# Patient Record
Sex: Female | Born: 1990 | Race: Black or African American | Hispanic: No | Marital: Single | Smoking: Former smoker
Health system: Southern US, Community
[De-identification: ages and names within clinical notes are randomized; demographics above are authoritative.]

## PROBLEM LIST (undated history)

## (undated) ENCOUNTER — Inpatient Hospital Stay: Payer: Self-pay

## (undated) DIAGNOSIS — Q2112 Patent foramen ovale: Secondary | ICD-10-CM

## (undated) DIAGNOSIS — G971 Other reaction to spinal and lumbar puncture: Secondary | ICD-10-CM

## (undated) DIAGNOSIS — Q211 Atrial septal defect, unspecified: Secondary | ICD-10-CM

## (undated) DIAGNOSIS — Q249 Congenital malformation of heart, unspecified: Secondary | ICD-10-CM

## (undated) DIAGNOSIS — K219 Gastro-esophageal reflux disease without esophagitis: Secondary | ICD-10-CM

## (undated) DIAGNOSIS — Z8679 Personal history of other diseases of the circulatory system: Secondary | ICD-10-CM

## (undated) DIAGNOSIS — F419 Anxiety disorder, unspecified: Secondary | ICD-10-CM

## (undated) DIAGNOSIS — I10 Essential (primary) hypertension: Secondary | ICD-10-CM

## (undated) DIAGNOSIS — IMO0001 Reserved for inherently not codable concepts without codable children: Secondary | ICD-10-CM

## (undated) HISTORY — DX: Patent foramen ovale: Q21.12

## (undated) HISTORY — PX: CHOLECYSTECTOMY: SHX55

## (undated) HISTORY — DX: Atrial septal defect: Q21.1

## (undated) HISTORY — PX: TUBAL LIGATION: SHX77

---

## 2013-04-30 ENCOUNTER — Emergency Department: Payer: Self-pay | Admitting: Emergency Medicine

## 2013-04-30 LAB — DRUG SCREEN, URINE
Amphetamines, Ur Screen: NEGATIVE (ref ?–1000)
Barbiturates, Ur Screen: NEGATIVE (ref ?–200)
Benzodiazepine, Ur Scrn: NEGATIVE (ref ?–200)
COCAINE METABOLITE, UR ~~LOC~~: NEGATIVE (ref ?–300)
Cannabinoid 50 Ng, Ur ~~LOC~~: POSITIVE (ref ?–50)
MDMA (Ecstasy)Ur Screen: NEGATIVE (ref ?–500)
Methadone, Ur Screen: NEGATIVE (ref ?–300)
OPIATE, UR SCREEN: NEGATIVE (ref ?–300)
Phencyclidine (PCP) Ur S: NEGATIVE (ref ?–25)
Tricyclic, Ur Screen: NEGATIVE (ref ?–1000)

## 2013-04-30 LAB — BASIC METABOLIC PANEL
ANION GAP: 7 (ref 7–16)
BUN: 13 mg/dL (ref 7–18)
CHLORIDE: 106 mmol/L (ref 98–107)
CREATININE: 1.16 mg/dL (ref 0.60–1.30)
Calcium, Total: 8.9 mg/dL (ref 8.5–10.1)
Co2: 25 mmol/L (ref 21–32)
EGFR (Non-African Amer.): >60
Glucose: 90 mg/dL (ref 65–99)
Osmolality: 275 (ref 275–301)
Potassium: 3.3 mmol/L — ABNORMAL LOW (ref 3.5–5.1)
Sodium: 138 mmol/L (ref 136–145)

## 2013-04-30 LAB — CBC
HCT: 36.4 % (ref 35.0–47.0)
HGB: 11.6 g/dL — ABNORMAL LOW (ref 12.0–16.0)
MCH: 25 pg — ABNORMAL LOW (ref 26.0–34.0)
MCHC: 31.9 g/dL — ABNORMAL LOW (ref 32.0–36.0)
MCV: 79 fL — ABNORMAL LOW (ref 80–100)
PLATELETS: 298 10*3/uL (ref 150–440)
RBC: 4.64 10*6/uL (ref 3.80–5.20)
RDW: 15.7 % — ABNORMAL HIGH (ref 11.5–14.5)
WBC: 9.3 10*3/uL (ref 3.6–11.0)

## 2013-04-30 LAB — ETHANOL: Ethanol: <3 mg/dL

## 2013-04-30 LAB — TROPONIN I

## 2013-06-13 ENCOUNTER — Emergency Department: Payer: Self-pay | Admitting: Emergency Medicine

## 2013-06-13 LAB — DRUG SCREEN, URINE

## 2013-06-23 ENCOUNTER — Emergency Department: Payer: Self-pay | Admitting: Emergency Medicine

## 2013-08-23 ENCOUNTER — Emergency Department: Payer: Self-pay | Admitting: Emergency Medicine

## 2013-08-23 LAB — WET PREP, GENITAL

## 2013-08-23 LAB — COMPREHENSIVE METABOLIC PANEL
ANION GAP: 7 (ref 7–16)
Albumin: 3.4 g/dL (ref 3.4–5.0)
Alkaline Phosphatase: 71 U/L
BUN: 13 mg/dL (ref 7–18)
Bilirubin,Total: 0.5 mg/dL (ref 0.2–1.0)
CALCIUM: 8.6 mg/dL (ref 8.5–10.1)
CREATININE: 0.67 mg/dL (ref 0.60–1.30)
Chloride: 106 mmol/L (ref 98–107)
Co2: 25 mmol/L (ref 21–32)
EGFR (Non-African Amer.): >60
Glucose: 114 mg/dL — ABNORMAL HIGH (ref 65–99)
OSMOLALITY: 277 (ref 275–301)
Potassium: 3.3 mmol/L — ABNORMAL LOW (ref 3.5–5.1)
SGOT(AST): 16 U/L (ref 15–37)
SGPT (ALT): 23 U/L (ref 12–78)
Sodium: 138 mmol/L (ref 136–145)
Total Protein: 7.5 g/dL (ref 6.4–8.2)

## 2013-08-23 LAB — URINALYSIS, COMPLETE
Bacteria: NONE SEEN
Bilirubin,UR: NEGATIVE
Blood: NEGATIVE
Glucose,UR: NEGATIVE mg/dL (ref 0–75)
Ketone: NEGATIVE
Nitrite: NEGATIVE
Ph: 5 (ref 4.5–8.0)
Protein: NEGATIVE
RBC, UR: NONE SEEN /HPF (ref 0–5)
SPECIFIC GRAVITY: 1.032 (ref 1.003–1.030)
Squamous Epithelial: 3
WBC UR: 1 /HPF (ref 0–5)

## 2013-08-23 LAB — CBC
HCT: 35.8 % (ref 35.0–47.0)
HGB: 11.7 g/dL — AB (ref 12.0–16.0)
MCH: 25.3 pg — ABNORMAL LOW (ref 26.0–34.0)
MCHC: 32.5 g/dL (ref 32.0–36.0)
MCV: 78 fL — ABNORMAL LOW (ref 80–100)
PLATELETS: 289 10*3/uL (ref 150–440)
RBC: 4.6 10*6/uL (ref 3.80–5.20)
RDW: 15.2 % — ABNORMAL HIGH (ref 11.5–14.5)
WBC: 9.4 10*3/uL (ref 3.6–11.0)

## 2013-08-23 LAB — GC/CHLAMYDIA PROBE AMP

## 2014-02-13 ENCOUNTER — Emergency Department: Payer: Self-pay | Admitting: Emergency Medicine

## 2014-04-07 ENCOUNTER — Emergency Department: Payer: Self-pay | Admitting: Emergency Medicine

## 2014-07-18 ENCOUNTER — Emergency Department
Admit: 2014-07-18 | Disposition: A | Discharge status: Home / Self Care | Payer: Self-pay | Admitting: Emergency Medicine

## 2014-07-18 LAB — URINALYSIS, COMPLETE
BILIRUBIN, UR: NEGATIVE
Blood: NEGATIVE
Glucose,UR: NEGATIVE mg/dL (ref 0–75)
Ketone: NEGATIVE
Leukocyte Esterase: NEGATIVE
NITRITE: NEGATIVE
Ph: 5 (ref 4.5–8.0)
Protein: NEGATIVE
RBC, UR: NONE SEEN /HPF (ref 0–5)
Specific Gravity: 1.017 (ref 1.003–1.030)

## 2014-07-18 LAB — CBC WITH DIFFERENTIAL/PLATELET
BASOS PCT: 0.3 %
Basophil #: 0 10*3/uL (ref 0.0–0.1)
EOS ABS: 0 10*3/uL (ref 0.0–0.7)
Eosinophil %: 0.4 %
HCT: 38.4 % (ref 35.0–47.0)
HGB: 12.5 g/dL (ref 12.0–16.0)
LYMPHS ABS: 2.4 10*3/uL (ref 1.0–3.6)
LYMPHS PCT: 25.9 %
MCH: 25.9 pg — ABNORMAL LOW (ref 26.0–34.0)
MCHC: 32.7 g/dL (ref 32.0–36.0)
MCV: 79 fL — AB (ref 80–100)
Monocyte #: 0.9 x10 3/mm (ref 0.2–0.9)
Monocyte %: 9.7 %
Neutrophil #: 5.9 10*3/uL (ref 1.4–6.5)
Neutrophil %: 63.7 %
Platelet: 275 10*3/uL (ref 150–440)
RBC: 4.84 10*6/uL (ref 3.80–5.20)
RDW: 15.6 % — ABNORMAL HIGH (ref 11.5–14.5)
WBC: 9.3 10*3/uL (ref 3.6–11.0)

## 2014-07-18 LAB — HCG, QUANTITATIVE, PREGNANCY: Beta Hcg, Quant.: 30052 m[IU]/mL — ABNORMAL HIGH

## 2014-07-21 LAB — URINE CULTURE

## 2014-07-23 ENCOUNTER — Other Ambulatory Visit: Payer: Self-pay | Admitting: Family Medicine

## 2014-07-23 DIAGNOSIS — Z369 Encounter for antenatal screening, unspecified: Secondary | ICD-10-CM

## 2014-07-23 DIAGNOSIS — I517 Cardiomegaly: Secondary | ICD-10-CM

## 2014-07-23 LAB — OB RESULTS CONSOLE VARICELLA ZOSTER ANTIBODY, IGG: VARICELLA IGG: NON-IMMUNE/NOT IMMUNE

## 2014-07-23 LAB — OB RESULTS CONSOLE HEPATITIS B SURFACE ANTIGEN: Hepatitis B Surface Ag: NEGATIVE

## 2014-07-23 LAB — OB RESULTS CONSOLE RPR: RPR: NONREACTIVE

## 2014-07-23 LAB — OB RESULTS CONSOLE RUBELLA ANTIBODY, IGM: RUBELLA: IMMUNE

## 2014-07-29 DIAGNOSIS — Z91410 Personal history of adult physical and sexual abuse: Secondary | ICD-10-CM | POA: Insufficient documentation

## 2014-08-12 ENCOUNTER — Emergency Department: Payer: Medicaid Other

## 2014-08-12 ENCOUNTER — Emergency Department
Admission: EM | Admit: 2014-08-12 | Discharge: 2014-08-12 | Disposition: A | Discharge status: Home / Self Care | Payer: Medicaid Other | Attending: Emergency Medicine | Admitting: Emergency Medicine

## 2014-08-12 DIAGNOSIS — Z3A10 10 weeks gestation of pregnancy: Secondary | ICD-10-CM | POA: Diagnosis not present

## 2014-08-12 DIAGNOSIS — O98511 Other viral diseases complicating pregnancy, first trimester: Secondary | ICD-10-CM | POA: Insufficient documentation

## 2014-08-12 DIAGNOSIS — O9989 Other specified diseases and conditions complicating pregnancy, childbirth and the puerperium: Secondary | ICD-10-CM | POA: Insufficient documentation

## 2014-08-12 DIAGNOSIS — B349 Viral infection, unspecified: Secondary | ICD-10-CM | POA: Insufficient documentation

## 2014-08-12 DIAGNOSIS — O26899 Other specified pregnancy related conditions, unspecified trimester: Secondary | ICD-10-CM

## 2014-08-12 DIAGNOSIS — R109 Unspecified abdominal pain: Secondary | ICD-10-CM

## 2014-08-12 LAB — CBC WITH DIFFERENTIAL/PLATELET
BASOS PCT: 0 %
Basophils Absolute: 0 10*3/uL (ref 0–0.1)
EOS ABS: 0 10*3/uL (ref 0–0.7)
EOS PCT: 0 %
HCT: 40.3 % (ref 35.0–47.0)
Hemoglobin: 13 g/dL (ref 12.0–16.0)
Lymphocytes Relative: 19 %
Lymphs Abs: 1.9 10*3/uL (ref 1.0–3.6)
MCH: 26 pg (ref 26.0–34.0)
MCHC: 32.3 g/dL (ref 32.0–36.0)
MCV: 80.4 fL (ref 80.0–100.0)
Monocytes Absolute: 0.8 10*3/uL (ref 0.2–0.9)
Monocytes Relative: 9 %
Neutro Abs: 7.1 10*3/uL — ABNORMAL HIGH (ref 1.4–6.5)
Neutrophils Relative %: 72 %
Platelets: 290 10*3/uL (ref 150–440)
RBC: 5.01 MIL/uL (ref 3.80–5.20)
RDW: 15.3 % — AB (ref 11.5–14.5)
WBC: 9.9 10*3/uL (ref 3.6–11.0)

## 2014-08-12 LAB — URINALYSIS COMPLETE WITH MICROSCOPIC (ARMC ONLY)
Bacteria, UA: NONE SEEN
Bilirubin Urine: NEGATIVE
GLUCOSE, UA: NEGATIVE mg/dL
Hgb urine dipstick: NEGATIVE
Ketones, ur: NEGATIVE mg/dL
Leukocytes, UA: NEGATIVE
Nitrite: NEGATIVE
PH: 5 (ref 5.0–8.0)
Protein, ur: NEGATIVE mg/dL
Specific Gravity, Urine: 1.018 (ref 1.005–1.030)

## 2014-08-12 LAB — BASIC METABOLIC PANEL
Anion gap: 8 (ref 5–15)
BUN: 6 mg/dL (ref 6–20)
CALCIUM: 9.1 mg/dL (ref 8.9–10.3)
CO2: 24 mmol/L (ref 22–32)
CREATININE: 0.56 mg/dL (ref 0.44–1.00)
Chloride: 101 mmol/L (ref 101–111)
GFR calc Af Amer: >60 mL/min (ref >60)
GLUCOSE: 99 mg/dL (ref 65–99)
POTASSIUM: 3.8 mmol/L (ref 3.5–5.1)
Sodium: 133 mmol/L — ABNORMAL LOW (ref 135–145)

## 2014-08-12 LAB — CHLAMYDIA/NGC RT PCR (ARMC ONLY)
Chlamydia Tr: NOT DETECTED
N GONORRHOEAE: NOT DETECTED

## 2014-08-12 LAB — WET PREP, GENITAL
Clue Cells Wet Prep HPF POC: NONE SEEN
Trich, Wet Prep: NONE SEEN
Yeast Wet Prep HPF POC: NONE SEEN

## 2014-08-12 LAB — HCG, QUANTITATIVE, PREGNANCY: hCG, Beta Chain, Quant, S: 121381 m[IU]/mL — ABNORMAL HIGH (ref <5)

## 2014-08-12 MED ORDER — VITAMIN B-6 50 MG PO TABS: 75.0000 mg | ORAL_TABLET | Freq: Every day | ORAL | Status: DC

## 2014-08-12 MED ORDER — ACETAMINOPHEN 325 MG PO TABS
650.0000 mg | ORAL_TABLET | Freq: Once | ORAL | Status: AC
  Administered 2014-08-12: 650 mg via ORAL

## 2014-08-12 MED ORDER — METOCLOPRAMIDE HCL 10 MG PO TABS
ORAL_TABLET | ORAL | Status: AC
  Administered 2014-08-12: 10 mg via ORAL
  Filled 2014-08-12: qty 1

## 2014-08-12 MED ORDER — METOCLOPRAMIDE HCL 10 MG PO TABS
10.0000 mg | ORAL_TABLET | Freq: Once | ORAL | Status: AC
  Administered 2014-08-12: 10 mg via ORAL
  Filled 2014-08-12: qty 1

## 2014-08-12 MED ORDER — ACETAMINOPHEN 325 MG PO TABS
ORAL_TABLET | ORAL | Status: AC
  Administered 2014-08-12: 650 mg via ORAL
  Filled 2014-08-12: qty 2

## 2014-08-12 NOTE — ED Notes (Signed)
Pt ambulatory to lobby. NAD. 

## 2014-08-12 NOTE — ED Notes (Signed)
C/o 3 days of diarrhea, is 10 weeks preg, nausea and vomiting daily with pregnancy.

## 2014-08-12 NOTE — ED Provider Notes (Signed)
[facility] Emergency Department Provider Note    ____________________________________________  Time seen: 2:14 PM.   I have reviewed the triage vital signs and the nursing notes.   HISTORY  Chief Complaint Headache and Abdominal Pain       HPI Ashley Howell is a 24 y.o. female at 7010 weeks' gestation emergency with lower abdominal pain for 3 days. She said that she has had associated nausea and vomiting. There is no blood in the vomitus. She's also had several episodes of diarrhea. She says that she has had about 3 episodes of diarrhea per day there is no recent travel history or antibiotic use. There is also no blood in the diarrhea. She describes it as watery. She describes her abdominal pain as lower, cramping and sharp and a 7 out of 10. She denies any vaginal bleeding or discharge. She denies any pain with urination. She denies any sick contacts. 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 She is also describing rhinorrhea as well as a left-sided headache which she describes as throbbing and is located frontal and left. There is associated photophobia. She denies any history of migraine. Denies ear pressure, fever or throat pain. There is nothing that makes the pain better or worse but the pain in both the head and the belly come in waves. Patient is a G3 P2.       No past medical history on file.  There are no active problems to display for this patient.   No past surgical history on file.  No current outpatient prescriptions on file.  Allergies Review of patient's allergies indicates no known allergies.  No family history on file.  Social History History  Substance Use Topics  . Smoking status: Not on file  . Smokeless tobacco: Not on file  . Alcohol Use: Not on file    Review of Systems  Constitutional: Negative for fever. Eyes: Negative for visual changes. ENT: Negative for sore throat. Positive  for rhinorrhea and frontal left-sided headache. Cardiovascular: Negative for chest pain. Respiratory: Negative for shortness of breath. Gastrointestinal: Lower bilateral abdominal pain with nausea vomiting and diarrhea.  Genitourinary: Negative for dysuria. Musculoskeletal: Low back pain bilaterally Skin: Negative for rash. Neurological: Negative for headaches, focal weakness or numbness.  10-point ROS otherwise negative.  ____________________________________________   PHYSICAL EXAM:  VITAL SIGNS: ED Triage Vitals  Enc Vitals Group     BP 08/12/14 1343 127/72 mmHg     Pulse Rate 08/12/14 1343 86     Resp 08/12/14 1343 18     Temp 08/12/14 1343 98.2 F (36.8 C)     Temp Source 08/12/14 1343 Oral     SpO2 08/12/14 1343 99 %     Weight 08/12/14 1343 230 lb (104.327 kg)     Height 08/12/14 1343 5\' 7"  (1.702 m)     Head Cir --      Peak Flow --      Pain Score 08/12/14 1343 7     Pain Loc --      Pain Edu? --      Excl. in GC? --      Constitutional: Alert and oriented. Well appearing and in no distress. Eyes: Conjunctivae are normal. PERRL. Normal extraocular movements. ENT   Head: Normocephalic and atraumatic. No tenderness to palpation over the sinuses.   Nose: With bilateral erythema to the nasal mucosa with a mild amount of rhinorrhea.   Mouth/Throat: Mucous  membranes are moist.   Neck: No stridor. Hematological/Lymphatic/Immunilogical: No cervical lymphadenopathy. Cardiovascular: Normal rate, regular rhythm. Normal and symmetric distal pulses are present in all extremities. No murmurs, rubs, or gallops. Respiratory: Normal respiratory effort without tachypnea nor retractions. Breath sounds are clear and equal bilaterally. No wheezes/rales/rhonchi. Gastrointestinal: No distention. No abdominal bruits. There is no CVA tenderness. Mild tenderness to the lower abdomen suprapubic and bilaterally. There is no rebound or guarding. The belly is soft. Genitourinary:  There is a normal external exam. Very scant cervical mucus on the speculum exam. Bimanual exam with a closed cervix, no CMT, no uterine or adnexal tenderness bilaterally or masses bilaterally. Musculoskeletal: Nontender with normal range of motion in all extremities. No joint effusions.  No lower extremity tenderness nor edema. Mild tenderness to the lower lumbar area diffusely. No step-off or deformity to the spine. Neurologic:  Normal speech and language. No gross focal neurologic deficits are appreciated. Speech is normal. No gait instability. Skin:  Skin is warm, dry and intact. No rash noted. Psychiatric: Mood and affect are normal. Speech and behavior are normal. Patient exhibits appropriate insight and judgment.  ____________________________________________    LABS (pertinent positives/negatives)  The patient has a normal white count. This is reassuring for appendicitis. No sign of UTI on UA.  ____________________________________________   EKG    ____________________________________________    RADIOLOGY  ED ultrasound with IUP single and live.    ____________________________________________   PROCEDURES    ____________________________________________   INITIAL IMPRESSION / ASSESSMENT AND PLAN / ED COURSE  Pertinent labs & imaging results that were available during my care of the patient were reviewed by me and considered in my medical decision making (see chart for details).  We'll review patient's ultrasound and vaginal swab. Impression is that the patient has a viral syndrome with sinus pressure and congestion causing her headache. Patient also says that her vomiting has been consistent since the beginning of her pregnancy. It is likely there is some component of the viral syndrome concerning to her vomiting. However, I definitely feel of the viral syndrome is concerning to her diarrhea. Although there is no obvious sick contact, the patient has multiple risk  factors including small children at home as well as work in healthcare setting. Doubt that the patient has flu given no fever or diffuse body aches. Patient is also outside the window for Tamiflu. Doubt appendicitis given the patient has bilateral lower abdominal pain with a normal white count.  ----------------------------------------- 4:23 PM on 08/12/2014 -----------------------------------------  Patient's symptoms are improved with medication. Patient has an appointment to follow up with [facility]. Will DC home with vitamin B6 for nausea and vomiting. The patient was counseled to continue her prenatal vitamins. She was also counseled to use Tylenol\acetaminophen and only Tylenol/acetaminophen for pain.  I believe that this constellation of symptoms were present a viral syndrome. The patient will be discharged to home.  ____________________________________________   FINAL CLINICAL IMPRESSION(S) / ED DIAGNOSES  Viral syndrome. Abdominal pain and pregnancy. Acute common initial visit.   Arelia Longestavid M Schaevitz, MD 08/12/14 [phone]

## 2014-08-12 NOTE — Discharge Instructions (Signed)

## 2014-08-26 ENCOUNTER — Ambulatory Visit (HOSPITAL_BASED_OUTPATIENT_CLINIC_OR_DEPARTMENT_OTHER)
Admission: RE | Admit: 2014-08-26 | Discharge: 2014-08-26 | Disposition: A | Discharge status: Home / Self Care | Payer: Medicaid Other | Source: Ambulatory Visit | Attending: Obstetrics and Gynecology | Admitting: Obstetrics and Gynecology

## 2014-08-26 ENCOUNTER — Other Ambulatory Visit: Payer: Self-pay | Admitting: Obstetrics and Gynecology

## 2014-08-26 ENCOUNTER — Ambulatory Visit
Admission: RE | Admit: 2014-08-26 | Discharge: 2014-08-26 | Disposition: A | Discharge status: Home / Self Care | Payer: Medicaid Other | Source: Ambulatory Visit | Attending: Family Medicine | Admitting: Family Medicine

## 2014-08-26 VITALS — BP 127/67 | HR 84 | Temp 98.6°F | Ht 67.0 in | Wt 253.0 lb

## 2014-08-26 DIAGNOSIS — O99211 Obesity complicating pregnancy, first trimester: Secondary | ICD-10-CM

## 2014-08-26 DIAGNOSIS — Z36 Encounter for antenatal screening of mother: Secondary | ICD-10-CM | POA: Diagnosis present

## 2014-08-26 DIAGNOSIS — Z3A12 12 weeks gestation of pregnancy: Secondary | ICD-10-CM | POA: Diagnosis not present

## 2014-08-26 DIAGNOSIS — Z369 Encounter for antenatal screening, unspecified: Secondary | ICD-10-CM

## 2014-08-26 NOTE — Progress Notes (Signed)
Clinic, General Medical Length of Consultation: 40 minutes   [name]  was referred to [facility] for genetic counseling to review prenatal screening and testing options.  This note summarizes the information we discussed.    First trimester screening, which can include nuchal translucency ultrasound screen and/or first trimester maternal serum marker screening.  The nuchal translucency has approximately an 80% detection rate for Down syndrome and can be positive for other chromosome abnormalities as well as congenital heart defects.  When combined with a maternal serum marker screening, the detection rate is up to 90% for Down syndrome and up to 97% for trisomy 18.     Maternal serum marker screening, a blood test that measures pregnancy proteins, can provide risk assessments for Down syndrome, trisomy 18, and open neural tube defects (spina bifida, anencephaly). Because it does not directly examine the fetus, it cannot positively diagnose or rule out these problems.  Targeted ultrasound uses high frequency sound waves to create an image of the developing fetus.  An ultrasound is often recommended as a routine means of evaluating the pregnancy.  It is also used to screen for fetal anatomy problems (for example, a heart defect) that might be suggestive of a chromosomal or other abnormality.   Should these screening tests indicate an increased concern, then the following diagnostic options would be offered:  The chorionic villus sampling procedure is available for first trimester chromosome analysis.  This involves the withdrawal of a small amount of chorionic villi (tissue from the developing placenta).  Risk of pregnancy loss is estimated to be approximately 1 in 200 to 1 in 100 (0.5 to 1%).  There is approximately a 1% (1 in 100) chance that the CVS chromosome results will be unclear.  Chorionic villi cannot be tested for neural tube defects.     Amniocentesis involves the  removal of a small amount of amniotic fluid from the sac surrounding the fetus with the use of a thin needle inserted through the maternal abdomen and uterus.  Ultrasound guidance is used throughout the procedure.  Fetal cells from amniotic fluid are directly evaluated and > 99.5% of chromosome problems and > 98% of open neural tube defects can be detected. This procedure is generally performed after the 15th week of pregnancy.  The main risks to this procedure include complications leading to miscarriage in less than 1 in 200 cases (0.5%).  As another option for information if the pregnancy is suspected to be an an increased chance for certain chromosome conditions, we also reviewed the availability of cell free fetal DNA testing from maternal blood to determine whether or not the baby may have either Down syndrome, trisomy 5413, or trisomy 6518.  This test utilizes a maternal blood sample and DNA sequencing technology to isolate circulating cell free fetal DNA from maternal plasma.  The fetal DNA can then be analyzed for DNA sequences that are derived from the three most common chromosomes involved in aneuploidy, chromosomes 13, 18, and 21.  If the overall amount of DNA is greater than the expected level for any of these chromosomes, aneuploidy is suspected.  While we do not consider it a replacement for invasive testing and karyotype analysis, a negative result from this testing would be reassuring, though not a guarantee of a normal chromosome complement for the baby.  An abnormal result is certainly suggestive of an abnormal chromosome complement, though we would still recommend CVS or amniocentesis to confirm any findings from this testing.  We obtained a detailed family history and pregnancy history.  [name] has paternal first cousins who has trouble with recurrent pregnancy losses.  We discussed that there may be many reasons for pregnany loss, including some that may be inherited.  However, we would  need more information about the cause for their miscarriages to assess the concern for other family members.  It is encouraging that [name] has not had any losses.  The remainder of the family history was reported to be unremarkable for birth defects, mental retardation, recurrent pregnancy loss or known chromosome abnormalities.  The patient was also referred for an MFM consultation due to a history of enlarged heart in her prior pregnancies, however, no records were yet available at the time of her visit. She stated that her heart was said to be enlarged during pregnancy but returned to normal after delivery.  She also recalled being told at the time of her heart evaluation in her last pregnancy that her condition was not life threatening and that she was never told she should avoid pregnancy in the future.   So, this consultation was deferred until she returns for her anatomy ultrasound in 6 weeks at which time we expect records will be available from the [facility].    [name] stated that this is her fourth pregnancy, the first with this partner.  She has two healthy daughters, ages 514 and 765.  In the current pregnancy, she reported no complications or exposures that would be expected to increase the risk for birth defects.    After consideration of the options, [name] elected to proceed with first trimester screening.  An ultrasound was performed at the time of the visit.  The gestational age was consistent with 12 weeks.  Fetal anatomy could not be assessed due to early gestational age.  Please refer to the ultrasound report for details of that study.  [name] was encouraged to call with questions or concerns.  We can be contacted at [phone].

## 2014-08-27 DIAGNOSIS — Z36 Encounter for antenatal screening of mother: Secondary | ICD-10-CM | POA: Diagnosis not present

## 2014-08-27 DIAGNOSIS — Z369 Encounter for antenatal screening, unspecified: Secondary | ICD-10-CM | POA: Insufficient documentation

## 2014-08-27 NOTE — Progress Notes (Signed)
Ashley Wells, MS, CGC performed an integral service incident to the physician's initial service.  I was physically present in the clinical area and was immediately available to render assistance.   Lititia Sen C Miasha Emmons  

## 2014-08-30 ENCOUNTER — Telehealth: Payer: Self-pay | Admitting: Obstetrics and Gynecology

## 2014-08-30 NOTE — Telephone Encounter (Signed)
  [name] elected to undergo First Trimester screening on Aug 26, 2014.  To review, first trimester screening, includes nuchal translucency ultrasound screen and/or first trimester maternal serum marker screening.  The nuchal translucency has approximately an 80% detection rate for Down syndrome and can be positive for other chromosome abnormalities as well as heart defects.  When combined with a maternal serum marker screening, the detection rate is up to 90% for Down syndrome and up to 97% for trisomy 13 and 18.     The results of the First Trimester Nuchal Translucency and Biochemical Screening were within normal range.  The risk for Down syndrome is now estimated to be less than 1 in 10,000.  The risk for Trisomy 13/18 is also estimated to be less than 1 in 10,000.  Should more definitive information be desired, we would offer amniocentesis.  Because we do not yet know the effectiveness of combined first and second trimester screening, we do not recommend a maternal serum screen to assess the chance for chromosome conditions.  However, if screening for neural tube defects is desired, maternal serum screening for AFP only can be performed between 15 and [redacted] weeks gestation.

## 2014-09-21 ENCOUNTER — Emergency Department: Payer: Medicaid Other

## 2014-09-21 ENCOUNTER — Emergency Department
Admission: EM | Admit: 2014-09-21 | Discharge: 2014-09-21 | Disposition: A | Discharge status: Home / Self Care | Payer: Medicaid Other | Attending: Emergency Medicine | Admitting: Emergency Medicine

## 2014-09-21 ENCOUNTER — Encounter: Payer: Self-pay | Admitting: Emergency Medicine

## 2014-09-21 DIAGNOSIS — O26899 Other specified pregnancy related conditions, unspecified trimester: Secondary | ICD-10-CM

## 2014-09-21 DIAGNOSIS — Z79899 Other long term (current) drug therapy: Secondary | ICD-10-CM | POA: Diagnosis not present

## 2014-09-21 DIAGNOSIS — O9A212 Injury, poisoning and certain other consequences of external causes complicating pregnancy, second trimester: Secondary | ICD-10-CM | POA: Diagnosis present

## 2014-09-21 DIAGNOSIS — S3991XA Unspecified injury of abdomen, initial encounter: Secondary | ICD-10-CM | POA: Diagnosis not present

## 2014-09-21 DIAGNOSIS — Y9289 Other specified places as the place of occurrence of the external cause: Secondary | ICD-10-CM | POA: Insufficient documentation

## 2014-09-21 DIAGNOSIS — Y998 Other external cause status: Secondary | ICD-10-CM | POA: Diagnosis not present

## 2014-09-21 DIAGNOSIS — Y9389 Activity, other specified: Secondary | ICD-10-CM | POA: Diagnosis not present

## 2014-09-21 DIAGNOSIS — Z3A16 16 weeks gestation of pregnancy: Secondary | ICD-10-CM | POA: Diagnosis not present

## 2014-09-21 DIAGNOSIS — R109 Unspecified abdominal pain: Secondary | ICD-10-CM

## 2014-09-21 HISTORY — DX: Anxiety disorder, unspecified: F41.9
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
## 2014-09-21 NOTE — ED Notes (Signed)
Patient transported to Ultrasound 

## 2014-09-21 NOTE — Discharge Instructions (Signed)
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
## 2014-10-07 ENCOUNTER — Ambulatory Visit (HOSPITAL_BASED_OUTPATIENT_CLINIC_OR_DEPARTMENT_OTHER)
Admission: RE | Admit: 2014-10-07 | Discharge: 2014-10-07 | Disposition: A | Discharge status: Home / Self Care | Payer: Medicaid Other | Source: Ambulatory Visit | Attending: Maternal & Fetal Medicine | Admitting: Maternal & Fetal Medicine

## 2014-10-07 ENCOUNTER — Ambulatory Visit
Admission: RE | Admit: 2014-10-07 | Discharge: 2014-10-07 | Disposition: A | Discharge status: Home / Self Care | Payer: Medicaid Other | Source: Ambulatory Visit | Attending: Maternal & Fetal Medicine | Admitting: Maternal & Fetal Medicine

## 2014-10-07 DIAGNOSIS — IMO0002 Reserved for concepts with insufficient information to code with codable children: Secondary | ICD-10-CM

## 2014-10-07 DIAGNOSIS — Z3A18 18 weeks gestation of pregnancy: Secondary | ICD-10-CM | POA: Diagnosis not present

## 2014-10-07 DIAGNOSIS — Z36 Encounter for antenatal screening of mother: Secondary | ICD-10-CM

## 2014-10-07 DIAGNOSIS — O99211 Obesity complicating pregnancy, first trimester: Secondary | ICD-10-CM

## 2014-10-07 DIAGNOSIS — Z0489 Encounter for examination and observation for other specified reasons: Secondary | ICD-10-CM

## 2014-10-07 NOTE — Progress Notes (Signed)
MFM Consultation  24 yo [id number] at 18/[redacted] weeks gestation by ultrasound performed at [facility] on 08/26/14; measurements were consistent with 12 weeks 3 days.  She had first trimester screening that demonstrated a Trisomy 21 risk of 1 in 9110, 000 and a Trisomy 13/18 risk of 1 in 4810, 000.  She is here for consultation due to her report of 'enlarged heart'. She reports being told she had an 'enlarged heart' during her first pregnancy. When questioned further, she reports the finding may actually have been a 'murmur'.  She had an evaluation by a cardiologist and underwent ECG and Echocardiogram.  She reports normal postpartum recovery for her first and second pregnancies. She underwent cesarean delivery with her first child (nonreassuring fetal heart rate) and repeat cesarean section with her second. She reports no history of exercise intolerance or difficulty with activities like stair climbing.  Her records from both hospitalizations were reviewed and are unremarkable.  The outpatient Obgyn records were not available (but were requested today).    Past Medical History  Diagnosis Date  . Anxiety    Past Surgical History  Procedure Laterality Date  . Cesarean section     Obstetric history C/s 2010, female 6lb 3 oz, [place], fetal nonreassuring heart rate C/s (repeat) 2012, female, 7lb 3 oz  Social History she is a former smoker and denies alcohol or ilicit drug use. She works as a Water engineerhome health aide.  Family History No family history of cardiac disease  Filed Vitals:   10/07/14 1012  BP: 131/72  Pulse: 93  Temp: 98.2 F (36.8 C)    Exam  Cor: RRR no mgr, extrem no edema  1. Patient reports history of 'enlarged heart'.  On further questioning, she reports she may have been told she had a murmur.  We have requested her outpatient records [place] Dr. [place]ley [place].  She reports having both ECG and Echocardiograms performed. If results are not available, I would  recommend repeating the maternal echocardiogram.  Given her history of normal postpartum recovery and no recommended cardiology or internal medicine follow up, it is unlikely that she had cardiomyopathy --we have scheduled a follow up consultation to review records in ~3 weeks (she is also scheduled to complete the detailed anatomic survey at the same visit)  2. BMI >40--we addressed weight gain recommendations of 10-15lbs and risks for conditions such as gestational diabetes and preeclampsia.   I recommended she begin a daily baby aspirin for preeclampsia risk reduction I also recommend she have a follow up growth ultrasound at ~28 weeks and 32-[redacted] weeks gestation  (please schedule where convenient) Our general practice is to offer weekly antenatal testing at 36 weeks for patients with bmi >40. Please obtain baseline cmp, p:c ratio and TSH if not already performed.   3. Previous c/s x 2--she desires repeat cesarean delivery.   4. Gen: the detailed anatomic survey was limited today and she will return in 3 weeks to complete the exam. The fetal spine views were unremarkable today.  Please offer MsAFP only if further screening for neural tube defects is desired.    Time spent in consultation: 30min

## 2014-10-07 NOTE — Progress Notes (Signed)
Ultrasound report (printer not functioning) Indication: Anatomy.  ____________________________________________________________________________ History: Age: 24 years. ____________________________________________________________________________ Dating: LMP: 05/31/2014 EDC: 03/07/2015 GA by LMP: 554w3d Earlier Assessment on: 08/26/2014 EDC: 03/07/2015 GA by earlier assessment: 684w3d Best Overall Assessment: 10/07/2014 EDC: 03/07/2015 Assessed GA: 494w3d The Best Overall Assessment is based on an earlier assessment on 08/26/2014. ____________________________________________________________________________ Anatomy Scan: Singleton gestation. Biometry: BPD 38.7 mm  - 218w5d (4184w3d to 884w1d) HC 147.9 mm  - 8594w6d (2949w5d to 8551w1d) AC 127.0 mm  - 6131w2d (7345w4d to 5251w0d) FL 28.7 mm  - 6424w6d (7358w0d to 3476w4d) HUM 27.9 mm  - 7551w0d   Fetal heart activity: present. Fetal heart rate: 141 bpm.  Fetal presentation: cephalic.  Amniotic fluid: normal.  Cord: normal.  Placenta: anterior.   Fetal Anatomy: Visualized with normal appearance: gastrointestinal tract, kidneys, bladder.  Head: Appears normal.  Brain: Appears normal.  Face: Suboptimally visualized.  Spine: Appears normal Neck / Skin: Appears normal.  Thorax: Appears normal.  Heart: Suboptimally visualized.  Abdominal Wall: Appears normal.  Extremities: Appears normal.  Skeleton: Appears normal.  ____________________________________________________________________________ Maternal Structures: Uterus and adnexa normal. Cervical length 40 mm. ____________________________________________________________________________ Report Summary: Impression: Single intrauterine pregnancy with an estimated gestational age of [redacted] weeks 3 day(s).  Dating assigned by LMP concordant with [facility]; measurements were consistent with 12 weeks 3 days.  Detailed anatomic survey performed due to elevated BMI.  She  also had an MFM consultation due to maternal report of an enlarged heart in her first pregnancy (she has no clinical symptoms consistent with cardiac disase and had two, term uncomplicated cesarean delivery.  Records from her hospitalization were reviewed, however, the outpatient clinic notes with ECG and Echo reports were not available.  These records have been requested from her [place] ob [place] in [place].    Fetal biometry is consistent with the gestational age. Due fetal position and habitus,  images of the fetal heart, corpus callosum, abdominal cord insertion, and genitalia (she does not want to know the fetal gender but knows we will image gender as part of the detailed anatomic survey)   Recommend follow up ultrasound in 3 weeks and follow up consultation to review outpatient records [place]. These appointment have been scheduled. CODING DESCRIPTION: elevated bmi.  Recommendations:   Thank you for allowing us to participate in her care.

## 2014-10-28 ENCOUNTER — Ambulatory Visit
Admission: RE | Admit: 2014-10-28 | Discharge: 2014-10-28 | Disposition: A | Discharge status: Home / Self Care | Payer: Medicaid Other | Source: Ambulatory Visit | Attending: Maternal & Fetal Medicine | Admitting: Maternal & Fetal Medicine

## 2014-10-28 ENCOUNTER — Other Ambulatory Visit: Payer: Self-pay | Admitting: Maternal & Fetal Medicine

## 2014-10-28 ENCOUNTER — Telehealth: Payer: Self-pay

## 2014-10-28 VITALS — BP 137/77 | HR 95 | Temp 98.4°F | Ht 68.4 in | Wt 267.2 lb

## 2014-10-28 DIAGNOSIS — Z8679 Personal history of other diseases of the circulatory system: Secondary | ICD-10-CM

## 2014-10-28 DIAGNOSIS — O99212 Obesity complicating pregnancy, second trimester: Secondary | ICD-10-CM | POA: Insufficient documentation

## 2014-10-28 DIAGNOSIS — Z3A21 21 weeks gestation of pregnancy: Secondary | ICD-10-CM | POA: Diagnosis not present

## 2014-10-28 DIAGNOSIS — O99210 Obesity complicating pregnancy, unspecified trimester: Secondary | ICD-10-CM

## 2014-10-28 DIAGNOSIS — E669 Obesity, unspecified: Secondary | ICD-10-CM | POA: Diagnosis not present

## 2014-10-28 LAB — US OB FOLLOW UP

## 2014-10-28 NOTE — Telephone Encounter (Signed)
Notified patient of her Echo and EKG scheduled for 11-02-14 @ 1000 and 1100.  Patient instructed to arrive at 0945.  Patient also informed of scheduled appointment for Follow up US on 12-16-14 @ 0900.

## 2014-11-02 ENCOUNTER — Other Ambulatory Visit: Payer: Medicaid Other

## 2014-11-02 ENCOUNTER — Ambulatory Visit: Payer: Medicaid Other | Attending: Maternal & Fetal Medicine

## 2014-11-12 ENCOUNTER — Observation Stay
Admission: EM | Admit: 2014-11-12 | Discharge: 2014-11-12 | Disposition: A | Discharge status: Home / Self Care | Payer: Medicaid Other | Attending: Obstetrics & Gynecology | Admitting: Obstetrics & Gynecology

## 2014-11-12 DIAGNOSIS — R0602 Shortness of breath: Secondary | ICD-10-CM | POA: Diagnosis present

## 2014-11-12 DIAGNOSIS — R42 Dizziness and giddiness: Secondary | ICD-10-CM | POA: Diagnosis present

## 2014-11-12 DIAGNOSIS — O368190 Decreased fetal movements, unspecified trimester, not applicable or unspecified: Secondary | ICD-10-CM | POA: Diagnosis present

## 2014-11-12 DIAGNOSIS — Z3A23 23 weeks gestation of pregnancy: Secondary | ICD-10-CM | POA: Insufficient documentation

## 2014-11-12 DIAGNOSIS — O26892 Other specified pregnancy related conditions, second trimester: Secondary | ICD-10-CM | POA: Diagnosis not present

## 2014-11-12 LAB — URINALYSIS COMPLETE WITH MICROSCOPIC (ARMC ONLY)
Bacteria, UA: NONE SEEN
Bilirubin Urine: NEGATIVE
GLUCOSE, UA: NEGATIVE mg/dL
Hgb urine dipstick: NEGATIVE
NITRITE: NEGATIVE
PROTEIN: NEGATIVE mg/dL
SPECIFIC GRAVITY, URINE: 1.023 (ref 1.005–1.030)
pH: 5 (ref 5.0–8.0)

## 2014-11-12 LAB — URINE DRUG SCREEN, QUALITATIVE (ARMC ONLY)
AMPHETAMINES, UR SCREEN: NOT DETECTED
BARBITURATES, UR SCREEN: NOT DETECTED
BENZODIAZEPINE, UR SCRN: NOT DETECTED
Cannabinoid 50 Ng, Ur ~~LOC~~: NOT DETECTED
Cocaine Metabolite,Ur ~~LOC~~: NOT DETECTED
MDMA (ECSTASY) UR SCREEN: NOT DETECTED
Methadone Scn, Ur: NOT DETECTED
Opiate, Ur Screen: NOT DETECTED
Phencyclidine (PCP) Ur S: NOT DETECTED
Tricyclic, Ur Screen: NOT DETECTED

## 2014-11-12 NOTE — OB Triage Note (Signed)
prt arrives to [facility] with c/o cramping, SOB, dizziness, generally "feeling uncomfortable", states this started 2 days ago, has not seen [facility] about this complaint, does not remember when her last visit to [facility] occurred. Asked pt how often cramping occurs, she stated "about every week, as I get bigger". Reports +FM.

## 2014-11-12 NOTE — Progress Notes (Signed)
L&D triage note  24 year old G4 P2012 with EDC=03/07/2015 presents at 19 wk 4day gestation with c/o lightheadedness, stuffy nose, and SOB x 2 days. Also c/o intermittent cramping in lower abdomen, itchy ears, swell in the maxillary area, sore throat, sneezing, and rhinorrhea. Denies dysuria, vaginal bleeding. No hx of asthma. Former smoker. Prenatal care at [facility] in consultation with [facility]. EDC by LMP and confirmed with a 12 week ultrasound. Has a history of prior Cesarean sections x 2, BMI=40, and concerns for a possible cardiomyopathy with prior pregnancies. Records from [place] were requested by [facility]. A cardiology consult was scheduled, but she was not able to go to that appt and needs it rescheduled.  Exam: BP 132/79 mmHg  Pulse 99  SpO2 99%  LMP 05/31/2014 (Exact Date) Temp 98.2 General: sounds like she has nasal congestion when she speaks, right maxillary area appears swollen Nose: nasal mucosa swollen and inflammed Ears: difficulty seeing right TM, left TM with cone reflex, no erythema Sinus tenderness on right frontal and right maxillary sinuses. Eyes: No inflammation of conjunctiva Throat: inflammed, enlarged tonsils Lungs CTA all fields FHR: 130s No ctxs seen with toco UA: 1.023 sp gravity, trace leuks but 0-5 WBC, +1 ketones, neg nitrites, no bacteria  A: IUP at 23 4/7 weeks with possible strep, possible sinusitis.  P: Throat culture pending Start Augmentin 500 mgm BID x 10 days Zyrtec 10 mgm daily prn NS nasal spray prn FU with [facility] next week as scheduled and reschedule cardiology consult.  Farrel Conners, CNM

## 2014-11-15 LAB — CULTURE, GROUP A STREP (THRC)

## 2014-11-22 ENCOUNTER — Ambulatory Visit: Payer: Medicaid Other

## 2014-11-22 ENCOUNTER — Ambulatory Visit: Payer: Medicaid Other | Attending: Family Medicine

## 2014-12-08 ENCOUNTER — Observation Stay
Admission: EM | Admit: 2014-12-08 | Discharge: 2014-12-08 | Disposition: A | Discharge status: Home / Self Care | Payer: Medicaid Other | Attending: Obstetrics and Gynecology | Admitting: Obstetrics and Gynecology

## 2014-12-08 ENCOUNTER — Encounter: Payer: Self-pay | Admitting: *Deleted

## 2014-12-08 DIAGNOSIS — N9489 Other specified conditions associated with female genital organs and menstrual cycle: Secondary | ICD-10-CM | POA: Diagnosis not present

## 2014-12-08 DIAGNOSIS — Z87891 Personal history of nicotine dependence: Secondary | ICD-10-CM | POA: Insufficient documentation

## 2014-12-08 DIAGNOSIS — R109 Unspecified abdominal pain: Secondary | ICD-10-CM | POA: Diagnosis present

## 2014-12-08 DIAGNOSIS — O26892 Other specified pregnancy related conditions, second trimester: Principal | ICD-10-CM | POA: Insufficient documentation

## 2014-12-08 DIAGNOSIS — O26899 Other specified pregnancy related conditions, unspecified trimester: Secondary | ICD-10-CM

## 2014-12-08 DIAGNOSIS — Z3A27 27 weeks gestation of pregnancy: Secondary | ICD-10-CM | POA: Insufficient documentation

## 2014-12-08 LAB — URINALYSIS COMPLETE WITH MICROSCOPIC (ARMC ONLY)
BACTERIA UA: NONE SEEN
BILIRUBIN URINE: NEGATIVE
GLUCOSE, UA: NEGATIVE mg/dL
Hgb urine dipstick: NEGATIVE
Ketones, ur: NEGATIVE mg/dL
Leukocytes, UA: NEGATIVE
Nitrite: NEGATIVE
PH: 6 (ref 5.0–8.0)
Protein, ur: NEGATIVE mg/dL
SPECIFIC GRAVITY, URINE: 1.019 (ref 1.005–1.030)

## 2014-12-08 MED ORDER — ACETAMINOPHEN 325 MG PO TABS: 650.0000 mg | ORAL_TABLET | ORAL | Status: DC | PRN

## 2014-12-08 NOTE — Discharge Instructions (Signed)
Drink plenty of fluid and get plenty of rest, call your provider for any other concerns °

## 2014-12-08 NOTE — H&P (Signed)
Obstetric H&P   Chief Complaint: Cramping  Prenatal Care Provider: [facility]  History of Present Illness: 24 y.o. [id number] at [redacted]w[redacted]d by 03/07/2015, by Last Menstrual Period presenting to L&D after having intercourse this afternoon at 12:00, falling asleep, then waking up around 14:00 with cramping.  No LOF, no VB,  +FM.   Her past pregnancy history is noteable for two C-section  Review of Systems: 10 point review of systems negative unless otherwise noted in HPI  Past Medical History: Past Medical History  Diagnosis Date  . Anxiety     Past Surgical History: Past Surgical History  Procedure Laterality Date  . Cesarean section     Family History: No family history on file.  Social History: Social History   Social History  . Marital Status: Single    Spouse Name: N/A  . Number of Children: N/A  . Years of Education: N/A   Occupational History  . Not on file.   Social History Main Topics  . Smoking status: Former Games developer  . Smokeless tobacco: Never Used  . Alcohol Use: No  . Drug Use: No  . Sexual Activity: Yes   Other Topics Concern  . Not on file   Social History Narrative    Medications: Prior to Admission medications   Medication Sig Start Date End Date Taking? Authorizing Provider  Prenatal Vit-Fe Fumarate-FA (MULTIVITAMIN-PRENATAL) 27-0.8 MG TABS tablet Take 1 tablet by mouth daily at 12 noon.    Historical Provider, MD  pyridOXINE (VITAMIN B-6) 50 MG tablet Take 1.5 tablets (75 mg total) by mouth daily. Patient not taking: Reported on 08/26/2014 08/12/14   Myrna Blazer, MD    Allergies: No Known Allergies  Physical Exam: Vitals: Blood pressure 135/63, pulse 92, last menstrual period 05/31/2014, SpO2 100 %.    FHT: 130, moderate, no accels, no decels Toco: none  General: NAD HEENT: normocephalic, anicteric Pulmonary: no increased work of breathing Abdomen: Gravid, soft, non-tender Extremities: no edema  Labs: Results for orders placed  or performed during the hospital encounter of 12/08/14 (from the past 24 hour(s))  Urinalysis complete, with microscopic ([facility] only)     Status: Abnormal   Collection Time: 12/08/14 10:11 AM  Result Value Ref Range   Color, Urine YELLOW (A) YELLOW   APPearance CLEAR (A) CLEAR   Glucose, UA NEGATIVE NEGATIVE mg/dL   Bilirubin Urine NEGATIVE NEGATIVE   Ketones, ur NEGATIVE NEGATIVE mg/dL   Specific Gravity, Urine 1.019 1.005 - 1.030   Hgb urine dipstick NEGATIVE NEGATIVE   pH 6.0 5.0 - 8.0   Protein, ur NEGATIVE NEGATIVE mg/dL   Nitrite NEGATIVE NEGATIVE   Leukocytes, UA NEGATIVE NEGATIVE   RBC / HPF 0-5 0 - 5 RBC/hpf   WBC, UA 0-5 0 - 5 WBC/hpf   Bacteria, UA NONE SEEN NONE SEEN   Squamous Epithelial / LPF 0-5 (A) NONE SEEN   Mucous PRESENT     Assessment: 24 y.o. [id number] [redacted]w[redacted]d by 03/07/2015, with postcoital cramping  Plan: 1) Cramping - no evidence of contractions, discussed postcoital cramping and given routine labor precautions  2) Fetus - reassuring monitoring for 27 weeks  3) Disposition - discharge home has follow up [facility] on 9/8

## 2014-12-08 NOTE — OB Triage Note (Signed)
Patient states she had sexual intercourse around 1200 and then took a nap and work up around 1500 cramping. Denies any bleeding or LOF

## 2014-12-08 NOTE — Discharge Summary (Signed)
Patient discharged home, discharge instructions given, patient states understanding. Patient left floor in stable condition, denies any other needs at this time. Patient to keep next scheduled OB appointment 

## 2014-12-16 ENCOUNTER — Ambulatory Visit
Admission: RE | Admit: 2014-12-16 | Discharge: 2014-12-16 | Disposition: A | Discharge status: Home / Self Care | Payer: Medicaid Other | Source: Ambulatory Visit | Attending: Obstetrics and Gynecology | Admitting: Obstetrics and Gynecology

## 2014-12-16 DIAGNOSIS — O99213 Obesity complicating pregnancy, third trimester: Secondary | ICD-10-CM | POA: Diagnosis not present

## 2014-12-16 DIAGNOSIS — Z3A28 28 weeks gestation of pregnancy: Secondary | ICD-10-CM | POA: Diagnosis not present

## 2014-12-16 DIAGNOSIS — O99210 Obesity complicating pregnancy, unspecified trimester: Secondary | ICD-10-CM | POA: Diagnosis present

## 2014-12-23 ENCOUNTER — Ambulatory Visit
Admission: RE | Admit: 2014-12-23 | Discharge: 2014-12-23 | Disposition: A | Discharge status: Home / Self Care | Payer: Medicaid Other | Source: Ambulatory Visit | Attending: Maternal & Fetal Medicine | Admitting: Maternal & Fetal Medicine

## 2014-12-23 DIAGNOSIS — Z6841 Body Mass Index (BMI) 40.0 and over, adult: Secondary | ICD-10-CM | POA: Insufficient documentation

## 2014-12-23 DIAGNOSIS — O99210 Obesity complicating pregnancy, unspecified trimester: Secondary | ICD-10-CM | POA: Insufficient documentation

## 2014-12-23 NOTE — Progress Notes (Signed)
*  PRELIMINARY RESULTS* Echocardiogram 2D Echocardiogram has been performed.  Georgann Housekeeper Hege 12/23/2014, 11:51 AM

## 2015-01-03 ENCOUNTER — Ambulatory Visit
Admission: RE | Admit: 2015-01-03 | Discharge: 2015-01-03 | Disposition: A | Discharge status: Home / Self Care | Payer: Medicaid Other | Source: Ambulatory Visit | Attending: Obstetrics and Gynecology | Admitting: Obstetrics and Gynecology

## 2015-01-03 VITALS — BP 122/72 | HR 104 | Temp 98.6°F | Wt 277.0 lb

## 2015-01-03 DIAGNOSIS — E669 Obesity, unspecified: Secondary | ICD-10-CM

## 2015-01-03 DIAGNOSIS — Z3A31 31 weeks gestation of pregnancy: Secondary | ICD-10-CM | POA: Diagnosis not present

## 2015-01-03 DIAGNOSIS — O99213 Obesity complicating pregnancy, third trimester: Secondary | ICD-10-CM

## 2015-01-03 DIAGNOSIS — Q249 Congenital malformation of heart, unspecified: Secondary | ICD-10-CM

## 2015-01-03 DIAGNOSIS — O99413 Diseases of the circulatory system complicating pregnancy, third trimester: Secondary | ICD-10-CM | POA: Diagnosis not present

## 2015-01-03 NOTE — Progress Notes (Signed)
[facility] Consultation   Chief Complaint: follow up MFM consult (October 07, 2014)  HPI: [name] is a 24 y.o. G3P2 at [redacted]w[redacted]d by L=12wk [place] who presents in consultation from the HD for history of obesity and concern for abnormal heart (versus murmur) noted during her prior pregnancy in [place].  Records were received and reviewed, however no information on a cardiac evaluation was discovered in the records.  She did have an EKG and echocardiogram performed locally and results were reviewed with her.  Past Medical History: Patient  has a past medical history of Anxiety.  Past Surgical History: She  has past surgical history that includes Cesarean section.  Obstetric History:  OB History    Gravida Para Term Preterm AB TAB SAB Ectopic Multiple Living   3 2             Gynecologic History:  Patient's last menstrual period was 05/31/2014 (exact date).    Medications: PNVs, baby ASA, iron Allergies: Patient has No Known Allergies.  Social History: Patient  reports that she has quit smoking. She has never used smokeless tobacco. She reports that she does not drink alcohol or use illicit drugs.  Family History: denies hx of MI or congenital heart disease  Review of Systems A full 12 point review of systems was negative or as noted in the History of Present Illness.  Specifically, no SOB or CP and Ms. Mudgett is able to walk for exercise without difficulty.  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  Consider f/up echo.   Asessement:  24 y.o. G3P2 at [redacted]w[redacted]d by L=12wk [place] with a history of obesity, 2 prior c/s, and echocardiogram findings of a PFO  with possible ASD.    Plan: 1.  Obesity.  Continue baby ASA.  Has f/up US for growth scheduled October 6.  Follow up ultrasound for fetal growth has already been scheduled.  Consider weekly NST/AFI after 36 weeks given elevated BMI. 2.  ? PFO and possible ASD.  Continue baby ASA.  Will repeat echocardiogram and schedule cardiology appointment with Dr. Elesa Massed at [facility] for further recommendations.  If PFO confirmed, will need filtered needles.  Consider anesthesia consult.  If ASD is confirmed, consider screening fetal echocardiogram and baseline echocardiograms on her other children (or informing their pediatricians of her diagnosis). 3.  Planning repeat c/s with [facility]. 4.  Desires Nexplanon for birth control.  Planning to breastfeed.  Expecting a third girl!  Total time spent with the patient was 30 minutes with greater than 50% spent in counseling and coordination of care. We appreciate this interesting consult and will be happy to be involved in the ongoing care of [name] in anyway her obstetricians desire.  Kirby Funk, MD Maternal-Fetal Medicine [facility]

## 2015-01-13 ENCOUNTER — Ambulatory Visit
Admission: RE | Admit: 2015-01-13 | Discharge: 2015-01-13 | Disposition: A | Discharge status: Home / Self Care | Payer: Medicaid Other | Source: Ambulatory Visit | Attending: Maternal & Fetal Medicine | Admitting: Maternal & Fetal Medicine

## 2015-01-13 VITALS — BP 136/81 | HR 110 | Temp 98.3°F | Resp 18 | Ht 68.4 in | Wt 282.4 lb

## 2015-01-13 DIAGNOSIS — Q211 Atrial septal defect, unspecified: Secondary | ICD-10-CM

## 2015-01-13 DIAGNOSIS — O99213 Obesity complicating pregnancy, third trimester: Secondary | ICD-10-CM | POA: Insufficient documentation

## 2015-01-13 DIAGNOSIS — Z3A32 32 weeks gestation of pregnancy: Secondary | ICD-10-CM | POA: Insufficient documentation

## 2015-01-13 DIAGNOSIS — Z8679 Personal history of other diseases of the circulatory system: Secondary | ICD-10-CM

## 2015-01-13 DIAGNOSIS — O99210 Obesity complicating pregnancy, unspecified trimester: Secondary | ICD-10-CM

## 2015-02-07 ENCOUNTER — Inpatient Hospital Stay
Admission: EM | Admit: 2015-02-07 | Discharge: 2015-02-07 | Disposition: A | Discharge status: Home / Self Care | Payer: Medicaid Other | Attending: Certified Nurse Midwife | Admitting: Certified Nurse Midwife

## 2015-02-07 DIAGNOSIS — Z3A36 36 weeks gestation of pregnancy: Secondary | ICD-10-CM | POA: Diagnosis not present

## 2015-02-07 DIAGNOSIS — O34219 Maternal care for unspecified type scar from previous cesarean delivery: Secondary | ICD-10-CM | POA: Diagnosis present

## 2015-02-07 DIAGNOSIS — Z36 Encounter for antenatal screening of mother: Secondary | ICD-10-CM | POA: Diagnosis present

## 2015-02-08 DIAGNOSIS — O34219 Maternal care for unspecified type scar from previous cesarean delivery: Secondary | ICD-10-CM | POA: Diagnosis present
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
## 2015-02-10 ENCOUNTER — Ambulatory Visit: Payer: Medicaid Other

## 2015-02-10 ENCOUNTER — Telehealth: Payer: Self-pay

## 2015-02-10 ENCOUNTER — Ambulatory Visit
Admission: RE | Admit: 2015-02-10 | Discharge: 2015-02-10 | Disposition: A | Discharge status: Home / Self Care | Payer: Medicaid Other | Source: Ambulatory Visit | Attending: Obstetrics and Gynecology | Admitting: Obstetrics and Gynecology

## 2015-02-10 DIAGNOSIS — O99210 Obesity complicating pregnancy, unspecified trimester: Secondary | ICD-10-CM

## 2015-02-10 DIAGNOSIS — Q211 Atrial septal defect, unspecified: Secondary | ICD-10-CM

## 2015-02-10 DIAGNOSIS — Z8679 Personal history of other diseases of the circulatory system: Secondary | ICD-10-CM

## 2015-02-10 LAB — OB RESULTS CONSOLE GC/CHLAMYDIA
Chlamydia: NEGATIVE
GC PROBE AMP, GENITAL: NEGATIVE

## 2015-02-10 LAB — OB RESULTS CONSOLE GBS: GBS: NEGATIVE

## 2015-02-15 ENCOUNTER — Encounter: Payer: Self-pay | Admitting: *Deleted

## 2015-02-15 ENCOUNTER — Observation Stay
Admission: EM | Admit: 2015-02-15 | Discharge: 2015-02-15 | Disposition: A | Discharge status: Home / Self Care | Payer: Medicaid Other | Attending: Obstetrics and Gynecology | Admitting: Obstetrics and Gynecology

## 2015-02-15 DIAGNOSIS — O99213 Obesity complicating pregnancy, third trimester: Secondary | ICD-10-CM | POA: Diagnosis not present

## 2015-02-15 DIAGNOSIS — O99210 Obesity complicating pregnancy, unspecified trimester: Secondary | ICD-10-CM | POA: Diagnosis present

## 2015-02-15 DIAGNOSIS — Z3A37 37 weeks gestation of pregnancy: Secondary | ICD-10-CM | POA: Insufficient documentation

## 2015-02-15 DIAGNOSIS — Q211 Atrial septal defect, unspecified: Secondary | ICD-10-CM

## 2015-02-15 DIAGNOSIS — O133 Gestational [pregnancy-induced] hypertension without significant proteinuria, third trimester: Secondary | ICD-10-CM | POA: Diagnosis not present

## 2015-02-15 DIAGNOSIS — Z6841 Body Mass Index (BMI) 40.0 and over, adult: Secondary | ICD-10-CM | POA: Insufficient documentation

## 2015-02-15 DIAGNOSIS — R030 Elevated blood-pressure reading, without diagnosis of hypertension: Secondary | ICD-10-CM | POA: Diagnosis present

## 2015-02-15 LAB — COMPREHENSIVE METABOLIC PANEL
ALBUMIN: 2.8 g/dL — AB (ref 3.5–5.0)
ALK PHOS: 115 U/L (ref 38–126)
ALT: 13 U/L — AB (ref 14–54)
AST: 17 U/L (ref 15–41)
Anion gap: 6 (ref 5–15)
BILIRUBIN TOTAL: 0.4 mg/dL (ref 0.3–1.2)
BUN: 8 mg/dL (ref 6–20)
CALCIUM: 9.3 mg/dL (ref 8.9–10.3)
CO2: 21 mmol/L — AB (ref 22–32)
CREATININE: 0.4 mg/dL — AB (ref 0.44–1.00)
Chloride: 109 mmol/L (ref 101–111)
GFR calc Af Amer: >60 mL/min (ref >60)
GFR calc non Af Amer: >60 mL/min (ref >60)
GLUCOSE: 99 mg/dL (ref 65–99)
Potassium: 3.5 mmol/L (ref 3.5–5.1)
SODIUM: 136 mmol/L (ref 135–145)
TOTAL PROTEIN: 6.6 g/dL (ref 6.5–8.1)

## 2015-02-15 LAB — CBC
HEMATOCRIT: 35.8 % (ref 35.0–47.0)
HEMOGLOBIN: 11.6 g/dL — AB (ref 12.0–16.0)
MCH: 25.9 pg — AB (ref 26.0–34.0)
MCHC: 32.4 g/dL (ref 32.0–36.0)
MCV: 79.9 fL — ABNORMAL LOW (ref 80.0–100.0)
Platelets: 277 10*3/uL (ref 150–440)
RBC: 4.48 MIL/uL (ref 3.80–5.20)
RDW: 17.3 % — ABNORMAL HIGH (ref 11.5–14.5)
WBC: 10.3 10*3/uL (ref 3.6–11.0)

## 2015-02-15 LAB — PROTEIN / CREATININE RATIO, URINE
Creatinine, Urine: 234 mg/dL
Protein Creatinine Ratio: 0.1 mg/mg{Cre} (ref 0.00–0.15)
Total Protein, Urine: 23 mg/dL

## 2015-02-15 MED ORDER — LACTATED RINGERS IV SOLN: 500.0000 mL | INTRAVENOUS | Status: DC | PRN

## 2015-02-15 NOTE — Discharge Instructions (Signed)
We will discuss your surgery once again in detail at your post-op visit in two to four weeks. If you havent already done so, please call to make your appointment as soon as possible.  [facility]  [address]  [place], [place] [address] [place], [place] [address]  Phone: [phone] Phone: [phone]  Fax: [phone] Fax: [phone]       Preeclampsia and Eclampsia Preeclampsia is a serious condition that develops only during pregnancy. It is also called toxemia of pregnancy. This condition causes high blood pressure along with other symptoms, such as swelling and headaches. These may develop as the condition gets worse. Preeclampsia may occur 20 weeks or later into your pregnancy.  Diagnosing and treating preeclampsia early is very important. If not treated early, it can cause serious problems for you and your baby. One problem it can lead to is eclampsia, which is a condition that causes muscle jerking or shaking (convulsions) in the mother. Delivering your baby is the best treatment for preeclampsia or eclampsia.  RISK FACTORS The cause of preeclampsia is not known. You may be more likely to develop preeclampsia if you have certain risk factors. These include:   Being pregnant for the first time.  Having preeclampsia in a past pregnancy.  Having a family history of preeclampsia.  Having high blood pressure.  Being pregnant with twins or triplets.  Being 56 or older.  Being African American.  Having kidney disease or diabetes.  Having medical conditions such as lupus or blood diseases.  Being very overweight (obese). SIGNS AND SYMPTOMS  The earliest signs of preeclampsia are:  High blood pressure.  Increased protein in your urine. Your health care provider will check for this at every prenatal visit. Other symptoms that can develop include:   Severe headaches.  Sudden weight gain.  Swelling of your hands, face, legs, and  feet.  Feeling sick to your stomach (nauseous) and throwing up (vomiting).  Vision problems (blurred or double vision).  Numbness in your face, arms, legs, and feet.  Dizziness.  Slurred speech.  Sensitivity to bright lights.  Abdominal pain. DIAGNOSIS  There are no screening tests for preeclampsia. Your health care provider will ask you about symptoms and check for signs of preeclampsia during your prenatal visits. You may also have tests, including:  Urine testing.  Blood testing.  Checking your baby's heart rate.  Checking the health of your baby and your placenta using images created with sound waves (ultrasound). TREATMENT  You can work out the best treatment approach together with your health care provider. It is very important to keep all prenatal appointments. If you have an increased risk of preeclampsia, you may need more frequent prenatal exams.  Your health care provider may prescribe bed rest.  You may have to eat as little salt as possible.  You may need to take medicine to lower your blood pressure if the condition does not respond to more conservative measures.  You may need to stay in the hospital if your condition is severe. There, treatment will focus on controlling your blood pressure and fluid retention. You may also need to take medicine to prevent seizures.  If the condition gets worse, your baby may need to be delivered early to protect you and the baby. You may have your labor started with medicine (be induced), or you may have a cesarean delivery.  Preeclampsia usually goes away after the baby is born. HOME CARE INSTRUCTIONS   Only  take over-the-counter or prescription medicines as directed by your health care provider.  Lie on your left side while resting. This keeps pressure off your baby.  Elevate your feet while resting.  Get regular exercise. Ask your health care provider what type of exercise is safe for you.  Avoid caffeine and  alcohol.  Do not smoke.  Drink 6-8 glasses of water every day.  Eat a balanced diet that is low in salt. Do not add salt to your food.  Avoid stressful situations as much as possible.  Get plenty of rest and sleep.  Keep all prenatal appointments and tests as scheduled. SEEK MEDICAL CARE IF:  You are gaining more weight than expected.  You have any headaches, abdominal pain, or nausea.  You are bruising more than usual.  You feel dizzy or light-headed. SEEK IMMEDIATE MEDICAL CARE IF:   You develop sudden or severe swelling anywhere in your body. This usually happens in the legs.  You gain 5 lb (2.3 kg) or more in a week.  You have a severe headache, dizziness, problems with your vision, or confusion.  You have severe abdominal pain.  You have lasting nausea or vomiting.  You have a seizure.  You have trouble moving any part of your body.  You develop numbness in your body.  You have trouble speaking.  You have any abnormal bleeding.  You develop a stiff neck.  You pass out. MAKE SURE YOU:   Understand these instructions.  Will watch your condition.  Will get help right away if you are not doing well or get worse.   This information is not intended to replace advice given to you by your health care provider. Make sure you discuss any questions you have with your health care provider.   Document Released: 03/23/2000 Document Revised: 03/31/2013 Document Reviewed: 01/16/2013 Elsevier Interactive Patient Education [employer] Elsevier Inc.

## 2015-02-15 NOTE — Progress Notes (Signed)
OB Triage Note  Patient back from peds cards and normal fetal echo, although limited views due to gestational age I d/w her regarding her new diagnosis of gHTN (she denies any s/s of pre-eclampsia currently). I told her that the recommendation is delivery at 37-38wks. Given her history, she has an appointment with [facility] on Friday, and I told her that I want to wait for delivery, since she just has gHTN, until that consult is done before proceeding with surgery; she's had normal echo, except for PFO and ?ASD on her, [facility]  Strong precautions regarding DFM and pre-eclampsia given to patient and pt told to present to L&D with them. Growth scan last week normal and will set up patient for NST in our clinic for Thursday and plan for repeat c-section for 38/1 on 11/8. Patient amenable to plan   Recent Labs Lab 02/15/15 1225  WBC 10.3  HGB 11.6*  HCT 35.8  PLT 277    Recent Labs Lab 02/15/15 1225  NA 136  K 3.5  CL 109  CO2 21*  BUN 8  CREATININE 0.40*  CALCIUM 9.3  PROT 6.6  BILITOT 0.4  ALKPHOS 115  ALT 13*  AST 17  GLUCOSE 99   PC ratio: negative  Patient Vitals for the past 12 hrs:  BP Pulse  02/15/15 1454 129/79 mmHg 92  02/15/15 1220 134/69 mmHg 82  02/15/15 1205 (!) 142/83 mmHg 96  02/15/15 1153 (!) 150/83 mmHg 98  02/15/15 1149 134/84 mmHg 94  02/15/15 1134 131/73 mmHg (!) 106  02/15/15 1128 (!) 149/81 mmHg 100   Cornelia Copaharlie Antavia Tandy, Jr MD [facility]  Pager: [phone]

## 2015-02-15 NOTE — OB Triage Note (Signed)
Patient here for scheduled NST for obesity.

## 2015-02-15 NOTE — Progress Notes (Signed)
[facility] for scheduled fetal [facility] per [facility],CNM [facility].
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
## 2015-02-15 NOTE — Procedures (Signed)
Fetal Non Stress Test  Current Date/Time: 02/15/2015/4:29 PM Gestational Age: 24/1 weeks FHT: 125 baseline, +accels, no decels, moderate variability Toco: quiet  A/P: rNST Continue with current plan of care Patient scheduled for 38/1, November 8th repeat c-section for gHTN at term  Ashley Howell, Jr. MD [facility] Pager: [phone]

## 2015-02-16 NOTE — Discharge Summary (Signed)
OB Triage Discharge Summary   24 y.o. G3P2 @ 2265w1d presenting for antepartum testing for BMI >40.  Fetal status reassuring. EFM showed reactive NST with negative tocometry. Triage course: patient had mild range BPs but negative s/s and lab work up for pre-eclampsia. She was set up for 38/1 repeat c-section. She went downstairs and had a technically limited but negative fetal echo.   Labs pending: none RTC [facility] Disposition: Home   [place] [name], Montez HagemanJr MD [facility]  Pager: [phone]

## 2015-02-18 DIAGNOSIS — Q2112 Patent foramen ovale: Secondary | ICD-10-CM | POA: Insufficient documentation

## 2015-02-18 DIAGNOSIS — Q211 Atrial septal defect: Secondary | ICD-10-CM | POA: Insufficient documentation

## 2015-02-21 ENCOUNTER — Encounter
Admission: RE | Admit: 2015-02-21 | Discharge: 2015-02-21 | Disposition: A | Discharge status: Home / Self Care | Payer: Medicaid Other | Source: Ambulatory Visit | Attending: Obstetrics & Gynecology | Admitting: Obstetrics & Gynecology

## 2015-02-21 HISTORY — DX: Reserved for inherently not codable concepts without codable children: IMO0001

## 2015-02-21 HISTORY — DX: Gastro-esophageal reflux disease without esophagitis: K21.9

## 2015-02-21 HISTORY — DX: Congenital malformation of heart, unspecified: Q24.9

## 2015-02-21 LAB — CBC
HEMATOCRIT: 37.4 % (ref 35.0–47.0)
Hemoglobin: 12 g/dL (ref 12.0–16.0)
MCH: 25.5 pg — AB (ref 26.0–34.0)
MCHC: 32.1 g/dL (ref 32.0–36.0)
MCV: 79.6 fL — ABNORMAL LOW (ref 80.0–100.0)
Platelets: 301 10*3/uL (ref 150–440)
RBC: 4.69 MIL/uL (ref 3.80–5.20)
RDW: 17 % — AB (ref 11.5–14.5)
WBC: 10.4 10*3/uL (ref 3.6–11.0)

## 2015-02-21 LAB — DIFFERENTIAL
BASOS ABS: 0 10*3/uL (ref 0–0.1)
Basophils Relative: 0 %
EOS ABS: 0 10*3/uL (ref 0–0.7)
EOS PCT: 0 %
LYMPHS ABS: 1.7 10*3/uL (ref 1.0–3.6)
Lymphocytes Relative: 16 %
MONOS PCT: 8 %
Monocytes Absolute: 0.8 10*3/uL (ref 0.2–0.9)
Neutro Abs: 7.8 10*3/uL — ABNORMAL HIGH (ref 1.4–6.5)
Neutrophils Relative %: 76 %

## 2015-02-21 LAB — TYPE AND SCREEN
ABO/RH(D): O POS
Antibody Screen: NEGATIVE
Extend sample reason: UNDETERMINED

## 2015-02-21 LAB — ABO/RH: ABO/RH(D): O POS

## 2015-02-22 ENCOUNTER — Inpatient Hospital Stay
Admission: RE | Admit: 2015-02-22 | Discharge: 2015-02-25 | DRG: 765 | Disposition: A | Discharge status: Home / Self Care | Payer: Medicaid Other | Source: Ambulatory Visit | Attending: Obstetrics & Gynecology | Admitting: Obstetrics & Gynecology

## 2015-02-22 ENCOUNTER — Inpatient Hospital Stay: Payer: Medicaid Other | Admitting: Anesthesiology

## 2015-02-22 ENCOUNTER — Encounter
Admission: RE | Disposition: A | Discharge status: Home / Self Care | Payer: Self-pay | Source: Ambulatory Visit | Attending: Obstetrics & Gynecology

## 2015-02-22 DIAGNOSIS — R51 Headache: Secondary | ICD-10-CM | POA: Diagnosis not present

## 2015-02-22 DIAGNOSIS — D62 Acute posthemorrhagic anemia: Secondary | ICD-10-CM | POA: Diagnosis not present

## 2015-02-22 DIAGNOSIS — O34211 Maternal care for low transverse scar from previous cesarean delivery: Secondary | ICD-10-CM | POA: Diagnosis present

## 2015-02-22 DIAGNOSIS — O34219 Maternal care for unspecified type scar from previous cesarean delivery: Secondary | ICD-10-CM | POA: Diagnosis present

## 2015-02-22 DIAGNOSIS — Z3A38 38 weeks gestation of pregnancy: Secondary | ICD-10-CM

## 2015-02-22 DIAGNOSIS — O99214 Obesity complicating childbirth: Secondary | ICD-10-CM | POA: Diagnosis present

## 2015-02-22 DIAGNOSIS — O9081 Anemia of the puerperium: Secondary | ICD-10-CM | POA: Diagnosis not present

## 2015-02-22 DIAGNOSIS — Z98891 History of uterine scar from previous surgery: Secondary | ICD-10-CM

## 2015-02-22 DIAGNOSIS — Z6841 Body Mass Index (BMI) 40.0 and over, adult: Secondary | ICD-10-CM

## 2015-02-22 DIAGNOSIS — O134 Gestational [pregnancy-induced] hypertension without significant proteinuria, complicating childbirth: Secondary | ICD-10-CM | POA: Diagnosis present

## 2015-02-22 LAB — RPR: RPR Ser Ql: NONREACTIVE

## 2015-02-22 LAB — HIV ANTIBODY (ROUTINE TESTING W REFLEX): HIV Screen 4th Generation wRfx: NONREACTIVE

## 2015-02-22 SURGERY — Surgical Case
Anesthesia: Spinal

## 2015-02-22 MED ORDER — LACTATED RINGERS IV SOLN
INTRAVENOUS | Status: DC
  Administered 2015-02-22: 07:00:00 via INTRAVENOUS

## 2015-02-22 MED ORDER — BUPIVACAINE 0.25 % ON-Q PUMP DUAL CATH 400 ML
400.0000 mL | INJECTION | Status: DC
  Filled 2015-02-22: qty 400

## 2015-02-22 MED ORDER — NALBUPHINE HCL 10 MG/ML IJ SOLN: 5.0000 mg | Freq: Once | INTRAMUSCULAR | Status: DC | PRN

## 2015-02-22 MED ORDER — SODIUM CHLORIDE 0.9 % IJ SOLN: 3.0000 mL | INTRAMUSCULAR | Status: DC | PRN

## 2015-02-22 MED ORDER — NALBUPHINE HCL 10 MG/ML IJ SOLN: 5.0000 mg | INTRAMUSCULAR | Status: DC | PRN

## 2015-02-22 MED ORDER — ZOLPIDEM TARTRATE 5 MG PO TABS: 5.0000 mg | ORAL_TABLET | Freq: Every evening | ORAL | Status: DC | PRN

## 2015-02-22 MED ORDER — DIBUCAINE 1 % RE OINT: 1.0000 "application " | TOPICAL_OINTMENT | RECTAL | Status: DC | PRN

## 2015-02-22 MED ORDER — SIMETHICONE 80 MG PO CHEW: 80.0000 mg | CHEWABLE_TABLET | ORAL | Status: DC | PRN

## 2015-02-22 MED ORDER — LACTATED RINGERS IV SOLN: INTRAVENOUS | Status: DC

## 2015-02-22 MED ORDER — OXYTOCIN 40 UNITS IN LACTATED RINGERS INFUSION - SIMPLE MED: 62.5000 mL/h | INTRAVENOUS | Status: DC

## 2015-02-22 MED ORDER — DIPHENHYDRAMINE HCL 25 MG PO CAPS
25.0000 mg | ORAL_CAPSULE | Freq: Four times a day (QID) | ORAL | Status: DC | PRN
  Administered 2015-02-22: 25 mg via ORAL

## 2015-02-22 MED ORDER — ACETAMINOPHEN 325 MG PO TABS
650.0000 mg | ORAL_TABLET | ORAL | Status: DC | PRN
Start: 2015-02-22 — End: 2015-02-25
  Administered 2015-02-23: 650 mg via ORAL
  Filled 2015-02-22: qty 2

## 2015-02-22 MED ORDER — OXYTOCIN 40 UNITS IN LACTATED RINGERS INFUSION - SIMPLE MED
INTRAVENOUS | Status: AC
  Administered 2015-02-22: 1000 mL via INTRAVENOUS
  Filled 2015-02-22: qty 1000

## 2015-02-22 MED ORDER — BUPIVACAINE HCL (PF) 0.75 % IJ SOLN
INTRAMUSCULAR | Status: DC | PRN
  Administered 2015-02-22: 2 mL

## 2015-02-22 MED ORDER — CEFOXITIN SODIUM-DEXTROSE 2-2.2 GM-% IV SOLR (PREMIX)
2.0000 g | INTRAVENOUS | Status: AC
Start: 2015-02-22 — End: 2015-02-22
  Administered 2015-02-22: 2 g via INTRAVENOUS

## 2015-02-22 MED ORDER — CITRIC ACID-SODIUM CITRATE 334-500 MG/5ML PO SOLN
ORAL | Status: AC
  Administered 2015-02-22: 30 mL
  Filled 2015-02-22: qty 15

## 2015-02-22 MED ORDER — ONDANSETRON HCL 4 MG/2ML IJ SOLN
INTRAMUSCULAR | Status: DC | PRN
  Administered 2015-02-22: 4 mg via INTRAVENOUS

## 2015-02-22 MED ORDER — SCOPOLAMINE 1 MG/3DAYS TD PT72: 1.0000 | MEDICATED_PATCH | Freq: Once | TRANSDERMAL | Status: DC

## 2015-02-22 MED ORDER — SIMETHICONE 80 MG PO CHEW
80.0000 mg | CHEWABLE_TABLET | ORAL | Status: DC
  Administered 2015-02-23: 80 mg via ORAL
  Filled 2015-02-22 (×2): qty 1

## 2015-02-22 MED ORDER — BUPIVACAINE HCL (PF) 0.5 % IJ SOLN
INTRAMUSCULAR | Status: AC
  Administered 2015-02-22: 10 mL
  Filled 2015-02-22: qty 30

## 2015-02-22 MED ORDER — FENTANYL CITRATE (PF) 100 MCG/2ML IJ SOLN
INTRAMUSCULAR | Status: DC | PRN
  Administered 2015-02-22: 50 ug via INTRAVENOUS

## 2015-02-22 MED ORDER — BUPIVACAINE HCL 0.5 % IJ SOLN
10.0000 mL | Freq: Once | INTRAMUSCULAR | Status: AC
  Administered 2015-02-22: 10 mL

## 2015-02-22 MED ORDER — WITCH HAZEL-GLYCERIN EX PADS: 1.0000 "application " | MEDICATED_PAD | CUTANEOUS | Status: DC | PRN

## 2015-02-22 MED ORDER — ESMOLOL HCL 100 MG/10ML IV SOLN
INTRAVENOUS | Status: DC | PRN
  Administered 2015-02-22: 40 mg via INTRAVENOUS

## 2015-02-22 MED ORDER — SIMETHICONE 80 MG PO CHEW
80.0000 mg | CHEWABLE_TABLET | Freq: Three times a day (TID) | ORAL | Status: DC
  Administered 2015-02-23 – 2015-02-25 (×3): 80 mg via ORAL
  Filled 2015-02-22 (×7): qty 1

## 2015-02-22 MED ORDER — DIPHENHYDRAMINE HCL 25 MG PO CAPS
25.0000 mg | ORAL_CAPSULE | ORAL | Status: DC | PRN
  Filled 2015-02-22: qty 1

## 2015-02-22 MED ORDER — CEFOXITIN SODIUM-DEXTROSE 2-2.2 GM-% IV SOLR (PREMIX)
INTRAVENOUS | Status: AC
  Filled 2015-02-22: qty 50

## 2015-02-22 MED ORDER — POTASSIUM CITRATE-CITRIC ACID 1100-334 MG/5ML PO SOLN: 10.0000 meq | Freq: Three times a day (TID) | ORAL | Status: DC

## 2015-02-22 MED ORDER — MIDAZOLAM HCL 2 MG/2ML IJ SOLN
INTRAMUSCULAR | Status: DC | PRN
  Administered 2015-02-22: 2 mg via INTRAVENOUS

## 2015-02-22 MED ORDER — ONDANSETRON HCL 4 MG/2ML IJ SOLN: 4.0000 mg | Freq: Once | INTRAMUSCULAR | Status: DC | PRN

## 2015-02-22 MED ORDER — MORPHINE SULFATE (PF) 2 MG/ML IV SOLN: 1.0000 mg | INTRAVENOUS | Status: DC | PRN

## 2015-02-22 MED ORDER — NALOXONE HCL 0.4 MG/ML IJ SOLN: 0.4000 mg | INTRAMUSCULAR | Status: DC | PRN

## 2015-02-22 MED ORDER — LACTATED RINGERS IV SOLN
INTRAVENOUS | Status: DC
  Administered 2015-02-22: 18:00:00 via INTRAVENOUS

## 2015-02-22 MED ORDER — MEPERIDINE HCL 25 MG/ML IJ SOLN: 6.2500 mg | INTRAMUSCULAR | Status: DC | PRN

## 2015-02-22 MED ORDER — MENTHOL 3 MG MT LOZG: 1.0000 | LOZENGE | OROMUCOSAL | Status: DC | PRN

## 2015-02-22 MED ORDER — BUPIVACAINE HCL 0.25 % IJ SOLN
INTRAMUSCULAR | Status: DC | PRN
  Administered 2015-02-22: 10 mL

## 2015-02-22 MED ORDER — NALOXONE HCL 2 MG/2ML IJ SOSY: 1.0000 ug/kg/h | PREFILLED_SYRINGE | INTRAVENOUS | Status: DC | PRN

## 2015-02-22 MED ORDER — KETOROLAC TROMETHAMINE 30 MG/ML IJ SOLN
30.0000 mg | Freq: Four times a day (QID) | INTRAMUSCULAR | Status: DC
Start: 2015-02-22 — End: 2015-02-23
  Administered 2015-02-22 – 2015-02-23 (×3): 30 mg via INTRAVENOUS
  Filled 2015-02-22 (×3): qty 1

## 2015-02-22 MED ORDER — OXYCODONE-ACETAMINOPHEN 5-325 MG PO TABS
1.0000 | ORAL_TABLET | ORAL | Status: DC | PRN
  Administered 2015-02-23: 1 via ORAL
  Filled 2015-02-22: qty 1

## 2015-02-22 MED ORDER — ONDANSETRON HCL 4 MG/2ML IJ SOLN: 4.0000 mg | Freq: Three times a day (TID) | INTRAMUSCULAR | Status: DC | PRN

## 2015-02-22 MED ORDER — FENTANYL CITRATE (PF) 100 MCG/2ML IJ SOLN: 25.0000 ug | INTRAMUSCULAR | Status: DC | PRN

## 2015-02-22 MED ORDER — PRENATAL MULTIVITAMIN CH
1.0000 | ORAL_TABLET | Freq: Every day | ORAL | Status: DC
  Administered 2015-02-23 – 2015-02-25 (×3): 1 via ORAL
  Filled 2015-02-22 (×3): qty 1

## 2015-02-22 MED ORDER — DIPHENHYDRAMINE HCL 50 MG/ML IJ SOLN
12.5000 mg | INTRAMUSCULAR | Status: DC | PRN
  Administered 2015-02-22: 12.5 mg via INTRAVENOUS
  Filled 2015-02-22: qty 1

## 2015-02-22 MED ORDER — LACTATED RINGERS IV SOLN: 500.0000 mL | INTRAVENOUS | Status: DC | PRN

## 2015-02-22 MED ORDER — LANOLIN HYDROUS EX OINT: 1.0000 "application " | TOPICAL_OINTMENT | CUTANEOUS | Status: DC | PRN

## 2015-02-22 MED ORDER — SENNOSIDES-DOCUSATE SODIUM 8.6-50 MG PO TABS
2.0000 | ORAL_TABLET | ORAL | Status: DC
  Administered 2015-02-22: 2 via ORAL
  Filled 2015-02-22 (×2): qty 2

## 2015-02-22 MED ORDER — IBUPROFEN 600 MG PO TABS
600.0000 mg | ORAL_TABLET | Freq: Four times a day (QID) | ORAL | Status: DC | PRN
  Administered 2015-02-23 – 2015-02-25 (×7): 600 mg via ORAL
  Filled 2015-02-22 (×7): qty 1

## 2015-02-22 SURGICAL SUPPLY — 20 items
CANISTER SUCT 3000ML (MISCELLANEOUS) ×3 IMPLANT
CATH KIT ON-Q SILVERSOAK 5IN (CATHETERS) ×6 IMPLANT
CHLORAPREP W/TINT 26ML (MISCELLANEOUS) ×6 IMPLANT
ELECT CAUTERY BLADE 6.4 (BLADE) IMPLANT
GLOVE SKINSENSE NS SZ8.0 LF (GLOVE) ×16
GLOVE SKINSENSE STRL SZ8.0 LF (GLOVE) ×8 IMPLANT
GOWN STRL REUS W/ TWL LRG LVL3 (GOWN DISPOSABLE) ×1 IMPLANT
GOWN STRL REUS W/ TWL XL LVL3 (GOWN DISPOSABLE) ×2 IMPLANT
GOWN STRL REUS W/TWL LRG LVL3 (GOWN DISPOSABLE) ×2
GOWN STRL REUS W/TWL XL LVL3 (GOWN DISPOSABLE) ×4
LIQUID BAND (GAUZE/BANDAGES/DRESSINGS) ×3 IMPLANT
NS IRRIG 1000ML POUR BTL (IV SOLUTION) ×3 IMPLANT
PACK C SECTION AR (MISCELLANEOUS) ×3 IMPLANT
PAD GROUND ADULT SPLIT (MISCELLANEOUS) ×3 IMPLANT
PAD OB MATERNITY 4.3X12.25 (PERSONAL CARE ITEMS) ×3 IMPLANT
PAD PREP 24X41 OB/GYN DISP (PERSONAL CARE ITEMS) ×3 IMPLANT
SUT MAXON ABS #0 GS21 30IN (SUTURE) ×6 IMPLANT
SUT VIC AB 1 CT1 36 (SUTURE) ×9 IMPLANT
SUT VIC AB 2-0 CT1 36 (SUTURE) IMPLANT
SUT VIC AB 4-0 FS2 27 (SUTURE) ×3 IMPLANT

## 2015-02-22 NOTE — Anesthesia Preprocedure Evaluation (Signed)
Anesthesia Evaluation  Patient identified by MRN, date of birth, ID band Patient awake    Reviewed: Allergy & Precautions, NPO status , Patient's Chart, lab work & pertinent test results  Airway Mallampati: II       Dental no notable dental hx.    Pulmonary former smoker,    Pulmonary exam normal        Cardiovascular Exercise Tolerance: Good  Rhythm:Regular Rate:Normal     Neuro/Psych    GI/Hepatic Neg liver ROS, GERD  ,  Endo/Other  negative endocrine ROS  Renal/GU negative Renal ROS     Musculoskeletal negative musculoskeletal ROS (+)   Abdominal Normal abdominal exam  (+)   Peds  Hematology negative hematology ROS (+)   Anesthesia Other Findings   Reproductive/Obstetrics negative OB ROS                             Anesthesia Physical Anesthesia Plan  ASA: II  Anesthesia Plan: Spinal   Post-op Pain Management:    Induction:   Airway Management Planned: Nasal Cannula  Additional Equipment:   Intra-op Plan:   Post-operative Plan:   Informed Consent: I have reviewed the patients History and Physical, chart, labs and discussed the procedure including the risks, benefits and alternatives for the proposed anesthesia with the patient or authorized representative who has indicated his/her understanding and acceptance.     Plan Discussed with: CRNA  Anesthesia Plan Comments:         Anesthesia Quick Evaluation

## 2015-02-22 NOTE — H&P (Signed)
History and Physical Interval Note:  02/22/2015 6:45 AM  Ashley Howell  has presented today for surgery, with the diagnosis of prior csection  The various methods of treatment have been discussed with the patient and family. After consideration of risks, benefits and other options for treatment, the patient has consented to  Procedure(s): CESAREAN SECTION (N/A) as a surgical intervention .  The patient's history has been reviewed, patient examined, no change in status, stable for surgery.  Pt has the following beta blocker history-  Not taking Beta Blocker.  I have reviewed the patient's chart and labs.  Questions were answered to the patient's satisfaction.    Rahmah Mccamy Renae FicklePAUL

## 2015-02-22 NOTE — Discharge Summary (Signed)
Obstetrical Discharge Summary  Date of Admission: 02/22/2015 Date of Discharge: @dischargedt @  Discharge Diagnosis: Term Pregnancy-delivered, s/p cesarean Patient Active Problem List   Diagnosis Date Noted  . S/P cesarean section 02/22/2015  . Obesity in pregnancy, antepartum 02/15/2015  . Previous cesarean delivery, antepartum condition or complication 02/08/2015  . Atrial septal defect 01/13/2015  . Labor and delivery, indication for care 11/12/2014  . Obesity affecting pregnancy 10/28/2014  . History of cardiomegaly 10/28/2014        Primary OB:  [facility]   Gestational Age at Delivery: 6760w1d  Antepartum complications: none Date of Delivery: 02/22/15  Delivered By: Tiburcio PeaHarris Delivery Type: repeat cesarean section, low transverse incision Intrapartum complications/course: None Anesthesia: spinal Placenta: manual removal Laceration: n/a Episiotomy: none Live born female  Birth Weight: 6 lb 8.1 oz (2950 g) APGAR: 8, 9   Post partum course: Since the delivery, patient has tolerate activity, diet, and daily functions without difficulty or complication.  Min lochia.  No breast concerns at this time.  No signs of depression currently. Mild range pressures, but no s/sx preeclampsia.  Postpartum Exam:  BP 140/89 mmHg  Pulse 94  Temp(Src) 98.7 F (37.1 C) (Oral)  Resp 20  SpO2 100%   GEN: NAD CV: RRR PULM: CTABL. Nl effort ABD: s/nd/nt, fundus firm and below umbilicus Incision: c/d/i, on-q pump in place but leaking.  To be removed before discharge. EXT: no ttp, LEs.  Disposition: home with infant Rh Immune globulin given: not applicable Rubella vaccine given: yes Varicella vaccine given: no Tdap vaccine given in AP or PP setting: given during prenatal care Flu vaccine given in AP or PP setting: given during prenatal care Contraception: oral progesterone-only contraceptive, Nexplanon, to be determined at post partum visit  Prenatal Labs: O POS//Rubella Immune//RPR  negative//HIV negative/HepB Surface Ag negative//plans to breastfeed  Plan:  Ashley Howell was discharged to home in good condition. [facility]  Discharge Medications:   Medication List    TAKE these medications        acetaminophen 325 MG tablet  Commonly known as:  TYLENOL  Take 2 tablets (650 mg total) by mouth every 4 (four) hours as needed (for pain scale < 4).     ferrous sulfate 325 (65 FE) MG tablet  Take 325 mg by mouth daily with breakfast.     HYDROcodone-acetaminophen 5-325 MG tablet  Commonly known as:  NORCO/VICODIN  Take 1 tablet by mouth every 4 (four) hours as needed for moderate pain or severe pain.     ibuprofen 600 MG tablet  Commonly known as:  ADVIL,MOTRIN  Take 1 tablet (600 mg total) by mouth every 6 (six) hours as needed for mild pain.        Follow-up arrangements:  Follow-up Information    Follow up with Letitia LibraHARRIS,ROBERT PAUL, MD In 2 weeks.   Specialty:  Obstetrics and Gynecology   Why:  For Post Op, As Scheduled   Contact information:   [address] [place] [address] [phone]         ----- Ranae Plumberhelsea Ward, MD Attending Obstetrician and Gynecologist [facility]

## 2015-02-22 NOTE — Op Note (Signed)
Cesarean Section Procedure Note Indications: prior cesarean section and term intrauterine pregnancy  Pre-operative Diagnosis: Intrauterine pregnancy 6686w1d ;  prior cesarean section and term intrauterine pregnancy Post-operative Diagnosis: same, delivered. Procedure: Low Transverse Cesarean Section Surgeon: Annamarie MajorPaul Harris, MD, FACOG Assistant(s): Dr Elesa MassedWard Anesthesia: Spinal anesthesia Estimated Blood Loss:300  Complications: None; patient tolerated the procedure well. Disposition: PACU - hemodynamically stable. Condition: stable  Findings: A female infant in the cephalic presentation. Amniotic fluid - Clear  Birth weight 6-8lbs.  Apgars of 8 and 9.  Intact placenta with a three-vessel cord. Grossly normal uterus, tubes and ovaries bilaterally. Min intraabdominal adhesions were noted.  Procedure Details   The patient was taken to Operating Room, identified as the correct patient and the procedure verified as C-Section Delivery. A Time Out was held and the above information confirmed. After induction of anesthesia, the patient was draped and prepped in the usual sterile manner. A Pfannenstiel incision was made and carried down through the subcutaneous tissue to the fascia. Fascial incision was made and extended transversely with the Mayo scissors. The fascia was separated from the underlying rectus tissue superiorly and inferiorly. The peritoneum was identified and entered bluntly. Peritoneal incision was extended longitudinally. The utero-vesical peritoneal reflection was incised transversely and a bladder flap was created digitally.  A low transverse hysterotomy was made. The fetus was delivered atraumatically. The umbilical cord was clamped x2 and cut and the infant was handed to the awaiting pediatricians. The placenta was removed intact and appeared normal with a 3-vessel cord.  The uterus was exteriorized and cleared of all clot and debris. The hysterotomy was closed with running sutures of  0 Vicryl suture. A second imbricating layer was placed with the same suture. Excellent hemostasis was observed. The uterus was returned to the abdomen. The pelvis was irrigated and again, excellent hemostasis was noted.  The On Q Pain pump System was then placed.  Trocars were placed through the abdominal wall into the subfascial space and these were used to thread the silver soaker cathaters into place.The rectus fascia was then reapproximated with running sutures of Maxon, with careful placement not to incorporate the cathaters. Subcutaneous tissues are then irrigated with saline and hemostasis assured.  Skin is then closed with 4-0 vicryl suture in a subcuticular fashion followed by skin adhesive. The cathaters are flushed each with 5 mL of Bupivicaine and stabilized into place with dressing. Instrument, sponge, and needle counts were correct prior to the abdominal closure and at the conclusion of the case.  The patient tolerated the procedure well and was transferred to the recovery room in stable condition.

## 2015-02-22 NOTE — Transfer of Care (Signed)
Immediate Anesthesia Transfer of Care Note  Patient: Ashley Howell  Procedure(s) Performed: Procedure(s): CESAREAN SECTION (N/A)  Patient Location: PACU  Anesthesia Type:Spinal  Level of Consciousness: awake, alert  and oriented  Airway & Oxygen Therapy: Patient Spontanous Breathing and Patient connected to face mask oxygen  Post-op Assessment: Report given to RN and Post -op Vital signs reviewed and stable  Post vital signs: Reviewed and stable  Last Vitals: 10:27 100% SAT 98 HR 138/79 BP 97.5 TEMP 20 RESP   Filed Vitals:   02/22/15 0723  BP: 146/86  Pulse: 83  Temp: 37 C  Resp: 18    Complications: No apparent anesthesia complications

## 2015-02-22 NOTE — Anesthesia Procedure Notes (Signed)
Spinal Patient location during procedure: OR Start time: 02/22/2015 9:10 AM End time: 02/22/2015 9:15 AM Reason for block: at surgeon's request Staffing Anesthesiologist: Elijio MilesVAN STAVEREN, GIJSBERTUS F Performed by: anesthesiologist  Preanesthetic Checklist Completed: patient identified, site marked, surgical consent, pre-op evaluation, timeout performed, IV checked, risks and benefits discussed, monitors and equipment checked and at surgeon's request Spinal Block Patient position: sitting Prep: Betadine Patient monitoring: heart rate and blood pressure Approach: midline Location: L3-4 Injection technique: single-shot Needle Needle type: Quincke  Needle gauge: 25 G Needle length: 9 cm Needle insertion depth: 8 cm Assessment Sensory level: T8

## 2015-02-23 LAB — CBC
HEMATOCRIT: 33 % — AB (ref 35.0–47.0)
HEMOGLOBIN: 10.9 g/dL — AB (ref 12.0–16.0)
MCH: 26.4 pg (ref 26.0–34.0)
MCHC: 33.1 g/dL (ref 32.0–36.0)
MCV: 79.8 fL — AB (ref 80.0–100.0)
Platelets: 258 10*3/uL (ref 150–440)
RBC: 4.14 MIL/uL (ref 3.80–5.20)
RDW: 17.4 % — AB (ref 11.5–14.5)
WBC: 11.7 10*3/uL — ABNORMAL HIGH (ref 3.6–11.0)

## 2015-02-23 MED ORDER — BENZOCAINE-MENTHOL 20-0.5 % EX AERO
INHALATION_SPRAY | CUTANEOUS | Status: AC
  Filled 2015-02-23: qty 56

## 2015-02-23 MED ORDER — BISACODYL 10 MG RE SUPP: 10.0000 mg | Freq: Every day | RECTAL | Status: DC | PRN

## 2015-02-23 MED ORDER — DOCUSATE SODIUM 100 MG PO CAPS
100.0000 mg | ORAL_CAPSULE | Freq: Every day | ORAL | Status: DC
  Administered 2015-02-23 – 2015-02-25 (×3): 100 mg via ORAL
  Filled 2015-02-23 (×3): qty 1

## 2015-02-23 MED ORDER — OXYCODONE-ACETAMINOPHEN 5-325 MG PO TABS
1.0000 | ORAL_TABLET | ORAL | Status: DC | PRN
  Administered 2015-02-24 (×2): 1 via ORAL
  Filled 2015-02-23 (×2): qty 1

## 2015-02-23 NOTE — Progress Notes (Addendum)
POD #1 repeat Cesarean section Subjective:  Feels well. Tolerating regular diet. Foley removed this AM   Objective:  Blood pressure 121/70, pulse 78, temperature 98.4 F (36.9 C), temperature source Oral, resp. rate 20, height 5\' 7"  (1.702 m), weight 284 lb (128.822 kg), last menstrual period 05/31/2014, SpO2 98 %, unknown if currently breastfeeding.  General: NAD Pulmonary: no increased work of breathing, CTA Abdomen: non-distended, non-tender, BS active x4 Heart: RRR with grade II/VI systolic murmur Incision: honey comb dressing intact, with the left 1/3 of dressing with bloody drainage. ON Q intact Extremities: no edema, no erythema, no tenderness  Results for orders placed or performed during the hospital encounter of 02/22/15 (from the past 72 hour(s))  CBC     Status: Abnormal   Collection Time: 02/23/15  6:36 AM  Result Value Ref Range   WBC 11.7 (H) 3.6 - 11.0 K/uL   RBC 4.14 3.80 - 5.20 MIL/uL   Hemoglobin 10.9 (L) 12.0 - 16.0 g/dL   HCT 23.533.0 (L) 57.335.0 - 22.047.0 %   MCV 79.8 (L) 80.0 - 100.0 fL   MCH 26.4 26.0 - 34.0 pg   MCHC 33.1 32.0 - 36.0 g/dL   RDW 25.417.4 (H) 27.011.5 - 62.314.5 %   Platelets 258 150 - 440 K/uL     Assessment:   24 y.o. [id number] postoperativeday # 1 s/p repeat Cesarean section-stable   Plan:  1)Trial of voiding, ambulate -  2) --/--/O POS (11/14 [id number]) / Rubella Immune (04/15 0000) / Varicella NI  3) TDAP status -UTD  4) Breast  5)HAs a possible PFO or ASD. Needs to resume baby ASA daily when not on NSAIDS. Has appt to follow up with [facility] Elesa MassedWard[facility].  Farrel ConnersGUTIERREZ, Anish Vana, CNM

## 2015-02-23 NOTE — Anesthesia Post-op Follow-up Note (Signed)
  Anesthesia Pain Follow-up Note  Patient: Ashley Howell  Day #: 1  Date of Follow-up: 02/23/2015 Time: 7:17 AM  Last Vitals:  Filed Vitals:   02/22/15 2331  BP: 121/73  Pulse: 99  Temp: 36.5 C  Resp: 18    Level of Consciousness: alert  Pain: none   Side Effects:None  Catheter Site Exam:clean, dry  Plan: Continue current therapy  Masco CorporationStephanie Tennie Grussing

## 2015-02-23 NOTE — Anesthesia Postprocedure Evaluation (Signed)
  Anesthesia Post-op Note  Patient: Ashley JunesSamantha S Franssen  Procedure(s) Performed: Procedure(s): CESAREAN SECTION (N/A)  Anesthesia type:Spinal  Patient location: [address] A  Post pain: Pain level controlled  Post assessment: Post-op Vital signs reviewed, Patient's Cardiovascular Status Stable, Respiratory Function Stable, Patent Airway and No signs of Nausea or vomiting  Post vital signs: Reviewed and stable  Last Vitals:  Filed Vitals:   02/22/15 2331  BP: 121/73  Pulse: 99  Temp: 36.5 C  Resp: 18    Level of consciousness: awake, alert  and patient cooperative  Complications: No apparent anesthesia complications

## 2015-02-24 MED ORDER — HYDROCODONE-ACETAMINOPHEN 5-325 MG PO TABS
1.0000 | ORAL_TABLET | ORAL | Status: DC | PRN
  Administered 2015-02-24: 2 via ORAL
  Administered 2015-02-24 – 2015-02-25 (×3): 1 via ORAL
  Administered 2015-02-25: 2 via ORAL
  Filled 2015-02-24 (×2): qty 1
  Filled 2015-02-24: qty 2
  Filled 2015-02-24 (×3): qty 1

## 2015-02-24 NOTE — Progress Notes (Addendum)
  Subjective:  Doing well, does report mild headache, she associated this with her percocet administration.  Lochia appropriate equal to menses  Objective:   Blood pressure 124/85, pulse 100, temperature 98.7 F (37.1 C), temperature source Oral, resp. rate 20, height 5\' 7"  (1.702 m), weight 128.822 kg (284 lb), last menstrual period 05/31/2014, SpO2 100 %, unknown if currently breastfeeding.  General: NAD Pulmonary: no increased work of breathing Abdomen: non-distended, non-tender, fundus firm at level of umbilicus, incision D/C/I ON-Q in place Extremities: no edema, no erythema, no tenderness  Results for orders placed or performed during the hospital encounter of 02/22/15 (from the past 72 hour(s))  CBC     Status: Abnormal   Collection Time: 02/23/15  6:36 AM  Result Value Ref Range   WBC 11.7 (H) 3.6 - 11.0 K/uL   RBC 4.14 3.80 - 5.20 MIL/uL   Hemoglobin 10.9 (L) 12.0 - 16.0 g/dL   HCT 16.133.0 (L) 09.635.0 - 04.547.0 %   MCV 79.8 (L) 80.0 - 100.0 fL   MCH 26.4 26.0 - 34.0 pg   MCHC 33.1 32.0 - 36.0 g/dL   RDW 40.917.4 (H) 81.111.5 - 91.414.5 %   Platelets 258 150 - 440 K/uL    Assessment:   24 y.o. G4P1011 postpartum day # 2 RLTCS  Plan:   1) Acute blood loss anemia - hemodynamically stable and asymptomatic - po ferrous sulfate  2) --/--/O POS (11/14 [id number]) Ishmael Holter/Rubella Immune (04/15 0000) / Varicella Non-Immune - needs varivax at discharge  3) TDAP status up to date  4) Breast/Nexplanon  5) Headache - switch to vicodin and monitor, no positional so not classic for spinal headache  6) Disposition anticipate discharge PPD#2

## 2015-02-25 MED ORDER — HYDROCODONE-ACETAMINOPHEN 5-325 MG PO TABS: 1.0000 | ORAL_TABLET | ORAL | Status: DC | PRN

## 2015-02-25 MED ORDER — ACETAMINOPHEN 325 MG PO TABS: 650.0000 mg | ORAL_TABLET | ORAL | Status: DC | PRN

## 2015-02-25 MED ORDER — IBUPROFEN 600 MG PO TABS: 600.0000 mg | ORAL_TABLET | Freq: Four times a day (QID) | ORAL | Status: DC | PRN

## 2015-02-25 NOTE — Discharge Instructions (Signed)
Cesarean Delivery, Care After °Refer to this sheet in the next few weeks. These instructions provide you with information on caring for yourself after your procedure. Your health care provider may also give you specific instructions. Your treatment has been planned according to current medical practices, but problems sometimes occur. Call your health care provider if you have any problems or questions after you go home. °HOME CARE INSTRUCTIONS  °· Only take over-the-counter or prescription medications as directed by your health care provider. °· Do not drink alcohol, especially if you are breastfeeding or taking medication to relieve pain. °· Do not chew or smoke tobacco. °· Continue to use good perineal care. Good perineal care includes: °¨ Wiping your perineum from front to back. °¨ Keeping your perineum clean. °· Check your surgical cut (incision) daily for increased redness, drainage, swelling, or separation of skin. °· Clean your incision gently with soap and water every day, and then pat it dry. If your health care provider says it is okay, leave the incision uncovered. Use a bandage (dressing) if the incision is draining fluid or appears irritated. If the adhesive strips across the incision do not fall off within 7 days, carefully peel them off. °· Hug a pillow when coughing or sneezing until your incision is healed. This helps to relieve pain. °· Do not use tampons or douche until your health care provider says it is okay. °· Shower, wash your hair, and take tub baths as directed by your health care provider. °· Wear a well-fitting bra that provides breast support. °· Limit wearing support panties or control-top hose. °· Drink enough fluids to keep your urine clear or pale yellow. °· Eat high-fiber foods such as whole grain cereals and breads, brown rice, beans, and fresh fruits and vegetables every day. These foods may help prevent or relieve constipation. °· Resume activities such as climbing stairs,  driving, lifting, exercising, or traveling as directed by your health care provider. °· Talk to your health care provider about resuming sexual activities. This is dependent upon your risk of infection, your rate of healing, and your comfort and desire to resume sexual activity. °· Try to have someone help you with your household activities and your newborn for at least a few days after you leave the hospital. °· Rest as much as possible. Try to rest or take a nap when your newborn is sleeping. °· Increase your activities gradually. °· Keep all of your scheduled postpartum appointments. It is very important to keep your scheduled follow-up appointments. At these appointments, your health care provider will be checking to make sure that you are healing physically and emotionally. °SEEK MEDICAL CARE IF:  °· You are passing large clots from your vagina. Save any clots to show your health care provider. °· You have a foul smelling discharge from your vagina. °· You have trouble urinating. °· You are urinating frequently. °· You have pain when you urinate. °· You have a change in your bowel movements. °· You have increasing redness, pain, or swelling near your incision. °· You have pus draining from your incision. °· Your incision is separating. °· You have painful, hard, or reddened breasts. °· You have a severe headache. °· You have blurred vision or see spots. °· You feel sad or depressed. °· You have thoughts of hurting yourself or your newborn. °· You have questions about your care, the care of your newborn, or medications. °· You are dizzy or light-headed. °· You have a rash. °· You   have pain, redness, or swelling at the site of the removed intravenous access (IV) tube. °· You have nausea or vomiting. °· You stopped breastfeeding and have not had a menstrual period within 12 weeks of stopping. °· You are not breastfeeding and have not had a menstrual period within 12 weeks of delivery. °· You have a fever. °SEEK  IMMEDIATE MEDICAL CARE IF: °· You have persistent pain. °· You have chest pain. °· You have shortness of breath. °· You faint. °· You have leg pain. °· You have stomach pain. °· Your vaginal bleeding saturates 2 or more sanitary pads in 1 hour. °MAKE SURE YOU:  °· Understand these instructions. °· Will watch your condition. °· Will get help right away if you are not doing well or get worse. °  °This information is not intended to replace advice given to you by your health care provider. Make sure you discuss any questions you have with your health care provider. °  °Document Released: 12/16/2001 Document Revised: 04/16/2014 Document Reviewed: 11/21/2011 °Elsevier Interactive Patient Education ©2016 [employer] °Follow up sooner with fever greater than 100.5, problems breathing, pain not helped by medications, severe depression ( more than just baby blues, wanting to hurt yourself or the baby), severe bleeding( saturating more than one pad an hour or large palm sized clots),or any incisional concerns such as increased redness, swelling, discharge or increased pain in incision.  No heavy lifting for 6 weeks.  No driving while taking narcotics. No douches, intercourse, tampons, or enemas for 6 weeks.  °

## 2015-02-25 NOTE — Progress Notes (Signed)
All discharge instructions given to patient  and she voices understanding of all instructions given. Prescriptions given. She is aware of f/u appt.  Pt discharged home escorted out by auxiilary in wheelchair holding infant.

## 2015-02-28 ENCOUNTER — Other Ambulatory Visit: Payer: Medicaid Other

## 2015-05-02 ENCOUNTER — Emergency Department
Admission: EM | Admit: 2015-05-02 | Discharge: 2015-05-02 | Disposition: A | Discharge status: Home / Self Care | Payer: Medicaid Other | Attending: Emergency Medicine | Admitting: Emergency Medicine

## 2015-05-02 ENCOUNTER — Encounter: Payer: Self-pay | Admitting: Emergency Medicine

## 2015-05-02 DIAGNOSIS — R5383 Other fatigue: Secondary | ICD-10-CM

## 2015-05-02 DIAGNOSIS — Z3202 Encounter for pregnancy test, result negative: Secondary | ICD-10-CM | POA: Diagnosis not present

## 2015-05-02 DIAGNOSIS — R079 Chest pain, unspecified: Secondary | ICD-10-CM | POA: Diagnosis not present

## 2015-05-02 DIAGNOSIS — Z87891 Personal history of nicotine dependence: Secondary | ICD-10-CM | POA: Diagnosis not present

## 2015-05-02 DIAGNOSIS — F419 Anxiety disorder, unspecified: Secondary | ICD-10-CM | POA: Insufficient documentation

## 2015-05-02 DIAGNOSIS — Z79899 Other long term (current) drug therapy: Secondary | ICD-10-CM | POA: Diagnosis not present

## 2015-05-02 LAB — URINALYSIS COMPLETE WITH MICROSCOPIC (ARMC ONLY)
BILIRUBIN URINE: NEGATIVE
Bacteria, UA: NONE SEEN
GLUCOSE, UA: NEGATIVE mg/dL
KETONES UR: NEGATIVE mg/dL
LEUKOCYTES UA: NEGATIVE
Nitrite: NEGATIVE
PH: 5 (ref 5.0–8.0)
Protein, ur: NEGATIVE mg/dL
Specific Gravity, Urine: 1.028 (ref 1.005–1.030)

## 2015-05-02 LAB — CBC
HCT: 37.3 % (ref 35.0–47.0)
Hemoglobin: 12.5 g/dL (ref 12.0–16.0)
MCH: 26.3 pg (ref 26.0–34.0)
MCHC: 33.5 g/dL (ref 32.0–36.0)
MCV: 78.7 fL — ABNORMAL LOW (ref 80.0–100.0)
PLATELETS: 347 10*3/uL (ref 150–440)
RBC: 4.75 MIL/uL (ref 3.80–5.20)
RDW: 15.4 % — ABNORMAL HIGH (ref 11.5–14.5)
WBC: 10.5 10*3/uL (ref 3.6–11.0)

## 2015-05-02 LAB — BASIC METABOLIC PANEL
Anion gap: 7 (ref 5–15)
BUN: 13 mg/dL (ref 6–20)
CALCIUM: 9.2 mg/dL (ref 8.9–10.3)
CO2: 24 mmol/L (ref 22–32)
CREATININE: 0.73 mg/dL (ref 0.44–1.00)
Chloride: 107 mmol/L (ref 101–111)
GFR calc non Af Amer: >60 mL/min (ref >60)
Glucose, Bld: 90 mg/dL (ref 65–99)
Potassium: 3.6 mmol/L (ref 3.5–5.1)
SODIUM: 138 mmol/L (ref 135–145)

## 2015-05-02 LAB — POCT PREGNANCY, URINE: Preg Test, Ur: NEGATIVE

## 2015-05-02 NOTE — ED Notes (Signed)
C/O general weakness for the past few days.  Also c/o feeling lightheaded.   Patient had birthcontrol implant placed on December 29th, and patient states since that time she has been feeling general weakness etc.  Patient had c section February 22, 2015

## 2015-05-02 NOTE — ED Provider Notes (Signed)
[facility] Emergency Department Provider Note  ____________________________________________    I have reviewed the triage vital signs and the nursing notes.   HISTORY  Chief Complaint Fatigue   HPI Ashley Howell is a 25 y.o. female who presents with complaints of fatigue and a sense of low energy for the last month. She reports this started after having a birth control implant placed on 04/07/2015. She denies fevers chills. No abdominal pain. No shortness of breath or cough. No dysuria. She is not seen her physician about this. She denies dizziness to me although reports that she did feel dizzy last week. She reports that she had chest pain 3 days ago as well which has since resolved. At the time it was aching in nature and mild.     Past Medical History  Diagnosis Date  . Anxiety   . Shortness of breath dyspnea   . Heart defect   . GERD (gastroesophageal reflux disease)     Patient Active Problem List   Diagnosis Date Noted  . S/P cesarean section 02/22/2015  . Obesity in pregnancy, antepartum 02/15/2015  . Previous cesarean delivery, antepartum condition or complication 02/08/2015  . Atrial septal defect 01/13/2015  . Cramping affecting pregnancy, antepartum 12/08/2014  . Labor and delivery, indication for care 11/12/2014  . Obesity affecting pregnancy 10/28/2014  . History of cardiomegaly 10/28/2014  . First trimester screening     Past Surgical History  Procedure Laterality Date  . Cesarean section    . Cesarean section N/A 02/22/2015    Procedure: CESAREAN SECTION;  Surgeon: Nadara Mustard, MD;  Location: [facility] ORS;  Service: Obstetrics;  Laterality: N/A;    Current Outpatient Rx  Name  Route  Sig  Dispense  Refill  . acetaminophen (TYLENOL) 325 MG tablet   Oral   Take 2 tablets (650 mg total) by mouth every 4 (four) hours as needed (for pain scale < 4).         . ferrous sulfate 325 (65 FE) MG tablet   Oral   Take 325 mg by  mouth daily with breakfast.         . HYDROcodone-acetaminophen (NORCO/VICODIN) 5-325 MG tablet   Oral   Take 1 tablet by mouth every 4 (four) hours as needed for moderate pain or severe pain.   31 tablet   0   . ibuprofen (ADVIL,MOTRIN) 600 MG tablet   Oral   Take 1 tablet (600 mg total) by mouth every 6 (six) hours as needed for mild pain.   45 tablet   0     Allergies Review of patient's allergies indicates no known allergies.  No family history on file.  Social History Social History  Substance Use Topics  . Smoking status: Former Smoker    Quit date: 05/23/2014  . Smokeless tobacco: Never Used  . Alcohol Use: No    Review of Systems  Constitutional: Negative for fever. Eyes: Negative for visual changes. ENT: Negative for sore throat Cardiovascular: As above Respiratory: Negative for shortness of breath. Gastrointestinal: Negative for abdominal pain, vomiting and diarrhea. Genitourinary: Negative for dysuria. Musculoskeletal: Negative for back pain. Skin: Negative for rash. Neurological: Negative for headaches, no focal weakness Psychiatric: Mild anxiety    ____________________________________________   PHYSICAL EXAM:  VITAL SIGNS: ED Triage Vitals  Enc Vitals Group     BP 05/02/15 1548 130/77 mmHg     Pulse Rate 05/02/15 1548 89     Resp 05/02/15 1548 18  Temp 05/02/15 1548 98.1 F (36.7 C)     Temp Source 05/02/15 1548 Oral     SpO2 05/02/15 1548 98 %     Weight 05/02/15 1548 262 lb (118.842 kg)     Height 05/02/15 1548  (1.676 m)     Head Cir --      Peak Flow --      Pain Score 05/02/15 1558 0     Pain Loc --      Pain Edu? --      Excl. in GC? --      Constitutional: Alert and oriented. Well appearing and in no distress. Eyes: Conjunctivae are normal.  ENT   Head: Normocephalic and atraumatic.   Mouth/Throat: Mucous membranes are moist. Cardiovascular: Normal rate, regular rhythm. Normal and symmetric distal pulses  are present in all extremities. No murmurs, rubs, or gallops. Respiratory: Normal respiratory effort without tachypnea nor retractions. Breath sounds are clear and equal bilaterally.  Gastrointestinal: Soft and non-tender in all quadrants. No distention. There is no CVA tenderness. Genitourinary: deferred Musculoskeletal: Nontender with normal range of motion in all extremities. No lower extremity tenderness nor edema. Neurologic:  Normal speech and language. No gross focal neurologic deficits are appreciated. Skin:  Skin is warm, dry and intact. No rash noted. Psychiatric: Mood and affect are normal. Patient exhibits appropriate insight and judgment.  ____________________________________________    LABS (pertinent positives/negatives)  Labs Reviewed  CBC - Abnormal; Notable for the following:    MCV 78.7 (*)    RDW 15.4 (*)    All other components within normal limits  URINALYSIS COMPLETEWITH MICROSCOPIC ([facility] ONLY) - Abnormal; Notable for the following:    Color, Urine YELLOW (*)    APPearance CLEAR (*)    Hgb urine dipstick 1+ (*)    Squamous Epithelial / LPF 0-5 (*)    All other components within normal limits  BASIC METABOLIC PANEL  POC URINE PREG, ED  POCT PREGNANCY, URINE    ____________________________________________   EKG  ED ECG REPORT I, Jene Every, the attending physician, personally viewed and interpreted this ECG.  Date: 05/02/2015 EKG Time: 3:50 PM Rate: 82 Rhythm: normal sinus rhythm QRS Axis: normal Intervals: normal ST/T Wave abnormalities: normal Conduction Disutrbances: none Narrative Interpretation: unremarkable   ____________________________________________    RADIOLOGY I have personally reviewed any xrays that were ordered on this patient: None  ____________________________________________   PROCEDURES  Procedure(s) performed: none  Critical Care performed: none  ____________________________________________   INITIAL  IMPRESSION / ASSESSMENT AND PLAN / ED COURSE  Pertinent labs & imaging results that were available during my care of the patient were reviewed by me and considered in my medical decision making (see chart for details).  Patient with benign exam and unremarkable labs. Her EKG is unremarkable. Certainly her history is not consistent with ACS she is a very very low risk. She is perc negative. I suspect her generalized fatigue is related to hormonal birth control. I recommended follow-up with her PCP  ____________________________________________   FINAL CLINICAL IMPRESSION(S) / ED DIAGNOSES  Final diagnoses:  Other fatigue  Chest pain, unspecified chest pain type     Jene Every, MD 05/02/15 2113

## 2015-05-02 NOTE — Discharge Instructions (Signed)
Fatigue  Fatigue is feeling tired all of the time, a lack of energy, or a lack of motivation. Occasional or mild fatigue is often a normal response to activity or life in general. However, long-lasting (chronic) or extreme fatigue may indicate an underlying medical condition.  HOME CARE INSTRUCTIONS   Watch your fatigue for any changes. The following actions may help to lessen any discomfort you are feeling:  · Talk to your health care provider about how much sleep you need each night. Try to get the required amount every night.  · Take medicines only as directed by your health care provider.  · Eat a healthy and nutritious diet. Ask your health care provider if you need help changing your diet.  · Drink enough fluid to keep your urine clear or pale yellow.  · Practice ways of relaxing, such as yoga, meditation, massage therapy, or acupuncture.  · Exercise regularly.    · Change situations that cause you stress. Try to keep your work and personal routine reasonable.  · Do not abuse illegal drugs.  · Limit alcohol intake to no more than 1 drink per day for nonpregnant women and 2 drinks per day for men. One drink equals 12 ounces of beer, 5 ounces of wine, or 1½ ounces of hard liquor.  · Take a multivitamin, if directed by your health care provider.  SEEK MEDICAL CARE IF:   · Your fatigue does not get better.  · You have a fever.    · You have unintentional weight loss or gain.  · You have headaches.    · You have difficulty:      Falling asleep.    Sleeping throughout the night.  · You feel angry, guilty, anxious, or sad.     · You are unable to have a bowel movement (constipation).    · You skin is dry.     · Your legs or another part of your body is swollen.    SEEK IMMEDIATE MEDICAL CARE IF:   · You feel confused.    · Your vision is blurry.  · You feel faint or pass out.    · You have a severe headache.    · You have severe abdominal, pelvic, or back pain.    · You have chest pain, shortness of breath, or an  irregular or fast heartbeat.    · You are unable to urinate or you urinate less than normal.    · You develop abnormal bleeding, such as bleeding from the rectum, vagina, nose, lungs, or nipples.  · You vomit blood.     · You have thoughts about harming yourself or committing suicide.    · You are worried that you might harm someone else.       This information is not intended to replace advice given to you by your health care provider. Make sure you discuss any questions you have with your health care provider.     Document Released: 01/21/2007 Document Revised: 04/16/2014 Document Reviewed: 07/28/2013  Elsevier Interactive Patient Education ©2016 [employer]

## 2015-05-06 MED ORDER — CEPHALEXIN 500 MG PO CAPS
ORAL_CAPSULE | ORAL | Status: AC
  Filled 2015-05-06: qty 1

## 2015-06-05 ENCOUNTER — Encounter: Payer: Self-pay | Admitting: Emergency Medicine

## 2015-06-05 ENCOUNTER — Emergency Department
Admission: EM | Admit: 2015-06-05 | Discharge: 2015-06-05 | Disposition: A | Discharge status: Home / Self Care | Payer: Medicaid Other | Attending: Emergency Medicine | Admitting: Emergency Medicine

## 2015-06-05 DIAGNOSIS — F17210 Nicotine dependence, cigarettes, uncomplicated: Secondary | ICD-10-CM | POA: Diagnosis not present

## 2015-06-05 DIAGNOSIS — J069 Acute upper respiratory infection, unspecified: Secondary | ICD-10-CM | POA: Diagnosis not present

## 2015-06-05 DIAGNOSIS — Z79899 Other long term (current) drug therapy: Secondary | ICD-10-CM | POA: Diagnosis not present

## 2015-06-05 DIAGNOSIS — J029 Acute pharyngitis, unspecified: Secondary | ICD-10-CM | POA: Diagnosis present

## 2015-06-05 LAB — RAPID INFLUENZA A&B ANTIGENS (ARMC ONLY)
INFLUENZA A (ARMC): NOT DETECTED
INFLUENZA B (ARMC): NOT DETECTED

## 2015-06-05 LAB — POCT RAPID STREP A: STREPTOCOCCUS, GROUP A SCREEN (DIRECT): NEGATIVE

## 2015-06-05 MED ORDER — AZITHROMYCIN 250 MG PO TABS: ORAL_TABLET | ORAL | Status: DC

## 2015-06-05 MED ORDER — ACETAMINOPHEN 500 MG PO TABS
1000.0000 mg | ORAL_TABLET | Freq: Once | ORAL | Status: AC
  Administered 2015-06-05: 1000 mg via ORAL
  Filled 2015-06-05: qty 2

## 2015-06-05 MED ORDER — BENZONATATE 100 MG PO CAPS: 100.0000 mg | ORAL_CAPSULE | Freq: Three times a day (TID) | ORAL | Status: DC | PRN

## 2015-06-05 NOTE — Discharge Instructions (Signed)
Upper Respiratory Infection, Adult Most upper respiratory infections (URIs) are a viral infection of the air passages leading to the lungs. A URI affects the nose, throat, and upper air passages. The most common type of URI is nasopharyngitis and is typically referred to as "the common cold." URIs run their course and usually go away on their own. Most of the time, a URI does not require medical attention, but sometimes a bacterial infection in the upper airways can follow a viral infection. This is called a secondary infection. Sinus and middle ear infections are common types of secondary upper respiratory infections. Bacterial pneumonia can also complicate a URI. A URI can worsen asthma and chronic obstructive pulmonary disease (COPD). Sometimes, these complications can require emergency medical care and may be life threatening.  CAUSES Almost all URIs are caused by viruses. A virus is a type of germ and can spread from one person to another.  RISKS FACTORS You may be at risk for a URI if:   You smoke.   You have chronic heart or lung disease.  You have a weakened defense (immune) system.   You are very young or very old.   You have nasal allergies or asthma.  You work in crowded or poorly ventilated areas.  You work in health care facilities or schools. SIGNS AND SYMPTOMS  Symptoms typically develop 2-3 days after you come in contact with a cold virus. Most viral URIs last 7-10 days. However, viral URIs from the influenza virus (flu virus) can last 14-18 days and are typically more severe. Symptoms may include:   Runny or stuffy (congested) nose.   Sneezing.   Cough.   Sore throat.   Headache.   Fatigue.   Fever.   Loss of appetite.   Pain in your forehead, behind your eyes, and over your cheekbones (sinus pain).  Muscle aches.  DIAGNOSIS  Your health care provider may diagnose a URI by:  Physical exam.  Tests to check that your symptoms are not due to  another condition such as:  Strep throat.  Sinusitis.  Pneumonia.  Asthma. TREATMENT  A URI goes away on its own with time. It cannot be cured with medicines, but medicines may be prescribed or recommended to relieve symptoms. Medicines may help:  Reduce your fever.  Reduce your cough.  Relieve nasal congestion. HOME CARE INSTRUCTIONS   Take medicines only as directed by your health care provider.   Gargle warm saltwater or take cough drops to comfort your throat as directed by your health care provider.  Use a warm mist humidifier or inhale steam from a shower to increase air moisture. This may make it easier to breathe.  Drink enough fluid to keep your urine clear or pale yellow.   Eat soups and other clear broths and maintain good nutrition.   Rest as needed.   Return to work when your temperature has returned to normal or as your health care provider advises. You may need to stay home longer to avoid infecting others. You can also use a face mask and careful hand washing to prevent spread of the virus.  Increase the usage of your inhaler if you have asthma.   Do not use any tobacco products, including cigarettes, chewing tobacco, or electronic cigarettes. If you need help quitting, ask your health care provider. PREVENTION  The best way to protect yourself from getting a cold is to practice good hygiene.   Avoid oral or hand contact with people with cold  symptoms.   Wash your hands often if contact occurs.  There is no clear evidence that vitamin C, vitamin E, echinacea, or exercise reduces the chance of developing a cold. However, it is always recommended to get plenty of rest, exercise, and practice good nutrition.  SEEK MEDICAL CARE IF:   You are getting worse rather than better.   Your symptoms are not controlled by medicine.   You have chills.  You have worsening shortness of breath.  You have brown or red mucus.  You have yellow or brown nasal  discharge.  You have pain in your face, especially when you bend forward.  You have a fever.  You have swollen neck glands.  You have pain while swallowing.  You have white areas in the back of your throat. SEEK IMMEDIATE MEDICAL CARE IF:   You have severe or persistent:  Headache.  Ear pain.  Sinus pain.  Chest pain.  You have chronic lung disease and any of the following:  Wheezing.  Prolonged cough.  Coughing up blood.  A change in your usual mucus.  You have a stiff neck.  You have changes in your:  Vision.  Hearing.  Thinking.  Mood. MAKE SURE YOU:   Understand these instructions.  Will watch your condition.  Will get help right away if you are not doing well or get worse.   This information is not intended to replace advice given to you by your health care provider. Make sure you discuss any questions you have with your health care provider.   Document Released: 09/19/2000 Document Revised: 08/10/2014 Document Reviewed: 07/01/2013 Elsevier Interactive Patient Education [employer]  Your influenza test was negative today. Continue to monitor and treat fevers. You are being treated for a possible infectious bronchitis. Take the Z-pack as directed. Start an OTC allergy medicine and cough medicine. Follow-up with your provider at [facility] as needed.

## 2015-06-05 NOTE — ED Notes (Signed)
Patient with no complaints at this time. Respirations even and unlabored. Skin warm/dry. Discharge instructions reviewed with patient at this time. Patient given opportunity to voice concerns/ask questions. Patient discharged at this time and left Emergency Department with steady gait.   

## 2015-06-05 NOTE — ED Notes (Signed)
Cough, fever, sore throat, achy - has not taken tylenol or motrin

## 2015-06-05 NOTE — ED Provider Notes (Signed)
[facility] Emergency Department Provider Note ____________________________________________  Time seen: 46  I have reviewed the triage vital signs and the nursing notes.  HISTORY  Chief Complaint  Sore Throat; Cough; and Facial Pain  HPI Ashley Howell is a 25 y.o. female presents to the ED for evaluation of 3 days onset of cough, fevers, sore throat. She also describes some generalized achiness associated with her symptoms. She describes she is taking over-the-counter cough medicine without significant benefit. She has not taken any Tylenol or Motrin since the onset of her symptoms. She denies any sick contacts, recent travel, or other exposures. She does endorse receiving the seasonal flu vaccine.She rates her overall generalized pain at a 10/10 in triage.  Past Medical History  Diagnosis Date  . Anxiety   . Shortness of breath dyspnea   . Heart defect   . GERD (gastroesophageal reflux disease)     Patient Active Problem List   Diagnosis Date Noted  . S/P cesarean section 02/22/2015  . Obesity in pregnancy, antepartum 02/15/2015  . Previous cesarean delivery, antepartum condition or complication 02/08/2015  . Atrial septal defect 01/13/2015  . Cramping affecting pregnancy, antepartum 12/08/2014  . Labor and delivery, indication for care 11/12/2014  . Obesity affecting pregnancy 10/28/2014  . History of cardiomegaly 10/28/2014  . First trimester screening     Past Surgical History  Procedure Laterality Date  . Cesarean section    . Cesarean section N/A 02/22/2015    Procedure: CESAREAN SECTION;  Surgeon: Nadara Mustard, MD;  Location: [facility] ORS;  Service: Obstetrics;  Laterality: N/A;    Current Outpatient Rx  Name  Route  Sig  Dispense  Refill  . acetaminophen (TYLENOL) 325 MG tablet   Oral   Take 2 tablets (650 mg total) by mouth every 4 (four) hours as needed (for pain scale < 4).         Marland Kitchen azithromycin (ZITHROMAX Z-PAK) 250 MG  tablet      Take 2 tablets (500 mg) on  Day 1,  followed by 1 tablet (250 mg) once daily on Days 2 through 5.   6 each   0   . benzonatate (TESSALON PERLES) 100 MG capsule   Oral   Take 1 capsule (100 mg total) by mouth 3 (three) times daily as needed for cough (Take 1-2 per dose).   30 capsule   0   . ferrous sulfate 325 (65 FE) MG tablet   Oral   Take 325 mg by mouth daily with breakfast.         . HYDROcodone-acetaminophen (NORCO/VICODIN) 5-325 MG tablet   Oral   Take 1 tablet by mouth every 4 (four) hours as needed for moderate pain or severe pain.   31 tablet   0   . ibuprofen (ADVIL,MOTRIN) 600 MG tablet   Oral   Take 1 tablet (600 mg total) by mouth every 6 (six) hours as needed for mild pain.   45 tablet   0    Allergies Review of patient's allergies indicates no known allergies.  No family history on file.  Social History Social History  Substance Use Topics  . Smoking status: Current Every Day Smoker -- 0.50 packs/day    Types: Cigarettes    Last Attempt to Quit: 05/23/2014  . Smokeless tobacco: Never Used  . Alcohol Use: No   Review of Systems  Constitutional: Positive for fever. Eyes: Negative for visual changes. ENT: Positive for sore throat. Cardiovascular: Negative  for chest pain. Respiratory: Negative for shortness of breath. Gastrointestinal: Negative for abdominal pain, vomiting and diarrhea. Genitourinary: Negative for dysuria. Musculoskeletal: Negative for back pain. Skin: Negative for rash. Neurological: Negative for headaches, focal weakness or numbness. ____________________________________________  PHYSICAL EXAM:  VITAL SIGNS: ED Triage Vitals  Enc Vitals Group     BP 06/05/15 1731 131/79 mmHg     Pulse Rate 06/05/15 1731 101     Resp 06/05/15 1731 18     Temp 06/05/15 1731 101 F (38.3 C)     Temp Source 06/05/15 1955 Oral     SpO2 06/05/15 1731 98 %     Weight 06/05/15 1731 260 lb (117.935 kg)     Height 06/05/15 1731 5'  6" (1.676 m)     Head Cir --      Peak Flow --      Pain Score 06/05/15 1732 10     Pain Loc --      Pain Edu? --      Excl. in GC? --    Constitutional: Alert and oriented. Well appearing and in no distress. Head: Normocephalic and atraumatic.      Eyes: Conjunctivae are normal. PERRL. Normal extraocular movements      Ears: Canals clear. TMs intact bilaterally.   Nose: No congestion/rhinorrhea.   Mouth/Throat: Mucous membranes are moist. Uvula is midline and tonsils are without erythema, edema, or exudate.   Neck: Supple. No thyromegaly. Hematological/Lymphatic/Immunological: No cervical lymphadenopathy. Cardiovascular: Normal rate, regular rhythm.  Respiratory: Normal respiratory effort. No wheezes/rales/rhonchi. Intermittent harsh cough during exam. Musculoskeletal: Nontender with normal range of motion in all extremities.  Neurologic:  Normal gait without ataxia. Normal speech and language. No gross focal neurologic deficits are appreciated. Skin:  Skin is warm, dry and intact. No rash noted. Psychiatric: Mood and affect are normal. Patient exhibits appropriate insight and judgment. ____________________________________________   LABS (pertinent positives/negatives) Labs Reviewed  RAPID INFLUENZA A&B ANTIGENS ([facility] ONLY)  POCT RAPID STREP A  ____________________________________________  PROCEDURES  Tylenol 1000 mg PO ____________________________________________  INITIAL IMPRESSION / ASSESSMENT AND PLAN / ED COURSE  Patient with acute respiratory infection which is benign at this point confirmed to be influenza. She will be discharged with a prescription for azithromycin to dose as directed. She is encouraged to continue with over-the-counter cough medicine as well as Jerilynn Som which are provided today for cough relief. She will continue to monitor and treat fevers as appropriate and push fluids to prevent dehydration. She will follow-up with primary care  provider or return to the ED for acutely worsening symptoms. ____________________________________________  FINAL CLINICAL IMPRESSION(S) / ED DIAGNOSES  Final diagnoses:  URI (upper respiratory infection)      Lissa Hoard, PA-C 06/05/15 2208  Sharyn Creamer, MD 06/05/15 2359

## 2015-06-08 LAB — CULTURE, GROUP A STREP (THRC)

## 2015-07-26 ENCOUNTER — Other Ambulatory Visit: Payer: Self-pay

## 2015-07-26 ENCOUNTER — Emergency Department
Admission: EM | Admit: 2015-07-26 | Discharge: 2015-07-26 | Disposition: A | Discharge status: Home / Self Care | Payer: Medicaid Other | Attending: Student | Admitting: Student

## 2015-07-26 ENCOUNTER — Encounter: Payer: Self-pay | Admitting: Emergency Medicine

## 2015-07-26 DIAGNOSIS — R079 Chest pain, unspecified: Secondary | ICD-10-CM | POA: Diagnosis present

## 2015-07-26 DIAGNOSIS — F17210 Nicotine dependence, cigarettes, uncomplicated: Secondary | ICD-10-CM | POA: Diagnosis not present

## 2015-07-26 DIAGNOSIS — Z5321 Procedure and treatment not carried out due to patient leaving prior to being seen by health care provider: Secondary | ICD-10-CM | POA: Insufficient documentation

## 2015-07-26 NOTE — ED Notes (Signed)
C/o cp rad to left arm and lower back pain.  Denies other sx.  NAD at this time.

## 2015-07-27 ENCOUNTER — Emergency Department: Payer: Medicaid Other

## 2015-07-27 ENCOUNTER — Emergency Department
Admission: EM | Admit: 2015-07-27 | Discharge: 2015-07-27 | Disposition: A | Discharge status: Home / Self Care | Payer: Medicaid Other | Attending: Student | Admitting: Student

## 2015-07-27 ENCOUNTER — Encounter: Payer: Self-pay | Admitting: Medical Oncology

## 2015-07-27 DIAGNOSIS — R079 Chest pain, unspecified: Secondary | ICD-10-CM | POA: Diagnosis not present

## 2015-07-27 DIAGNOSIS — F419 Anxiety disorder, unspecified: Secondary | ICD-10-CM | POA: Diagnosis not present

## 2015-07-27 DIAGNOSIS — R0600 Dyspnea, unspecified: Secondary | ICD-10-CM | POA: Insufficient documentation

## 2015-07-27 DIAGNOSIS — F17210 Nicotine dependence, cigarettes, uncomplicated: Secondary | ICD-10-CM | POA: Insufficient documentation

## 2015-07-27 DIAGNOSIS — R0602 Shortness of breath: Secondary | ICD-10-CM | POA: Diagnosis not present

## 2015-07-27 DIAGNOSIS — E669 Obesity, unspecified: Secondary | ICD-10-CM | POA: Diagnosis not present

## 2015-07-27 DIAGNOSIS — Z791 Long term (current) use of non-steroidal anti-inflammatories (NSAID): Secondary | ICD-10-CM | POA: Diagnosis not present

## 2015-07-27 DIAGNOSIS — K219 Gastro-esophageal reflux disease without esophagitis: Secondary | ICD-10-CM | POA: Insufficient documentation

## 2015-07-27 DIAGNOSIS — I517 Cardiomegaly: Secondary | ICD-10-CM | POA: Insufficient documentation

## 2015-07-27 LAB — CBC WITH DIFFERENTIAL/PLATELET
BASOS ABS: 0 10*3/uL (ref 0–0.1)
BASOS PCT: 0 %
Eosinophils Absolute: 0 10*3/uL (ref 0–0.7)
Eosinophils Relative: 1 %
HEMATOCRIT: 38.3 % (ref 35.0–47.0)
Hemoglobin: 12.5 g/dL (ref 12.0–16.0)
LYMPHS PCT: 26 %
Lymphs Abs: 2.3 10*3/uL (ref 1.0–3.6)
MCH: 24.2 pg — ABNORMAL LOW (ref 26.0–34.0)
MCHC: 32.7 g/dL (ref 32.0–36.0)
MCV: 73.9 fL — AB (ref 80.0–100.0)
Monocytes Absolute: 0.6 10*3/uL (ref 0.2–0.9)
Monocytes Relative: 7 %
NEUTROS ABS: 5.7 10*3/uL (ref 1.4–6.5)
Neutrophils Relative %: 66 %
PLATELETS: 305 10*3/uL (ref 150–440)
RBC: 5.18 MIL/uL (ref 3.80–5.20)
RDW: 15.4 % — ABNORMAL HIGH (ref 11.5–14.5)
WBC: 8.8 10*3/uL (ref 3.6–11.0)

## 2015-07-27 LAB — TROPONIN I: Troponin I: <0.03 ng/mL (ref <0.031)

## 2015-07-27 LAB — BASIC METABOLIC PANEL
ANION GAP: 7 (ref 5–15)
BUN: 13 mg/dL (ref 6–20)
CO2: 23 mmol/L (ref 22–32)
Calcium: 9.2 mg/dL (ref 8.9–10.3)
Chloride: 108 mmol/L (ref 101–111)
Creatinine, Ser: 0.72 mg/dL (ref 0.44–1.00)
GLUCOSE: 96 mg/dL (ref 65–99)
POTASSIUM: 3.8 mmol/L (ref 3.5–5.1)
Sodium: 138 mmol/L (ref 135–145)

## 2015-07-27 LAB — POCT PREGNANCY, URINE: PREG TEST UR: NEGATIVE

## 2015-07-27 MED ORDER — IBUPROFEN 600 MG PO TABS
600.0000 mg | ORAL_TABLET | Freq: Once | ORAL | Status: AC
  Administered 2015-07-27: 600 mg via ORAL
  Filled 2015-07-27: qty 1

## 2015-07-27 MED ORDER — IBUPROFEN 600 MG PO TABS: 600.0000 mg | ORAL_TABLET | Freq: Three times a day (TID) | ORAL | Status: DC | PRN

## 2015-07-27 NOTE — ED Notes (Signed)
Pt reports pressure pain to center of chest x 4 days. Pt was seen here yesterday with labs and ekg done.

## 2015-07-27 NOTE — ED Provider Notes (Signed)
[facility] Emergency Department Provider Note  ____________________________________________  Time seen: Approximately 9:27 AM  I have reviewed the triage vital signs and the nursing notes.   HISTORY  Chief Complaint Chest Pain    HPI Ashley Howell is a 25 y.o. female with history of anxiety, GERD, possible small ASD noted on echocardiogram in 2016 who presents for evaluation of 3-4 days constant sternal chest pain radiating under the left and right breasts, gradual onset, constant since onset, moderate, sometimes worse with movements. She denies any shortness of breath, lightheadedness, sudden sweating, nausea, vomiting, diarrhea, fevers or chills. No cough, sneezing, runny nose, sore throat. No family history of early coronary artery disease, no personal history of early coronary artery disease. No personal or family history of PE or DVT. No exogenous estrogen use, hemoptysis, leg swelling, recent surgeries or recent prolonged period of immobilization. No family history of sudden cardiac death.    Past Medical History  Diagnosis Date  . Anxiety   . Shortness of breath dyspnea   . Heart defect   . GERD (gastroesophageal reflux disease)     Patient Active Problem List   Diagnosis Date Noted  . S/P cesarean section 02/22/2015  . Obesity in pregnancy, antepartum 02/15/2015  . Previous cesarean delivery, antepartum condition or complication 02/08/2015  . Atrial septal defect 01/13/2015  . Cramping affecting pregnancy, antepartum 12/08/2014  . Labor and delivery, indication for care 11/12/2014  . Obesity affecting pregnancy 10/28/2014  . History of cardiomegaly 10/28/2014  . First trimester screening     Past Surgical History  Procedure Laterality Date  . Cesarean section    . Cesarean section N/A 02/22/2015    Procedure: CESAREAN SECTION;  Surgeon: Nadara Mustard, MD;  Location: [facility] ORS;  Service: Obstetrics;  Laterality: N/A;    Current  Outpatient Rx  Name  Route  Sig  Dispense  Refill  . acetaminophen (TYLENOL) 325 MG tablet   Oral   Take 2 tablets (650 mg total) by mouth every 4 (four) hours as needed (for pain scale < 4).         Marland Kitchen azithromycin (ZITHROMAX Z-PAK) 250 MG tablet      Take 2 tablets (500 mg) on  Day 1,  followed by 1 tablet (250 mg) once daily on Days 2 through 5.   6 each   0   . benzonatate (TESSALON PERLES) 100 MG capsule   Oral   Take 1 capsule (100 mg total) by mouth 3 (three) times daily as needed for cough (Take 1-2 per dose).   30 capsule   0   . ferrous sulfate 325 (65 FE) MG tablet   Oral   Take 325 mg by mouth daily with breakfast.         . HYDROcodone-acetaminophen (NORCO/VICODIN) 5-325 MG tablet   Oral   Take 1 tablet by mouth every 4 (four) hours as needed for moderate pain or severe pain.   31 tablet   0   . ibuprofen (ADVIL,MOTRIN) 600 MG tablet   Oral   Take 1 tablet (600 mg total) by mouth every 6 (six) hours as needed for mild pain.   45 tablet   0     Allergies Review of patient's allergies indicates no known allergies.  No family history on file.  Social History Social History  Substance Use Topics  . Smoking status: Current Every Day Smoker -- 0.50 packs/day    Types: Cigarettes    Last Attempt  to Quit: 05/23/2014  . Smokeless tobacco: Never Used  . Alcohol Use: No    Review of Systems Constitutional: No fever/chills Eyes: No visual changes. ENT: No sore throat. Cardiovascular: + chest pain. Respiratory: Denies shortness of breath. Gastrointestinal: No abdominal pain.  No nausea, no vomiting.  No diarrhea.  No constipation. Genitourinary: Negative for dysuria. Musculoskeletal: Negative for back pain. Skin: Negative for rash. Neurological: Negative for headaches, focal weakness or numbness.  10-point ROS otherwise negative.  ____________________________________________   PHYSICAL EXAM:  VITAL SIGNS: ED Triage Vitals  Enc Vitals Group      BP 07/27/15 0903 136/74 mmHg     Pulse Rate 07/27/15 0903 78     Resp 07/27/15 0903 18     Temp 07/27/15 0903 98.3 F (36.8 C)     Temp Source 07/27/15 0903 Oral     SpO2 07/27/15 0903 98 %     Weight 07/27/15 0903 250 lb (113.399 kg)     Height 07/27/15 0903 5\' 7"  (1.702 m)     Head Cir --      Peak Flow --      Pain Score 07/27/15 0903 7     Pain Loc --      Pain Edu? --      Excl. in GC? --     Constitutional: Alert and oriented. Well appearing and in no acute distress. Eyes: Conjunctivae are normal. PERRL. EOMI. Head: Atraumatic. Nose: No congestion/rhinnorhea. Mouth/Throat: Mucous membranes are moist.  Oropharynx non-erythematous. Neck: No stridor.  Supple without meningismus. Cardiovascular: Normal rate, regular rhythm. Grossly normal heart sounds.  Good peripheral circulation. Respiratory: Normal respiratory effort.  No retractions. Lungs CTAB. Gastrointestinal: Soft and nontender. No distention.  No CVA tenderness. Genitourinary: deferred Musculoskeletal: No lower extremity tenderness nor edema.  No joint effusions. No leg Swelling/asymmetry/edema. There is a palpation in the sternum and under the left breast/in the left inferior chest wall, palpation reproduces pain. Neurologic:  Normal speech and language. No gross focal neurologic deficits are appreciated. No gait instability. Skin:  Skin is warm, dry and intact. No rash noted. Psychiatric: Mood and affect are normal. Speech and behavior are normal.  ____________________________________________   LABS (all labs ordered are listed, but only abnormal results are displayed)  Labs Reviewed  CBC WITH DIFFERENTIAL/PLATELET - Abnormal; Notable for the following:    MCV 73.9 (*)    MCH 24.2 (*)    RDW 15.4 (*)    All other components within normal limits  BASIC METABOLIC PANEL  TROPONIN I  POC URINE PREG, ED  POCT PREGNANCY, URINE   ____________________________________________  EKG  ED ECG REPORT I, Gayla DossGayle,  Alinda Egolf A, the attending physician, personally viewed and interpreted this ECG.   Date: 07/27/2015  EKG Time: 09:06  Rate: 69  Rhythm: normal sinus rhythm  Axis: normal  Intervals:none  ST&T Change: Nonspecific  T-wave abnormality. EKG unchanged from 05/02/2015.  ____________________________________________  RADIOLOGY  CXR IMPRESSION: Negative, no acute cardiopulmonary abnormality.  ____________________________________________   PROCEDURES  Procedure(s) performed: None  Critical Care performed: No  ____________________________________________   INITIAL IMPRESSION / ASSESSMENT AND PLAN / ED COURSE  Pertinent labs & imaging results that were available during my care of the patient were reviewed by me and considered in my medical decision making (see chart for details).  Apolinar JunesSamantha S Tullos is a 25 y.o. female with history of anxiety, GERD, possible small ASD noted on echocardiogram in 2016 who presents for evaluation of 3-4 days constant sternal chest pain  radiating under the left and right breasts. On exam, she is well-appearing and in no acute distress. Sitting up in bed, smiling, talkative, pleasant. Vital signs are stable, she is afebrile. EKG is not consistent with acute ischemia and is unchanged from prior. Suspect that her pain may be muscle skeletal in nature as it is reproducible on my exam with palpation. She is PERC negative and I doubt PE. Clinical picture not consistent with acute aortic dissection. We'll obtain screening labs, treat her pain and anticipate discharge with anti-inflammatories and close follow-up.  ----------------------------------------- 10:56 AM on 07/27/2015 ----------------------------------------- Patient reports improvement of her pain at this time. Negative pregnancy test. Unremarkable CBC and BMP. Negative troponin. Discussed return precautions, need for close PCP follow-up and she is, comfortable with the discharge plan. DC  home.  ____________________________________________   FINAL CLINICAL IMPRESSION(S) / ED DIAGNOSES  Final diagnoses:  Chest pain, unspecified chest pain type      Gayla Doss, MD 07/27/15 1057

## 2015-09-28 ENCOUNTER — Other Ambulatory Visit: Payer: Self-pay

## 2015-09-28 ENCOUNTER — Emergency Department
Admission: EM | Admit: 2015-09-28 | Discharge: 2015-09-28 | Disposition: A | Discharge status: Home / Self Care | Payer: Medicaid Other | Attending: Emergency Medicine | Admitting: Emergency Medicine

## 2015-09-28 ENCOUNTER — Encounter: Payer: Self-pay | Admitting: Emergency Medicine

## 2015-09-28 ENCOUNTER — Emergency Department: Payer: Medicaid Other

## 2015-09-28 DIAGNOSIS — X500XXA Overexertion from strenuous movement or load, initial encounter: Secondary | ICD-10-CM | POA: Insufficient documentation

## 2015-09-28 DIAGNOSIS — Y9389 Activity, other specified: Secondary | ICD-10-CM | POA: Insufficient documentation

## 2015-09-28 DIAGNOSIS — F17210 Nicotine dependence, cigarettes, uncomplicated: Secondary | ICD-10-CM | POA: Insufficient documentation

## 2015-09-28 DIAGNOSIS — R0789 Other chest pain: Secondary | ICD-10-CM

## 2015-09-28 DIAGNOSIS — Z79899 Other long term (current) drug therapy: Secondary | ICD-10-CM | POA: Insufficient documentation

## 2015-09-28 DIAGNOSIS — Y929 Unspecified place or not applicable: Secondary | ICD-10-CM | POA: Insufficient documentation

## 2015-09-28 DIAGNOSIS — S29011A Strain of muscle and tendon of front wall of thorax, initial encounter: Secondary | ICD-10-CM

## 2015-09-28 DIAGNOSIS — Q211 Atrial septal defect: Secondary | ICD-10-CM | POA: Insufficient documentation

## 2015-09-28 DIAGNOSIS — Z792 Long term (current) use of antibiotics: Secondary | ICD-10-CM | POA: Insufficient documentation

## 2015-09-28 DIAGNOSIS — Y990 Civilian activity done for income or pay: Secondary | ICD-10-CM | POA: Insufficient documentation

## 2015-09-28 LAB — BASIC METABOLIC PANEL
Anion gap: 6 (ref 5–15)
BUN: 10 mg/dL (ref 6–20)
CHLORIDE: 109 mmol/L (ref 101–111)
CO2: 24 mmol/L (ref 22–32)
CREATININE: 0.75 mg/dL (ref 0.44–1.00)
Calcium: 9 mg/dL (ref 8.9–10.3)
GFR calc non Af Amer: >60 mL/min (ref >60)
Glucose, Bld: 89 mg/dL (ref 65–99)
POTASSIUM: 3.6 mmol/L (ref 3.5–5.1)
Sodium: 139 mmol/L (ref 135–145)

## 2015-09-28 LAB — CBC
HEMATOCRIT: 38.8 % (ref 35.0–47.0)
Hemoglobin: 13.1 g/dL (ref 12.0–16.0)
MCH: 25.8 pg — AB (ref 26.0–34.0)
MCHC: 33.7 g/dL (ref 32.0–36.0)
MCV: 76.5 fL — AB (ref 80.0–100.0)
PLATELETS: 250 10*3/uL (ref 150–440)
RBC: 5.08 MIL/uL (ref 3.80–5.20)
RDW: 17.7 % — ABNORMAL HIGH (ref 11.5–14.5)
WBC: 9.5 10*3/uL (ref 3.6–11.0)

## 2015-09-28 LAB — TROPONIN I: Troponin I: <0.03 ng/mL (ref <0.031)

## 2015-09-28 NOTE — ED Notes (Signed)
Patient presents to the ED with chest pain under her right breast that is worse with movement, and is tender on palpation.  Patient reports shortness of breath as well.  Patient has an atrial septal defect since birth but has appointment with cardiology on Friday because patient states, "I think it's giving me problems."  Patient is in no obvious distress at this time.

## 2015-09-28 NOTE — ED Provider Notes (Signed)
[facility] Emergency Department Provider Note  ____________________________________________  Time seen: 4:40 PM  I have reviewed the triage vital signs and the nursing notes.   HISTORY  Chief Complaint Chest Pain    HPI Ashley Howell is a 25 y.o. female who complains of pain in the lower anterior chest just right of the sternum is been going on for 2 weeks.. Worse with movement, worse with deep breath. It makes her feel like she can't breathe enough because of the pain, but does not actually feel like she is short of breath with air hunger. She works as a Dealer, frequently helping move and reposition patients. Lifting with her right arm aggravates the pain currently. Pain is not exertional, no diaphoresis or vomiting. No dizziness or syncope. No recent travel trauma hospitalization surgery. No history of DVT or PE.     Past Medical History  Diagnosis Date  . Anxiety   . Shortness of breath dyspnea   . Heart defect   . GERD (gastroesophageal reflux disease)   ASD   Patient Active Problem List   Diagnosis Date Noted  . S/P cesarean section 02/22/2015  . Obesity in pregnancy, antepartum 02/15/2015  . Previous cesarean delivery, antepartum condition or complication 02/08/2015  . Atrial septal defect 01/13/2015  . Cramping affecting pregnancy, antepartum 12/08/2014  . Labor and delivery, indication for care 11/12/2014  . Obesity affecting pregnancy 10/28/2014  . History of cardiomegaly 10/28/2014  . First trimester screening      Past Surgical History  Procedure Laterality Date  . Cesarean section    . Cesarean section N/A 02/22/2015    Procedure: CESAREAN SECTION;  Surgeon: Nadara Mustard, MD;  Location: [facility] ORS;  Service: Obstetrics;  Laterality: N/A;     Current Outpatient Rx  Name  Route  Sig  Dispense  Refill  . acetaminophen (TYLENOL) 325 MG tablet   Oral   Take 2 tablets (650 mg total) by mouth every 4 (four) hours  as needed (for pain scale < 4).         Marland Kitchen azithromycin (ZITHROMAX Z-PAK) 250 MG tablet      Take 2 tablets (500 mg) on  Day 1,  followed by 1 tablet (250 mg) once daily on Days 2 through 5.   6 each   0   . benzonatate (TESSALON PERLES) 100 MG capsule   Oral   Take 1 capsule (100 mg total) by mouth 3 (three) times daily as needed for cough (Take 1-2 per dose).   30 capsule   0   . ferrous sulfate 325 (65 FE) MG tablet   Oral   Take 325 mg by mouth daily with breakfast.         . HYDROcodone-acetaminophen (NORCO/VICODIN) 5-325 MG tablet   Oral   Take 1 tablet by mouth every 4 (four) hours as needed for moderate pain or severe pain.   31 tablet   0   . ibuprofen (ADVIL,MOTRIN) 600 MG tablet   Oral   Take 1 tablet (600 mg total) by mouth every 6 (six) hours as needed for mild pain.   45 tablet   0   . ibuprofen (ADVIL,MOTRIN) 600 MG tablet   Oral   Take 1 tablet (600 mg total) by mouth every 8 (eight) hours as needed for moderate pain.   15 tablet   0      Allergies Review of patient's allergies indicates no known allergies.   No family  history on file.  Social History Social History  Substance Use Topics  . Smoking status: Current Every Day Smoker -- 0.50 packs/day    Types: Cigarettes    Last Attempt to Quit: 05/23/2014  . Smokeless tobacco: Never Used  . Alcohol Use: No    Review of Systems  Constitutional:   No fever or chills. No night sweats Eyes:   No vision changes.  ENT:   No sore throat. No rhinorrhea. Cardiovascular:   Positive as above chest pain. Respiratory:   No dyspnea or cough. Gastrointestinal:   Negative for abdominal pain, vomiting and diarrhea.  Genitourinary:   Negative for dysuria or difficulty urinating. Musculoskeletal:   Negative for focal pain or swelling Neurological:   Negative for headaches 10-point ROS otherwise negative.  ____________________________________________   PHYSICAL EXAM:  VITAL SIGNS: ED Triage  Vitals  Enc Vitals Group     BP 09/28/15 1457 137/75 mmHg     Pulse Rate 09/28/15 1457 89     Resp 09/28/15 1457 16     Temp 09/28/15 1457 98.8 F (37.1 C)     Temp Source 09/28/15 1457 Oral     SpO2 09/28/15 1457 100 %     Weight 09/28/15 1457 250 lb (113.399 kg)     Height 09/28/15 1457 5\' 6"  (1.676 m)     Head Cir --      Peak Flow --      Pain Score 09/28/15 1458 9     Pain Loc --      Pain Edu? --      Excl. in GC? --     Vital signs reviewed, nursing assessments reviewed.   Constitutional:   Alert and oriented. Well appearing and in no distress. Eyes:   No scleral icterus. No conjunctival pallor. PERRL. EOMI.  No nystagmus. ENT   Head:   Normocephalic and atraumatic.   Nose:   No congestion/rhinnorhea. No septal hematoma   Mouth/Throat:   MMM, no pharyngeal erythema. No peritonsillar mass.    Neck:   No stridor. No SubQ emphysema. No meningismus. Hematological/Lymphatic/Immunilogical:   No cervical lymphadenopathy. Cardiovascular:   RRR. Symmetric bilateral radial and DP pulses.  No murmurs.  Respiratory:   Normal respiratory effort without tachypnea nor retractions. Breath sounds are clear and equal bilaterally. No wheezes/rales/rhonchi. Gastrointestinal:   Soft and nontender. Non distended. There is no CVA tenderness.  No rebound, rigidity, or guarding. Genitourinary:   deferred Musculoskeletal:   Nontender with normal range of motion in all extremities. No joint effusions.  No lower extremity tenderness.  No edema. Chest wall is exquisitely tender over to intercostal spaces just right of the sternum inferiorly. This exactly reproduces her symptoms. No evidence of soft tissue infection in this area, no wounds. Neurologic:   Normal speech and language.  CN 2-10 normal. Motor grossly intact. No gross focal neurologic deficits are appreciated.  Skin:    Skin is warm, dry and intact. No rash noted.  No petechiae, purpura, or  bullae.  ____________________________________________    LABS (pertinent positives/negatives) (all labs ordered are listed, but only abnormal results are displayed) Labs Reviewed  CBC - Abnormal; Notable for the following:    MCV 76.5 (*)    MCH 25.8 (*)    RDW 17.7 (*)    All other components within normal limits  BASIC METABOLIC PANEL  TROPONIN I   ____________________________________________   EKG  Interpreted by me Normal sinus rhythm rate of 89, normal axis and intervals. Normal  QRS. Normal ST segments. T wave inversion in lead 3, unchanged from 07/28/2015 ____________________________________________    RADIOLOGY  Chest x-ray unremarkable  ____________________________________________   PROCEDURES   ____________________________________________   INITIAL IMPRESSION / ASSESSMENT AND PLAN / ED COURSE  Pertinent labs & imaging results that were available during my care of the patient were reviewed by me and considered in my medical decision making (see chart for details).  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 Recommend NSAIDs, rest. Follow-up with primary care.       ____________________________________________   FINAL CLINICAL IMPRESSION(S) / ED DIAGNOSES  Final diagnoses:  Chest wall pain  Intercostal muscle strain, initial encounter       Portions of this note were generated with dragon dictation software. Dictation errors may occur despite best attempts at proofreading.   Sharman CheekPhillip Delight Bickle, MD 09/28/15 [phone]

## 2015-09-28 NOTE — Discharge Instructions (Signed)

## 2015-10-11 ENCOUNTER — Emergency Department
Admission: EM | Admit: 2015-10-11 | Discharge: 2015-10-11 | Disposition: A | Discharge status: Home / Self Care | Payer: Medicaid Other | Attending: Student | Admitting: Student

## 2015-10-11 DIAGNOSIS — Z792 Long term (current) use of antibiotics: Secondary | ICD-10-CM | POA: Insufficient documentation

## 2015-10-11 DIAGNOSIS — J039 Acute tonsillitis, unspecified: Secondary | ICD-10-CM

## 2015-10-11 DIAGNOSIS — F17210 Nicotine dependence, cigarettes, uncomplicated: Secondary | ICD-10-CM | POA: Insufficient documentation

## 2015-10-11 DIAGNOSIS — Q211 Atrial septal defect: Secondary | ICD-10-CM | POA: Insufficient documentation

## 2015-10-11 MED ORDER — AMOXICILLIN 500 MG PO CAPS: 500.0000 mg | ORAL_CAPSULE | Freq: Three times a day (TID) | ORAL | Status: DC

## 2015-10-11 NOTE — ED Provider Notes (Signed)
[facility] Emergency Department Provider Note   ____________________________________________  Time seen: Approximately 1:37 PM  I have reviewed the triage vital signs and the nursing notes.   HISTORY  Chief Complaint Sore Throat; Otalgia; Ear Drainage; Cough; and Fatigue    HPI Ashley Howell is a 25 y.o. female is here with complaint of sore throat, productive cough and left ear pain for approximately one week. Patient states that it started 1 week ago got better for one day and then returned. Patient unaware of any fever. She denies any exposure to strep throat. Patient denies any nausea, vomiting or abdominal pain. Patient does smoke approximately one half pack cigarettes per day. Patient rates her pain as 7 out of 10.   Past Medical History  Diagnosis Date  . Anxiety   . Shortness of breath dyspnea   . Heart defect   . GERD (gastroesophageal reflux disease)     Patient Active Problem List   Diagnosis Date Noted  . S/P cesarean section 02/22/2015  . Obesity in pregnancy, antepartum 02/15/2015  . Previous cesarean delivery, antepartum condition or complication 02/08/2015  . Atrial septal defect 01/13/2015  . Cramping affecting pregnancy, antepartum 12/08/2014  . Labor and delivery, indication for care 11/12/2014  . Obesity affecting pregnancy 10/28/2014  . History of cardiomegaly 10/28/2014  . First trimester screening     Past Surgical History  Procedure Laterality Date  . Cesarean section    . Cesarean section N/A 02/22/2015    Procedure: CESAREAN SECTION;  Surgeon: Nadara Mustardobert P Harris, MD;  Location: [facility] ORS;  Service: Obstetrics;  Laterality: N/A;    Current Outpatient Rx  Name  Route  Sig  Dispense  Refill  . acetaminophen (TYLENOL) 325 MG tablet   Oral   Take 2 tablets (650 mg total) by mouth every 4 (four) hours as needed (for pain scale < 4).         . amoxicillin (AMOXIL) 500 MG capsule   Oral   Take 1 capsule (500 mg  total) by mouth 3 (three) times daily.   30 capsule   0   . azithromycin (ZITHROMAX Z-PAK) 250 MG tablet      Take 2 tablets (500 mg) on  Day 1,  followed by 1 tablet (250 mg) once daily on Days 2 through 5.   6 each   0   . benzonatate (TESSALON PERLES) 100 MG capsule   Oral   Take 1 capsule (100 mg total) by mouth 3 (three) times daily as needed for cough (Take 1-2 per dose).   30 capsule   0   . ferrous sulfate 325 (65 FE) MG tablet   Oral   Take 325 mg by mouth daily with breakfast.           Allergies Review of patient's allergies indicates no known allergies.  No family history on file.  Social History Social History  Substance Use Topics  . Smoking status: Current Every Day Smoker -- 0.50 packs/day    Types: Cigarettes    Last Attempt to Quit: 05/23/2014  . Smokeless tobacco: Never Used  . Alcohol Use: No    Review of Systems Constitutional: No fever/chills Eyes: No visual changes. ENT: Positive sore throat. Positive left ear pain. Cardiovascular: Denies chest pain. Respiratory: Denies shortness of breath. Positive productive cough. Gastrointestinal:  No nausea, no vomiting.   Musculoskeletal: Negative for back pain. Skin: Negative for rash. Neurological: Negative for headaches, focal weakness or numbness.  10-point ROS otherwise negative.  ____________________________________________   PHYSICAL EXAM:  VITAL SIGNS: ED Triage Vitals  Enc Vitals Group     BP 10/11/15 1317 139/90 mmHg     Pulse Rate 10/11/15 1317 87     Resp 10/11/15 1317 16     Temp 10/11/15 1317 98.4 F (36.9 C)     Temp Source 10/11/15 1317 Oral     SpO2 10/11/15 1317 98 %     Weight 10/11/15 1317 250 lb (113.399 kg)     Height 10/11/15 1317 5\' 7"  (1.702 m)     Head Cir --      Peak Flow --      Pain Score 10/11/15 1318 7     Pain Loc --      Pain Edu? --      Excl. in GC? --     Constitutional: Alert and oriented. Well appearing and in no acute distress. Eyes:  Conjunctivae are normal. PERRL. EOMI. Head: Atraumatic. Nose: No congestion/rhinnorhea.EACs and TMs are clear bilaterally.There is no tenderness  of the sinus areas on percussion. Mouth/Throat: Mucous membranes are moist.  Oropharynx non-erythematous. Bilateral tonsils are cryptic with white material without erythema. Neck: No stridor.   Hematological/Lymphatic/Immunilogical: Minimal bilateral cervical lymphadenopathy. Cardiovascular: Normal rate, regular rhythm. Grossly normal heart sounds.  Good peripheral circulation. Respiratory: Normal respiratory effort.  No retractions. Lungs CTAB. Musculoskeletal: No lower extremity tenderness nor edema.  No joint effusions. Neurologic:  Normal speech and language. No gross focal neurologic deficits are appreciated.  Skin:  Skin is warm, dry and intact. No rash noted. Psychiatric: Mood and affect are normal. Speech and behavior are normal.  ____________________________________________   LABS (all labs ordered are listed, but only abnormal results are displayed)  Labs Reviewed  CULTURE, GROUP A STREP [facility]     PROCEDURES  Procedure(s) performed: None  Procedures  Critical Care performed: No  ____________________________________________   INITIAL IMPRESSION / ASSESSMENT AND PLAN / ED COURSE  Pertinent labs & imaging results that were available during my care of the patient were reviewed by me and considered in my medical decision making (see chart for details).  Patient is aware that she has cryptic tonsils and there is possibility that this is food that she is seeing as white places on her tonsils. Patient strep test was negative however she was placed on amoxicillin 500 mg 3 times a day for 10 days. She is encouraged to get an appointment with [facility] for evaluation of her acute and chronic tonsillitis. ____________________________________________   FINAL CLINICAL IMPRESSION(S) / ED DIAGNOSES  Final diagnoses:  Acute  tonsillitis, unspecified etiology      NEW MEDICATIONS STARTED DURING THIS VISIT:  Discharge Medication List as of 10/11/2015  2:25 PM    START taking these medications   Details  amoxicillin (AMOXIL) 500 MG capsule Take 1 capsule (500 mg total) by mouth 3 (three) times daily., Starting 10/11/2015, Until Discontinued, Print         Note:  This document was prepared using Dragon voice recognition software and may include unintentional dictation errors.    Tommi Rumpshonda L Summers, PA-C 10/11/15 1431  Gayla DossEryka A Gayle, MD 10/11/15 1539

## 2015-10-11 NOTE — ED Notes (Signed)
POCT Strep NEGATIVE 

## 2015-10-11 NOTE — ED Notes (Signed)
Pt presents ambulatory to triage with reports of sore throat pain, cough and ear drainage for about one week

## 2015-10-11 NOTE — Discharge Instructions (Signed)
Tonsillitis Tonsillitis is an infection of the throat. This infection causes the tonsils to become red, tender, and puffy (swollen). Tonsils are groups of tissue at the back of your throat. If bacteria caused your infection, antibiotic medicine will be given to you. Sometimes symptoms of tonsillitis can be relieved with the use of steroid medicine. If your tonsillitis is severe and happens often, you may need to get your tonsils removed (tonsillectomy). HOME CARE   Rest and sleep often.  Drink enough fluids to keep your pee (urine) clear or pale yellow.  While your throat is sore, eat soft or liquid foods like:  Soup.  Ice cream.  Instant breakfast drinks.  Eat frozen ice pops.  Gargle with a warm or cold liquid to help soothe the throat. Gargle with a water and salt mix. Mix 1/4 teaspoon of salt and 1/4 teaspoon of baking soda in 1 cup of water.  Only take medicines as told by your doctor.  If you are given medicines (antibiotics), take them as told. Finish them even if you start to feel better. GET HELP IF:  You have large, tender lumps in your neck.  You have a rash.  You cough up green, yellow-brown, or bloody fluid.  You cannot swallow liquids or food for 24 hours.  You notice that only one of your tonsils is swollen. GET HELP RIGHT AWAY IF:   You throw up (vomit).  You have a very bad headache.  You have a stiff neck.  You have chest pain.  You have trouble breathing or swallowing.  You have bad throat pain, drooling, or your voice changes.  You have bad pain not helped by medicine.  You cannot fully open your mouth.  You have redness, puffiness, or bad pain in the neck.  You have a fever. MAKE SURE YOU:   Understand these instructions.  Will watch your condition.  Will get help right away if you are not doing well or get worse.   This information is not intended to replace advice given to you by your health care provider. Make sure you discuss any  questions you have with your health care provider.   Document Released: 09/12/2007 Document Revised: 03/31/2013 Document Reviewed: 09/12/2012 Elsevier Interactive Patient Education [employer]2016 [employer]vier Inc.    Follow-up with your doctor or [facility] for further evaluation of your tonsils. Begin taking amoxicillin 500 mg 3 times a day for 10 days. He may also take Tylenol or ibuprofen as needed for throat pain as needed. Continue drinking fluids frequently.

## 2015-10-14 LAB — CULTURE, GROUP A STREP (THRC)

## 2015-10-15 ENCOUNTER — Telehealth (HOSPITAL_BASED_OUTPATIENT_CLINIC_OR_DEPARTMENT_OTHER): Payer: Self-pay

## 2015-10-15 NOTE — Telephone Encounter (Signed)
Post ED Visit - Positive Culture Follow-up  Culture report reviewed by antimicrobial stewardship pharmacist:  []  Enzo BiNathan Batchelder, Pharm.D. []  Celedonio MiyamotoJeremy Frens, Pharm.D., BCPS []  Garvin FilaMike Maccia, Pharm.D. []  Georgina PillionElizabeth Martin, Pharm.D., BCPS []  ReginaMinh Pham, [address]Ph[address]D., BCPS, AAHIVP []  Estella HuskMichelle Turner, Pharm.D., BCPS, AAHIVP []  Cassie Stewart, Pharm.D. []  Rob Oswaldo DoneVincent, [address]Sydnee Cabal. Jennifer Marple Pharm D Positive throat culture Treated with amoxicillin, organism sensitive to the same and no further patient follow-up is required at this time.  Jerry CarasCullom, Mayan Kloepfer Burnett 10/15/2015, 10:37 AM

## 2015-12-18 ENCOUNTER — Encounter: Payer: Self-pay | Admitting: *Deleted

## 2015-12-18 ENCOUNTER — Emergency Department: Payer: Medicaid Other

## 2015-12-18 ENCOUNTER — Emergency Department
Admission: EM | Admit: 2015-12-18 | Discharge: 2015-12-18 | Disposition: A | Discharge status: Home / Self Care | Payer: Medicaid Other | Attending: Emergency Medicine | Admitting: Emergency Medicine

## 2015-12-18 DIAGNOSIS — R0789 Other chest pain: Secondary | ICD-10-CM

## 2015-12-18 DIAGNOSIS — Z792 Long term (current) use of antibiotics: Secondary | ICD-10-CM | POA: Insufficient documentation

## 2015-12-18 DIAGNOSIS — Z791 Long term (current) use of non-steroidal anti-inflammatories (NSAID): Secondary | ICD-10-CM | POA: Insufficient documentation

## 2015-12-18 DIAGNOSIS — F17210 Nicotine dependence, cigarettes, uncomplicated: Secondary | ICD-10-CM | POA: Insufficient documentation

## 2015-12-18 LAB — CBC
HEMATOCRIT: 40.1 % (ref 35.0–47.0)
Hemoglobin: 13.5 g/dL (ref 12.0–16.0)
MCH: 26.5 pg (ref 26.0–34.0)
MCHC: 33.7 g/dL (ref 32.0–36.0)
MCV: 78.8 fL — AB (ref 80.0–100.0)
Platelets: 263 10*3/uL (ref 150–440)
RBC: 5.09 MIL/uL (ref 3.80–5.20)
RDW: 14.9 % — AB (ref 11.5–14.5)
WBC: 8.2 10*3/uL (ref 3.6–11.0)

## 2015-12-18 LAB — BASIC METABOLIC PANEL
Anion gap: 7 (ref 5–15)
BUN: 12 mg/dL (ref 6–20)
CHLORIDE: 106 mmol/L (ref 101–111)
CO2: 24 mmol/L (ref 22–32)
Calcium: 9.1 mg/dL (ref 8.9–10.3)
Creatinine, Ser: 0.71 mg/dL (ref 0.44–1.00)
GFR calc Af Amer: >60 mL/min (ref >60)
GFR calc non Af Amer: >60 mL/min (ref >60)
GLUCOSE: 87 mg/dL (ref 65–99)
POTASSIUM: 3.6 mmol/L (ref 3.5–5.1)
Sodium: 137 mmol/L (ref 135–145)

## 2015-12-18 LAB — TROPONIN I: Troponin I: <0.03 ng/mL (ref <0.03)

## 2015-12-18 LAB — POCT PREGNANCY, URINE: Preg Test, Ur: NEGATIVE

## 2015-12-18 MED ORDER — KETOROLAC TROMETHAMINE 30 MG/ML IJ SOLN
30.0000 mg | Freq: Once | INTRAMUSCULAR | Status: AC
Start: 2015-12-18 — End: 2015-12-18
  Administered 2015-12-18: 30 mg via INTRAVENOUS
  Filled 2015-12-18: qty 1

## 2015-12-18 MED ORDER — IBUPROFEN 600 MG PO TABS: 600.0000 mg | ORAL_TABLET | Freq: Three times a day (TID) | ORAL | 0 refills | Status: DC | PRN

## 2015-12-18 NOTE — ED Triage Notes (Signed)
States left chest pain and breast pain, states hx of ASD, denies any SOB or n/v, pt awake and alert in no acute distress

## 2015-12-18 NOTE — ED Provider Notes (Signed)
[facility] Emergency Department Provider Note   ____________________________________________   I have reviewed the triage vital signs and the nursing notes.   HISTORY  Chief Complaint Chest Pain   History limited by: Not Limited   HPI Ashley JunesSamantha S Howell is a 25 y.o. female who presents to the emergency department today because of concern for chest pain. It started a few days ago. Located in the central and left upper chest pain. It is intermittent. It is worse with certain positions and movements of the arm. The patient has had chest pain in the past. Denies any fevers.   Past Medical History:  Diagnosis Date  . Anxiety   . GERD (gastroesophageal reflux disease)   . Heart defect   . Shortness of breath dyspnea     Patient Active Problem List   Diagnosis Date Noted  . S/P cesarean section 02/22/2015  . Obesity in pregnancy, antepartum 02/15/2015  . Previous cesarean delivery, antepartum condition or complication 02/08/2015  . Atrial septal defect 01/13/2015  . Cramping affecting pregnancy, antepartum 12/08/2014  . Labor and delivery, indication for care 11/12/2014  . Obesity affecting pregnancy 10/28/2014  . History of cardiomegaly 10/28/2014  . First trimester screening     Past Surgical History:  Procedure Laterality Date  . CESAREAN SECTION    . CESAREAN SECTION N/A 02/22/2015   Procedure: CESAREAN SECTION;  Surgeon: Nadara Mustardobert P Harris, MD;  Location: [facility] ORS;  Service: Obstetrics;  Laterality: N/A;    Prior to Admission medications   Medication Sig Start Date End Date Taking? Authorizing Provider  acetaminophen (TYLENOL) 325 MG tablet Take 2 tablets (650 mg total) by mouth every 4 (four) hours as needed (for pain scale < 4). 02/25/15   Chelsea C Ward, MD  amoxicillin (AMOXIL) 500 MG capsule Take 1 capsule (500 mg total) by mouth 3 (three) times daily. 10/11/15   Tommi Rumpshonda L Summers, PA-C  azithromycin (ZITHROMAX Z-PAK) 250 MG tablet Take 2  tablets (500 mg) on  Day 1,  followed by 1 tablet (250 mg) once daily on Days 2 through 5. 06/05/15   Jenise V Bacon Menshew, PA-C  benzonatate (TESSALON PERLES) 100 MG capsule Take 1 capsule (100 mg total) by mouth 3 (three) times daily as needed for cough (Take 1-2 per dose). 06/05/15   Jenise V Bacon Menshew, PA-C  ferrous sulfate 325 (65 FE) MG tablet Take 325 mg by mouth daily with breakfast.    Historical Provider, MD    Allergies Review of patient's allergies indicates no known allergies.  History reviewed. No pertinent family history.  Social History Social History  Substance Use Topics  . Smoking status: Current Every Day Smoker    Packs/day: 0.50    Types: Cigarettes    Last attempt to quit: 05/23/2014  . Smokeless tobacco: Never Used  . Alcohol use No    Review of Systems  Constitutional: Negative for fever. Cardiovascular: Positive for chest pain. Respiratory: Negative for shortness of breath. Gastrointestinal: Negative for abdominal pain, vomiting and diarrhea. Neurological: Negative for headaches, focal weakness or numbness.  10-point ROS otherwise negative.  ____________________________________________   PHYSICAL EXAM:  VITAL SIGNS: ED Triage Vitals [12/18/15 0717]  Enc Vitals Group     BP 135/71     Pulse Rate 71     Resp 18     Temp 98.1 F (36.7 C)     Temp Source Oral     SpO2 100 %     Weight 250 lb (113.4  kg)     Height 5\' 7"  (1.702 m)     Head Circumference      Peak Flow      Pain Score 8   Constitutional: Alert and oriented. Well appearing and in no distress. Eyes: Conjunctivae are normal. Normal extraocular movements. ENT   Head: Normocephalic and atraumatic.   Nose: No congestion/rhinnorhea.   Mouth/Throat: Mucous membranes are moist.   Neck: No stridor. Hematological/Lymphatic/Immunilogical: No cervical lymphadenopathy. Cardiovascular: Normal rate, regular rhythm.  No murmurs, rubs, or gallops. Respiratory: Normal  respiratory effort without tachypnea nor retractions. Breath sounds are clear and equal bilaterally. No wheezes/rales/rhonchi. Gastrointestinal: Soft and nontender. No distention.  Genitourinary: Deferred Musculoskeletal: Normal range of motion in all extremities. No lower extremity edema. Tender to palpation over the left sternal order. Neurologic:  Normal speech and language. No gross focal neurologic deficits are appreciated.  Skin:  Skin is warm, dry and intact. No rash noted. Psychiatric: Mood and affect are normal. Speech and behavior are normal. Patient exhibits appropriate insight and judgment.  ____________________________________________    LABS (pertinent positives/negatives)  Labs Reviewed  CBC - Abnormal; Notable for the following:       Result Value   MCV 78.8 (*)    RDW 14.9 (*)    All other components within normal limits  BASIC METABOLIC PANEL  TROPONIN I  POCT PREGNANCY, URINE     ____________________________________________   EKG  I, Phineas Semen, attending physician, personally viewed and interpreted this EKG  EKG Time: 0713 Rate: 70 Rhythm: normal sinus rhythm Axis: normal Intervals: qtc 425 QRS: narrow ST changes: no st elevation Impression: normal ekg   ____________________________________________    RADIOLOGY  CXR IMPRESSION:  No active cardiopulmonary disease.     ____________________________________________   PROCEDURES  Procedures  ____________________________________________   INITIAL IMPRESSION / ASSESSMENT AND PLAN / ED COURSE  Pertinent labs & imaging results that were available during my care of the patient were reviewed by me and considered in my medical decision making (see chart for details).  Patient presented to the emergency department today because of concerns for chest pain. On exam patient appears distressed. She was tender to palpation of the left sternal border and this did reproduce the pain the patient  was experiencing. This point I think likely pain costochondritis. Patient's blood work EKG and chest x-ray without concerning findings. Will discharge home with high-dose ibuprofen. ____________________________________________   FINAL CLINICAL IMPRESSION(S) / ED DIAGNOSES  Final diagnoses:  Chest wall pain     Note: This dictation was prepared with Dragon dictation. Any transcriptional errors that result from this process are unintentional    Phineas Semen, MD 12/18/15 1320

## 2015-12-18 NOTE — Discharge Instructions (Signed)
Please seek medical attention for any high fevers, chest pain, shortness of breath, change in behavior, persistent vomiting, bloody stool or any other new or concerning symptoms.  

## 2015-12-18 NOTE — ED Notes (Signed)
Patient states that she has been having left sided chest pain for the past few weeks, patient is also having pain in her left breast. Chest pain became worse last night at work. Patient states that the pain is constant. Patient denies any associated symptoms with the chest pain. Patient does not appear to be in any distress at this time.

## 2016-02-13 ENCOUNTER — Encounter: Payer: Self-pay | Admitting: Medical Oncology

## 2016-02-13 ENCOUNTER — Emergency Department: Payer: Self-pay

## 2016-02-13 ENCOUNTER — Emergency Department
Admission: EM | Admit: 2016-02-13 | Discharge: 2016-02-13 | Disposition: A | Discharge status: Home / Self Care | Payer: Self-pay | Attending: Emergency Medicine | Admitting: Emergency Medicine

## 2016-02-13 DIAGNOSIS — F17210 Nicotine dependence, cigarettes, uncomplicated: Secondary | ICD-10-CM | POA: Insufficient documentation

## 2016-02-13 DIAGNOSIS — Z7982 Long term (current) use of aspirin: Secondary | ICD-10-CM | POA: Insufficient documentation

## 2016-02-13 DIAGNOSIS — R102 Pelvic and perineal pain unspecified side: Secondary | ICD-10-CM

## 2016-02-13 DIAGNOSIS — N83201 Unspecified ovarian cyst, right side: Secondary | ICD-10-CM

## 2016-02-13 LAB — COMPREHENSIVE METABOLIC PANEL
ALT: 26 U/L (ref 14–54)
ANION GAP: 6 (ref 5–15)
AST: 30 U/L (ref 15–41)
Albumin: 3.4 g/dL — ABNORMAL LOW (ref 3.5–5.0)
Alkaline Phosphatase: 59 U/L (ref 38–126)
BUN: 10 mg/dL (ref 6–20)
CHLORIDE: 106 mmol/L (ref 101–111)
CO2: 23 mmol/L (ref 22–32)
Calcium: 8.7 mg/dL — ABNORMAL LOW (ref 8.9–10.3)
Creatinine, Ser: 0.75 mg/dL (ref 0.44–1.00)
GFR calc non Af Amer: >60 mL/min (ref >60)
Glucose, Bld: 74 mg/dL (ref 65–99)
POTASSIUM: 3.3 mmol/L — AB (ref 3.5–5.1)
SODIUM: 135 mmol/L (ref 135–145)
Total Bilirubin: 0.6 mg/dL (ref 0.3–1.2)
Total Protein: 6.7 g/dL (ref 6.5–8.1)

## 2016-02-13 LAB — CBC WITH DIFFERENTIAL/PLATELET
Basophils Absolute: 0 10*3/uL (ref 0–0.1)
Basophils Relative: 0 %
EOS ABS: 0.1 10*3/uL (ref 0–0.7)
EOS PCT: 1 %
HCT: 38.6 % (ref 35.0–47.0)
Hemoglobin: 13.3 g/dL (ref 12.0–16.0)
LYMPHS ABS: 2 10*3/uL (ref 1.0–3.6)
Lymphocytes Relative: 26 %
MCH: 27.1 pg (ref 26.0–34.0)
MCHC: 34.4 g/dL (ref 32.0–36.0)
MCV: 78.8 fL — ABNORMAL LOW (ref 80.0–100.0)
MONO ABS: 0.6 10*3/uL (ref 0.2–0.9)
Monocytes Relative: 9 %
Neutro Abs: 4.8 10*3/uL (ref 1.4–6.5)
Neutrophils Relative %: 64 %
PLATELETS: 259 10*3/uL (ref 150–440)
RBC: 4.89 MIL/uL (ref 3.80–5.20)
RDW: 15.1 % — AB (ref 11.5–14.5)
WBC: 7.5 10*3/uL (ref 3.6–11.0)

## 2016-02-13 LAB — URINALYSIS COMPLETE WITH MICROSCOPIC (ARMC ONLY)
BACTERIA UA: NONE SEEN
Bilirubin Urine: NEGATIVE
Glucose, UA: NEGATIVE mg/dL
HGB URINE DIPSTICK: NEGATIVE
KETONES UR: NEGATIVE mg/dL
LEUKOCYTES UA: NEGATIVE
NITRITE: NEGATIVE
PROTEIN: NEGATIVE mg/dL
RBC / HPF: NONE SEEN RBC/hpf (ref 0–5)
SPECIFIC GRAVITY, URINE: 1.026 (ref 1.005–1.030)
pH: 5 (ref 5.0–8.0)

## 2016-02-13 LAB — WET PREP, GENITAL
Clue Cells Wet Prep HPF POC: NONE SEEN
SPERM: NONE SEEN
Trich, Wet Prep: NONE SEEN
Yeast Wet Prep HPF POC: NONE SEEN

## 2016-02-13 LAB — POCT PREGNANCY, URINE: PREG TEST UR: NEGATIVE

## 2016-02-13 LAB — CHLAMYDIA/NGC RT PCR (ARMC ONLY)
Chlamydia Tr: NOT DETECTED
N gonorrhoeae: NOT DETECTED

## 2016-02-13 MED ORDER — KETOROLAC TROMETHAMINE 30 MG/ML IJ SOLN
INTRAMUSCULAR | Status: AC
  Administered 2016-02-13: 30 mg via INTRAVENOUS
  Filled 2016-02-13: qty 1

## 2016-02-13 MED ORDER — KETOROLAC TROMETHAMINE 30 MG/ML IJ SOLN
30.0000 mg | Freq: Once | INTRAMUSCULAR | Status: AC
  Administered 2016-02-13: 30 mg via INTRAVENOUS

## 2016-02-13 MED ORDER — IBUPROFEN 600 MG PO TABS: 600.0000 mg | ORAL_TABLET | Freq: Three times a day (TID) | ORAL | 0 refills | Status: DC | PRN

## 2016-02-13 MED ORDER — OXYCODONE-ACETAMINOPHEN 5-325 MG PO TABS: 2.0000 | ORAL_TABLET | Freq: Four times a day (QID) | ORAL | 0 refills | Status: DC | PRN

## 2016-02-13 MED ORDER — OXYCODONE-ACETAMINOPHEN 5-325 MG PO TABS
2.0000 | ORAL_TABLET | Freq: Once | ORAL | Status: DC
  Filled 2016-02-13: qty 2

## 2016-02-13 NOTE — ED Notes (Signed)
Patient returned from US.

## 2016-02-13 NOTE — ED Triage Notes (Signed)
Pt reports lower abd pain that began Saturday, no other sx's. Also reports back pain.

## 2016-02-13 NOTE — ED Notes (Signed)
Patient transported to US 

## 2016-02-13 NOTE — ED Provider Notes (Signed)
[facility] Emergency Department Provider Note        Time seen: ----------------------------------------- 7:54 AM on 02/13/2016 -----------------------------------------    I have reviewed the triage vital signs and the nursing notes.   HISTORY  Chief Complaint Abdominal Pain    HPI Ashley JunesSamantha S Howell is a 25 y.o. female who presents to the ER for lower abdominal pain since Saturday. She has never had pain like this before, recently had sexual activity and this may have affected the pain. She denies vaginal bleeding or discharge, is not sure if she is at risk for STD. She denies fevers, chills, vomiting or diarrhea. Movement seems to make the pain worse.   Past Medical History:  Diagnosis Date  . Anxiety   . GERD (gastroesophageal reflux disease)   . Heart defect   . Shortness of breath dyspnea     Patient Active Problem List   Diagnosis Date Noted  . S/P cesarean section 02/22/2015  . Obesity in pregnancy, antepartum 02/15/2015  . Previous cesarean delivery, antepartum condition or complication 02/08/2015  . Atrial septal defect 01/13/2015  . Cramping affecting pregnancy, antepartum 12/08/2014  . Labor and delivery, indication for care 11/12/2014  . Obesity affecting pregnancy 10/28/2014  . History of cardiomegaly 10/28/2014  . First trimester screening     Past Surgical History:  Procedure Laterality Date  . CESAREAN SECTION    . CESAREAN SECTION N/A 02/22/2015   Procedure: CESAREAN SECTION;  Surgeon: Nadara Mustardobert P Harris, MD;  Location: [facility] ORS;  Service: Obstetrics;  Laterality: N/A;    Allergies Patient has no known allergies.  Social History Social History  Substance Use Topics  . Smoking status: Current Every Day Smoker    Packs/day: 0.50    Types: Cigarettes    Last attempt to quit: 05/23/2014  . Smokeless tobacco: Never Used  . Alcohol use No    Review of Systems Constitutional: Negative for fever. Cardiovascular:  Negative for chest pain. Respiratory: Negative for shortness of breath. Gastrointestinal: Positive for abdominal pain Genitourinary: Negative for dysuria. Musculoskeletal: Negative for back pain. Skin: Negative for rash. Neurological: Negative for headaches, focal weakness or numbness.  10-point ROS otherwise negative.  ____________________________________________   PHYSICAL EXAM:  VITAL SIGNS: ED Triage Vitals  Enc Vitals Group     BP 02/13/16 0751 (!) 147/81     Pulse Rate 02/13/16 0751 97     Resp 02/13/16 0751 18     Temp 02/13/16 0751 97.7 F (36.5 C)     Temp Source 02/13/16 0751 Oral     SpO2 02/13/16 0751 100 %     Weight 02/13/16 0752 251 lb (113.9 kg)     Height 02/13/16 0752 5\' 7"  (1.702 m)     Head Circumference --      Peak Flow --      Pain Score 02/13/16 0752 9     Pain Loc --      Pain Edu? --      Excl. in GC? --     Constitutional: Alert and oriented. Well appearing and in no distress. Eyes: Conjunctivae are normal. PERRL. Normal extraocular movements. ENT   Head: Normocephalic and atraumatic.   Nose: No congestion/rhinnorhea.   Mouth/Throat: Mucous membranes are moist.   Neck: No stridor. Cardiovascular: Normal rate, regular rhythm. No murmurs, rubs, or gallops. Respiratory: Normal respiratory effort without tachypnea nor retractions. Breath sounds are clear and equal bilaterally. No wheezes/rales/rhonchi. Gastrointestinal: Tenderness along the lateral aspect of the right low transverse  C-section incision. Overlying skin is normal Genitourinary: Cervical motion tenderness, right adnexal tenderness, mild tan discharge Musculoskeletal: Nontender with normal range of motion in all extremities. No lower extremity tenderness nor edema. Neurologic:  Normal speech and language. No gross focal neurologic deficits are appreciated.  Skin:  Skin is warm, dry and intact. No rash noted. Psychiatric: Mood and affect are normal. Speech and behavior are  normal.  ____________________________________________  ED COURSE:  Pertinent labs & imaging results that were available during my care of the patient were reviewed by me and considered in my medical decision making (see chart for details). Clinical Course   Patients in no distress, we will assess with labs and imaging if needed.  Procedures ____________________________________________   LABS (pertinent positives/negatives)  Labs Reviewed  WET PREP, GENITAL - Abnormal; Notable for the following:       Result Value   WBC, Wet Prep HPF POC MODERATE (*)    All other components within normal limits  CBC WITH DIFFERENTIAL/PLATELET - Abnormal; Notable for the following:    MCV 78.8 (*)    RDW 15.1 (*)    All other components within normal limits  COMPREHENSIVE METABOLIC PANEL - Abnormal; Notable for the following:    Potassium 3.3 (*)    Calcium 8.7 (*)    Albumin 3.4 (*)    All other components within normal limits  URINALYSIS COMPLETEWITH MICROSCOPIC ([facility] ONLY) - Abnormal; Notable for the following:    Color, Urine YELLOW (*)    APPearance CLEAR (*)    Squamous Epithelial / LPF 0-5 (*)    All other components within normal limits  CHLAMYDIA/NGC RT PCR ([facility] ONLY)  POC URINE PREG, ED  POCT PREGNANCY, URINE    RADIOLOGY Images were viewed by me  Pelvic ultrasound IMPRESSION: 4.7 cm complex right ovarian cyst with a single thin septation, likely benign. Follow-up pelvic ultrasound is suggested in 6-12 weeks.  Otherwise negative pelvic ultrasound.  No evidence of ovarian torsion. ____________________________________________  FINAL ASSESSMENT AND PLAN  Pelvic pain, Ovarian cyst  Plan: Patient with labs and imaging as dictated above. Patient is in no distress, pain is likely from 4.7 cm complex right ovarian cyst. She'll be discharged with pain medicine and be referred to OB/GYN for close outpatient follow-up.   Emily FilbertWilliams, Jonathan E, MD   Note: This  dictation was prepared with Dragon dictation. Any transcriptional errors that result from this process are unintentional    Emily FilbertJonathan E Williams, MD 02/13/16 1041

## 2016-02-23 ENCOUNTER — Emergency Department
Admission: EM | Admit: 2016-02-23 | Discharge: 2016-02-23 | Disposition: A | Discharge status: Home / Self Care | Payer: Medicaid Other | Attending: Emergency Medicine | Admitting: Emergency Medicine

## 2016-02-23 ENCOUNTER — Encounter: Payer: Self-pay | Admitting: Emergency Medicine

## 2016-02-23 DIAGNOSIS — J039 Acute tonsillitis, unspecified: Secondary | ICD-10-CM | POA: Insufficient documentation

## 2016-02-23 DIAGNOSIS — Z791 Long term (current) use of non-steroidal anti-inflammatories (NSAID): Secondary | ICD-10-CM | POA: Insufficient documentation

## 2016-02-23 DIAGNOSIS — F17210 Nicotine dependence, cigarettes, uncomplicated: Secondary | ICD-10-CM | POA: Insufficient documentation

## 2016-02-23 DIAGNOSIS — Z7982 Long term (current) use of aspirin: Secondary | ICD-10-CM | POA: Insufficient documentation

## 2016-02-23 LAB — POCT RAPID STREP A: STREPTOCOCCUS, GROUP A SCREEN (DIRECT): POSITIVE — AB

## 2016-02-23 MED ORDER — IBUPROFEN 800 MG PO TABS
800.0000 mg | ORAL_TABLET | Freq: Once | ORAL | Status: AC
  Administered 2016-02-23: 800 mg via ORAL
  Filled 2016-02-23: qty 1

## 2016-02-23 MED ORDER — IBUPROFEN 800 MG PO TABS: 800.0000 mg | ORAL_TABLET | Freq: Three times a day (TID) | ORAL | 0 refills | Status: DC | PRN

## 2016-02-23 MED ORDER — DIPHENHYDRAMINE HCL 12.5 MG/5ML PO ELIX
25.0000 mg | ORAL_SOLUTION | Freq: Once | ORAL | Status: AC
  Administered 2016-02-23: 25 mg via ORAL
  Filled 2016-02-23: qty 10

## 2016-02-23 MED ORDER — MAGIC MOUTHWASH W/LIDOCAINE: 5.0000 mL | Freq: Four times a day (QID) | ORAL | 0 refills | Status: DC

## 2016-02-23 MED ORDER — LIDOCAINE VISCOUS 2 % MT SOLN
15.0000 mL | Freq: Once | OROMUCOSAL | Status: AC
  Administered 2016-02-23: 15 mL via OROMUCOSAL
  Filled 2016-02-23: qty 15

## 2016-02-23 MED ORDER — AMOXICILLIN 500 MG PO CAPS: 500.0000 mg | ORAL_CAPSULE | Freq: Three times a day (TID) | ORAL | 0 refills | Status: DC

## 2016-02-23 NOTE — ED Triage Notes (Addendum)
Patient presents to the ED with sore throat, body aches, vomiting x 1 that occurred after coughing and report of intermittent fever.  Patient states they took her temp at work yesterday and it was 101.3.  Patient denies diarrhea.  Patient ambulatory to triage without difficulty.  No obvious distress at this time.

## 2016-02-23 NOTE — ED Provider Notes (Signed)
[facility] Emergency Department Provider Note   ____________________________________________   None    (approximate)  I have reviewed the triage vital signs and the nursing notes.   HISTORY  Chief Complaint Cough and Sore Throat    HPI Ashley Howell is a 25 y.o. female patient complaining of sore throat, body aches, and coughing. Patient state coughing and one episode of vomiting. Patient denies nausea or diarrhea. Patient state he can tolerate foods and fluids without discomfort. Patient states she had a fever today of 101.3 yesterday at work. Patient is afebrile upon arrival to emergency room. No palliative measures taken for this complaint. Patient rates the pain as a 7/10. Patient describes pain as "sore".  Past Medical History:  Diagnosis Date  . Anxiety   . GERD (gastroesophageal reflux disease)   . Heart defect   . Shortness of breath dyspnea     Patient Active Problem List   Diagnosis Date Noted  . S/P cesarean section 02/22/2015  . Obesity in pregnancy, antepartum 02/15/2015  . Previous cesarean delivery, antepartum condition or complication 02/08/2015  . Atrial septal defect 01/13/2015  . Cramping affecting pregnancy, antepartum 12/08/2014  . Labor and delivery, indication for care 11/12/2014  . Obesity affecting pregnancy 10/28/2014  . History of cardiomegaly 10/28/2014  . First trimester screening     Past Surgical History:  Procedure Laterality Date  . CESAREAN SECTION    . CESAREAN SECTION N/A 02/22/2015   Procedure: CESAREAN SECTION;  Surgeon: Nadara Mustardobert P Harris, MD;  Location: [facility] ORS;  Service: Obstetrics;  Laterality: N/A;    Prior to Admission medications   Medication Sig Start Date End Date Taking? Authorizing Provider  amoxicillin (AMOXIL) 500 MG capsule Take 1 capsule (500 mg total) by mouth 3 (three) times daily. 02/23/16   Joni Reiningonald K Smith, PA-C  aspirin 81 MG chewable tablet Chew 81 mg by mouth daily.    Historical  Provider, MD  ibuprofen (ADVIL,MOTRIN) 600 MG tablet Take 1 tablet (600 mg total) by mouth every 8 (eight) hours as needed. 02/13/16   Emily FilbertJonathan E Williams, MD  ibuprofen (ADVIL,MOTRIN) 800 MG tablet Take 1 tablet (800 mg total) by mouth every 8 (eight) hours as needed for moderate pain. 02/23/16   Joni Reiningonald K Smith, PA-C  magic mouthwash w/lidocaine SOLN Take 5 mLs by mouth 4 (four) times daily. 02/23/16   Joni Reiningonald K Smith, PA-C  oxyCODONE-acetaminophen (PERCOCET) 5-325 MG tablet Take 2 tablets by mouth every 6 (six) hours as needed for moderate pain or severe pain. 02/13/16   Emily FilbertJonathan E Williams, MD    Allergies Patient has no known allergies.  No family history on file.  Social History Social History  Substance Use Topics  . Smoking status: Current Every Day Smoker    Packs/day: 0.50    Types: Cigarettes    Last attempt to quit: 05/23/2014  . Smokeless tobacco: Never Used  . Alcohol use No    Review of Systems Constitutional: Fever and chills Eyes: No visual changes. ENT: Sore throat. Cardiovascular: Denies chest pain. Respiratory: Denies shortness of breath. Gastrointestinal: No abdominal pain.  No nausea, no vomiting.  No diarrhea.  No constipation. Genitourinary: Negative for dysuria. Musculoskeletal: Negative for back pain. Skin: Negative for rash. Neurological: Negative for headaches, focal weakness or numbness.    ____________________________________________   PHYSICAL EXAM:  VITAL SIGNS: ED Triage Vitals  Enc Vitals Group     BP 02/23/16 1221 136/74     Pulse Rate 02/23/16 1221 (!) 103  Resp 02/23/16 1221 16     Temp 02/23/16 1221 97.9 F (36.6 C)     Temp Source 02/23/16 1221 Oral     SpO2 02/23/16 1221 98 %     Weight 02/23/16 1222 250 lb (113.4 kg)     Height 02/23/16 1222 5\' 6"  (1.676 m)     Head Circumference --      Peak Flow --      Pain Score 02/23/16 1221 7     Pain Loc --      Pain Edu? --      Excl. in GC? --     Constitutional: Alert and  oriented. Well appearing and in no acute distress. Eyes: Conjunctivae are normal. PERRL. EOMI. Head: Atraumatic. Nose: No congestion/rhinnorhea. Mouth/Throat: Mucous membranes are moist.  Oropharynx non-erythematous. Edematous bilateral tonsillar exudates right tonsil. Neck: No stridor.  No cervical spine tenderness to palpation. Hematological/Lymphatic/Immunilogical: Bilateral cervical lymphadenopathy. Cardiovascular: Normal rate, regular rhythm. Grossly normal heart sounds.  Good peripheral circulation. Respiratory: Normal respiratory effort.  No retractions. Lungs CTAB. Gastrointestinal: Soft and nontender. No distention. No abdominal bruits. No CVA tenderness. Musculoskeletal: No lower extremity tenderness nor edema.  No joint effusions. Neurologic:  Normal speech and language. No gross focal neurologic deficits are appreciated. No gait instability. Skin:  Skin is warm, dry and intact. No rash noted. Psychiatric: Mood and affect are normal. Speech and behavior are normal.  ____________________________________________   LABS (all labs ordered are listed, but only abnormal results are displayed)  Labs Reviewed - No data to display ____________________________________________  EKG   ____________________________________________  RADIOLOGY   ____________________________________________   PROCEDURES  Procedure(s) performed: None  Procedures  Critical Care performed: No  ____________________________________________   INITIAL IMPRESSION / ASSESSMENT AND PLAN / ED COURSE  Pertinent labs & imaging results that were available during my care of the patient were reviewed by me and considered in my medical decision making (see chart for details).  Exudative tonsils. Patient given discharge care instructions. Patient given a prescription for amoxicillin, Magic mouthwash, and ibuprofen. Patient advised to follow-up with family doctor this condition persists.  Clinical Course        ____________________________________________   FINAL CLINICAL IMPRESSION(S) / ED DIAGNOSES  Final diagnoses:  Tonsillitis with exudate      NEW MEDICATIONS STARTED DURING THIS VISIT:  New Prescriptions   AMOXICILLIN (AMOXIL) 500 MG CAPSULE    Take 1 capsule (500 mg total) by mouth 3 (three) times daily.   IBUPROFEN (ADVIL,MOTRIN) 800 MG TABLET    Take 1 tablet (800 mg total) by mouth every 8 (eight) hours as needed for moderate pain.   MAGIC MOUTHWASH W/LIDOCAINE SOLN    Take 5 mLs by mouth 4 (four) times daily.     Note:  This document was prepared using Dragon voice recognition software and may include unintentional dictation errors.    Joni Reiningonald K Smith, PA-C 02/23/16 1254    Emily FilbertJonathan E Williams, MD 02/23/16 [phone]

## 2016-05-15 ENCOUNTER — Emergency Department: Payer: Medicaid Other

## 2016-05-15 ENCOUNTER — Emergency Department
Admission: EM | Admit: 2016-05-15 | Discharge: 2016-05-15 | Disposition: A | Discharge status: Home / Self Care | Payer: Medicaid Other | Attending: Student in an Organized Health Care Education/Training Program | Admitting: Student in an Organized Health Care Education/Training Program

## 2016-05-15 ENCOUNTER — Encounter: Payer: Self-pay | Admitting: Emergency Medicine

## 2016-05-15 DIAGNOSIS — Z7982 Long term (current) use of aspirin: Secondary | ICD-10-CM | POA: Insufficient documentation

## 2016-05-15 DIAGNOSIS — J40 Bronchitis, not specified as acute or chronic: Secondary | ICD-10-CM

## 2016-05-15 DIAGNOSIS — F17210 Nicotine dependence, cigarettes, uncomplicated: Secondary | ICD-10-CM | POA: Insufficient documentation

## 2016-05-15 DIAGNOSIS — R079 Chest pain, unspecified: Secondary | ICD-10-CM

## 2016-05-15 LAB — CBC
HEMATOCRIT: 40.3 % (ref 35.0–47.0)
HEMOGLOBIN: 13.8 g/dL (ref 12.0–16.0)
MCH: 27.3 pg (ref 26.0–34.0)
MCHC: 34.3 g/dL (ref 32.0–36.0)
MCV: 79.7 fL — ABNORMAL LOW (ref 80.0–100.0)
Platelets: 280 10*3/uL (ref 150–440)
RBC: 5.06 MIL/uL (ref 3.80–5.20)
RDW: 14.7 % — ABNORMAL HIGH (ref 11.5–14.5)
WBC: 7 10*3/uL (ref 3.6–11.0)

## 2016-05-15 LAB — BASIC METABOLIC PANEL
ANION GAP: 7 (ref 5–15)
BUN: 15 mg/dL (ref 6–20)
CALCIUM: 9 mg/dL (ref 8.9–10.3)
CO2: 24 mmol/L (ref 22–32)
Chloride: 106 mmol/L (ref 101–111)
Creatinine, Ser: 0.85 mg/dL (ref 0.44–1.00)
GLUCOSE: 83 mg/dL (ref 65–99)
Potassium: 3.8 mmol/L (ref 3.5–5.1)
Sodium: 137 mmol/L (ref 135–145)

## 2016-05-15 LAB — TROPONIN I

## 2016-05-15 MED ORDER — ALBUTEROL SULFATE HFA 108 (90 BASE) MCG/ACT IN AERS: 2.0000 | INHALATION_SPRAY | Freq: Four times a day (QID) | RESPIRATORY_TRACT | 2 refills | Status: DC | PRN

## 2016-05-15 MED ORDER — HYDROCOD POLST-CPM POLST ER 10-8 MG/5ML PO SUER: 5.0000 mL | Freq: Every evening | ORAL | 0 refills | Status: DC | PRN

## 2016-05-15 MED ORDER — DEXAMETHASONE 4 MG PO TABS
10.0000 mg | ORAL_TABLET | Freq: Once | ORAL | Status: AC
  Administered 2016-05-15: 10 mg via ORAL
  Filled 2016-05-15: qty 2.5

## 2016-05-15 NOTE — ED Notes (Signed)
Family brought to bedside.  Dr. Roxan Hockeyobinson in with patient.

## 2016-05-15 NOTE — ED Provider Notes (Signed)
[facility] Emergency Department Provider Note    First MD Initiated Contact with Patient 05/15/16 1005     (approximate)  I have reviewed the triage vital signs and the nursing notes.   HISTORY  Chief Complaint Chest Pain    HPI Ashley Howell is a 26 y.o. female this with several days of runny nose sore throat as well as central chest pain associated with coughing. States she has been having chills but no fevers. States that her daughter had similar upper respiratory infection symptoms starting last week. Denies any nausea or vomiting. It is a nonproductive cough. Patient smokes several cigarettes a day. No history of asthma. Denies any palpitations. No pain radiating to her back. No unilateral leg swelling.   Past Medical History:  Diagnosis Date  . Anxiety   . GERD (gastroesophageal reflux disease)   . Heart defect   . Shortness of breath dyspnea    History reviewed. No pertinent family history. Past Surgical History:  Procedure Laterality Date  . CESAREAN SECTION    . CESAREAN SECTION N/A 02/22/2015   Procedure: CESAREAN SECTION;  Surgeon: Nadara Mustardobert P Harris, MD;  Location: [facility] ORS;  Service: Obstetrics;  Laterality: N/A;   Patient Active Problem List   Diagnosis Date Noted  . S/P cesarean section 02/22/2015  . Obesity in pregnancy, antepartum 02/15/2015  . Previous cesarean delivery, antepartum condition or complication 02/08/2015  . Atrial septal defect 01/13/2015  . Cramping affecting pregnancy, antepartum 12/08/2014  . Labor and delivery, indication for care 11/12/2014  . Obesity affecting pregnancy 10/28/2014  . History of cardiomegaly 10/28/2014  . First trimester screening       Prior to Admission medications   Medication Sig Start Date End Date Taking? Authorizing Provider  amoxicillin (AMOXIL) 500 MG capsule Take 1 capsule (500 mg total) by mouth 3 (three) times daily. 02/23/16   Joni Reiningonald K Smith, PA-C  aspirin 81 MG  chewable tablet Chew 81 mg by mouth daily.    Historical Provider, MD  ibuprofen (ADVIL,MOTRIN) 600 MG tablet Take 1 tablet (600 mg total) by mouth every 8 (eight) hours as needed. 02/13/16   Emily FilbertJonathan E Williams, MD  ibuprofen (ADVIL,MOTRIN) 800 MG tablet Take 1 tablet (800 mg total) by mouth every 8 (eight) hours as needed for moderate pain. 02/23/16   Joni Reiningonald K Smith, PA-C  magic mouthwash w/lidocaine SOLN Take 5 mLs by mouth 4 (four) times daily. 02/23/16   Joni Reiningonald K Smith, PA-C  oxyCODONE-acetaminophen (PERCOCET) 5-325 MG tablet Take 2 tablets by mouth every 6 (six) hours as needed for moderate pain or severe pain. 02/13/16   Emily FilbertJonathan E Williams, MD    Allergies Patient has no known allergies.    Social History Social History  Substance Use Topics  . Smoking status: Current Every Day Smoker    Packs/day: 0.50    Types: Cigarettes    Last attempt to quit: 05/23/2014  . Smokeless tobacco: Never Used  . Alcohol use No    Review of Systems Patient denies headaches, rhinorrhea, blurry vision, numbness, shortness of breath, chest pain, edema, cough, abdominal pain, nausea, vomiting, diarrhea, dysuria, fevers, rashes or hallucinations unless otherwise stated above in HPI. ____________________________________________   PHYSICAL EXAM:  VITAL SIGNS: Vitals:   05/15/16 0903  BP: (!) 143/87  Pulse: 92  Resp: 18  Temp: 98.2 F (36.8 C)    Constitutional: Alert and oriented. Well appearing and in no acute distress. Eyes: Conjunctivae are normal. PERRL. EOMI. Head: Atraumatic. Nose:  No congestion/rhinnorhea. Mouth/Throat: Mucous membranes are moist.  Oropharynx non-erythematous. Neck: No stridor. Painless ROM. No cervical spine tenderness to palpation Hematological/Lymphatic/Immunilogical: No cervical lymphadenopathy. Cardiovascular: Normal rate, regular rhythm. Grossly normal heart sounds.  Good peripheral circulation. Respiratory: Normal respiratory effort.  No retractions. Lungs  with coarse breathsounds throughout Gastrointestinal: Soft and nontender. No distention. No abdominal bruits. No CVA tenderness. Musculoskeletal: No lower extremity tenderness nor edema.  No joint effusions. Neurologic:  Normal speech and language. No gross focal neurologic deficits are appreciated. No gait instability. Skin:  Skin is warm, dry and intact. No rash noted. Psychiatric: Mood and affect are normal. Speech and behavior are normal.  ____________________________________________   LABS (all labs ordered are listed, but only abnormal results are displayed)  Results for orders placed or performed during the hospital encounter of 05/15/16 (from the past 24 hour(s))  Basic metabolic panel     Status: None   Collection Time: 05/15/16  9:02 AM  Result Value Ref Range   Sodium 137 135 - 145 mmol/L   Potassium 3.8 3.5 - 5.1 mmol/L   Chloride 106 101 - 111 mmol/L   CO2 24 22 - 32 mmol/L   Glucose, Bld 83 65 - 99 mg/dL   BUN 15 6 - 20 mg/dL   Creatinine, Ser 4.09 0.44 - 1.00 mg/dL   Calcium 9.0 8.9 - 81.1 mg/dL   GFR calc non Af Amer >60 >60 mL/min   GFR calc Af Amer >60 >60 mL/min   Anion gap 7 5 - 15  CBC     Status: Abnormal   Collection Time: 05/15/16  9:02 AM  Result Value Ref Range   WBC 7.0 3.6 - 11.0 K/uL   RBC 5.06 3.80 - 5.20 MIL/uL   Hemoglobin 13.8 12.0 - 16.0 g/dL   HCT 91.4 78.2 - 95.6 %   MCV 79.7 (L) 80.0 - 100.0 fL   MCH 27.3 26.0 - 34.0 pg   MCHC 34.3 32.0 - 36.0 g/dL   RDW 21.3 (H) 08.6 - 57.8 %   Platelets 280 150 - 440 K/uL  Troponin I     Status: None   Collection Time: 05/15/16  9:02 AM  Result Value Ref Range   Troponin I <0.03 <0.03 ng/mL   ____________________________________________  EKG My review and personal interpretation at Time: 9:04   Indication: chest pain  Rate: 80  Rhythm: sinus Axis: normal Other: non specific t wave changes, no STEMI ____________________________________________  RADIOLOGY  I personally reviewed all  radiographic images ordered to evaluate for the above acute complaints and reviewed radiology reports and findings.  These findings were personally discussed with the patient.  Please see medical record for radiology report. ____________________________________________   PROCEDURES  Procedure(s) performed:  Procedures    Critical Care performed: no ____________________________________________   INITIAL IMPRESSION / ASSESSMENT AND PLAN / ED COURSE  Pertinent labs & imaging results that were available during my care of the patient were reviewed by me and considered in my medical decision making (see chart for details).  DDX: ACS, pericarditis, esophagitis, boerhaaves, pe, dissection, pna, bronchitis, costochondritis   KHAILA VELARDE is a 26 y.o. who presents to the ED with chest pain and cough and URI symptoms as described above. Patient hemodynamic stable and in no acute distress. EKG without evidence of acute ischemia or preexcitation syndrome. Troponin is negative. Do not feel is clinically consistent with ACS. Patient is low risk Wells and PERC negative. No evidence of pneumonia. Blood work is reassuring.  Presentation most consistent with bronchitis. We'll provide symptomatically treatment.  Have discussed with the patient and available family all diagnostics and treatments performed thus far and all questions were answered to the best of my ability. The patient demonstrates understanding and agreement with plan.       ____________________________________________   FINAL CLINICAL IMPRESSION(S) / ED DIAGNOSES  Final diagnoses:  Bronchitis  Chest pain, unspecified type      NEW MEDICATIONS STARTED DURING THIS VISIT:  New Prescriptions   No medications on file     Note:  This document was prepared using Dragon voice recognition software and may include unintentional dictation errors.    Willy Eddy, MD 05/15/16 1020

## 2016-05-15 NOTE — ED Triage Notes (Signed)
Pt c/o central chest pain that has been intermittent since Friday. Reports she put it off thinking it was because she had had a cough but pain getting worse and hurting when not coughing. Has had SHOB. No distress noted.

## 2016-06-06 IMAGING — US US OB COMP LESS 14 WK
1 series · 14 of 28 positions shown · non-contrast
Comparison: None.

CLINICAL DATA: Lower abdominal pain 2-3 days

EXAM:
OBSTETRIC <14 WK ULTRASOUND
TECHNIQUE: Transabdominal ultrasound was performed for evaluation of the
gestation as well as the maternal uterus and adnexal regions.

[Series 1: us ob comp less 14 wk · 0.22mm/px · 14 of 71 slices shown]
[im 3/71]
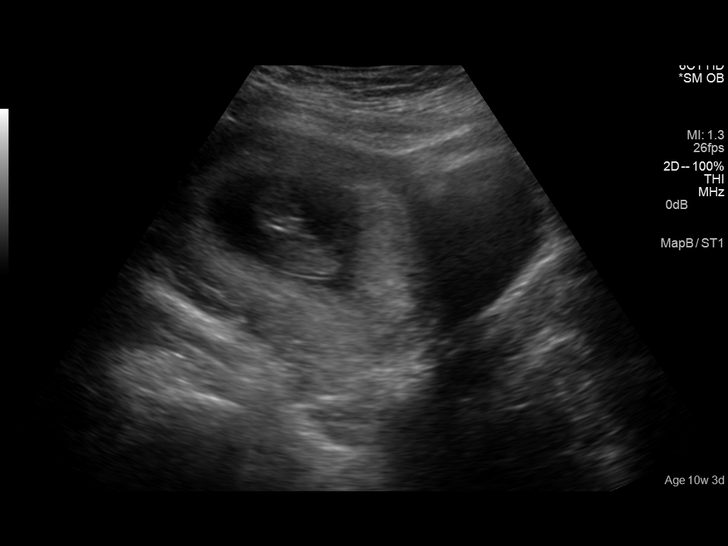
[im 8/71]
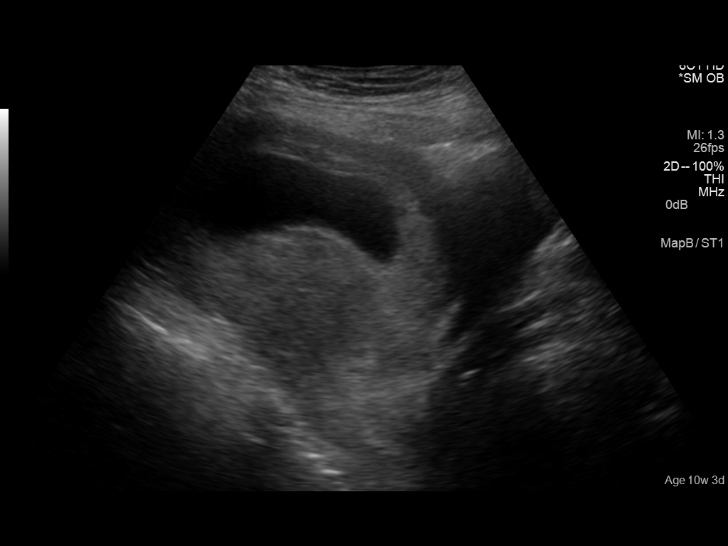
[im 13/71]
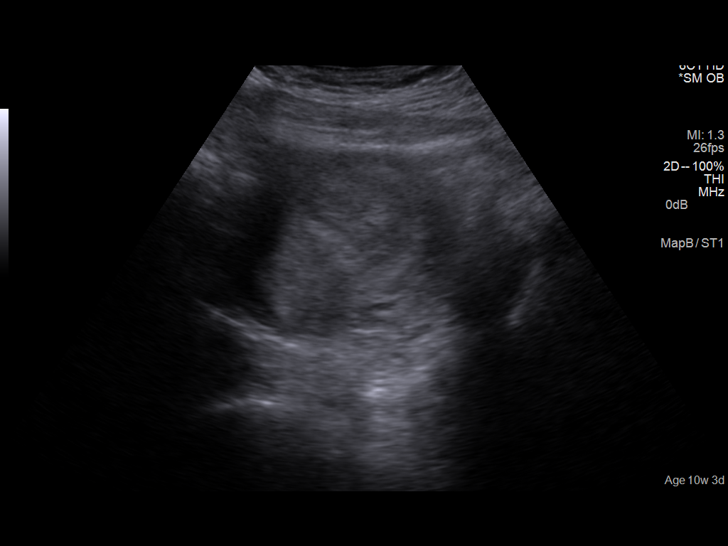
[im 19/71]
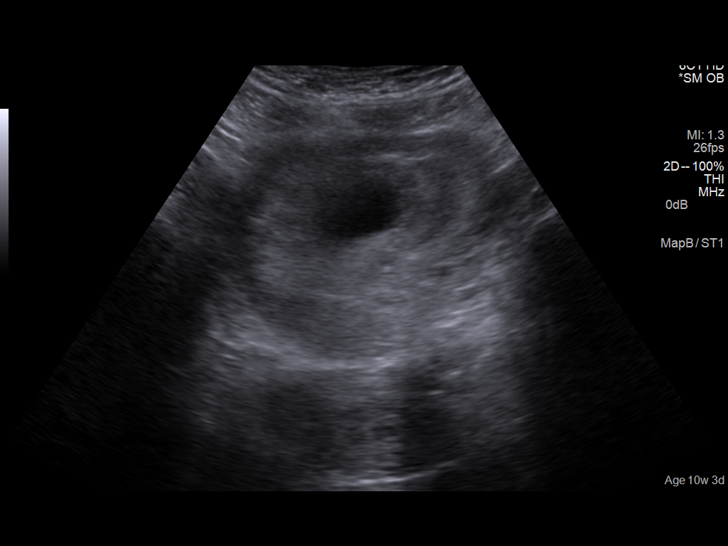
[im 24/71]
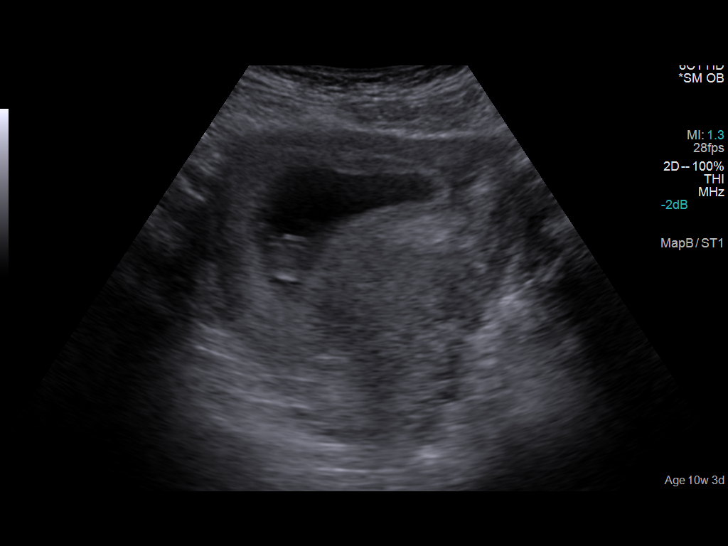
[im 29/71]
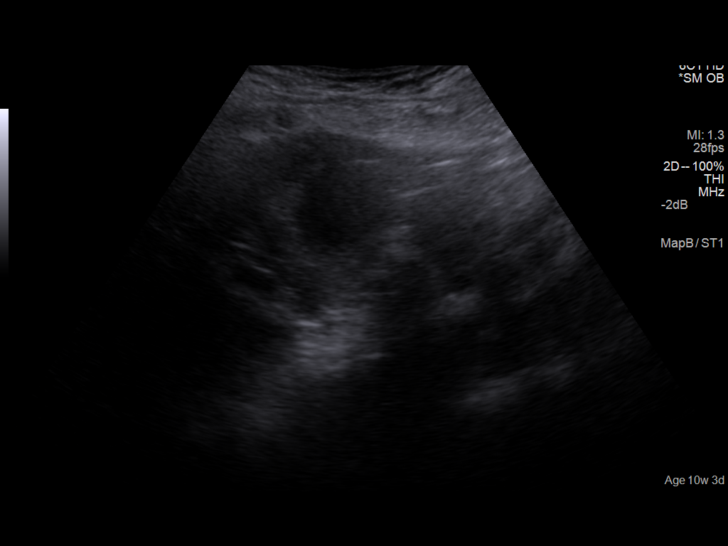
[im 34/71]
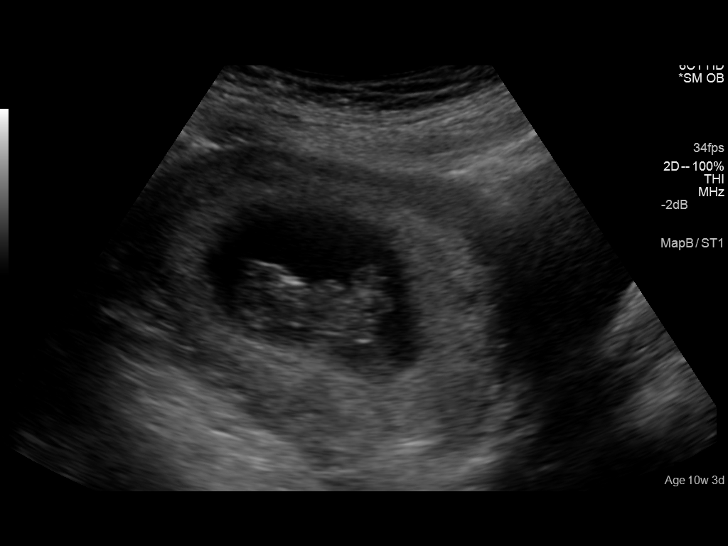
[im 39/71]
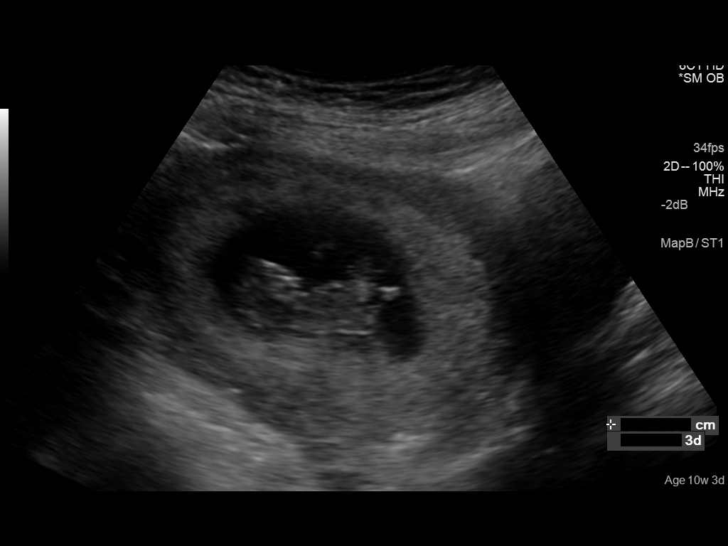
[im 45/71]
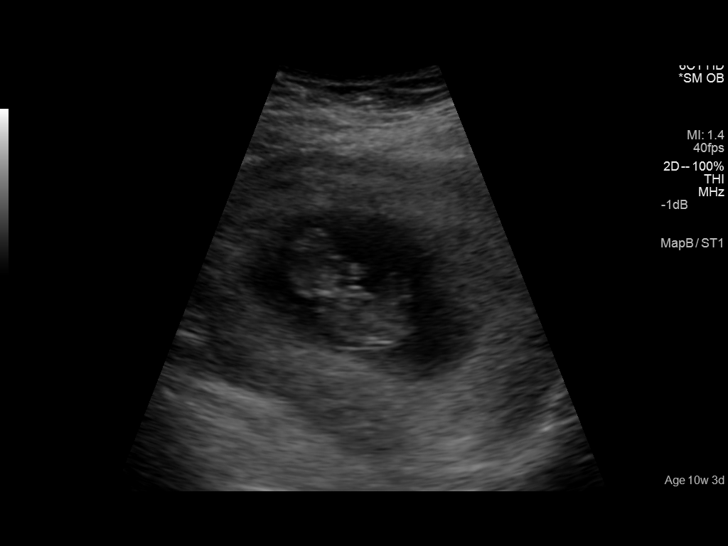
[im 50/71]
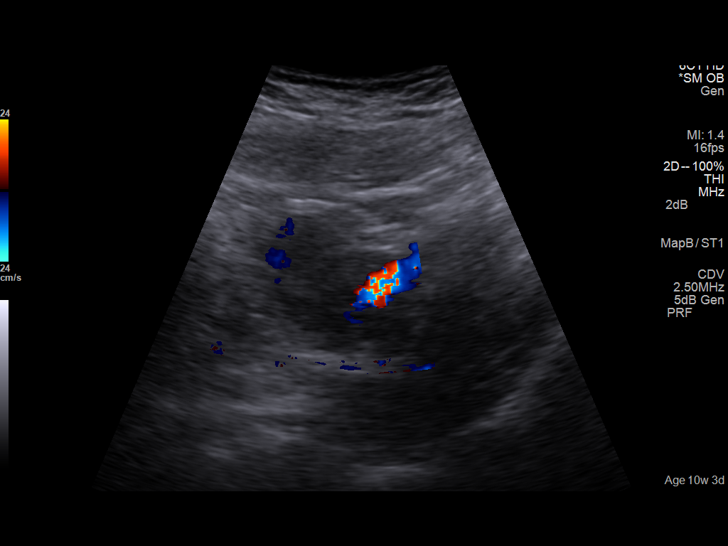
[im 55/71]
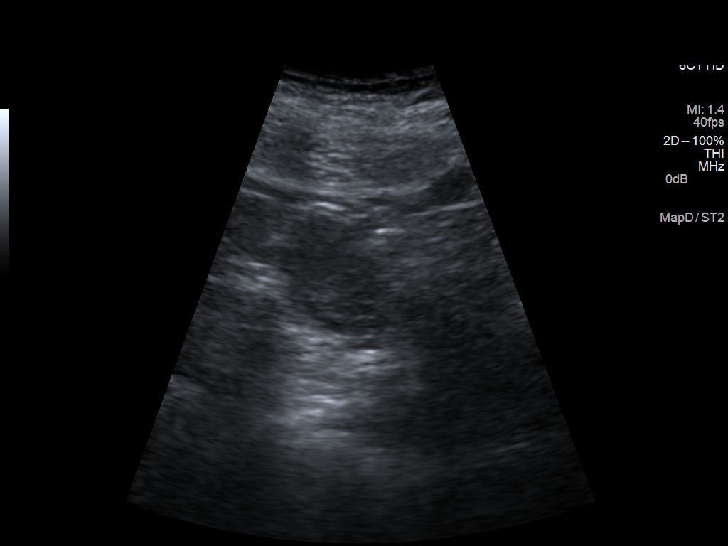
[im 60/71]
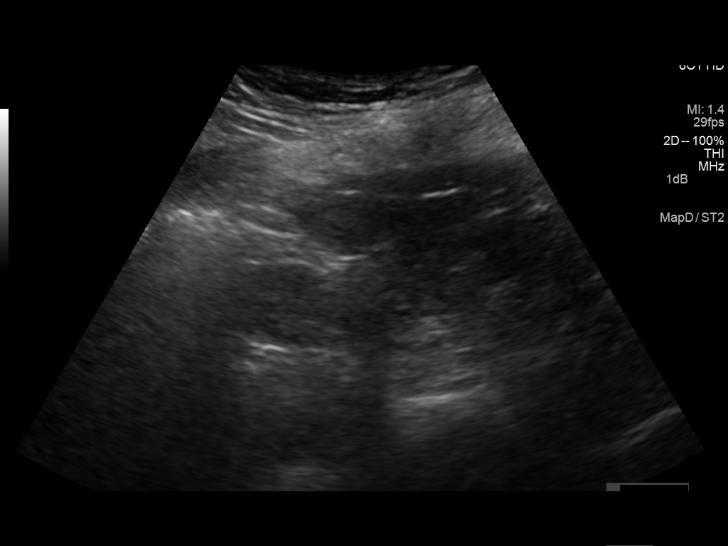
[im 65/71]
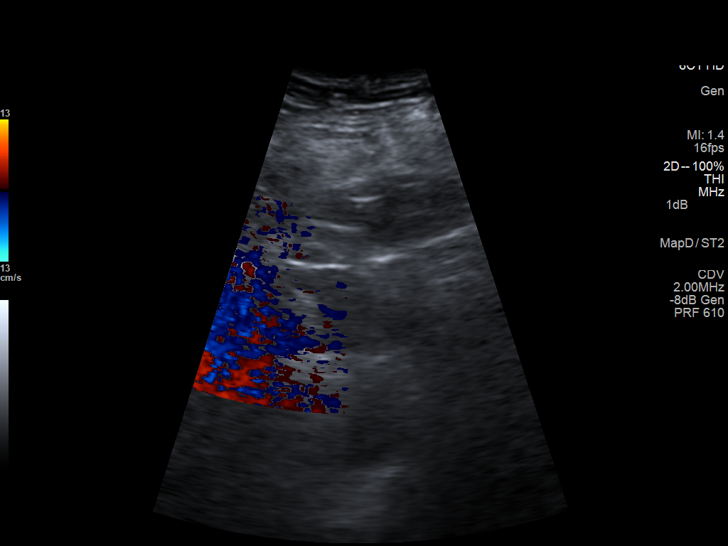
[im 71/71]
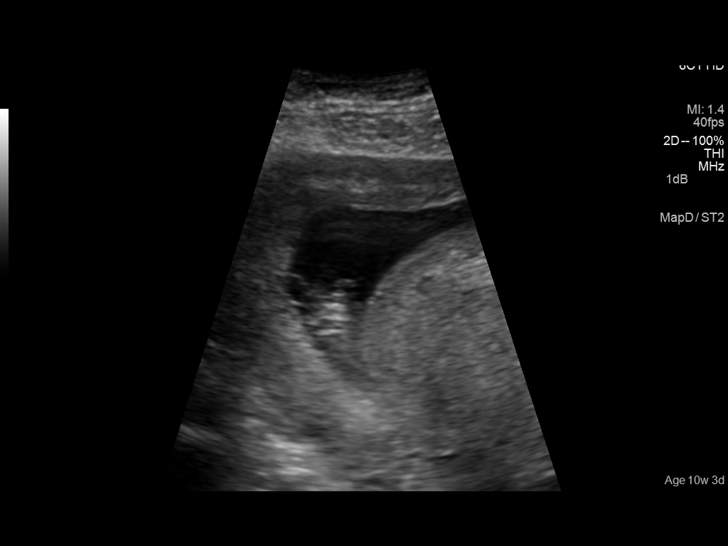

[14 of 28 positions shown; findings below may reference images not displayed]

FINDINGS: Intrauterine gestational sac: Visualized/normal in shape.

Yolk sac:  Present

Embryo:  Present

Cardiac Activity: Present

Heart Rate: 152 bpm

CRL:   35  mm   10 w 3 d                  US EDC: 03/07/2015

Maternal uterus/adnexae: No subchorionic hemorrhage. No adnexal
mass. No pelvic free fluid.
IMPRESSION: Single live intrauterine pregnancy dating 10 weeks 3 days.

## 2016-06-18 ENCOUNTER — Emergency Department
Admission: EM | Admit: 2016-06-18 | Discharge: 2016-06-18 | Disposition: A | Discharge status: Home / Self Care | Payer: Medicaid Other | Attending: Emergency Medicine | Admitting: Emergency Medicine

## 2016-06-18 ENCOUNTER — Encounter: Payer: Self-pay | Admitting: Emergency Medicine

## 2016-06-18 DIAGNOSIS — F17210 Nicotine dependence, cigarettes, uncomplicated: Secondary | ICD-10-CM | POA: Insufficient documentation

## 2016-06-18 DIAGNOSIS — Z7982 Long term (current) use of aspirin: Secondary | ICD-10-CM | POA: Insufficient documentation

## 2016-06-18 DIAGNOSIS — J020 Streptococcal pharyngitis: Secondary | ICD-10-CM

## 2016-06-18 LAB — POCT RAPID STREP A: STREPTOCOCCUS, GROUP A SCREEN (DIRECT): NEGATIVE

## 2016-06-18 MED ORDER — AMOXICILLIN 875 MG PO TABS: 875.0000 mg | ORAL_TABLET | Freq: Two times a day (BID) | ORAL | 0 refills | Status: DC

## 2016-06-18 MED ORDER — NYSTATIN 100000 UNIT/GM EX CREA: 1.0000 "application " | TOPICAL_CREAM | Freq: Two times a day (BID) | CUTANEOUS | 0 refills | Status: DC

## 2016-06-18 MED ORDER — AMOXICILLIN 500 MG PO CAPS
1000.0000 mg | ORAL_CAPSULE | Freq: Once | ORAL | Status: AC
  Administered 2016-06-18: 1000 mg via ORAL
  Filled 2016-06-18: qty 2

## 2016-06-18 MED ORDER — MAGIC MOUTHWASH W/LIDOCAINE: 5.0000 mL | Freq: Four times a day (QID) | ORAL | 0 refills | Status: DC

## 2016-06-18 NOTE — ED Provider Notes (Signed)
[facility] Emergency Department Provider Note  ____________________________________________  Time seen: Approximately 8:26 PM  I have reviewed the triage vital signs and the nursing notes.   HISTORY  Chief Complaint Sore Throat    HPI WEDNESDAY Ashley Howell is a 26 y.o. female who presents emergency department complaining of sore throat, fevers, swollen lymph nodes. She reports that she has had 4 other strep throat infections in the past 12 months. Patient reports that symptoms are consistent with previous infection. Patient reports that often her strep infections are negative with a strep swab. He denies any headache, visual changes, neck stiffness, chest pain, shortness of breath, coughing, abdominal pain, nausea vomiting, diarrhea or constipation. Patient meets 4 out of 5 Centor criteria with only exception being age under 21.No medications prior to arrival.   Past Medical History:  Diagnosis Date  . Anxiety   . GERD (gastroesophageal reflux disease)   . Heart defect   . Shortness of breath dyspnea     Patient Active Problem List   Diagnosis Date Noted  . S/P cesarean section 02/22/2015  . Obesity in pregnancy, antepartum 02/15/2015  . Previous cesarean delivery, antepartum condition or complication 02/08/2015  . Atrial septal defect 01/13/2015  . Cramping affecting pregnancy, antepartum 12/08/2014  . Labor and delivery, indication for care 11/12/2014  . Obesity affecting pregnancy 10/28/2014  . History of cardiomegaly 10/28/2014  . First trimester screening     Past Surgical History:  Procedure Laterality Date  . CESAREAN SECTION    . CESAREAN SECTION N/A 02/22/2015   Procedure: CESAREAN SECTION;  Surgeon: Nadara Mustard, MD;  Location: [facility] ORS;  Service: Obstetrics;  Laterality: N/A;    Prior to Admission medications   Medication Sig Start Date End Date Taking? Authorizing Provider  albuterol (PROVENTIL HFA;VENTOLIN HFA) 108 (90 Base)  MCG/ACT inhaler Inhale 2 puffs into the lungs every 6 (six) hours as needed for wheezing or shortness of breath. 05/15/16   Willy Eddy, MD  amoxicillin (AMOXIL) 875 MG tablet Take 1 tablet (875 mg total) by mouth 2 (two) times daily. 06/18/16   Delorise Royals Deshaun Weisinger, PA-C  aspirin 81 MG chewable tablet Chew 81 mg by mouth daily.    Historical Provider, MD  chlorpheniramine-HYDROcodone (TUSSIONEX PENNKINETIC ER) 10-8 MG/5ML SUER Take 5 mLs by mouth at bedtime as needed for cough. 05/15/16   Willy Eddy, MD  ibuprofen (ADVIL,MOTRIN) 600 MG tablet Take 1 tablet (600 mg total) by mouth every 8 (eight) hours as needed. 02/13/16   Emily Filbert, MD  ibuprofen (ADVIL,MOTRIN) 800 MG tablet Take 1 tablet (800 mg total) by mouth every 8 (eight) hours as needed for moderate pain. 02/23/16   Joni Reining, PA-C  magic mouthwash w/lidocaine SOLN Take 5 mLs by mouth 4 (four) times daily. 06/18/16   Delorise Royals Onyinyechi Huante, PA-C  oxyCODONE-acetaminophen (PERCOCET) 5-325 MG tablet Take 2 tablets by mouth every 6 (six) hours as needed for moderate pain or severe pain. 02/13/16   Emily Filbert, MD    Allergies Patient has no known allergies.  No family history on file.  Social History Social History  Substance Use Topics  . Smoking status: Current Every Day Smoker    Packs/day: 0.50    Types: Cigarettes    Last attempt to quit: 05/23/2014  . Smokeless tobacco: Never Used  . Alcohol use No     Review of Systems  Constitutional: Positive fever/chills Eyes: No visual changes. No discharge ENT: Positive sore throat Cardiovascular: no  chest pain. Respiratory: no cough. No SOB. Gastrointestinal: No abdominal pain.  No nausea, no vomiting.  No diarrhea.  No constipation. Musculoskeletal: Negative for musculoskeletal pain. Skin: Negative for rash, abrasions, lacerations, ecchymosis. Neurological: Negative for headaches, focal weakness or numbness. 10-point ROS otherwise  negative.  ____________________________________________   PHYSICAL EXAM:  VITAL SIGNS: ED Triage Vitals [06/18/16 2017]  Enc Vitals Group     BP (!) 142/84     Pulse Rate 85     Resp 18     Temp 99.2 F (37.3 C)     Temp Source Oral     SpO2 98 %     Weight 250 lb (113.4 kg)     Height 5\' 6"  (1.676 m)     Head Circumference      Peak Flow      Pain Score 9     Pain Loc      Pain Edu?      Excl. in GC?      Constitutional: Alert and oriented. Well appearing and in no acute distress. Eyes: Conjunctivae are normal. PERRL. EOMI. Head: Atraumatic. ENT:      Ears:       Nose: No congestion/rhinnorhea.      Mouth/Throat: Mucous membranes are moist. Tonsils are erythematous bilaterally. Edematous bilaterally. White exudates noted bilaterally. Uvula is midline. Neck: No stridor. Neck is supple full range of motion Hematological/Lymphatic/Immunilogical: Diffuse, mobile, tender anterior cervical lymphadenopathy. Cardiovascular: Normal rate, regular rhythm. Normal S1 and S2.  Good peripheral circulation. Respiratory: Normal respiratory effort without tachypnea or retractions. Lungs CTAB. Good air entry to the bases with no decreased or absent breath sounds. Gastrointestinal: Bowel sounds 4 quadrants. Soft and nontender to palpation. No guarding or rigidity. No palpable masses. No distention.  Musculoskeletal: Full range of motion to all extremities. No gross deformities appreciated. Neurologic:  Normal speech and language. No gross focal neurologic deficits are appreciated.  Skin:  Skin is warm, dry and intact. No rash noted. Psychiatric: Mood and affect are normal. Speech and behavior are normal. Patient exhibits appropriate insight and judgement.   ____________________________________________   LABS (all labs ordered are listed, but only abnormal results are displayed)  Labs Reviewed  POCT RAPID STREP A    ____________________________________________  EKG   ____________________________________________  RADIOLOGY   No results found.  ____________________________________________    PROCEDURES  Procedure(s) performed:    Procedures    Medications  amoxicillin (AMOXIL) capsule 1,000 mg (not administered)     ____________________________________________   INITIAL IMPRESSION / ASSESSMENT AND PLAN / ED COURSE  Pertinent labs & imaging results that were available during my care of the patient were reviewed by me and considered in my medical decision making (see chart for details).  Review of the [place] CSRS was performed in accordance of the [facility] prior to dispensing any controlled drugs.     Patient's diagnosis is consistent with strep throat. Negative strep test in the emergency department. Patient is diagnosed based off of centor criteria. She has had 4 other strep infections in the last 12 months. Patient's symptoms are consistent with strep and she will be treated as such with antibiotics and symptom control medications. Patient will follow-up with ENT surgery to discuss tonsillectomy from repeated strep infections. Patient is given ED precautions to return to the ED for any worsening or new symptoms.     ____________________________________________  FINAL CLINICAL IMPRESSION(S) / ED DIAGNOSES  Final diagnoses:  Strep throat      NEW MEDICATIONS  STARTED DURING THIS VISIT:  New Prescriptions   AMOXICILLIN (AMOXIL) 875 MG TABLET    Take 1 tablet (875 mg total) by mouth 2 (two) times daily.   MAGIC MOUTHWASH W/LIDOCAINE SOLN    Take 5 mLs by mouth 4 (four) times daily.        This chart was dictated using voice recognition software/Dragon. Despite best efforts to proofread, errors can occur which can change the meaning. Any change was purely unintentional.    Racheal PatchesJonathan D Kerina Simoneau, PA-C 06/18/16 2045    Myrna Blazeravid Matthew Schaevitz, MD 06/18/16 [id number]

## 2016-06-18 NOTE — ED Notes (Signed)
(  POCT RAPID STREP A) results were **NEGATIVE**; Culture has been sent to the lab for further analysis

## 2016-06-18 NOTE — ED Triage Notes (Signed)
Patient states as of yesterday she has had sore throat with swollen glands. Reports fever last night, none today. Patient in no acute distress.

## 2016-08-24 ENCOUNTER — Emergency Department
Admission: EM | Admit: 2016-08-24 | Discharge: 2016-08-24 | Disposition: A | Discharge status: Home / Self Care | Payer: Medicaid Other | Attending: Emergency Medicine | Admitting: Emergency Medicine

## 2016-08-24 ENCOUNTER — Emergency Department
Admission: EM | Admit: 2016-08-24 | Discharge: 2016-08-24 | Disposition: A | Discharge status: Home / Self Care | Payer: Self-pay | Attending: Emergency Medicine | Admitting: Emergency Medicine

## 2016-08-24 ENCOUNTER — Encounter: Payer: Self-pay | Admitting: Emergency Medicine

## 2016-08-24 ENCOUNTER — Emergency Department: Payer: Self-pay

## 2016-08-24 ENCOUNTER — Telehealth: Payer: Self-pay | Admitting: Emergency Medicine

## 2016-08-24 DIAGNOSIS — Z5321 Procedure and treatment not carried out due to patient leaving prior to being seen by health care provider: Secondary | ICD-10-CM | POA: Insufficient documentation

## 2016-08-24 DIAGNOSIS — R079 Chest pain, unspecified: Secondary | ICD-10-CM

## 2016-08-24 DIAGNOSIS — Z7982 Long term (current) use of aspirin: Secondary | ICD-10-CM | POA: Insufficient documentation

## 2016-08-24 DIAGNOSIS — Q219 Congenital malformation of cardiac septum, unspecified: Secondary | ICD-10-CM | POA: Insufficient documentation

## 2016-08-24 DIAGNOSIS — Z791 Long term (current) use of non-steroidal anti-inflammatories (NSAID): Secondary | ICD-10-CM | POA: Insufficient documentation

## 2016-08-24 DIAGNOSIS — F17200 Nicotine dependence, unspecified, uncomplicated: Secondary | ICD-10-CM | POA: Insufficient documentation

## 2016-08-24 DIAGNOSIS — Z79899 Other long term (current) drug therapy: Secondary | ICD-10-CM | POA: Insufficient documentation

## 2016-08-24 LAB — CBC
HEMATOCRIT: 42.2 % (ref 35.0–47.0)
Hemoglobin: 14.2 g/dL (ref 12.0–16.0)
MCH: 27.5 pg (ref 26.0–34.0)
MCHC: 33.7 g/dL (ref 32.0–36.0)
MCV: 81.4 fL (ref 80.0–100.0)
Platelets: 280 10*3/uL (ref 150–440)
RBC: 5.18 MIL/uL (ref 3.80–5.20)
RDW: 14.2 % (ref 11.5–14.5)
WBC: 10.8 10*3/uL (ref 3.6–11.0)

## 2016-08-24 LAB — BASIC METABOLIC PANEL
Anion gap: 6 (ref 5–15)
BUN: 15 mg/dL (ref 6–20)
CHLORIDE: 108 mmol/L (ref 101–111)
CO2: 24 mmol/L (ref 22–32)
Calcium: 9.2 mg/dL (ref 8.9–10.3)
Creatinine, Ser: 0.7 mg/dL (ref 0.44–1.00)
GFR calc Af Amer: >60 mL/min (ref >60)
GFR calc non Af Amer: >60 mL/min (ref >60)
GLUCOSE: 96 mg/dL (ref 65–99)
POTASSIUM: 3.6 mmol/L (ref 3.5–5.1)
Sodium: 138 mmol/L (ref 135–145)

## 2016-08-24 LAB — TROPONIN I: Troponin I: <0.03 ng/mL (ref <0.03)

## 2016-08-24 MED ORDER — FAMOTIDINE 40 MG PO TABS: 40.0000 mg | ORAL_TABLET | Freq: Every evening | ORAL | 0 refills | Status: DC

## 2016-08-24 NOTE — ED Notes (Signed)
Pt to desk to inquire about wait, pt told longest wait at this time over 4 hours but impossible to predict how long it will take to get a room.  Pt walked out of ED in NAD.

## 2016-08-24 NOTE — ED Triage Notes (Signed)
Pt ambulatory to triage in NAD, report CP w/ sob, report hx of anxiety and atrial septal defect.

## 2016-08-24 NOTE — Telephone Encounter (Signed)
Called patient due to lwot to inquire about condition and follow up plans. Left message.   

## 2016-08-24 NOTE — ED Provider Notes (Signed)
[facility] Emergency Department Provider Note  ____________________________________________   First MD Initiated Contact with Patient 08/24/16 [phone]     (approximate)  I have reviewed the triage vital signs and the nursing notes.   HISTORY  Chief Complaint Chest Pain   HPI Ashley Howell is a 26 y.o. female with a history of an atrial septal defect who is presenting with central chest pain lasting 2 days. She says the pain has been constant and an 8 out of 10. She says it does not radiate to the back, arms of the neck. Says that it worsens with coughing and is a squeezing type pain. She has had similar pain in the past with coughing and upper respiratory illnesses. She says that it feels like bronchitis that she has had in the past. She says that she does smoke cigarettes. Has an Implanon but does not take any estrogen supplements. Does not report any worsening with activity. Denies any history of heart attack in young people in her family. Was concerned this could be something more serious and so presented to the emergency department for further evaluation. Says that she also sometimes has heartburn and indigestion. Says that she does report burning and dryness in her throat.   Past Medical History:  Diagnosis Date  . Anxiety   . GERD (gastroesophageal reflux disease)   . Heart defect   . Shortness of breath dyspnea     Patient Active Problem List   Diagnosis Date Noted  . S/P cesarean section 02/22/2015  . Obesity in pregnancy, antepartum 02/15/2015  . Previous cesarean delivery, antepartum condition or complication 02/08/2015  . Atrial septal defect 01/13/2015  . Cramping affecting pregnancy, antepartum 12/08/2014  . Labor and delivery, indication for care 11/12/2014  . Obesity affecting pregnancy 10/28/2014  . History of cardiomegaly 10/28/2014  . First trimester screening     Past Surgical History:  Procedure Laterality Date  . CESAREAN  SECTION    . CESAREAN SECTION N/A 02/22/2015   Procedure: CESAREAN SECTION;  Surgeon: Nadara Mustardobert P Harris, MD;  Location: [facility] ORS;  Service: Obstetrics;  Laterality: N/A;    Prior to Admission medications   Medication Sig Start Date End Date Taking? Authorizing Provider  albuterol (PROVENTIL HFA;VENTOLIN HFA) 108 (90 Base) MCG/ACT inhaler Inhale 2 puffs into the lungs every 6 (six) hours as needed for wheezing or shortness of breath. 05/15/16   Willy Eddyobinson, Patrick, MD  amoxicillin (AMOXIL) 875 MG tablet Take 1 tablet (875 mg total) by mouth 2 (two) times daily. 06/18/16   Cuthriell, Delorise RoyalsJonathan D, PA-C  aspirin 81 MG chewable tablet Chew 81 mg by mouth daily.    [provider]  chlorpheniramine-HYDROcodone (TUSSIONEX PENNKINETIC ER) 10-8 MG/5ML SUER Take 5 mLs by mouth at bedtime as needed for cough. 05/15/16   Willy Eddyobinson, Patrick, MD  ibuprofen (ADVIL,MOTRIN) 600 MG tablet Take 1 tablet (600 mg total) by mouth every 8 (eight) hours as needed. 02/13/16   Emily FilbertWilliams, Jonathan E, MD  ibuprofen (ADVIL,MOTRIN) 800 MG tablet Take 1 tablet (800 mg total) by mouth every 8 (eight) hours as needed for moderate pain. 02/23/16   Joni ReiningSmith, Ronald K, PA-C  magic mouthwash w/lidocaine SOLN Take 5 mLs by mouth 4 (four) times daily. 06/18/16   Cuthriell, Delorise RoyalsJonathan D, PA-C  oxyCODONE-acetaminophen (PERCOCET) 5-325 MG tablet Take 2 tablets by mouth every 6 (six) hours as needed for moderate pain or severe pain. 02/13/16   Emily FilbertWilliams, Jonathan E, MD    Allergies Patient has no  known allergies.  History reviewed. No pertinent family history.  Social History Social History  Substance Use Topics  . Smoking status: Current Every Day Smoker    Packs/day: 0.50    Types: Cigarettes    Last attempt to quit: 05/23/2014  . Smokeless tobacco: Never Used  . Alcohol use No    Review of Systems  Constitutional: No fever/chills Eyes: No visual changes. ENT: No sore throat. Cardiovascular: as above Respiratory: as  above Gastrointestinal: No abdominal pain.  No nausea, no vomiting.  No diarrhea.  No constipation. Genitourinary: Negative for dysuria. Musculoskeletal: Negative for back pain. Skin: Negative for rash. Neurological: Negative for headaches, focal weakness or numbness.   ____________________________________________   PHYSICAL EXAM:  VITAL SIGNS: ED Triage Vitals  Enc Vitals Group     BP 08/24/16 0425 (!) 147/89     Pulse Rate 08/24/16 0425 88     Resp 08/24/16 0425 18     Temp 08/24/16 0425 98.4 F (36.9 C)     Temp Source 08/24/16 0425 Oral     SpO2 08/24/16 0425 100 %     Weight 08/24/16 0426 260 lb (117.9 kg)     Height 08/24/16 0426 5\' 7"  (1.702 m)     Head Circumference --      Peak Flow --      Pain Score 08/24/16 0425 9     Pain Loc --      Pain Edu? --      Excl. in GC? --     Constitutional: Alert and oriented. Well appearing and in no acute distress. Eyes: Conjunctivae are normal.  Head: Atraumatic. Nose: No congestion/rhinnorhea. Mouth/Throat: Mucous membranes are moist.  Neck: No stridor.   Cardiovascular: Normal rate, regular rhythm. Grossly normal heart sounds.  Chest pain not reproduceable to palpation.   Respiratory: Normal respiratory effort.  No retractions. Lungs CTAB. Gastrointestinal: Soft and nontender. No distention. No CVA tenderness. Musculoskeletal: No lower extremity tenderness nor edema.  No joint effusions. Neurologic:  Normal speech and language. No gross focal neurologic deficits are appreciated. Skin:  Skin is warm, dry and intact. No rash noted. Psychiatric: Mood and affect are normal. Speech and behavior are normal.  ____________________________________________   LABS (all labs ordered are listed, but only abnormal results are displayed)  Labs Reviewed - No data to display ____________________________________________  EKG  ED ECG REPORT I, [place],  Teena Irani, the attending physician, personally viewed and interpreted this  ECG.   Date: 08/24/2016  EKG Time: 0026  Rate: 86  Rhythm: normal sinus rhythm  Axis: Normal  Intervals:none  ST&T Change: No ST segment elevation or depression. No abnormal T-wave inversion.  ____________________________________________  RADIOLOGY  No acute abnormality on the chest x-ray from last night. ____________________________________________   PROCEDURES  Procedure(s) performed:   Procedures  Critical Care performed:   ____________________________________________   INITIAL IMPRESSION / ASSESSMENT AND PLAN / ED COURSE  Pertinent labs & imaging results that were available during my care of the patient were reviewed by me and considered in my medical decision making (see chart for details).  PERC negative.    Patient well appearing. Likely bronchitis causing the cough and chest pain versus URI or allergies. I do not see any indication that this is a serious cause of chest pain that would warrant further admission such as a PE, ACS or aortic catastrophe. Patient will be discharged home. I also urged her to follow up with her cardiologist at [facility] which she is seen  in the past but has not seen recently because of difficulty getting appointment because she says it takes up to a month before that they can schedule an appointment in the office. I urged her to get somebody on the books shortly she has appointment to go to. She is understanding of this plan and willing to comply.      ____________________________________________   FINAL CLINICAL IMPRESSION(S) / ED DIAGNOSES  Chest pain.    NEW MEDICATIONS STARTED DURING THIS VISIT:  New Prescriptions   No medications on file     Note:  This document was prepared using Dragon voice recognition software and may include unintentional dictation errors.     Myrna Blazer, MD 08/24/16 [phone]

## 2016-08-24 NOTE — ED Triage Notes (Signed)
Pt ambulatory to triage in NAD, brought to ED by EMS from work, was seen here earlier but LWAT for chest pain and sob.  Denies any changes since she was triaged earlier.  Pt reports hx of atrial septal defect.

## 2017-04-11 LAB — HM PAP SMEAR: HM Pap smear: NEGATIVE

## 2017-05-21 IMAGING — CR DG CHEST 2V
2 series · 2 of 2 positions shown · non-contrast
Comparison: 02/23/2014, 04/30/2013.

CLINICAL DATA: 24-year-old female with left side chest pain for 4
days radiating to the shoulder. Initial encounter.

EXAM:
CHEST  2 VIEW

[chest pa]
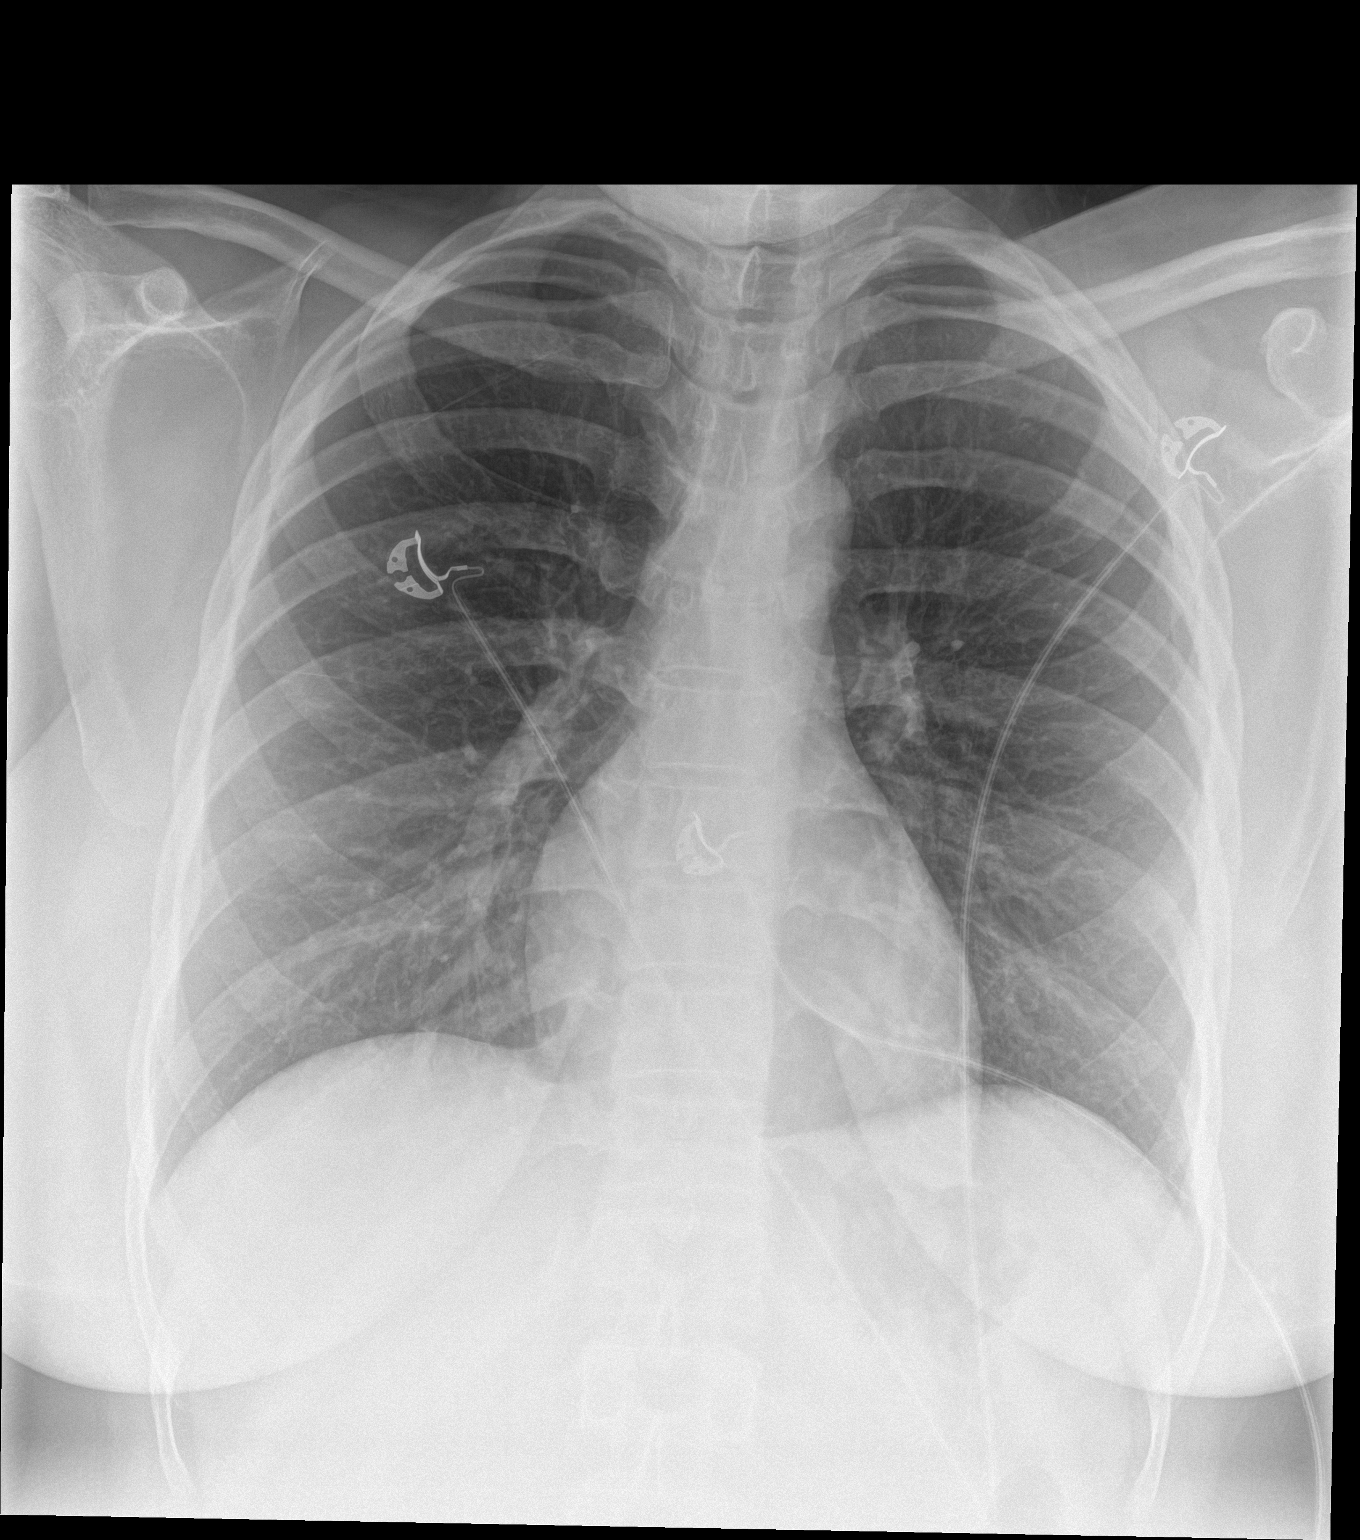

[chest lat]
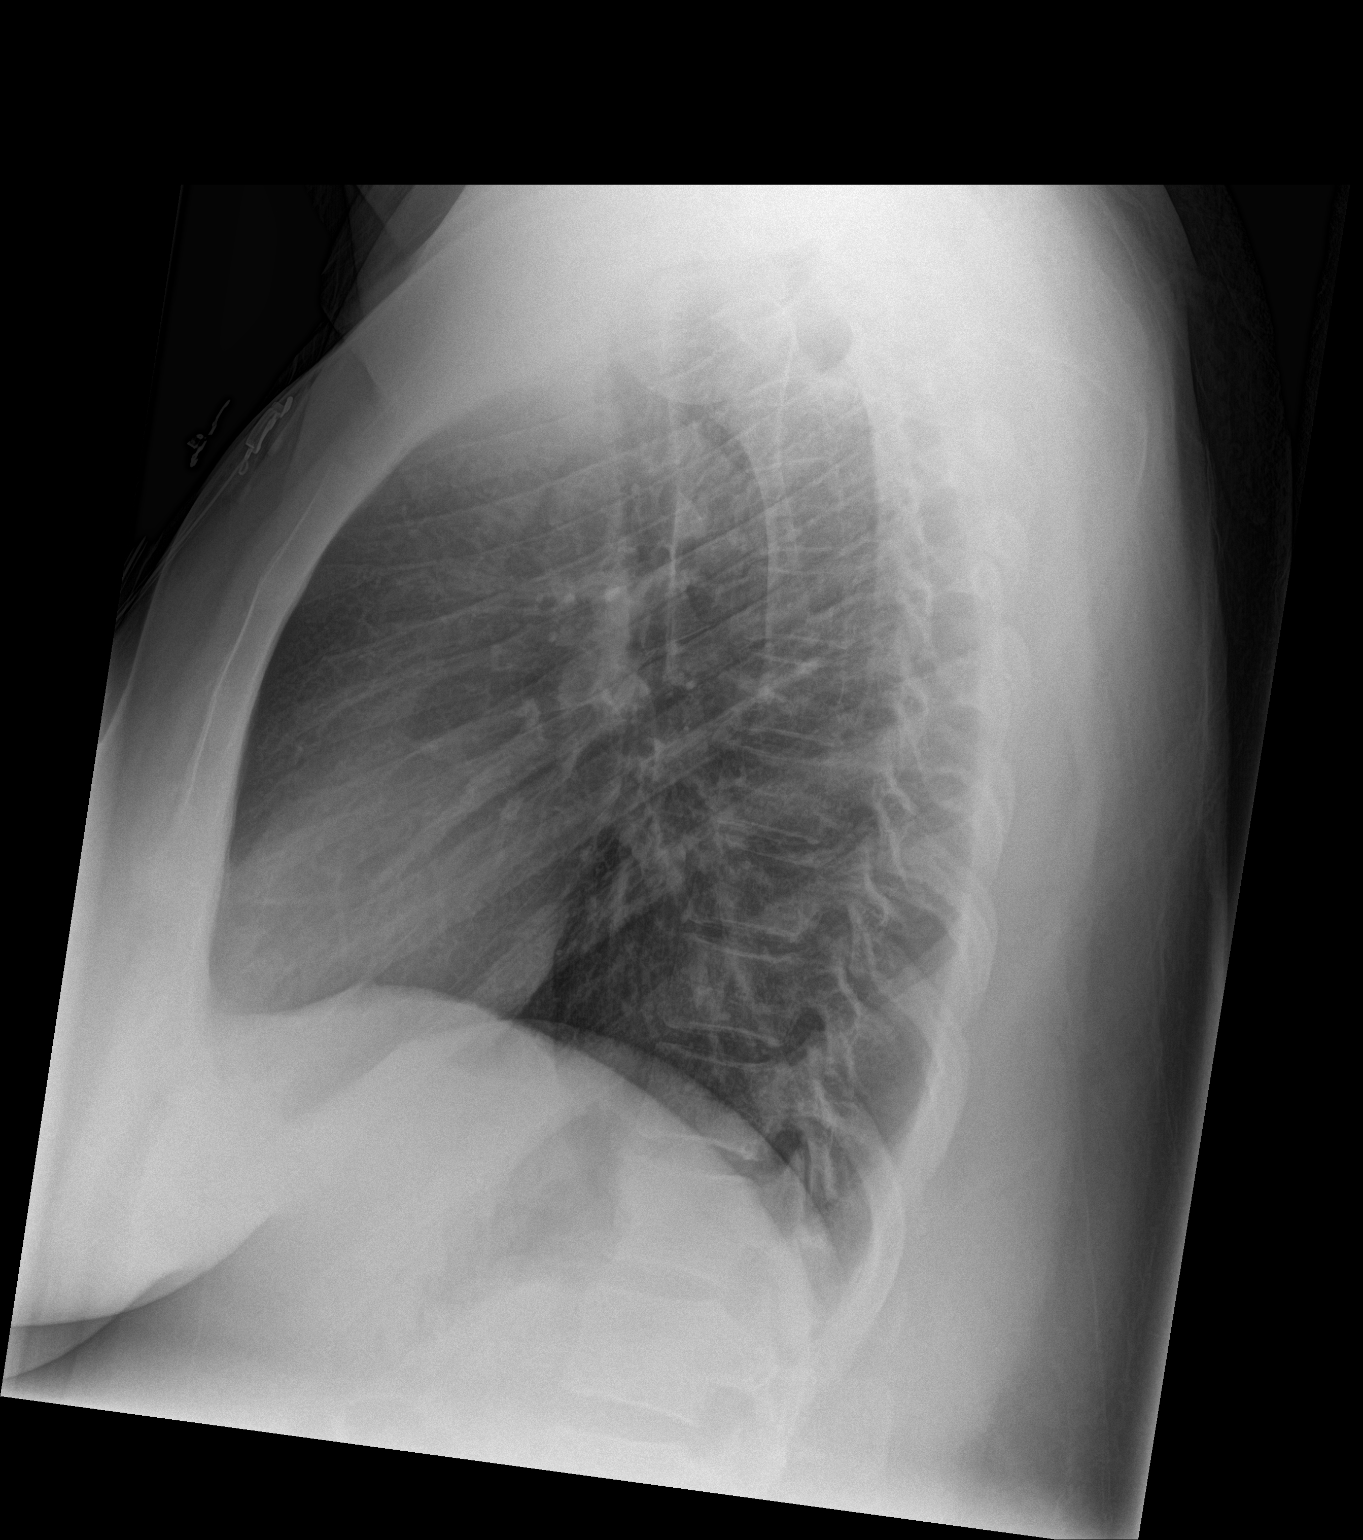

[2 of 2 positions shown; findings below may reference images not displayed]

FINDINGS: Normal lung volumes. Normal cardiac size and mediastinal contours.
Visualized tracheal air column is within normal limits. The lungs
are clear. No pneumothorax or pleural effusion. No osseous
abnormality identified.
IMPRESSION: Negative, no acute cardiopulmonary abnormality.

## 2017-08-31 ENCOUNTER — Other Ambulatory Visit: Payer: Self-pay

## 2017-08-31 ENCOUNTER — Encounter: Payer: Self-pay | Admitting: Emergency Medicine

## 2017-08-31 ENCOUNTER — Emergency Department
Admission: EM | Admit: 2017-08-31 | Discharge: 2017-08-31 | Disposition: A | Discharge status: Home / Self Care | Payer: Self-pay | Attending: Emergency Medicine | Admitting: Emergency Medicine

## 2017-08-31 DIAGNOSIS — Z7982 Long term (current) use of aspirin: Secondary | ICD-10-CM | POA: Insufficient documentation

## 2017-08-31 DIAGNOSIS — Z79899 Other long term (current) drug therapy: Secondary | ICD-10-CM | POA: Insufficient documentation

## 2017-08-31 DIAGNOSIS — F17210 Nicotine dependence, cigarettes, uncomplicated: Secondary | ICD-10-CM | POA: Insufficient documentation

## 2017-08-31 DIAGNOSIS — K529 Noninfective gastroenteritis and colitis, unspecified: Secondary | ICD-10-CM | POA: Insufficient documentation

## 2017-08-31 LAB — COMPREHENSIVE METABOLIC PANEL WITH GFR
ALT: 29 U/L (ref 14–54)
AST: 35 U/L (ref 15–41)
Albumin: 3.5 g/dL (ref 3.5–5.0)
Alkaline Phosphatase: 59 U/L (ref 38–126)
Anion gap: 5 (ref 5–15)
BUN: 10 mg/dL (ref 6–20)
CO2: 24 mmol/L (ref 22–32)
Calcium: 8.5 mg/dL — ABNORMAL LOW (ref 8.9–10.3)
Chloride: 109 mmol/L (ref 101–111)
Creatinine, Ser: 0.82 mg/dL (ref 0.44–1.00)
GFR calc Af Amer: >60 mL/min
GFR calc non Af Amer: >60 mL/min
Glucose, Bld: 101 mg/dL — ABNORMAL HIGH (ref 65–99)
Potassium: 3.4 mmol/L — ABNORMAL LOW (ref 3.5–5.1)
Sodium: 138 mmol/L (ref 135–145)
Total Bilirubin: 0.5 mg/dL (ref 0.3–1.2)
Total Protein: 7.1 g/dL (ref 6.5–8.1)

## 2017-08-31 LAB — CBC
HCT: 40.8 % (ref 35.0–47.0)
Hemoglobin: 14.4 g/dL (ref 12.0–16.0)
MCH: 29.5 pg (ref 26.0–34.0)
MCHC: 35.2 g/dL (ref 32.0–36.0)
MCV: 84 fL (ref 80.0–100.0)
Platelets: 277 10*3/uL (ref 150–440)
RBC: 4.86 MIL/uL (ref 3.80–5.20)
RDW: 13.1 % (ref 11.5–14.5)
WBC: 7.8 10*3/uL (ref 3.6–11.0)

## 2017-08-31 LAB — URINALYSIS, COMPLETE (UACMP) WITH MICROSCOPIC
Bacteria, UA: NONE SEEN
Bilirubin Urine: NEGATIVE
Glucose, UA: NEGATIVE mg/dL
Ketones, ur: NEGATIVE mg/dL
Leukocytes, UA: NEGATIVE
Nitrite: NEGATIVE
Protein, ur: NEGATIVE mg/dL
Specific Gravity, Urine: 1.029 (ref 1.005–1.030)
pH: 5 (ref 5.0–8.0)

## 2017-08-31 LAB — LIPASE, BLOOD: Lipase: 21 U/L (ref 11–51)

## 2017-08-31 LAB — POCT PREGNANCY, URINE: Preg Test, Ur: NEGATIVE

## 2017-08-31 MED ORDER — SODIUM CHLORIDE 0.9 % IV BOLUS
1000.0000 mL | Freq: Once | INTRAVENOUS | Status: AC
Start: 2017-08-31 — End: 2017-08-31
  Administered 2017-08-31: 1000 mL via INTRAVENOUS

## 2017-08-31 MED ORDER — ONDANSETRON HCL 4 MG/2ML IJ SOLN
4.0000 mg | Freq: Once | INTRAMUSCULAR | Status: AC
  Administered 2017-08-31: 4 mg via INTRAVENOUS
  Filled 2017-08-31: qty 2

## 2017-08-31 MED ORDER — LOPERAMIDE HCL 2 MG PO TABS: 2.0000 mg | ORAL_TABLET | Freq: Four times a day (QID) | ORAL | 0 refills | Status: DC | PRN

## 2017-08-31 MED ORDER — ONDANSETRON 4 MG PO TBDP: 4.0000 mg | ORAL_TABLET | Freq: Three times a day (TID) | ORAL | 0 refills | Status: DC | PRN

## 2017-08-31 NOTE — ED Notes (Signed)
Pt states N&V&D x 2 days. Denies fevers. No pain. 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 Denies seeing blood in vomit or stool. Alert, oriented, no distress noted at this time.

## 2017-08-31 NOTE — ED Provider Notes (Signed)
[facility] Emergency Department Provider Note  Time seen: 2:17 PM  I have reviewed the triage vital signs and the nursing notes.   HISTORY  Chief Complaint Emesis and Diarrhea    HPI Ashley BOHLIN is a 27 y.o. female with a past medical history of anxiety gastric reflux, presents to the emergency department for nausea vomiting diarrhea.  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  Patient states over the past 2 days she has been very nauseated with several episodes of vomiting last vomited this morning, has had diarrhea today 3 episodes.  Denies any pain in her abdomen besides abdominal cramping.  Denies any fever.  Largely negative review of systems otherwise.   Past Medical History:  Diagnosis Date  . Anxiety   . GERD (gastroesophageal reflux disease)   . Heart defect   . Shortness of breath dyspnea     Patient Active Problem List   Diagnosis Date Noted  . S/P cesarean section 02/22/2015  . Obesity in pregnancy, antepartum 02/15/2015  . Previous cesarean delivery, antepartum condition or complication 02/08/2015  . Atrial septal defect 01/13/2015  . Cramping affecting pregnancy, antepartum 12/08/2014  . Labor and delivery, indication for care 11/12/2014  . Obesity affecting pregnancy 10/28/2014  . History of cardiomegaly 10/28/2014  . First trimester screening     Past Surgical History:  Procedure Laterality Date  . CESAREAN SECTION    . CESAREAN SECTION N/A 02/22/2015   Procedure: CESAREAN SECTION;  Surgeon: Nadara Mustard, MD;  Location: [facility] ORS;  Service: Obstetrics;  Laterality: N/A;    Prior to Admission medications   Medication Sig Start Date End Date Taking? Authorizing Provider  aspirin 81 MG chewable tablet Chew 81 mg by mouth daily.   Yes [provider]  Multiple Vitamin (MULTIVITAMIN WITH MINERALS) TABS tablet Take 1 tablet by  mouth daily.   Yes [provider]    No Known Allergies  History reviewed. No pertinent family history.  Social History Social History   Tobacco Use  . Smoking status: Current Every Day Smoker    Packs/day: 0.50    Types: Cigarettes    Last attempt to quit: 05/23/2014    Years since quitting: 3.2  . Smokeless tobacco: Never Used  Substance Use Topics  . Alcohol use: No  . Drug use: No    Review of Systems Constitutional: Negative for fever. Eyes: Negative for visual complaints ENT: Negative for recent illness/congestion Cardiovascular: Negative for chest pain. Respiratory: Negative for shortness of breath. Gastrointestinal: No cramping.  Positive for nausea vomiting and diarrhea. Genitourinary: Negative for urinary compaints Musculoskeletal: Negative for musculoskeletal complaints Skin: Negative for skin complaints  Neurological: Negative for headache All other ROS negative  ____________________________________________   PHYSICAL EXAM:  VITAL SIGNS: ED Triage Vitals  Enc Vitals Group     BP 08/31/17 1251 (!) 139/91     Pulse Rate 08/31/17 1251 92     Resp 08/31/17 1251 18     Temp 08/31/17 1251 98.4 F (36.9 C)     Temp Source 08/31/17 1251 Oral     SpO2 08/31/17 1251 98 %     Weight 08/31/17 1254 260 lb (117.9 kg)     Height 08/31/17 1254  (1.702 m)     Head Circumference --      Peak Flow --      Pain Score 08/31/17 1254  0     Pain Loc --      Pain Edu? --      Excl. in GC? --     Constitutional: Alert and oriented. Well appearing and in no distress. Eyes: Normal exam ENT   Head: Normocephalic and atraumatic.   Mouth/Throat: Mucous membranes are moist. Cardiovascular: Normal rate, regular rhythm. No murmur Respiratory: Normal respiratory effort without tachypnea nor retractions. Breath sounds are clear Gastrointestinal: Soft and nontender. No distention.  Musculoskeletal: Nontender with normal range of motion in all  extremities.  Neurologic:  Normal speech and language. No gross focal neurologic deficits  Skin:  Skin is warm, dry and intact.  Psychiatric: Mood and affect are normal.   ____________________________________________  INITIAL IMPRESSION / ASSESSMENT AND PLAN / ED COURSE  Pertinent labs & imaging results that were available during my care of the patient were reviewed by me and considered in my medical decision making (see chart for details).  Patient presents to the emergency department for 2 days of nausea vomiting and diarrhea.  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  No abdominal tenderness on my examination.  No fever.  Differential would include gastroenteritis, enteritis, gastritis, intra-abdominal infection.  Overall the patient appears well, has a normal physical exam with no abdominal tenderness.  Patient's labs are largely within normal limits.  We will IV hydrate, treat with Zofran and closely monitor in the emergency department.  Patient has finished her fluids.  She is feeling better and wishes to be discharged home.  We will discharge with a work note for today and tomorrow.  I will also prescribe Zofran and loperamide for home use.  Patient agreeable to plan of care.  ____________________________________________   FINAL CLINICAL IMPRESSION(S) / ED DIAGNOSES  Gastroenteritis    Minna Antis, MD 08/31/17 [phone]

## 2017-08-31 NOTE — ED Triage Notes (Signed)
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 Pt states she last had vomiting and diarrhea this morning.  Pt states diarrhea is watery.  Pt denies any abdominal.  Pt is able to keep down water and some liquids.

## 2017-11-27 ENCOUNTER — Other Ambulatory Visit: Payer: Self-pay

## 2017-11-27 ENCOUNTER — Emergency Department
Admission: EM | Admit: 2017-11-27 | Discharge: 2017-11-27 | Disposition: A | Discharge status: Home / Self Care | Payer: Self-pay | Attending: Emergency Medicine | Admitting: Emergency Medicine

## 2017-11-27 ENCOUNTER — Emergency Department: Payer: Self-pay

## 2017-11-27 DIAGNOSIS — N730 Acute parametritis and pelvic cellulitis: Secondary | ICD-10-CM

## 2017-11-27 DIAGNOSIS — N739 Female pelvic inflammatory disease, unspecified: Secondary | ICD-10-CM | POA: Insufficient documentation

## 2017-11-27 DIAGNOSIS — F17210 Nicotine dependence, cigarettes, uncomplicated: Secondary | ICD-10-CM | POA: Insufficient documentation

## 2017-11-27 LAB — CBC
HCT: 40.3 % (ref 35.0–47.0)
HEMOGLOBIN: 13.3 g/dL (ref 12.0–16.0)
MCH: 27.8 pg (ref 26.0–34.0)
MCHC: 33.1 g/dL (ref 32.0–36.0)
MCV: 83.9 fL (ref 80.0–100.0)
Platelets: 319 10*3/uL (ref 150–440)
RBC: 4.8 MIL/uL (ref 3.80–5.20)
RDW: 13.7 % (ref 11.5–14.5)
WBC: 12.2 10*3/uL — ABNORMAL HIGH (ref 3.6–11.0)

## 2017-11-27 LAB — URINALYSIS, COMPLETE (UACMP) WITH MICROSCOPIC
Bacteria, UA: NONE SEEN
Bilirubin Urine: NEGATIVE
GLUCOSE, UA: NEGATIVE mg/dL
HGB URINE DIPSTICK: NEGATIVE
KETONES UR: NEGATIVE mg/dL
NITRITE: NEGATIVE
PROTEIN: NEGATIVE mg/dL
Specific Gravity, Urine: 1.023 (ref 1.005–1.030)
pH: 5 (ref 5.0–8.0)

## 2017-11-27 LAB — COMPREHENSIVE METABOLIC PANEL
ALK PHOS: 67 U/L (ref 38–126)
ALT: 41 U/L (ref 0–44)
ANION GAP: 7 (ref 5–15)
AST: 36 U/L (ref 15–41)
Albumin: 3.8 g/dL (ref 3.5–5.0)
BILIRUBIN TOTAL: 0.6 mg/dL (ref 0.3–1.2)
BUN: 10 mg/dL (ref 6–20)
CALCIUM: 9.1 mg/dL (ref 8.9–10.3)
CO2: 25 mmol/L (ref 22–32)
Chloride: 107 mmol/L (ref 98–111)
Creatinine, Ser: 0.62 mg/dL (ref 0.44–1.00)
GFR calc non Af Amer: >60 mL/min (ref >60)
Glucose, Bld: 108 mg/dL — ABNORMAL HIGH (ref 70–99)
Potassium: 3.6 mmol/L (ref 3.5–5.1)
SODIUM: 139 mmol/L (ref 135–145)
Total Protein: 7.7 g/dL (ref 6.5–8.1)

## 2017-11-27 LAB — WET PREP, GENITAL
CLUE CELLS WET PREP: NONE SEEN
Sperm: NONE SEEN
Yeast Wet Prep HPF POC: NONE SEEN

## 2017-11-27 LAB — LIPASE, BLOOD: Lipase: 18 U/L (ref 11–51)

## 2017-11-27 LAB — CHLAMYDIA/NGC RT PCR (ARMC ONLY)
CHLAMYDIA TR: NOT DETECTED
N gonorrhoeae: DETECTED — AB

## 2017-11-27 LAB — POCT PREGNANCY, URINE: Preg Test, Ur: NEGATIVE

## 2017-11-27 MED ORDER — HYDROCODONE-ACETAMINOPHEN 5-325 MG PO TABS: 1.0000 | ORAL_TABLET | ORAL | 0 refills | Status: DC | PRN

## 2017-11-27 MED ORDER — KETOROLAC TROMETHAMINE 60 MG/2ML IM SOLN
INTRAMUSCULAR | Status: AC
  Administered 2017-11-27: 60 mg via INTRAMUSCULAR
  Filled 2017-11-27: qty 2

## 2017-11-27 MED ORDER — KETOROLAC TROMETHAMINE 60 MG/2ML IM SOLN
60.0000 mg | Freq: Once | INTRAMUSCULAR | Status: AC
  Administered 2017-11-27: 60 mg via INTRAMUSCULAR

## 2017-11-27 NOTE — ED Notes (Signed)
Pt taken to US

## 2017-11-27 NOTE — ED Notes (Signed)
Pt given gown to change into and informed that MD will be performing a pelvic exam on pt shortly. Pt verbalized understanding.

## 2017-11-27 NOTE — ED Triage Notes (Signed)
Pelvic pain X 3 days. Pt was dx with PID yesterday. Given antibiotic shot. Pt reports no improvement. Pt alert and oriented X4, active, cooperative, pt in NAD. RR even and unlabored, color WNL.

## 2017-11-27 NOTE — ED Provider Notes (Signed)
[facility] Emergency Department Provider Note  Time seen: 4:23 PM  I have reviewed the triage vital signs and the nursing notes.   HISTORY  Chief Complaint Abdominal Pain; Emesis; and Diarrhea    HPI Ashley Howell is a 27 y.o. female with a past medical history of gastric reflux, anxiety, past history of PID who presents to the emergency department for 3 days of lower abdominal discomfort.  According to the patient for the past 3 days she has had very low aching pain in her lower abdomen/pelvis.  Also states mild vaginal discharge.  Denies any vomiting or diarrhea, no dysuria.  Went to the health department yesterday was diagnosed with PID was given a shot of antibiotics at the health department and sent home with Flagyl and doxycycline.  Patient states the pain is no better today, feels like it is somewhat worse so she came back to the emergency department.  States she has had PID once previously which is feels somewhat identical.  She states she is sexually active with one individual and has unprotected intercourse.  Denies any upper abdominal pain.  Denies any fever.   Past Medical History:  Diagnosis Date  . Anxiety   . GERD (gastroesophageal reflux disease)   . Heart defect   . Shortness of breath dyspnea     Patient Active Problem List   Diagnosis Date Noted  . S/P cesarean section 02/22/2015  . Obesity in pregnancy, antepartum 02/15/2015  . Previous cesarean delivery, antepartum condition or complication 02/08/2015  . Atrial septal defect 01/13/2015  . Cramping affecting pregnancy, antepartum 12/08/2014  . Labor and delivery, indication for care 11/12/2014  . Obesity affecting pregnancy 10/28/2014  . History of cardiomegaly 10/28/2014  . First trimester screening     Past Surgical History:  Procedure Laterality Date  . CESAREAN SECTION    . CESAREAN SECTION N/A 02/22/2015   Procedure: CESAREAN SECTION;  Surgeon: Nadara Mustardobert P Harris, MD;   Location: [facility] ORS;  Service: Obstetrics;  Laterality: N/A;    Prior to Admission medications   Medication Sig Start Date End Date Taking? Authorizing Provider  aspirin 81 MG chewable tablet Chew 81 mg by mouth daily.    [provider]  loperamide (IMODIUM A-D) 2 MG tablet Take 1 tablet (2 mg total) by mouth 4 (four) times daily as needed for diarrhea or loose stools. 08/31/17   Minna AntisPaduchowski, Manika Hast, MD  Multiple Vitamin (MULTIVITAMIN WITH MINERALS) TABS tablet Take 1 tablet by mouth daily.    [provider]  ondansetron (ZOFRAN ODT) 4 MG disintegrating tablet Take 1 tablet (4 mg total) by mouth every 8 (eight) hours as needed for nausea or vomiting. 08/31/17   Minna AntisPaduchowski, Harace Mccluney, MD    No Known Allergies  No family history on file.  Social History Social History   Tobacco Use  . Smoking status: Current Every Day Smoker    Packs/day: 0.50    Types: Cigarettes    Last attempt to quit: 05/23/2014    Years since quitting: 3.5  . Smokeless tobacco: Never Used  Substance Use Topics  . Alcohol use: No  . Drug use: No    Review of Systems Constitutional: Negative for fever. Cardiovascular: Negative for chest pain. Respiratory: Negative for shortness of breath. Gastrointestinal: Mid lower abdomen/pelvis aching discomfort moderate in severity.  Negative for vomiting or diarrhea Genitourinary: Negative for urinary compaints.  Mild vaginal discharge. Musculoskeletal: Negative for musculoskeletal complaints Skin: Negative for skin complaints  Neurological: Negative for  headache All other ROS negative  ____________________________________________   PHYSICAL EXAM:  VITAL SIGNS: ED Triage Vitals  Enc Vitals Group     BP 11/27/17 1410 (!) 140/94     Pulse Rate 11/27/17 1410 97     Resp 11/27/17 1410 16     Temp 11/27/17 1410 98.7 F (37.1 C)     Temp Source 11/27/17 1410 Oral     SpO2 11/27/17 1410 100 %     Weight 11/27/17 1411 260 lb (117.9 kg)     Height  11/27/17 1411 5\' 7"  (1.702 m)     Head Circumference --      Peak Flow --      Pain Score 11/27/17 1410 10     Pain Loc --      Pain Edu? --      Excl. in GC? --    Constitutional: Alert and oriented. Well appearing and in no distress. Eyes: Normal exam ENT   Head: Normocephalic and atraumatic.   Mouth/Throat: Mucous membranes are moist. Cardiovascular: Normal rate, regular rhythm. No murmurs, rubs, or gallops. Respiratory: Normal respiratory effort without tachypnea nor retractions. Breath sounds are clear  Gastrointestinal: Soft, mild suprapubic tenderness to palpation.  No rebound guarding or distention.  Abdomen otherwise benign. Musculoskeletal: Nontender with normal range of motion in all extremities. Neurologic:  Normal speech and language. No gross focal neurologic deficits  Skin:  Skin is warm, dry and intact.  Psychiatric: Mood and affect are normal.   ____________________________________________    RADIOLOGY  Ultrasound shows small amount of endocervical fluid  ____________________________________________   INITIAL IMPRESSION / ASSESSMENT AND PLAN / ED COURSE  Pertinent labs & imaging results that were available during my care of the patient were reviewed by me and considered in my medical decision making (see chart for details).  Patient presents to the emergency department for lower abdominal discomfort with vaginal discharge.  Differential would include STD/PID, UTI, intra-abdominal pathology such as colitis, diverticulitis, ovarian cyst, hemorrhagic cyst.  Patient's labs are resulted largely within normal limits besides a slight elevation in her white blood cell count of 12,200.  Patient has mild amount of white blood cells in the urine but no bacteria seen.  Denies dysuria.  We will send for urine culture.  I am unable to see the health department records I discussed with the patient we will repeat a pelvic examination today with repeat swabs.  Given the  patient's continued lower abdominal discomfort we will also obtain a pelvic ultrasound to help rule out other abnormality such as cyst or TOA.  Patient agreeable to plan of care.  Overall the patient appears well, no distress with mild tenderness on examination.  Patient is on appropriate antibiotic regimen for PID.  Pelvic examination shows mild diffuse tenderness, moderate amount of cervical discharge yellow in appearance.  Ultrasound shows small amount of endocervical fluid otherwise negative.  Patient's wet prep is positive for trichomoniasis.  Patient is on treatment which is appropriate Flagyl and doxycycline.  ____________________________________________   FINAL CLINICAL IMPRESSION(S) / ED DIAGNOSES  Pelvic inflammatory disease    Minna AntisPaduchowski, Baily Hovanec, MD 11/27/17 1907

## 2017-12-24 ENCOUNTER — Encounter: Payer: Self-pay | Admitting: Emergency Medicine

## 2017-12-24 ENCOUNTER — Emergency Department
Admission: EM | Admit: 2017-12-24 | Discharge: 2017-12-24 | Disposition: A | Discharge status: Home / Self Care | Payer: Self-pay | Attending: Emergency Medicine | Admitting: Emergency Medicine

## 2017-12-24 ENCOUNTER — Other Ambulatory Visit: Payer: Self-pay

## 2017-12-24 DIAGNOSIS — Z79899 Other long term (current) drug therapy: Secondary | ICD-10-CM | POA: Insufficient documentation

## 2017-12-24 DIAGNOSIS — J0100 Acute maxillary sinusitis, unspecified: Secondary | ICD-10-CM | POA: Insufficient documentation

## 2017-12-24 DIAGNOSIS — Z7982 Long term (current) use of aspirin: Secondary | ICD-10-CM | POA: Insufficient documentation

## 2017-12-24 DIAGNOSIS — F17210 Nicotine dependence, cigarettes, uncomplicated: Secondary | ICD-10-CM | POA: Insufficient documentation

## 2017-12-24 MED ORDER — ACETAMINOPHEN 325 MG PO TABS
650.0000 mg | ORAL_TABLET | Freq: Once | ORAL | Status: AC
  Administered 2017-12-24: 650 mg via ORAL
  Filled 2017-12-24: qty 2

## 2017-12-24 MED ORDER — PSEUDOEPH-BROMPHEN-DM 30-2-10 MG/5ML PO SYRP: 10.0000 mL | ORAL_SOLUTION | Freq: Four times a day (QID) | ORAL | 0 refills | Status: DC | PRN

## 2017-12-24 MED ORDER — AMOXICILLIN-POT CLAVULANATE 875-125 MG PO TABS: 1.0000 | ORAL_TABLET | Freq: Two times a day (BID) | ORAL | 0 refills | Status: DC

## 2017-12-24 MED ORDER — IBUPROFEN 600 MG PO TABS
600.0000 mg | ORAL_TABLET | Freq: Once | ORAL | Status: AC
  Administered 2017-12-24: 600 mg via ORAL
  Filled 2017-12-24: qty 1

## 2017-12-24 MED ORDER — FLUTICASONE PROPIONATE 50 MCG/ACT NA SUSP: 1.0000 | Freq: Two times a day (BID) | NASAL | 0 refills | Status: DC

## 2017-12-24 MED ORDER — AMOXICILLIN-POT CLAVULANATE 875-125 MG PO TABS
1.0000 | ORAL_TABLET | Freq: Once | ORAL | Status: AC
  Administered 2017-12-24: 1 via ORAL
  Filled 2017-12-24: qty 1

## 2017-12-24 NOTE — ED Notes (Signed)
PT states that her symptoms started two days ago. Symptoms include being Light headed, chills, body aches, coughing, light headed, and her face hurts.

## 2017-12-24 NOTE — ED Provider Notes (Signed)
[facility] Emergency Department Provider Note  ____________________________________________  Time seen: Approximately 8:58 PM  I have reviewed the triage vital signs and the nursing notes.   HISTORY  Chief Complaint Cough and Nasal Congestion    HPI Ashley Howell is a 27 y.o. female who presents the emergency department complaining of nasal congestion, sinus pressure, scratchy throat, cough, body aches, fevers.  Patient presents with 2-day history of above symptoms.  She denies any headache, neck pain or stiffness, chest pain, shortness of breath, abdominal pain, nausea or vomiting.  Patient took 1 dose of NyQuil and states that this did not alleviate her symptoms.  No other complaints at this time.  No other medications prior to arrival.    Past Medical History:  Diagnosis Date  . Anxiety   . GERD (gastroesophageal reflux disease)   . Heart defect   . Shortness of breath dyspnea     Patient Active Problem List   Diagnosis Date Noted  . S/P cesarean section 02/22/2015  . Obesity in pregnancy, antepartum 02/15/2015  . Previous cesarean delivery, antepartum condition or complication 02/08/2015  . Atrial septal defect 01/13/2015  . Cramping affecting pregnancy, antepartum 12/08/2014  . Labor and delivery, indication for care 11/12/2014  . Obesity affecting pregnancy 10/28/2014  . History of cardiomegaly 10/28/2014  . First trimester screening     Past Surgical History:  Procedure Laterality Date  . CESAREAN SECTION    . CESAREAN SECTION N/A 02/22/2015   Procedure: CESAREAN SECTION;  Surgeon: Nadara Mustardobert P Harris, MD;  Location: [facility] ORS;  Service: Obstetrics;  Laterality: N/A;    Prior to Admission medications   Medication Sig Start Date End Date Taking? Authorizing Provider  amoxicillin-clavulanate (AUGMENTIN) 875-125 MG tablet Take 1 tablet by mouth 2 (two) times daily. 12/24/17   Cailah Reach, Delorise RoyalsJonathan D, PA-C  aspirin 81 MG chewable tablet Chew  81 mg by mouth daily.    [provider]  brompheniramine-pseudoephedrine-DM 30-2-10 MG/5ML syrup Take 10 mLs by mouth 4 (four) times daily as needed. 12/24/17   Muhamed Luecke, Delorise RoyalsJonathan D, PA-C  fluticasone (FLONASE) 50 MCG/ACT nasal spray Place 1 spray into both nostrils 2 (two) times daily. 12/24/17   Taleeyah Bora, Delorise RoyalsJonathan D, PA-C  HYDROcodone-acetaminophen (NORCO/VICODIN) 5-325 MG tablet Take 1 tablet by mouth every 4 (four) hours as needed. 11/27/17   Minna AntisPaduchowski, Kevin, MD  loperamide (IMODIUM A-D) 2 MG tablet Take 1 tablet (2 mg total) by mouth 4 (four) times daily as needed for diarrhea or loose stools. 08/31/17   Minna AntisPaduchowski, Kevin, MD  Multiple Vitamin (MULTIVITAMIN WITH MINERALS) TABS tablet Take 1 tablet by mouth daily.    [provider]  ondansetron (ZOFRAN ODT) 4 MG disintegrating tablet Take 1 tablet (4 mg total) by mouth every 8 (eight) hours as needed for nausea or vomiting. 08/31/17   Minna AntisPaduchowski, Kevin, MD    Allergies Patient has no known allergies.  No family history on file.  Social History Social History   Tobacco Use  . Smoking status: Current Every Day Smoker    Packs/day: 0.50    Types: Cigarettes    Last attempt to quit: 05/23/2014    Years since quitting: 3.5  . Smokeless tobacco: Never Used  Substance Use Topics  . Alcohol use: No  . Drug use: No     Review of Systems  Constitutional: Positive fever/chills Eyes: No visual changes. No discharge ENT: Positive for nasal congestion, sinus pressure, sore throat Cardiovascular: no chest pain. Respiratory: Positive cough. No SOB.  Gastrointestinal: No abdominal pain.  No nausea, no vomiting.  No diarrhea.  No constipation. Musculoskeletal: Negative for musculoskeletal pain. Skin: Negative for rash, abrasions, lacerations, ecchymosis. Neurological: Negative for headaches, focal weakness or numbness. 10-point ROS otherwise negative.  ____________________________________________   PHYSICAL  EXAM:  VITAL SIGNS: ED Triage Vitals [12/24/17 1925]  Enc Vitals Group     BP 140/80     Pulse Rate (!) 107     Resp 20     Temp 100.2 F (37.9 C)     Temp Source Oral     SpO2 97 %     Weight 260 lb (117.9 kg)     Height 5\' 7"  (1.702 m)     Head Circumference      Peak Flow      Pain Score 10     Pain Loc      Pain Edu?      Excl. in GC?      Constitutional: Alert and oriented. Well appearing and in no acute distress. Eyes: Conjunctivae are normal. PERRL. EOMI. Head: Atraumatic. ENT:      Ears: EACs and TMs unremarkable bilaterally.      Nose: Moderate clear congestion/rhinnorhea.  Patient is tender to percussion over the maxillary sinuses, worse on the left than right.      Mouth/Throat: Mucous membranes are moist.  Oropharynx is minimally erythematous, nonedematous.  Uvula is midline. Neck: No stridor.  Neck is supple full range of motion Hematological/Lymphatic/Immunilogical: No cervical lymphadenopathy. Cardiovascular: Normal rate, regular rhythm. Normal S1 and S2.  Good peripheral circulation. Respiratory: Normal respiratory effort without tachypnea or retractions. Lungs CTAB. Good air entry to the bases with no decreased or absent breath sounds. Musculoskeletal: Full range of motion to all extremities. No gross deformities appreciated. Neurologic:  Normal speech and language. No gross focal neurologic deficits are appreciated.  Skin:  Skin is warm, dry and intact. No rash noted. Psychiatric: Mood and affect are normal. Speech and behavior are normal. Patient exhibits appropriate insight and judgement.   ____________________________________________   LABS (all labs ordered are listed, but only abnormal results are displayed)  Labs Reviewed - No data to display ____________________________________________  EKG   ____________________________________________  RADIOLOGY   No results  found.  ____________________________________________    PROCEDURES  Procedure(s) performed:    Procedures    Medications  acetaminophen (TYLENOL) tablet 650 mg (650 mg Oral Given 12/24/17 2107)  ibuprofen (ADVIL,MOTRIN) tablet 600 mg (600 mg Oral Given 12/24/17 2107)  amoxicillin-clavulanate (AUGMENTIN) 875-125 MG per tablet 1 tablet (1 tablet Oral Given 12/24/17 2107)     ____________________________________________   INITIAL IMPRESSION / ASSESSMENT AND PLAN / ED COURSE  Pertinent labs & imaging results that were available during my care of the patient were reviewed by me and considered in my medical decision making (see chart for details).  Review of the [place] CSRS was performed in accordance of the [facility] prior to dispensing any controlled drugs.      Patient's diagnosis is consistent with bacterial sinusitis.  Patient presents emergency department with 2-day history of symptoms.  Exam is overall reassuring.  No indication for labs or imaging.  Differential included viral URI, sinusitis, strep, bronchitis, pneumonia.  Patient is given Tylenol, Motrin, first dose of antibiotic in the emergency department.. Patient will be discharged home with prescriptions for Augmentin, Flonase, Bromfed cough syrup. Patient is to follow up with primary care as needed or otherwise directed. Patient is given ED precautions to return to the ED  for any worsening or new symptoms.     ____________________________________________  FINAL CLINICAL IMPRESSION(S) / ED DIAGNOSES  Final diagnoses:  Acute non-recurrent maxillary sinusitis      NEW MEDICATIONS STARTED DURING THIS VISIT:  ED Discharge Orders         Ordered    fluticasone (FLONASE) 50 MCG/ACT nasal spray  2 times daily     12/24/17 2107    amoxicillin-clavulanate (AUGMENTIN) 875-125 MG tablet  2 times daily     12/24/17 2107    brompheniramine-pseudoephedrine-DM 30-2-10 MG/5ML syrup  4 times daily PRN     12/24/17 2107               This chart was dictated using voice recognition software/Dragon. Despite best efforts to proofread, errors can occur which can change the meaning. Any change was purely unintentional.    Racheal Patches, PA-C 12/24/17 2109    Jeanmarie Plant, MD 12/24/17 [phone]

## 2017-12-24 NOTE — ED Triage Notes (Signed)
Patient ambulatory to triage with steady gait, without difficulty or distress noted, mask in place; pt reports nonprod cough, congestion, sinus pressure , body aches x 2 days

## 2018-03-07 ENCOUNTER — Emergency Department: Payer: Self-pay

## 2018-03-07 ENCOUNTER — Other Ambulatory Visit: Payer: Self-pay

## 2018-03-07 ENCOUNTER — Emergency Department
Admission: EM | Admit: 2018-03-07 | Discharge: 2018-03-07 | Disposition: A | Discharge status: Home / Self Care | Payer: Self-pay | Attending: Student in an Organized Health Care Education/Training Program | Admitting: Student in an Organized Health Care Education/Training Program

## 2018-03-07 DIAGNOSIS — F17210 Nicotine dependence, cigarettes, uncomplicated: Secondary | ICD-10-CM | POA: Insufficient documentation

## 2018-03-07 DIAGNOSIS — Z3A08 8 weeks gestation of pregnancy: Secondary | ICD-10-CM | POA: Insufficient documentation

## 2018-03-07 DIAGNOSIS — O219 Vomiting of pregnancy, unspecified: Secondary | ICD-10-CM | POA: Insufficient documentation

## 2018-03-07 DIAGNOSIS — O26891 Other specified pregnancy related conditions, first trimester: Secondary | ICD-10-CM | POA: Insufficient documentation

## 2018-03-07 DIAGNOSIS — R109 Unspecified abdominal pain: Secondary | ICD-10-CM

## 2018-03-07 DIAGNOSIS — Z79899 Other long term (current) drug therapy: Secondary | ICD-10-CM | POA: Insufficient documentation

## 2018-03-07 DIAGNOSIS — Z3491 Encounter for supervision of normal pregnancy, unspecified, first trimester: Secondary | ICD-10-CM | POA: Insufficient documentation

## 2018-03-07 LAB — HCG, QUANTITATIVE, PREGNANCY: hCG, Beta Chain, Quant, S: 69393 m[IU]/mL — ABNORMAL HIGH (ref <5)

## 2018-03-07 NOTE — Discharge Instructions (Signed)
Your ultrasound shows a normal intrauterine pregnancy at 8 weeks and 2 days.  Please establish care with Ob.  Return to the emergency department immediately for any abdominal pain or vaginal bleeding.

## 2018-03-07 NOTE — ED Triage Notes (Signed)
Pt states she has had multiple positive home pregnancy test and was having all of the sx, N/V but states about 2 weeks ago all of the sx stopped and was concerned it is a silent miscarriage. Denies bleeding or abd pain. States [facility].

## 2018-03-07 NOTE — ED Provider Notes (Signed)
[facility] Emergency Department Provider Note  ____________________________________________  Time seen: Approximately 10:19 AM  I have reviewed the triage vital signs and the nursing notes.   HISTORY  Chief Complaint Possible Pregnancy    HPI Ashley Howell is a 27 y.o. female that presents emergency department for abdominal pain last night and multiple positive home pregnancy test.  Patient states that first pregnancy test was positive 13 days ago.  Patient initially took a pregnancy test because she was vomiting, which she had during her previous pregnancies.  She has had nausea and vomiting in the morning most days since.  Two days ago, nausea and vomiting stopped.  She has 3 children and states that she had nausea and vomiting for 2 to 3 months during pregnancy.  She was concerned that the vomiting stopped so suddenly that there may be something wrong so she came to the emergency deparmtnet.  She had some low mid abdominal cramping last night, none today.  She took a pregnancy test last night that was positive. She has had 5 positive pregnancy tests.    Last menstrual period was October 11.  No vaginal bleeding, vaginal discharge.  Past Medical History:  Diagnosis Date  . Anxiety   . GERD (gastroesophageal reflux disease)   . Heart defect   . Shortness of breath dyspnea     Patient Active Problem List   Diagnosis Date Noted  . S/P cesarean section 02/22/2015  . Obesity in pregnancy, antepartum 02/15/2015  . Previous cesarean delivery, antepartum condition or complication 02/08/2015  . Atrial septal defect 01/13/2015  . Cramping affecting pregnancy, antepartum 12/08/2014  . Labor and delivery, indication for care 11/12/2014  . Obesity affecting pregnancy 10/28/2014  . History of cardiomegaly 10/28/2014  . First trimester screening     Past Surgical History:  Procedure Laterality Date  . CESAREAN SECTION    . CESAREAN SECTION N/A 02/22/2015    Procedure: CESAREAN SECTION;  Surgeon: Nadara Mustardobert P Harris, MD;  Location: [facility] ORS;  Service: Obstetrics;  Laterality: N/A;    Prior to Admission medications   Medication Sig Start Date End Date Taking? Authorizing Provider  amoxicillin-clavulanate (AUGMENTIN) 875-125 MG tablet Take 1 tablet by mouth 2 (two) times daily. 12/24/17   Cuthriell, Delorise RoyalsJonathan D, PA-C  aspirin 81 MG chewable tablet Chew 81 mg by mouth daily.    [provider]  brompheniramine-pseudoephedrine-DM 30-2-10 MG/5ML syrup Take 10 mLs by mouth 4 (four) times daily as needed. 12/24/17   Cuthriell, Delorise RoyalsJonathan D, PA-C  fluticasone (FLONASE) 50 MCG/ACT nasal spray Place 1 spray into both nostrils 2 (two) times daily. 12/24/17   Cuthriell, Delorise RoyalsJonathan D, PA-C  HYDROcodone-acetaminophen (NORCO/VICODIN) 5-325 MG tablet Take 1 tablet by mouth every 4 (four) hours as needed. 11/27/17   Minna AntisPaduchowski, Kevin, MD  loperamide (IMODIUM A-D) 2 MG tablet Take 1 tablet (2 mg total) by mouth 4 (four) times daily as needed for diarrhea or loose stools. 08/31/17   Minna AntisPaduchowski, Kevin, MD  Multiple Vitamin (MULTIVITAMIN WITH MINERALS) TABS tablet Take 1 tablet by mouth daily.    [provider]  ondansetron (ZOFRAN ODT) 4 MG disintegrating tablet Take 1 tablet (4 mg total) by mouth every 8 (eight) hours as needed for nausea or vomiting. 08/31/17   Minna AntisPaduchowski, Kevin, MD    Allergies Patient has no known allergies.  No family history on file.  Social History Social History   Tobacco Use  . Smoking status: Current Every Day Smoker    Packs/day: 0.50  Types: Cigarettes    Last attempt to quit: 05/23/2014    Years since quitting: 3.7  . Smokeless tobacco: Never Used  Substance Use Topics  . Alcohol use: No  . Drug use: No     Review of Systems  Constitutional: No fever/chills Cardiovascular: No chest pain. Respiratory:  No SOB. Gastrointestinal: See HPI. Musculoskeletal: Negative for musculoskeletal pain. Skin: Negative for rash,  abrasions, lacerations, ecchymosis.   ____________________________________________   PHYSICAL EXAM:  VITAL SIGNS: ED Triage Vitals [03/07/18 0949]  Enc Vitals Group     BP (!) 145/80     Pulse Rate 100     Resp 17     Temp 98.3 F (36.8 C)     Temp Source Oral     SpO2 100 %     Weight 260 lb (117.9 kg)     Height 5\' 7"  (1.702 m)     Head Circumference      Peak Flow      Pain Score 0     Pain Loc      Pain Edu?      Excl. in GC?      Constitutional: Alert and oriented. Well appearing and in no acute distress. Eyes: Conjunctivae are normal. PERRL. EOMI. Head: Atraumatic. ENT:      Ears:      Nose: No congestion/rhinnorhea.      Mouth/Throat: Mucous membranes are moist.  Neck: No stridor.   Cardiovascular: Normal rate, regular rhythm.  Good peripheral circulation. Respiratory: Normal respiratory effort without tachypnea or retractions. Lungs CTAB. Good air entry to the bases with no decreased or absent breath sounds. Gastrointestinal: Bowel sounds 4 quadrants. Soft and nontender to palpation. No guarding or rigidity. No palpable masses. No distention.  Musculoskeletal: Full range of motion to all extremities. No gross deformities appreciated. Neurologic:  Normal speech and language. No gross focal neurologic deficits are appreciated.  Skin:  Skin is warm, dry and intact. No rash noted. Psychiatric: Mood and affect are normal. Speech and behavior are normal. Patient exhibits appropriate insight and judgement.   ____________________________________________   LABS (all labs ordered are listed, but only abnormal results are displayed)  Labs Reviewed  HCG, QUANTITATIVE, PREGNANCY - Abnormal; Notable for the following components:      Result Value   hCG, Beta Chain, Quant, [place] 91,478 (*)    All other components within normal limits   ____________________________________________  EKG   ____________________________________________  RADIOLOGY   US Ob Less Than  14 Weeks With Ob Transvaginal  Result Date: 03/07/2018 CLINICAL DATA:  First trimester of pregnancy, abdominal cramping. EXAM: OBSTETRIC <14 WK [place] AND TRANSVAGINAL OB US TECHNIQUE: Both transabdominal and transvaginal ultrasound examinations were performed for complete evaluation of the gestation as well as the maternal uterus, adnexal regions, and pelvic cul-de-sac. Transvaginal technique was performed to assess early pregnancy. COMPARISON:  None. FINDINGS: Intrauterine gestational sac: Single visualized. Yolk sac:  Visualized. Embryo:  Visualized. Cardiac Activity: Visualized. Heart Rate: 165 bpm CRL:  17.6 mm   8 w   2 d                  [place] EDC: October 15, 2018. Subchorionic hemorrhage:  None visualized. Maternal uterus/adnexae: Both ovaries are unremarkable. No free fluid is noted. IMPRESSION: Single live intrauterine gestation of 8 weeks 2 days. Electronically Signed   By: Lupita Raider, M.D.   On: 03/07/2018 12:17    ____________________________________________    PROCEDURES  Procedure(s) performed:  Procedures    Medications - No data to display   ____________________________________________   INITIAL IMPRESSION / ASSESSMENT AND PLAN / ED COURSE  Pertinent labs & imaging results that were available during my care of the patient were reviewed by me and considered in my medical decision making (see chart for details).  Review of the [place] CSRS was performed in accordance of the [facility] prior to dispensing any controlled drugs.     Patient's diagnosis is consistent with first trimester pregnancy.  Beta hCG consistent with pregnancy greater than 6 weeks.  Ultrasound consistent with single intrauterine pregnancy at 8 weeks and 2 days. Exam is reassuring. Patient is already taking prenatal vitamins. Patient is to follow up with OB/GYN as directed. Patient is given ED precautions to return to the ED for any worsening or new  symptoms.     ____________________________________________  FINAL CLINICAL IMPRESSION(S) / ED DIAGNOSES  Final diagnoses:  First trimester pregnancy      NEW MEDICATIONS STARTED DURING THIS VISIT:  ED Discharge Orders    None          This chart was dictated using voice recognition software/Dragon. Despite best efforts to proofread, errors can occur which can change the meaning. Any change was purely unintentional.    Enid Derry, PA-C 03/07/18 1339    Willy Eddy, MD 03/07/18 [phone]

## 2018-03-31 LAB — HM HIV SCREENING LAB: HM HIV Screening: NEGATIVE

## 2018-04-01 ENCOUNTER — Other Ambulatory Visit: Payer: Self-pay | Admitting: Nurse Practitioner

## 2018-04-01 DIAGNOSIS — Z369 Encounter for antenatal screening, unspecified: Secondary | ICD-10-CM

## 2018-04-07 ENCOUNTER — Ambulatory Visit (HOSPITAL_BASED_OUTPATIENT_CLINIC_OR_DEPARTMENT_OTHER)
Admission: RE | Admit: 2018-04-07 | Discharge: 2018-04-07 | Disposition: A | Discharge status: Home / Self Care | Payer: Self-pay | Source: Ambulatory Visit | Attending: Obstetrics and Gynecology | Admitting: Obstetrics and Gynecology

## 2018-04-07 ENCOUNTER — Ambulatory Visit
Admission: RE | Admit: 2018-04-07 | Discharge: 2018-04-07 | Disposition: A | Discharge status: Home / Self Care | Payer: Self-pay | Source: Ambulatory Visit | Attending: Nurse Practitioner | Admitting: Nurse Practitioner

## 2018-04-07 DIAGNOSIS — Q211 Atrial septal defect, unspecified: Secondary | ICD-10-CM

## 2018-04-07 DIAGNOSIS — Z3682 Encounter for antenatal screening for nuchal translucency: Secondary | ICD-10-CM | POA: Insufficient documentation

## 2018-04-07 DIAGNOSIS — Z3A12 12 weeks gestation of pregnancy: Secondary | ICD-10-CM | POA: Insufficient documentation

## 2018-04-07 DIAGNOSIS — Z8679 Personal history of other diseases of the circulatory system: Secondary | ICD-10-CM | POA: Insufficient documentation

## 2018-04-07 DIAGNOSIS — Z369 Encounter for antenatal screening, unspecified: Secondary | ICD-10-CM | POA: Insufficient documentation

## 2018-04-07 DIAGNOSIS — Z98891 History of uterine scar from previous surgery: Secondary | ICD-10-CM

## 2018-04-07 DIAGNOSIS — O99210 Obesity complicating pregnancy, unspecified trimester: Secondary | ICD-10-CM | POA: Diagnosis present

## 2018-04-07 DIAGNOSIS — O99211 Obesity complicating pregnancy, first trimester: Secondary | ICD-10-CM | POA: Insufficient documentation

## 2018-04-07 NOTE — Addendum Note (Signed)
Encounter addended by: Jimmey RalphLivingston, Corvette Orser, MD on: 04/07/2018 3:43 PM  Actions taken: Visit diagnoses modified, Diagnosis association updated, Order list changed

## 2018-04-07 NOTE — Progress Notes (Addendum)
Burr of Consultation: Howell minutes   Ashley Howell  was referred to [facility] for genetic counseling to review prenatal screening and testing options.  She was seen for genetic counseling previously in 2016, so we reviewed the family history information from that visit. This note summarizes the information we discussed.    We offered the following routine screening tests for this pregnancy:  First trimester screening, which includes nuchal translucency ultrasound screen and first trimester maternal serum marker screening.  The nuchal translucency has approximately an 80% detection rate for Ashley Howell and can be positive for other chromosome abnormalities as well as congenital heart defects.  When combined with a maternal serum marker screening, the detection rate is up to 90% for Ashley Howell and up to 97% for Ashley Ashley Howell.     Maternal serum marker screening, a blood test that measures pregnancy proteins, can provide risk assessments for Ashley Howell, Ashley Ashley Howell, and open neural tube defects (spina bifida, anencephaly). Because it does not directly examine the fetus, it cannot positively diagnose or rule out these problems.  Targeted ultrasound uses high frequency sound waves to create an image of the developing fetus.  An ultrasound is often recommended as a routine means of evaluating the pregnancy.  It is also used to screen for fetal anatomy problems (for example, a heart defect) that might be suggestive of a chromosomal or other abnormality.   Should these screening tests indicate an increased concern, then the following additional testing options would be offered:  The chorionic villus sampling procedure is available for first trimester chromosome analysis.  This involves the withdrawal of a small amount of chorionic villi (tissue from the developing placenta).  Risk of pregnancy loss is estimated to be approximately 1 in 200 to 1 in  100 (0.5 to 1%).  There is approximately a 1% (1 in 100) chance that the CVS chromosome results will be unclear.  Chorionic villi cannot be tested for neural tube defects.     Amniocentesis involves the removal of a small amount of amniotic fluid from the sac surrounding the fetus with the use of a thin needle inserted through the maternal abdomen and uterus.  Ultrasound guidance is used throughout the procedure.  Fetal cells from amniotic fluid are directly evaluated and > 99.5% of chromosome problems and > 98% of open neural tube defects can be detected. This procedure is generally performed after the 15th week of pregnancy.  The main risks to this procedure include complications leading to miscarriage in less than 1 in 200 cases (0.5%).  As another option for information if the pregnancy is suspected to be an an increased chance for certain chromosome conditions, we also reviewed the availability of cell free fetal DNA testing from maternal blood to determine whether or not the baby may have either Ashley Howell, Ashley Howell, or Ashley Howell.  This test utilizes a maternal blood sample and DNA sequencing technology to isolate circulating cell free fetal DNA from maternal plasma.  The fetal DNA can then be analyzed for DNA sequences that are derived from the three most common Ashley involved in aneuploidy, Ashley Howell, Ashley Howell, and Ashley Howell.  If the overall amount of DNA is greater than the expected level for any of these Ashley, aneuploidy is suspected.  While we do not consider it a replacement for invasive testing and karyotype analysis, a negative result from this testing would be reassuring, though not a guarantee of a normal chromosome complement  for the baby.  An abnormal result is certainly suggestive of an abnormal chromosome complement, though we would still recommend CVS or amniocentesis to confirm any findings from this testing.  Ashley Howell and Ashley Muscular Atrophy (SMA) screening were  also discussed with the patient. Both conditions are recessive, which means that both parents must be carriers in order to have a child with the disease.  Ashley Howell (CF) is one of the most common genetic conditions in persons of Caucasian ancestry.  This condition occurs in approximately 1 in 2,500 Caucasian persons and results in thickened secretions in the lungs, digestive, and reproductive systems.  For a baby to be at risk for having CF, both of the parents must be carriers for this condition.  Approximately 1 in 15 Caucasian persons is a carrier for CF.  Current carrier testing looks for the most common mutations in the gene for CF and can detect approximately 90% of carriers in the Caucasian population.  This means that the carrier screening can greatly reduce, but cannot eliminate, the chance for an individual to have a child with CF.  If an individual is found to be a carrier for CF, then carrier testing would be available for the partner. As part of [place] newborn screening profile, all babies born in the state of [place] will have a two-tier screening process.  Specimens are first tested to determine the concentration of immunoreactive trypsinogen (IRT).  The top 5% of specimens with the highest IRT values then undergo DNA testing using a panel of over 40 common CF mutations. SMA is a neurodegenerative disorder that leads to atrophy of skeletal muscle and overall weakness.  This condition is also more prevalent in the Caucasian population, with 1 in 40-1 in 60 persons being a carrier and 1 in 6,000-1 in 10,000 children being affected.  There are multiple forms of the disease, with some causing death in infancy to other forms with survival into adulthood.  The genetics of SMA is complex, but carrier screening can detect up to 95% of carriers in the Caucasian population.  Similar to CF, a negative result can greatly reduce, but cannot eliminate, the chance to have a child with SMA. The  patient is of African American background, and prior testing for hemoglobinopathies was performed at [facility] and was normal.  We reviewed the detailed family history and pregnancy history obtained previously.  Ashley Howell reported no changes in the family other than the birth of her daughter who is in good health.  The remainder of the family history was reported to be unremarkable for birth defects, intellectual delays, recurrent pregnancy loss or known chromosome abnormalities.  Ashley Howell stated that this is her fifth pregnancy.  She has three healthy daughters, ages 57, 66 and 2.  The youngest and the current pregnancy are with her current partner, Ashley Howell.  She reported no complications or exposures in this pregnancy that would be expected to increase the risk for birth defects.  She is currently taking a baby aspirin per day.  The patient stated that she has a history of a heart condition, which she was seen for in 2016 by cardiology.  A review of records suggested a possible ASD and possible PFO for the patient.  Dr. Lehman Prom reviewed those records today and recommends a referral back to cardiology for the patient (which was recommended previous but was not performed).  We also recommend a fetal echocardiogram after [redacted] weeks gestation in this pregnancy, as there may  be inherited components to structural heart conditions.  After consideration of the options, Ashley Howell elected to proceed with first trimester screening.  She declined CF and SMA carrier screening.  An ultrasound was performed at the time of the visit.  The gestational age was consistent with 12 weeks.  Fetal anatomy could not be assessed due to early gestational age.  Please refer to the ultrasound report for details of that study.  Ashley Howell was encouraged to call with questions or concerns.  We can be contacted at [phone].  Labs ordered:  First trimester screening  Wilburt Finlay, MS, CGC  I met the patient and  reviewed the counseling .  First tri screen was done I recommended detailed anatomy for elevated BMI and a fetal echo around 22 weeks due to maternal h/o ASD versus PFO Additionally , I recommended follow up meternal echo to assess cardiac function and to r/o pulmonary HTN.  Gatha Mayer, MD

## 2018-04-14 ENCOUNTER — Telehealth: Payer: Self-pay | Admitting: Obstetrics and Gynecology

## 2018-04-14 NOTE — Telephone Encounter (Signed)
  Ashley Howell elected to undergo First Trimester screening on 04/07/2018.  To review, first trimester screening, includes nuchal translucency ultrasound screen and/or first trimester maternal serum marker screening.  The nuchal translucency has approximately an 80% detection rate for Down syndrome and can be positive for other chromosome abnormalities as well as heart defects.  When combined with a maternal serum marker screening, the detection rate is up to 90% for Down syndrome and up to 97% for trisomy 13 and 18.     The results of the First Trimester Nuchal Translucency and Biochemical Screening were within normal range.  The risk for Down syndrome is now estimated to be less than 1 in 10,000.  The risk for Trisomy 13/18 is also estimated to be less than 1 in 10,000.  Should more definitive information be desired, we would offer amniocentesis.  Because we do not yet know the effectiveness of combined first and second trimester screening, we do not recommend a maternal serum screen to assess the chance for chromosome conditions.  However, if screening for neural tube defects is desired, maternal serum screening for AFP only can be performed between 15 and [redacted] weeks gestation.      Ashley Anderson, MS, CGC

## 2018-04-23 ENCOUNTER — Emergency Department
Admission: EM | Admit: 2018-04-23 | Discharge: 2018-04-23 | Disposition: A | Discharge status: Home / Self Care | Payer: Medicaid Other | Attending: Emergency Medicine | Admitting: Emergency Medicine

## 2018-04-23 ENCOUNTER — Other Ambulatory Visit: Payer: Self-pay

## 2018-04-23 ENCOUNTER — Emergency Department: Payer: Medicaid Other

## 2018-04-23 ENCOUNTER — Encounter: Payer: Self-pay | Admitting: Emergency Medicine

## 2018-04-23 DIAGNOSIS — R0602 Shortness of breath: Secondary | ICD-10-CM | POA: Diagnosis present

## 2018-04-23 DIAGNOSIS — R0600 Dyspnea, unspecified: Secondary | ICD-10-CM

## 2018-04-23 DIAGNOSIS — F17210 Nicotine dependence, cigarettes, uncomplicated: Secondary | ICD-10-CM | POA: Diagnosis not present

## 2018-04-23 DIAGNOSIS — Z7982 Long term (current) use of aspirin: Secondary | ICD-10-CM | POA: Insufficient documentation

## 2018-04-23 DIAGNOSIS — R079 Chest pain, unspecified: Secondary | ICD-10-CM | POA: Diagnosis not present

## 2018-04-23 DIAGNOSIS — O9989 Other specified diseases and conditions complicating pregnancy, childbirth and the puerperium: Secondary | ICD-10-CM | POA: Diagnosis not present

## 2018-04-23 DIAGNOSIS — Z3A15 15 weeks gestation of pregnancy: Secondary | ICD-10-CM | POA: Insufficient documentation

## 2018-04-23 DIAGNOSIS — Z79899 Other long term (current) drug therapy: Secondary | ICD-10-CM | POA: Diagnosis not present

## 2018-04-23 LAB — CBC
HEMATOCRIT: 38.9 % (ref 36.0–46.0)
Hemoglobin: 12.6 g/dL (ref 12.0–15.0)
MCH: 26.9 pg (ref 26.0–34.0)
MCHC: 32.4 g/dL (ref 30.0–36.0)
MCV: 83.1 fL (ref 80.0–100.0)
Platelets: 338 10*3/uL (ref 150–400)
RBC: 4.68 MIL/uL (ref 3.87–5.11)
RDW: 13.2 % (ref 11.5–15.5)
WBC: 11.2 10*3/uL — ABNORMAL HIGH (ref 4.0–10.5)
nRBC: 0 % (ref 0.0–0.2)

## 2018-04-23 LAB — BASIC METABOLIC PANEL
Anion gap: 9 (ref 5–15)
BUN: 7 mg/dL (ref 6–20)
CO2: 19 mmol/L — ABNORMAL LOW (ref 22–32)
Calcium: 8.9 mg/dL (ref 8.9–10.3)
Chloride: 107 mmol/L (ref 98–111)
Creatinine, Ser: 0.53 mg/dL (ref 0.44–1.00)
GFR calc Af Amer: >60 mL/min (ref >60)
GFR calc non Af Amer: >60 mL/min (ref >60)
GLUCOSE: 110 mg/dL — AB (ref 70–99)
Potassium: 3.3 mmol/L — ABNORMAL LOW (ref 3.5–5.1)
Sodium: 135 mmol/L (ref 135–145)

## 2018-04-23 LAB — TROPONIN I: Troponin I: <0.03 ng/mL (ref <0.03)

## 2018-04-23 MED ORDER — IOPAMIDOL (ISOVUE-370) INJECTION 76%
100.0000 mL | Freq: Once | INTRAVENOUS | Status: DC | PRN
  Filled 2018-04-23: qty 100

## 2018-04-23 MED ORDER — IOHEXOL 350 MG/ML SOLN
100.0000 mL | Freq: Once | INTRAVENOUS | Status: AC | PRN
  Administered 2018-04-23: 100 mL via INTRAVENOUS
  Filled 2018-04-23: qty 100

## 2018-04-23 NOTE — ED Triage Notes (Signed)
FIRST NURSE NOTE-pt reports heart problem and c/o chest pain. Is pregnant. Pulled for EKG

## 2018-04-23 NOTE — ED Triage Notes (Signed)
Pt here for chest pain and SHOB.  Pt is [redacted] weeks pregnant.  Reports has ASD that has not closed.  Unlabored. NAD. Color WNL.

## 2018-04-23 NOTE — ED Provider Notes (Signed)
[facility] Emergency Department Provider Note  Time seen: 6:06 PM  I have reviewed the triage vital signs and the nursing notes.   HISTORY  Chief Complaint Chest Pain    HPI Ashley JunesSamantha S Howell is a 28 y.o. 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  According to the patient over the past 2 to 3 days she has been experiencing chest pain shortness of breath.  States the pain is become worse today, states it is worse with deep inspiration.  Denies any leg pain or swelling.  No history of DVT.  No current hormone use.  States that shortness of breath is only with exertion, denies any shortness of breath lying in bed.   Past Medical History:  Diagnosis Date  . Anxiety   . GERD (gastroesophageal reflux disease)   . Heart defect    ASD  . Shortness of breath dyspnea     Patient Active Problem List   Diagnosis Date Noted  . S/P cesarean section 02/22/2015  . Obesity in pregnancy, antepartum 02/15/2015  . Previous cesarean delivery, antepartum condition or complication 02/08/2015  . Atrial septal defect 01/13/2015  . Cramping affecting pregnancy, antepartum 12/08/2014  . Obesity affecting pregnancy 10/28/2014  . History of cardiomegaly 10/28/2014  . First trimester screening     Past Surgical History:  Procedure Laterality Date  . CESAREAN SECTION  10/28/2008  . CESAREAN SECTION N/A 02/22/2015   Procedure: CESAREAN SECTION;  Surgeon: Nadara Mustardobert P Harris, MD;  Location: [facility] ORS;  Service: Obstetrics;  Laterality: N/A;  . CESAREAN SECTION  04/13/2010    Prior to Admission medications   Medication Sig Start Date End Date Taking? Authorizing Provider  aspirin 81 MG chewable tablet Chew 81 mg by mouth daily.    [provider]  ondansetron (ZOFRAN ODT) 4 MG disintegrating tablet Take 1 tablet (4 mg total) by mouth every 8  (eight) hours as needed for nausea or vomiting. 08/31/17   Minna AntisPaduchowski, Avionna Bower, MD  Prenatal Vit-Fe Fumarate-FA (PRENATAL MULTIVITAMIN) TABS tablet Take 1 tablet by mouth daily at 12 noon.    [provider]    No Known Allergies  Family History  Problem Relation Age of Onset  . Hypertension Mother   . Stroke Paternal Aunt   . Cancer Maternal Grandfather     Social History Social History   Tobacco Use  . Smoking status: Current Every Day Smoker    Packs/day: 0.25    Types: Cigarettes    Last attempt to quit: 05/23/2014    Years since quitting: 3.9  . Smokeless tobacco: Never Used  Substance Use Topics  . Alcohol use: No  . Drug use: No    Review of Systems Constitutional: Negative for fever. Cardiovascular: Describes a chest pain/tightness across the center of her chest, worse with deep inspiration. Respiratory: Mild shortness of breath, worse with exertion Gastrointestinal: Negative for abdominal pain, vomiting  Genitourinary: Negative for urinary compaints Musculoskeletal: Negative for musculoskeletal complaints Neurological: Negative for headache All other ROS negative  ____________________________________________   PHYSICAL EXAM:  VITAL SIGNS: ED Triage Vitals  Enc Vitals Group     BP 04/23/18 1722 (!) 141/75     Pulse Rate 04/23/18 1722 (!) 102     Resp --      Temp 04/23/18 1722 98.1 F (36.7 C)     Temp Source 04/23/18 1722 Oral  SpO2 04/23/18 1722 99 %     Weight 04/23/18 1722 (!) 303 lb (137.4 kg)     Height 04/23/18 1722 5\' 7"  (1.702 m)     Head Circumference --      Peak Flow --      Pain Score 04/23/18 1739 6     Pain Loc --      Pain Edu? --      Excl. in GC? --    Constitutional: Alert and oriented. Well appearing and in no distress. Eyes: Normal exam ENT   Head: Normocephalic and atraumatic.   Mouth/Throat: Mucous membranes are moist. Cardiovascular: Normal rate, regular rhythm.  Respiratory: Normal respiratory effort  without tachypnea nor retractions. Breath sounds are clear  Gastrointestinal: Soft and nontender. No distention.   Musculoskeletal: Nontender with normal range of motion in all extremities.  No lower extremity tenderness or edema. Neurologic:  Normal speech and language. No gross focal neurologic deficits are appreciated. Skin:  Skin is warm, dry and intact.  Psychiatric: Mood and affect are normal. Speech and behavior are normal.   ____________________________________________    EKG  EKG viewed and interpreted by myself shows normal sinus rhythm at 100 bpm with a narrow QRS, normal axis.  Patient does have mild inferolateral ST changes.  Nonspecific.  ____________________________________________    RADIOLOGY  CTA negative for PE  ____________________________________________   INITIAL IMPRESSION / ASSESSMENT AND PLAN / ED COURSE  Pertinent labs & imaging results that were available during my care of the patient were reviewed by me and considered in my medical decision making (see chart for details).  Patient presents emergency department for chest discomfort and shortness of breath worsening over the past 2 to 3 days.  States the chest pain is somewhat worse with deep inspiration.  Differential would include chest wall pain, ACS, pulmonary embolism, cardiomyopathy, valvular disease.  We will check labs including cardiac enzymes.  Given the patient's complaint of pleuritic pain as well as mild EKG abnormality we will proceed with CT angiography of the chest to rule out pulmonary embolism.  I discussed the pros and cons with the patient she is agreeable to plan of care with abdominal shielding.  CTA is negative for PE.  Patient's lab work is largely reassuring including a negative troponin.  We will have the patient follow-up with her primary care doctor.  Patient agreeable plan of care.  ____________________________________________   FINAL CLINICAL IMPRESSION(S) / ED  DIAGNOSES  Chest pain Dyspnea   Minna Antis, MD 04/23/18 1851

## 2018-04-23 NOTE — ED Notes (Signed)
Report called to Marchelle Folks, Charity fundraiser.  Called to [facility].  [phone]

## 2018-04-23 NOTE — ED Notes (Signed)
Patient transported to CT 

## 2018-05-15 ENCOUNTER — Other Ambulatory Visit: Payer: Self-pay

## 2018-05-16 ENCOUNTER — Other Ambulatory Visit: Payer: Self-pay

## 2018-05-16 ENCOUNTER — Emergency Department
Admission: EM | Admit: 2018-05-16 | Discharge: 2018-05-16 | Disposition: A | Discharge status: Home / Self Care | Payer: Medicaid Other | Attending: Emergency Medicine | Admitting: Emergency Medicine

## 2018-05-16 DIAGNOSIS — O26899 Other specified pregnancy related conditions, unspecified trimester: Secondary | ICD-10-CM

## 2018-05-16 DIAGNOSIS — Z3A18 18 weeks gestation of pregnancy: Secondary | ICD-10-CM | POA: Diagnosis not present

## 2018-05-16 DIAGNOSIS — Z7982 Long term (current) use of aspirin: Secondary | ICD-10-CM | POA: Diagnosis not present

## 2018-05-16 DIAGNOSIS — R519 Headache, unspecified: Secondary | ICD-10-CM

## 2018-05-16 DIAGNOSIS — R109 Unspecified abdominal pain: Secondary | ICD-10-CM | POA: Diagnosis not present

## 2018-05-16 DIAGNOSIS — Z79899 Other long term (current) drug therapy: Secondary | ICD-10-CM | POA: Diagnosis not present

## 2018-05-16 DIAGNOSIS — O26892 Other specified pregnancy related conditions, second trimester: Secondary | ICD-10-CM | POA: Insufficient documentation

## 2018-05-16 DIAGNOSIS — F17210 Nicotine dependence, cigarettes, uncomplicated: Secondary | ICD-10-CM | POA: Diagnosis not present

## 2018-05-16 DIAGNOSIS — R51 Headache: Secondary | ICD-10-CM | POA: Insufficient documentation

## 2018-05-16 DIAGNOSIS — O99332 Smoking (tobacco) complicating pregnancy, second trimester: Secondary | ICD-10-CM | POA: Diagnosis not present

## 2018-05-16 LAB — URINALYSIS, COMPLETE (UACMP) WITH MICROSCOPIC
Bilirubin Urine: NEGATIVE
Glucose, UA: NEGATIVE mg/dL
Hgb urine dipstick: NEGATIVE
Ketones, ur: NEGATIVE mg/dL
Leukocytes, UA: NEGATIVE
Nitrite: NEGATIVE
Protein, ur: NEGATIVE mg/dL
Specific Gravity, Urine: 1.026 (ref 1.005–1.030)
pH: 5 (ref 5.0–8.0)

## 2018-05-16 LAB — COMPREHENSIVE METABOLIC PANEL
ALT: 14 U/L (ref 0–44)
AST: 16 U/L (ref 15–41)
Albumin: 3.6 g/dL (ref 3.5–5.0)
Alkaline Phosphatase: 56 U/L (ref 38–126)
Anion gap: 9 (ref 5–15)
BUN: 7 mg/dL (ref 6–20)
CO2: 22 mmol/L (ref 22–32)
Calcium: 9.2 mg/dL (ref 8.9–10.3)
Chloride: 105 mmol/L (ref 98–111)
Creatinine, Ser: 0.57 mg/dL (ref 0.44–1.00)
GFR calc Af Amer: >60 mL/min (ref >60)
GFR calc non Af Amer: >60 mL/min (ref >60)
Glucose, Bld: 102 mg/dL — ABNORMAL HIGH (ref 70–99)
Potassium: 3.6 mmol/L (ref 3.5–5.1)
SODIUM: 136 mmol/L (ref 135–145)
Total Bilirubin: 0.4 mg/dL (ref 0.3–1.2)
Total Protein: 7.4 g/dL (ref 6.5–8.1)

## 2018-05-16 LAB — CBC
HCT: 39.7 % (ref 36.0–46.0)
HEMOGLOBIN: 13.2 g/dL (ref 12.0–15.0)
MCH: 27.2 pg (ref 26.0–34.0)
MCHC: 33.2 g/dL (ref 30.0–36.0)
MCV: 81.9 fL (ref 80.0–100.0)
Platelets: 357 10*3/uL (ref 150–400)
RBC: 4.85 MIL/uL (ref 3.87–5.11)
RDW: 13 % (ref 11.5–15.5)
WBC: 12.4 10*3/uL — ABNORMAL HIGH (ref 4.0–10.5)
nRBC: 0 % (ref 0.0–0.2)

## 2018-05-16 LAB — PROTEIN / CREATININE RATIO, URINE
Creatinine, Urine: 228 mg/dL
Protein Creatinine Ratio: 0.07 mg/mg{Cre} (ref 0.00–0.15)
Total Protein, Urine: 16 mg/dL

## 2018-05-16 LAB — HCG, QUANTITATIVE, PREGNANCY: hCG, Beta Chain, Quant, S: 13462 m[IU]/mL — ABNORMAL HIGH (ref <5)

## 2018-05-16 MED ORDER — ACETAMINOPHEN 500 MG PO TABS
1000.0000 mg | ORAL_TABLET | Freq: Once | ORAL | Status: AC
  Administered 2018-05-16: 1000 mg via ORAL
  Filled 2018-05-16: qty 2

## 2018-05-16 NOTE — ED Notes (Signed)
Pt c/o headache across forehead for a "couple of days" as well as cramping pain across lower abdomen and lower central back pain

## 2018-05-16 NOTE — ED Triage Notes (Signed)
Pt states she is [redacted] weeks pregnant with headache, foamy urine, generalized abd pain and back pain. Pt denies known fever and denies vaginal bleeding.

## 2018-05-16 NOTE — ED Provider Notes (Signed)
[facility] Emergency Department Provider Note  ____________________________________________  Time seen: Approximately 8:45 PM  I have reviewed the triage vital signs and the nursing notes.   HISTORY  Chief Complaint Headache and Abdominal Pain   HPI Ashley JunesSamantha S Howell is a 28 y.o. female with a history of ASD, GERD, anxiety, currently [redacted] weeks pregnant who presents for evaluation of several medical complaints.  Patient is complaining of headache which is frontal, moderate, throbbing, constant for a few days.  She denies history of migraine headaches.  She denies thunderclap headache.  She is also complaining of intermittent diffuse cramping lower abdominal pain radiating to her back.  No vaginal bleeding, vaginal discharge, fluid loss, dysuria, hematuria, nausea or vomiting.  She has had normal appetite.  She is feeling the baby move.  She is also complaining of seeing bubbles in her urine.  She saw her OB/GYN yesterday and was diagnosed with an yeast infection.  Remaining of her STD screening was negative.  She was noted to have an elevated blood pressure and was instructed to return in a week for reevaluation.  She denies any history of preeclampsia with prior pregnancies.  She denies changes in vision.  Past Medical History:  Diagnosis Date  . Anxiety   . GERD (gastroesophageal reflux disease)   . Heart defect    ASD  . Shortness of breath dyspnea     Patient Active Problem List   Diagnosis Date Noted  . S/P cesarean section 02/22/2015  . Obesity in pregnancy, antepartum 02/15/2015  . Previous cesarean delivery, antepartum condition or complication 02/08/2015  . Atrial septal defect 01/13/2015  . Cramping affecting pregnancy, antepartum 12/08/2014  . Obesity affecting pregnancy 10/28/2014  . History of cardiomegaly 10/28/2014  . First trimester screening     Past Surgical History:  Procedure Laterality Date  . CESAREAN SECTION  10/28/2008  .  CESAREAN SECTION N/A 02/22/2015   Procedure: CESAREAN SECTION;  Surgeon: Nadara Mustardobert P Harris, MD;  Location: [facility] ORS;  Service: Obstetrics;  Laterality: N/A;  . CESAREAN SECTION  04/13/2010    Prior to Admission medications   Medication Sig Start Date End Date Taking? Authorizing Provider  aspirin 81 MG chewable tablet Chew 81 mg by mouth daily.    [provider]  ondansetron (ZOFRAN ODT) 4 MG disintegrating tablet Take 1 tablet (4 mg total) by mouth every 8 (eight) hours as needed for nausea or vomiting. 08/31/17   Minna AntisPaduchowski, Kevin, MD  Prenatal Vit-Fe Fumarate-FA (PRENATAL MULTIVITAMIN) TABS tablet Take 1 tablet by mouth daily at 12 noon.    [provider]    Allergies Patient has no known allergies.  Family History  Problem Relation Age of Onset  . Hypertension Mother   . Stroke Paternal Aunt   . Cancer Maternal Grandfather     Social History Social History   Tobacco Use  . Smoking status: Current Every Day Smoker    Packs/day: 0.25    Types: Cigarettes    Last attempt to quit: 05/23/2014    Years since quitting: 3.9  . Smokeless tobacco: Never Used  Substance Use Topics  . Alcohol use: No  . Drug use: No    Review of Systems  Constitutional: Negative for fever. Eyes: Negative for visual changes. ENT: Negative for sore throat. Neck: No neck pain  Cardiovascular: Negative for chest pain. Respiratory: Negative for shortness of breath. Gastrointestinal: +abdominal pain. No vomiting or diarrhea. Genitourinary: Negative for dysuria. + bubbles in urine Musculoskeletal: Negative  for back pain. Skin: Negative for rash. Neurological: Negative for weakness or numbness. + HA Psych: No SI or HI  ____________________________________________   PHYSICAL EXAM:  VITAL SIGNS: ED Triage Vitals [05/16/18 1858]  Enc Vitals Group     BP (!) 152/88     Pulse Rate 99     Resp 16     Temp 98.5 F (36.9 C)     Temp Source Oral     SpO2 100 %     Weight (!)  303 lb (137.4 kg)     Height 5\' 7"  (1.702 m)     Head Circumference      Peak Flow      Pain Score 9     Pain Loc      Pain Edu?      Excl. in GC?     Constitutional: Alert and oriented. Well appearing and in no apparent distress. HEENT:      Head: Normocephalic and atraumatic.         Eyes: Conjunctivae are normal. Sclera is non-icteric.       Mouth/Throat: Mucous membranes are moist.       Neck: Supple with no signs of meningismus. Cardiovascular: Regular rate and rhythm. No murmurs, gallops, or rubs. 2+ symmetrical distal pulses are present in all extremities. No JVD. Respiratory: Normal respiratory effort. Lungs are clear to auscultation bilaterally. No wheezes, crackles, or rhonchi.  Gastrointestinal: Gravid, non tender, and non distended with positive bowel sounds. No rebound or guarding. Musculoskeletal: Nontender with normal range of motion in all extremities. No edema, cyanosis, or erythema of extremities. Neurologic: Normal speech and language. Face is symmetric. Moving all extremities. No gross focal neurologic deficits are appreciated. Skin: Skin is warm, dry and intact. No rash noted. Psychiatric: Mood and affect are normal. Speech and behavior are normal.  ____________________________________________   LABS (all labs ordered are listed, but only abnormal results are displayed)  Labs Reviewed  COMPREHENSIVE METABOLIC PANEL - Abnormal; Notable for the following components:      Result Value   Glucose, Bld 102 (*)    All other components within normal limits  CBC - Abnormal; Notable for the following components:   WBC 12.4 (*)    All other components within normal limits  URINALYSIS, COMPLETE (UACMP) WITH MICROSCOPIC - Abnormal; Notable for the following components:   Color, Urine YELLOW (*)    APPearance HAZY (*)    Bacteria, UA RARE (*)    All other components within normal limits  HCG, QUANTITATIVE, PREGNANCY - Abnormal; Notable for the following components:    hCG, Beta Chain, Quant, S 13,462 (*)    All other components within normal limits  PROTEIN / CREATININE RATIO, URINE   ____________________________________________  EKG  none  ____________________________________________  RADIOLOGY  none  ____________________________________________   PROCEDURES  Procedure(s) performed: None Procedures Critical Care performed:  None ____________________________________________   INITIAL IMPRESSION / ASSESSMENT AND PLAN / ED COURSE  28 y.o. female with a history of ASD, GERD, anxiety, currently [redacted] weeks pregnant who presents for evaluation of several medical complaints.   # abdominal cramping: Abdomen is gravid, soft, no tenderness throughout, normal bowel sounds.  UA negative for UTI. CMP WNL, mildly elevated WBC most likely due to pregnancy. [facility] No right sided tenderness concerning for appendicitis. No signs of miscarriage at this time. Possibly round ligament pain  # HA: moderate, no thunderclap. Patient with elevated BP which is being monitored by her  OB. Not on meds at this time. No signs of pre-eclampsia, HELLP syndrome, or proteinuria otherwise. Will treat with tylenol and recommend close f/u with OB for BP monitoring  # bubbles in urine: diagnosed with yeast yesterday. Being treated. UA negative for UTI. No protein.   Clinical Course as of May 16 2228  Fri May 16, 2018  2142 After patient's headache resolved with Tylenol blood pressure is now normal at 120/66.  Bedside ultrasound showing great fetal movement with fetal heart rate of 147.  Labs showing no acute findings.  At this time will discharge home on supportive care and close follow-up with OB/GYN.  Discussed standard return precautions.   [CV]    Clinical Course User Index [CV] Don Perking [place], MD     As part of my medical decision making, I reviewed the following data within the electronic MEDICAL RECORD NUMBER Nursing notes reviewed and  incorporated, Labs reviewed , Old chart reviewed, Notes from prior ED visits and [place] Controlled Substance Database    Pertinent labs & imaging results that were available during my care of the patient were reviewed by me and considered in my medical decision making (see chart for details).    ____________________________________________   FINAL CLINICAL IMPRESSION(S) / ED DIAGNOSES  Final diagnoses:  Acute nonintractable headache, unspecified headache type  Abdominal cramping affecting pregnancy      NEW MEDICATIONS STARTED DURING THIS VISIT:  ED Discharge Orders    None       Note:  This document was prepared using Dragon voice recognition software and may include unintentional dictation errors.    Don Perking, [place], MD 05/16/18 2230

## 2018-05-16 NOTE — Discharge Instructions (Addendum)

## 2018-05-19 ENCOUNTER — Ambulatory Visit (HOSPITAL_BASED_OUTPATIENT_CLINIC_OR_DEPARTMENT_OTHER)
Admission: RE | Admit: 2018-05-19 | Discharge: 2018-05-19 | Disposition: A | Discharge status: Home / Self Care | Payer: Medicaid Other | Source: Ambulatory Visit | Attending: Obstetrics and Gynecology | Admitting: Obstetrics and Gynecology

## 2018-05-19 ENCOUNTER — Ambulatory Visit
Admission: RE | Admit: 2018-05-19 | Discharge: 2018-05-19 | Disposition: A | Discharge status: Home / Self Care | Payer: Medicaid Other | Source: Ambulatory Visit | Attending: Obstetrics and Gynecology | Admitting: Obstetrics and Gynecology

## 2018-05-19 VITALS — BP 143/93 | HR 100 | Temp 98.1°F | Wt 303.0 lb

## 2018-05-19 DIAGNOSIS — Q211 Atrial septal defect, unspecified: Secondary | ICD-10-CM

## 2018-05-19 DIAGNOSIS — O169 Unspecified maternal hypertension, unspecified trimester: Secondary | ICD-10-CM

## 2018-05-19 DIAGNOSIS — O99212 Obesity complicating pregnancy, second trimester: Secondary | ICD-10-CM | POA: Insufficient documentation

## 2018-05-19 DIAGNOSIS — O99210 Obesity complicating pregnancy, unspecified trimester: Secondary | ICD-10-CM

## 2018-05-19 DIAGNOSIS — F17210 Nicotine dependence, cigarettes, uncomplicated: Secondary | ICD-10-CM | POA: Diagnosis not present

## 2018-05-19 DIAGNOSIS — O162 Unspecified maternal hypertension, second trimester: Secondary | ICD-10-CM | POA: Diagnosis not present

## 2018-05-19 DIAGNOSIS — Z369 Encounter for antenatal screening, unspecified: Secondary | ICD-10-CM | POA: Diagnosis present

## 2018-05-19 DIAGNOSIS — Z3A18 18 weeks gestation of pregnancy: Secondary | ICD-10-CM | POA: Diagnosis not present

## 2018-05-19 DIAGNOSIS — Z98891 History of uterine scar from previous surgery: Secondary | ICD-10-CM

## 2018-05-19 DIAGNOSIS — Z7982 Long term (current) use of aspirin: Secondary | ICD-10-CM | POA: Insufficient documentation

## 2018-05-19 DIAGNOSIS — O10019 Pre-existing essential hypertension complicating pregnancy, unspecified trimester: Secondary | ICD-10-CM

## 2018-05-19 DIAGNOSIS — O34219 Maternal care for unspecified type scar from previous cesarean delivery: Secondary | ICD-10-CM

## 2018-05-19 DIAGNOSIS — O26892 Other specified pregnancy related conditions, second trimester: Secondary | ICD-10-CM | POA: Insufficient documentation

## 2018-05-19 DIAGNOSIS — Z8249 Family history of ischemic heart disease and other diseases of the circulatory system: Secondary | ICD-10-CM | POA: Diagnosis not present

## 2018-05-19 DIAGNOSIS — Z8679 Personal history of other diseases of the circulatory system: Secondary | ICD-10-CM

## 2018-05-19 DIAGNOSIS — I10 Essential (primary) hypertension: Secondary | ICD-10-CM | POA: Insufficient documentation

## 2018-05-19 HISTORY — DX: Pre-existing essential hypertension complicating pregnancy, unspecified trimester: O10.019

## 2018-05-19 MED ORDER — LABETALOL HCL 100 MG PO TABS: 100.0000 mg | ORAL_TABLET | Freq: Two times a day (BID) | ORAL | 3 refills | Status: DC

## 2018-05-19 NOTE — Progress Notes (Signed)
[facility] Consultation   Chief Complaint: Obesity and hypertension in pregnancy   HPI: [name] is a 28 y.o. [phone] at [redacted]w[redacted]d  who presents in consultation from [facility]   for elevated BMI and recent hypertension in pregnancy  Patietn states she has been watching what she eats and has not gained weight. She has decreased her smoking and is smoking 2 cigs every other day. She can't take the step to stop for good. She had an episode of elevated BP last weeks at [facility] - she went to the ER and her BP came down spontaneously. Normal labs there  . Pt has a h/o PFO , she was seen by Dr Georga Hacking Ward at [facility] for her 2016 pregnancy and a bubble study postnatally was recommended but was not done.    Past Medical History: Patient  has a past medical history of Anxiety, GERD (gastroesophageal reflux disease), Heart defect, and Shortness of breath dyspnea.  Past Surgical History: She  has a past surgical history that includes Cesarean section (10/28/2008); Cesarean section (N/A, 02/22/2015); and Cesarean section (04/13/2010).  Obstetric History:  OB History    Gravida  5   Para  3   Term  3   Preterm      AB  1   Living  3     SAB      TAB  1   Ectopic      Multiple  0   Live Births  3          Gynecologic History:  Patient's last menstrual period was 01/17/2018 (lmp unknown).    Medications: Current Outpatient Medications:  .  acetaminophen (TYLENOL) 325 MG tablet, Take 650 mg by mouth every 6 (six) hours as needed., Disp: , Rfl:  .  aspirin 81 MG chewable tablet, Chew 81 mg by mouth daily., Disp: , Rfl:  .  labetalol (NORMODYNE) 100 MG tablet, Take 1 tablet (100 mg total) by mouth 2 (two) times daily., Disp: 30 tablet, Rfl: 3 .  Prenatal Vit-Fe Fumarate-FA (PRENATAL MULTIVITAMIN) TABS tablet, Take 1 tablet by mouth daily at 12 noon., Disp: , Rfl:   Allergies: Patient has No Known Allergies.  Social History: Patient  reports that she has been  smoking cigarettes. She has been smoking about 0.25 packs per day. She has never used smokeless tobacco. She reports that she does not drink alcohol or use drugs.  Family History: family history includes Cancer in her maternal grandfather; Hypertension in her mother; Stroke in her paternal aunt.  Review of Systems A full 12 point review of systems was negative or as noted in the History of Present Illness.  Physical Exam: LMP 01/17/2018 (LMP Unknown)  Today's Vitals   05/19/18 1418 05/19/18 1500  BP: (!) 166/99 (!) 143/93  Pulse: (!) 111 100  Temp: 98.1 F (36.7 C)   SpO2: 98% 97%  Weight: (!) 137.4 kg    Body mass index is 47.46 kg/m.;  Cardiac images limited  Otherwise Normal anatomy scan - f/u ordered  Neg early glucola  Asessement: 1. S/P cesarean section   2. Previous cesarean delivery, antepartum condition or complication   3. Obesity in pregnancy, antepartum   4. History of cardiomegaly   5. Atrial septal defect    6 hypertension - elevated BP today - normal CMP in ER  Plan: Start labetalol 100 bid sent in to [facility] in [place]  Continue baby aspirin for preeclampsia prevention  Check urine P/C ratio -  pt could not void again today  Pt has a BP cuff - I suggested checking at home daily  RTC 2 weeks for BP check and med adjustment  Keep appointment for maternal echo  Fetal echo appointment also     Total time spent with the patient was 30 minutes with greater than 50% spent in counseling and coordination of care. We appreciate this interesting consult and will be happy to be involved in the ongoing care of Ms. Giddens in anyway her obstetricians desire.  Jimmey Ralph Maternal-Fetal Medicine [facility]

## 2018-05-26 ENCOUNTER — Encounter: Payer: Self-pay | Admitting: Advanced Practice Midwife

## 2018-05-26 ENCOUNTER — Ambulatory Visit (INDEPENDENT_AMBULATORY_CARE_PROVIDER_SITE_OTHER): Payer: Medicaid Other | Admitting: Advanced Practice Midwife

## 2018-05-26 VITALS — BP 146/76 | Wt 305.0 lb

## 2018-05-26 DIAGNOSIS — O0990 Supervision of high risk pregnancy, unspecified, unspecified trimester: Secondary | ICD-10-CM | POA: Insufficient documentation

## 2018-05-26 DIAGNOSIS — Z3A19 19 weeks gestation of pregnancy: Secondary | ICD-10-CM

## 2018-05-26 LAB — POCT URINALYSIS DIPSTICK OB
Glucose, UA: NEGATIVE
POC,PROTEIN,UA: NEGATIVE

## 2018-05-26 NOTE — Progress Notes (Signed)
NOB Transfer from [facility] 

## 2018-05-27 ENCOUNTER — Emergency Department
Admission: EM | Admit: 2018-05-27 | Discharge: 2018-05-27 | Disposition: A | Discharge status: Home / Self Care | Payer: Medicaid Other | Attending: Emergency Medicine | Admitting: Emergency Medicine

## 2018-05-27 ENCOUNTER — Other Ambulatory Visit: Payer: Self-pay | Admitting: Obstetrics and Gynecology

## 2018-05-27 ENCOUNTER — Encounter: Payer: Self-pay | Admitting: Emergency Medicine

## 2018-05-27 ENCOUNTER — Other Ambulatory Visit: Payer: Self-pay

## 2018-05-27 DIAGNOSIS — Z7982 Long term (current) use of aspirin: Secondary | ICD-10-CM | POA: Insufficient documentation

## 2018-05-27 DIAGNOSIS — F419 Anxiety disorder, unspecified: Secondary | ICD-10-CM | POA: Insufficient documentation

## 2018-05-27 DIAGNOSIS — F17210 Nicotine dependence, cigarettes, uncomplicated: Secondary | ICD-10-CM | POA: Diagnosis not present

## 2018-05-27 DIAGNOSIS — R51 Headache: Secondary | ICD-10-CM | POA: Diagnosis not present

## 2018-05-27 DIAGNOSIS — R519 Headache, unspecified: Secondary | ICD-10-CM

## 2018-05-27 MED ORDER — DIPHENHYDRAMINE HCL 25 MG PO CAPS
25.0000 mg | ORAL_CAPSULE | Freq: Once | ORAL | Status: AC
  Administered 2018-05-27: 25 mg via ORAL
  Filled 2018-05-27: qty 1

## 2018-05-27 MED ORDER — ACETAMINOPHEN 500 MG PO TABS
1000.0000 mg | ORAL_TABLET | Freq: Once | ORAL | Status: AC
  Administered 2018-05-27: 1000 mg via ORAL
  Filled 2018-05-27: qty 2

## 2018-05-27 MED ORDER — METOCLOPRAMIDE HCL 10 MG PO TABS
10.0000 mg | ORAL_TABLET | Freq: Once | ORAL | Status: AC
  Administered 2018-05-27: 10 mg via ORAL
  Filled 2018-05-27: qty 1
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
## 2018-05-27 NOTE — ED Provider Notes (Signed)
[facility] Emergency Department Provider Note   ____________________________________________    I have reviewed the triage vital signs and the nursing notes.   HISTORY  Chief Complaint Headache     HPI Ashley Howell is a 28 y.o. female who presents with complaints of mild global throbbing headache.  Patient is [redacted] weeks pregnant, no history of preeclampsia, was followed by her gynecologist for elevated blood pressure, seen in the emergency department 2 weeks ago for similar complaints resolved with Tylenol.  Patient describes throbbing headache which is described above is global and mild to moderate.  No neuro deficits.  No nausea or vomiting.  No fevers.  No neck pain.  Reports she last took Tylenol 6 hours ago  Past Medical History:  Diagnosis Date  . Anxiety   . GERD (gastroesophageal reflux disease)   . Heart defect    ASD  . Shortness of breath dyspnea     Patient Active Problem List   Diagnosis Date Noted  . Supervision of high risk pregnancy, antepartum 05/26/2018  . Benign essential hypertension, antepartum 05/19/2018  . Hypertension in pregnancy, antepartum 05/19/2018  . S/P cesarean section 02/22/2015  . PFO (patent foramen ovale) 02/18/2015  . Obesity in pregnancy, antepartum 02/15/2015  . Previous cesarean delivery, antepartum condition or complication 02/08/2015  . Atrial septal defect 01/13/2015  . History of cardiomegaly 10/28/2014  . First trimester screening     Past Surgical History:  Procedure Laterality Date  . CESAREAN SECTION  10/28/2008  . CESAREAN SECTION N/A 02/22/2015   Procedure: CESAREAN SECTION;  Surgeon: Nadara Mustard, MD;  Location: [facility] ORS;  Service: Obstetrics;  Laterality: N/A;  . CESAREAN SECTION  04/13/2010    Prior to Admission medications   Medication Sig Start Date End Date Taking? Authorizing Provider  aspirin 81 MG chewable tablet Chew 81 mg by mouth daily.    [provider]    labetalol (NORMODYNE) 100 MG tablet Take 100 mg by mouth 2 (two) times daily.    [provider]  Prenatal Vit-Fe Fumarate-FA (PRENATAL MULTIVITAMIN) TABS tablet Take 1 tablet by mouth daily at 12 noon.    [provider]     Allergies Patient has no known allergies.  Family History  Problem Relation Age of Onset  . Hypertension Mother   . Stroke Paternal Aunt   . Cancer Maternal Grandfather        Colon    Social History Social History   Tobacco Use  . Smoking status: Current Every Day Smoker    Packs/day: 0.25    Types: Cigarettes    Last attempt to quit: 05/23/2014    Years since quitting: 4.0  . Smokeless tobacco: Never Used  Substance Use Topics  . Alcohol use: No  . Drug use: No    Review of Systems  Constitutional: No fever/chills Eyes: No visual changes.  ENT: No sore throat. Cardiovascular: Denies chest pain. Respiratory: Denies shortness of breath. Gastrointestinal: No abdominal pain.  No nausea, no vomiting.   Genitourinary: Negative for dysuria. Musculoskeletal: Negative for back pain. Skin: Negative for rash. Neurological: Negative for headaches or weakness   ____________________________________________   PHYSICAL EXAM:  VITAL SIGNS: ED Triage Vitals [05/27/18 1850]  Enc Vitals Group     BP (!) 163/88     Pulse Rate (!) 103     Resp 18     Temp 98 F (36.7 C)     Temp Source Oral  SpO2 99 %     Weight (!) 138.3 kg (305 lb)     Height 1.702 m (5\' 7" )     Head Circumference      Peak Flow      Pain Score 10     Pain Loc      Pain Edu?      Excl. in GC?     Constitutional: Alert and oriented. No acute distress. Pleasant and interactive Eyes: Conjunctivae are normal.  PERRLA, EOMI Head: Atraumatic. Nose: No congestion/rhinnorhea. Mouth/Throat: Mucous membranes are moist.   Neck:  Painless ROM Cardiovascular: Normal rate, regular rhythm.   Good peripheral circulation. Respiratory: Normal respiratory effort.  No  retractions.  Gastrointestinal: Soft and nontender. No distention.    Musculoskeletal Warm and well perfused Neurologic:  Normal speech and language. No gross focal neurologic deficits are appreciated.  Skin:  Skin is warm, dry and intact. No rash noted. Psychiatric: Mood and affect are normal. Speech and behavior are normal.  ____________________________________________   LABS (all labs ordered are listed, but only abnormal results are displayed)  Labs Reviewed - No data to display ____________________________________________  EKG  None ____________________________________________  RADIOLOGY  None ____________________________________________   PROCEDURES  Procedure(s) performed: No  Procedures   Critical Care performed: No ____________________________________________   INITIAL IMPRESSION / ASSESSMENT AND PLAN / ED COURSE  Pertinent labs & imaging results that were available during my care of the patient were reviewed by me and considered in my medical decision making (see chart for details).  Patient well-appearing and in no acute distress with headache as described above.  Will treat with p.o. Reglan, p.o. Benadryl, p.o. Tylenol and reevaluate.  No abdominal pain no vaginal bleeding  ----------------------------------------- 9:47 PM on 05/27/2018 -----------------------------------------  Patient reports complete resolution of headache, she remains well-appearing, appropriate for discharge at this time    ____________________________________________   FINAL CLINICAL IMPRESSION(S) / ED DIAGNOSES  Final diagnoses:  Acute nonintractable headache, unspecified headache type        Note:  This document was prepared using Dragon voice recognition software and may include unintentional dictation errors.   Jene Every, MD 05/27/18 2147

## 2018-05-27 NOTE — ED Triage Notes (Signed)
Pt arrives with complaints of frontal headache that started yesterday. No neuro deficits present in triage. Pt reports she is [redacted] weeks pregnant

## 2018-05-29 ENCOUNTER — Other Ambulatory Visit: Payer: Self-pay

## 2018-05-29 DIAGNOSIS — O99210 Obesity complicating pregnancy, unspecified trimester: Secondary | ICD-10-CM

## 2018-06-02 ENCOUNTER — Ambulatory Visit: Payer: Self-pay

## 2018-06-02 ENCOUNTER — Other Ambulatory Visit: Payer: Self-pay | Admitting: Advanced Practice Midwife

## 2018-06-02 ENCOUNTER — Ambulatory Visit
Admission: RE | Admit: 2018-06-02 | Discharge: 2018-06-02 | Disposition: A | Discharge status: Home / Self Care | Payer: Medicaid Other | Source: Ambulatory Visit | Attending: Maternal & Fetal Medicine | Admitting: Maternal & Fetal Medicine

## 2018-06-02 ENCOUNTER — Telehealth: Payer: Self-pay

## 2018-06-02 DIAGNOSIS — Z3A20 20 weeks gestation of pregnancy: Secondary | ICD-10-CM | POA: Insufficient documentation

## 2018-06-02 DIAGNOSIS — O99210 Obesity complicating pregnancy, unspecified trimester: Secondary | ICD-10-CM

## 2018-06-02 DIAGNOSIS — Z8679 Personal history of other diseases of the circulatory system: Secondary | ICD-10-CM

## 2018-06-02 NOTE — Progress Notes (Signed)
Referral sent for maternal echo for history of ASD.

## 2018-06-02 NOTE — Telephone Encounter (Signed)
Spoke with [employer]. I ordered referral for Echo.

## 2018-06-02 NOTE — Telephone Encounter (Signed)
Ashley Howell from [facility] calling to see if order has been put in for echo for PFO.  If not, please let [name] know.  She saw Dr. Dolphus Jenny this am.  They don't want to duplicate order.  [name] # [phone]

## 2018-06-12 ENCOUNTER — Other Ambulatory Visit: Payer: Self-pay

## 2018-06-12 DIAGNOSIS — O10019 Pre-existing essential hypertension complicating pregnancy, unspecified trimester: Secondary | ICD-10-CM

## 2018-06-16 ENCOUNTER — Ambulatory Visit
Admission: RE | Admit: 2018-06-16 | Discharge: 2018-06-16 | Disposition: A | Discharge status: Home / Self Care | Payer: Medicaid Other | Source: Ambulatory Visit | Attending: Obstetrics and Gynecology | Admitting: Obstetrics and Gynecology

## 2018-06-16 VITALS — BP 141/83 | HR 100 | Temp 98.1°F | Resp 19 | Wt 308.5 lb

## 2018-06-16 DIAGNOSIS — O99212 Obesity complicating pregnancy, second trimester: Secondary | ICD-10-CM | POA: Diagnosis not present

## 2018-06-16 DIAGNOSIS — O10912 Unspecified pre-existing hypertension complicating pregnancy, second trimester: Secondary | ICD-10-CM | POA: Insufficient documentation

## 2018-06-16 DIAGNOSIS — Z79899 Other long term (current) drug therapy: Secondary | ICD-10-CM | POA: Insufficient documentation

## 2018-06-16 DIAGNOSIS — Z3A22 22 weeks gestation of pregnancy: Secondary | ICD-10-CM | POA: Diagnosis not present

## 2018-06-16 DIAGNOSIS — O10019 Pre-existing essential hypertension complicating pregnancy, unspecified trimester: Secondary | ICD-10-CM

## 2018-06-16 DIAGNOSIS — O34219 Maternal care for unspecified type scar from previous cesarean delivery: Secondary | ICD-10-CM | POA: Insufficient documentation

## 2018-06-16 DIAGNOSIS — O139 Gestational [pregnancy-induced] hypertension without significant proteinuria, unspecified trimester: Secondary | ICD-10-CM

## 2018-06-16 DIAGNOSIS — O10012 Pre-existing essential hypertension complicating pregnancy, second trimester: Secondary | ICD-10-CM | POA: Diagnosis present

## 2018-06-16 NOTE — Progress Notes (Signed)
MFM Follow up.  Ashley Howell is a 27yo [id number] at [redacted]w[redacted]d.  Pregnancy c/by HTN, morbid obesity (BMI 48), history of 3 prior c/sections and possible maternal PFO.  BP today 143/88.  Currently on labetalol 100mg  BID.  Had normal fetal echocardiogram on 3/2 with [facility].  Has maternal echocardiogram scheduled in April.  Recommend continued daily ASA (81 mg) and labetalol.  Will schedule follow up ultrasound for growth at about 28 weeks; recommend monthly ultrasounds for growth thereafter.  Recommend antenatal testing to start at 34 weeks (earlier if indicated).

## 2018-06-23 ENCOUNTER — Ambulatory Visit (INDEPENDENT_AMBULATORY_CARE_PROVIDER_SITE_OTHER): Payer: Medicaid Other | Admitting: Advanced Practice Midwife

## 2018-06-23 ENCOUNTER — Other Ambulatory Visit: Payer: Self-pay

## 2018-06-23 ENCOUNTER — Encounter: Payer: Self-pay | Admitting: Advanced Practice Midwife

## 2018-06-23 VITALS — BP 122/66 | Wt 307.0 lb

## 2018-06-23 DIAGNOSIS — Z130 Encounter for screening for diseases of the blood and blood-forming organs and certain disorders involving the immune mechanism: Secondary | ICD-10-CM

## 2018-06-23 DIAGNOSIS — Z131 Encounter for screening for diabetes mellitus: Secondary | ICD-10-CM

## 2018-06-23 DIAGNOSIS — O34219 Maternal care for unspecified type scar from previous cesarean delivery: Secondary | ICD-10-CM

## 2018-06-23 DIAGNOSIS — O0990 Supervision of high risk pregnancy, unspecified, unspecified trimester: Secondary | ICD-10-CM

## 2018-06-23 DIAGNOSIS — Z113 Encounter for screening for infections with a predominantly sexual mode of transmission: Secondary | ICD-10-CM

## 2018-06-23 DIAGNOSIS — Z3A23 23 weeks gestation of pregnancy: Secondary | ICD-10-CM

## 2018-06-23 LAB — POCT URINALYSIS DIPSTICK OB
Glucose, UA: NEGATIVE
POC,PROTEIN,UA: NEGATIVE

## 2018-06-23 NOTE — Progress Notes (Signed)
Routine Prenatal Care Visit  Subjective  Ashley Howell is a 28 y.o. [phone] at [redacted]w[redacted]d being seen today for ongoing prenatal care.  She is currently monitored for the following issues for this high-risk pregnancy and has First trimester screening; History of cardiomegaly; Atrial septal defect; Previous cesarean delivery, antepartum condition or complication; Obesity in pregnancy, antepartum; S/P cesarean section; Benign essential hypertension, antepartum; Hypertension in pregnancy, antepartum; PFO (patent foramen ovale); and Supervision of high risk pregnancy, antepartum on their problem list.  ----------------------------------------------------------------------------------- Patient reports no complaints.    . Vag. Bleeding: None.  Movement: Present. Denies leaking of fluid.  ----------------------------------------------------------------------------------- The following portions of the patient's history were reviewed and updated as appropriate: allergies, current medications, past family history, past medical history, past social history, past surgical history and problem list. Problem list updated.   Objective  Blood pressure 122/66, weight (!) 307 lb (139.3 kg), last menstrual period 01/08/2018. Pregravid weight 275 lb (124.7 kg) Total Weight Gain 32 lb (14.5 kg) Urinalysis: Urine Protein Negative  Urine Glucose Negative  Fetal Status: Fetal Heart Rate (bpm): 141   Movement: Present    Follow up anatomy on 3/9 complete, normal  PATIENT INFO:  ID #:       [id number]                          D.O.B.:  January 02, 1991 (27 yrs)  Name:       Ashley Howell             Visit Date: 06/16/2018 10:16 am ---------------------------------------------------------------------- PERFORMED BY:  Performed By:     Neita Carp             Referred By:      Paula Compton [facility]                    Sonographer                              LEATH ----------------------------------------------------------------------  SERVICE(S) PROVIDED:   [place] MFM OB FOLLOW UP                                  53976.73  ---------------------------------------------------------------------- INDICATIONS:   [redacted] weeks gestation of pregnancy                Z3A.22   Follow up anatomy   Morbid obesity (BMI 48)   Hypertension   History of 3 prior c-sections   Possible maternal PFO  ---------------------------------------------------------------------- FETAL EVALUATION:  Num Of Fetuses:         1  Fetal Heart Rate(bpm):  153  Cardiac Activity:       Present  Presentation:           Cephalic  Placenta:               Posterior, No previa doc previously ---------------------------------------------------------------------- GESTATIONAL AGE:  LMP:           22w 5d        Date:  01/08/18                 EDD:   10/15/18  Best:          22w 5d     Det. ByMarcella Dubs         EDD:   10/15/18                                      (  03/07/18) ---------------------------------------------------------------------- ANATOMY:  Lips:                  Normal appearance      Aortic Arch:            Within normal limits                         Visualized  RVOT:                  Within Normal Limits   Ductal Arch:            Within normal limits  LVOT:                  Within Normal Limits  Other:  Ao/Pa within normal limits ---------------------------------------------------------------------- IMPRESSION:  Intrauterine pregnancy with a best estimated gestational age  of [redacted] weeks 5 days.  Dating is based on earliest available  ultrasound, performed at [facility] on 03/07/2018;  measurements were reported as 8 weeks 2 days.  Exam limited to follow up anatomy (cardiac outflow tracts and  nose/lip).  Outflow tracts appear normal within the limitations of  ultrasound.  Nose/lip appears normal.  Other fetal anatomy  has been documented previously.  Fetal echocardiogram was performed on 3/2 and reported as  normal.  Maternal echocardiogram is  scheduled in April.  Will schedule follow up ultrasound for growth in about 6  weeks. ----------------------------------------------------------------------                      Kirby Funk, MD Electronically Signed Corrected Final Report  06/16/2018 10:41 am ----------------------------------------------------------------------   General:  Alert, oriented and cooperative. Patient is in no acute distress.  Skin: Skin is warm and dry. No rash noted.   Cardiovascular: Normal heart rate noted  Respiratory: Normal respiratory effort, no problems with respiration noted  Abdomen: Soft, gravid, appropriate for gestational age.       Pelvic:  Cervical exam deferred        Extremities: Normal range of motion.     Mental Status: Normal mood and affect. Normal behavior. Normal judgment and thought content.   Assessment   28 y.o. [id number] at [redacted]w[redacted]d by  10/15/2018, by Ultrasound presenting for routine prenatal visit  Plan   Pregnancy #5 Problems (from 04/07/18 to present)    No problems associated with this episode.     __________________________________________________________________ MFM Follow up. Ms. Seith is a 27yo [id number] at [redacted]w[redacted]d.  Pregnancy c/by HTN, morbid obesity (BMI 48), history of 3 prior c/sections and possible maternal PFO.  BP today 143/88.  Currently on labetalol 100mg  BID.  Had normal fetal echocardiogram on 3/2 with [facility].  Has maternal echocardiogram scheduled in April.  Recommend continued daily ASA (81 mg) and labetalol.  Will schedule follow up ultrasound for growth at about 28 weeks; recommend monthly ultrasounds for growth thereafter.  Recommend antenatal testing to start at 34 weeks (earlier if indicated). Kirby Funk, MD __________________________________________________________________  Preterm labor symptoms and general obstetric precautions including but not limited to vaginal bleeding, contractions, leaking of fluid  and fetal movement were reviewed in detail with the patient.   Return in about 4 weeks (around 07/21/2018) for 28 wk labs and rob.  [name], CNM 06/23/2018 11:52 AM

## 2018-06-23 NOTE — Progress Notes (Signed)
ROB

## 2018-06-28 ENCOUNTER — Observation Stay
Admission: EM | Admit: 2018-06-28 | Discharge: 2018-06-29 | Disposition: A | Discharge status: Home Health | Payer: Medicaid Other | Attending: Obstetrics & Gynecology | Admitting: Obstetrics & Gynecology

## 2018-06-28 ENCOUNTER — Other Ambulatory Visit: Payer: Self-pay

## 2018-06-28 DIAGNOSIS — Z3A24 24 weeks gestation of pregnancy: Secondary | ICD-10-CM | POA: Insufficient documentation

## 2018-06-28 DIAGNOSIS — W1789XA Other fall from one level to another, initial encounter: Secondary | ICD-10-CM | POA: Insufficient documentation

## 2018-06-28 DIAGNOSIS — R1030 Lower abdominal pain, unspecified: Secondary | ICD-10-CM | POA: Insufficient documentation

## 2018-06-28 DIAGNOSIS — O26899 Other specified pregnancy related conditions, unspecified trimester: Secondary | ICD-10-CM | POA: Diagnosis present

## 2018-06-28 DIAGNOSIS — O26892 Other specified pregnancy related conditions, second trimester: Principal | ICD-10-CM | POA: Insufficient documentation

## 2018-06-28 DIAGNOSIS — O368120 Decreased fetal movements, second trimester, not applicable or unspecified: Secondary | ICD-10-CM | POA: Insufficient documentation

## 2018-06-28 DIAGNOSIS — O132 Gestational [pregnancy-induced] hypertension without significant proteinuria, second trimester: Secondary | ICD-10-CM | POA: Insufficient documentation

## 2018-06-28 NOTE — OB Triage Note (Addendum)
Pt. presents to L&D after experiencing all-over abdominal cramping rated 9/10 since Friday AM.  She did fall going up a flight of stairs on Thursday PM and landed on her stomach.  She reports that pain is unrelieved with Tylenol or heat.  Also complains of decreased fetal movement.  Denies vaginal bleeding or LOF.

## 2018-06-29 DIAGNOSIS — W1789XA Other fall from one level to another, initial encounter: Secondary | ICD-10-CM | POA: Diagnosis not present

## 2018-06-29 DIAGNOSIS — O26892 Other specified pregnancy related conditions, second trimester: Secondary | ICD-10-CM | POA: Diagnosis not present

## 2018-06-29 DIAGNOSIS — O368120 Decreased fetal movements, second trimester, not applicable or unspecified: Secondary | ICD-10-CM | POA: Diagnosis not present

## 2018-06-29 DIAGNOSIS — R1030 Lower abdominal pain, unspecified: Secondary | ICD-10-CM

## 2018-06-29 DIAGNOSIS — O132 Gestational [pregnancy-induced] hypertension without significant proteinuria, second trimester: Secondary | ICD-10-CM | POA: Diagnosis not present

## 2018-06-29 DIAGNOSIS — O26899 Other specified pregnancy related conditions, unspecified trimester: Secondary | ICD-10-CM | POA: Diagnosis present

## 2018-06-29 DIAGNOSIS — Z3A24 24 weeks gestation of pregnancy: Secondary | ICD-10-CM | POA: Diagnosis not present

## 2018-06-29 LAB — CBC
HCT: 34.3 % — ABNORMAL LOW (ref 36.0–46.0)
Hemoglobin: 11.2 g/dL — ABNORMAL LOW (ref 12.0–15.0)
MCH: 27 pg (ref 26.0–34.0)
MCHC: 32.7 g/dL (ref 30.0–36.0)
MCV: 82.7 fL (ref 80.0–100.0)
Platelets: 349 10*3/uL (ref 150–400)
RBC: 4.15 MIL/uL (ref 3.87–5.11)
RDW: 12.9 % (ref 11.5–15.5)
WBC: 13.9 10*3/uL — ABNORMAL HIGH (ref 4.0–10.5)
nRBC: 0 % (ref 0.0–0.2)

## 2018-06-29 MED ORDER — ACETAMINOPHEN 325 MG PO TABS: 650.0000 mg | ORAL_TABLET | ORAL | Status: DC | PRN

## 2018-06-29 MED ORDER — LIDOCAINE HCL (PF) 1 % IJ SOLN: 30.0000 mL | INTRAMUSCULAR | Status: DC | PRN

## 2018-06-29 MED ORDER — LACTATED RINGERS IV SOLN: 500.0000 mL | INTRAVENOUS | Status: DC | PRN

## 2018-06-29 MED ORDER — OXYTOCIN 40 UNITS IN NORMAL SALINE INFUSION - SIMPLE MED: 2.5000 [IU]/h | INTRAVENOUS | Status: DC

## 2018-06-29 MED ORDER — LACTATED RINGERS IV SOLN: INTRAVENOUS | Status: DC

## 2018-06-29 MED ORDER — OXYTOCIN BOLUS FROM INFUSION: 500.0000 mL | Freq: Once | INTRAVENOUS | Status: DC

## 2018-06-29 NOTE — Discharge Summary (Signed)
  See FPN 

## 2018-06-29 NOTE — Final Progress Note (Signed)
Obstetrics Admission History & Physical   CC: Pain  HPI:  28 y.o. [id number] @ [redacted]w[redacted]d (10/15/2018, by Ultrasound). Admitted on 06/28/2018:   Patient Active Problem List   Diagnosis Date Noted  . Pregnancy with abdominal pain of lower quadrant, antepartum 06/29/2018  . Supervision of high risk pregnancy, antepartum 05/26/2018  . Benign essential hypertension, antepartum 05/19/2018  . Hypertension in pregnancy, antepartum 05/19/2018  . S/P cesarean section 02/22/2015  . PFO (patent foramen ovale) 02/18/2015  . Obesity in pregnancy, antepartum 02/15/2015  . Previous cesarean delivery, antepartum condition or complication 02/08/2015  . Atrial septal defect 01/13/2015  . History of cardiomegaly 10/28/2014  . First trimester screening      Presents for pain after she fell 2 days ago and has had persistant although intermittent pain. No bleeding.  She reports decreased fetal movement today.  No prior concerns.  Pain is lower quadrant abdominal, no radiation or other associated findings or modifiers.    Prenatal care at: at [facility]. Pregnancy complicated by gestational HTN.  ROS: A review of systems was performed and negative, except as stated in the above HPI. PMHx:  Past Medical History:  Diagnosis Date  . Anxiety   . GERD (gastroesophageal reflux disease)   . Heart defect    ASD  . Shortness of breath dyspnea    PSHx:  Past Surgical History:  Procedure Laterality Date  . CESAREAN SECTION  10/28/2008  . CESAREAN SECTION N/A 02/22/2015   Procedure: CESAREAN SECTION;  Surgeon: Nadara Mustard, MD;  Location: [facility] ORS;  Service: Obstetrics;  Laterality: N/A;  . CESAREAN SECTION  04/13/2010   Medications:  No medications prior to admission.   Allergies: has No Known Allergies. OBHx:  OB History  Gravida Para Term Preterm AB Living  5 3 3   1 3   SAB TAB Ectopic Multiple Live Births    1   0 3    # Outcome Date GA Lbr Len/2nd Weight Sex Delivery Anes PTL Lv  5 Current            4 Term 02/22/15 [redacted]w[redacted]d  2950 g F CS-LTranv Spinal  LIV  3 TAB 05/25/11          2 Term 04/13/10    F CS-Classical   LIV  1 Term 10/28/08    F CS-Classical   LIV   SWV:[id number] except as detailed in HPI.Marland Kitchen  No family history of birth defects. Soc Hx: Alcohol: none and Recreational drug use: none  Objective:   Vitals:   06/28/18 2354  BP: 135/74  Pulse: (!) 104  Resp: 18  Temp: 98.2 F (36.8 C)  FHT                    140s   Perinatal info:  Blood type: O positive Rubella- Immune Varicella -Not immune TDaP tetanus status unknown to the patient RPR NR / HIV Neg/ HBsAg Neg   Results for orders placed or performed during the hospital encounter of 06/28/18  CBC  Result Value Ref Range   WBC 13.9 (H) 4.0 - 10.5 K/uL   RBC 4.15 3.87 - 5.11 MIL/uL   Hemoglobin 11.2 (L) 12.0 - 15.0 g/dL   HCT 86.4 (L) 84.7 - 20.7 %   MCV 82.7 80.0 - 100.0 fL   MCH 27.0 26.0 - 34.0 pg   MCHC 32.7 30.0 - 36.0 g/dL   RDW 21.8 28.8 - 33.7 %   Platelets 349 150 - 400 K/uL  nRBC 0.0 0.0 - 0.2 %    Assessment & Plan:   28 y.o. [id number] @ [redacted]w[redacted]d with pain and decreased fetal movement Reassuring fetal heart rate findings discussed  No concern for abruption or PTL at this time  Plan outpatient follow up  Triage visit, patient not seen but lab results and FHR monitoring reviewed by self  Annamarie Major, MD, [facility], [facility] 06/29/2018  5:24 AM

## 2018-06-30 ENCOUNTER — Ambulatory Visit: Payer: Medicaid Other

## 2018-06-30 ENCOUNTER — Telehealth: Payer: Self-pay

## 2018-06-30 NOTE — Telephone Encounter (Signed)
Pt is 24w c/o queezy stomach.  What to do?  [phone]  Mailbox not set up yet.

## 2018-07-10 NOTE — Telephone Encounter (Signed)
Pt hasn't returned call.  Msg closed. 

## 2018-07-14 ENCOUNTER — Telehealth: Payer: Self-pay | Admitting: Cardiovascular Disease

## 2018-07-14 NOTE — Telephone Encounter (Signed)
Virtual Visit Pre-Appointment Phone Call  Steps For Call:  1. Confirm consent - "In the setting of the current Covid19 crisis, you are scheduled for a (phone or video) visit with your provider on (date) at (time).  Just as we do with many in-office visits, in order for you to participate in this visit, we must obtain consent.  If you'd like, I can send this to your mychart (if signed up) or email for you to review.  Otherwise, I can obtain your verbal consent now.  All virtual visits are billed to your insurance company just like a normal visit would be.  By agreeing to a virtual visit, we'd like you to understand that the technology does not allow for your provider to perform an examination, and thus may limit your provider's ability to fully assess your condition.  Finally, though the technology is pretty good, we cannot assure that it will always work on either your or our end, and in the setting of a video visit, we may have to convert it to a phone-only visit.  In either situation, we cannot ensure that we have a secure connection.  Are you willing to proceed?"  2. Give patient instructions for WebEx download to smartphone as below if video visit  3. Advise patient to be prepared with any vital sign or heart rhythm information, their current medicines, and a piece of paper and pen handy for any instructions they may receive the day of their visit  4. Inform patient they will receive a phone call 15 minutes prior to their appointment time (may be from unknown caller ID) so they should be prepared to answer  5. Confirm that appointment type is correct in Epic appointment notes (video vs telephone)    TELEPHONE CALL NOTE  Ashley Howell has been deemed a candidate for a follow-up tele-health visit to limit community exposure during the Covid-19 pandemic. I spoke with the patient via phone to ensure availability of phone/video source, confirm preferred email & phone number, and discuss  instructions and expectations.  I reminded Ashley Howell to be prepared with any vital sign and/or heart rhythm information that could potentially be obtained via home monitoring, at the time of her visit. I reminded Ashley Howell to expect a phone call at the time of her visit if her visit.  Did the patient verbally acknowledge consent to treatment? yes  Norman Herrlich 07/14/2018 2:19 PM   DOWNLOADING THE WEBEX SOFTWARE TO SMARTPHONE  - If Apple, go to [employer] and type in WebEx in the search bar. Download [employer], the blue/green circle. The app is free but as with any other app downloads, their phone may require them to verify saved payment information or Apple password. The patient does NOT have to create an account.  - If Android, ask patient to go to [employer] and type in WebEx in the search bar. Download [employer], the blue/green circle. The app is free but as with any other app downloads, their phone may require them to verify saved payment information or Android password. The patient does NOT have to create an account.   CONSENT FOR TELE-HEALTH VISIT - PLEASE REVIEW  I hereby voluntarily request, consent and authorize [facility] and its employed or contracted physicians, physician assistants, nurse practitioners or other licensed health care professionals (the Practitioner), to provide me with telemedicine health care services (the Services") as deemed necessary by the treating Practitioner. I acknowledge  and consent to receive the Services by the Practitioner via telemedicine. I understand that the telemedicine visit will involve communicating with the Practitioner through live audiovisual communication technology and the disclosure of certain medical information by electronic transmission. I acknowledge that I have been given the opportunity to request an in-person assessment or other available alternative prior to the telemedicine visit  and am voluntarily participating in the telemedicine visit.  I understand that I have the right to withhold or withdraw my consent to the use of telemedicine in the course of my care at any time, without affecting my right to future care or treatment, and that the Practitioner or I may terminate the telemedicine visit at any time. I understand that I have the right to inspect all information obtained and/or recorded in the course of the telemedicine visit and may receive copies of available information for a reasonable fee.  I understand that some of the potential risks of receiving the Services via telemedicine include:   Delay or interruption in medical evaluation due to technological equipment failure or disruption;  Information transmitted may not be sufficient (e.g. poor resolution of images) to allow for appropriate medical decision making by the Practitioner; and/or   In rare instances, security protocols could fail, causing a breach of personal health information.  Furthermore, I acknowledge that it is my responsibility to provide information about my medical history, conditions and care that is complete and accurate to the best of my ability. I acknowledge that Practitioner's advice, recommendations, and/or decision may be based on factors not within their control, such as incomplete or inaccurate data provided by me or distortions of diagnostic images or specimens that may result from electronic transmissions. I understand that the practice of medicine is not an exact science and that Practitioner makes no warranties or guarantees regarding treatment outcomes. I acknowledge that I will receive a copy of this consent concurrently upon execution via email to the email address I last provided but may also request a printed copy by calling the office of [facility].    I understand that my insurance will be billed for this visit.   I have read or had this consent read to me.  I understand  the contents of this consent, which adequately explains the benefits and risks of the Services being provided via telemedicine.   I have been provided ample opportunity to ask questions regarding this consent and the Services and have had my questions answered to my satisfaction.  I give my informed consent for the services to be provided through the use of telemedicine in my medical care  By participating in this telemedicine visit I agree to the above.

## 2018-07-21 ENCOUNTER — Encounter: Payer: Self-pay | Admitting: Advanced Practice Midwife

## 2018-07-21 ENCOUNTER — Other Ambulatory Visit: Payer: Medicaid Other

## 2018-07-21 ENCOUNTER — Other Ambulatory Visit: Payer: Self-pay

## 2018-07-21 ENCOUNTER — Ambulatory Visit (INDEPENDENT_AMBULATORY_CARE_PROVIDER_SITE_OTHER): Payer: Medicaid Other | Admitting: Advanced Practice Midwife

## 2018-07-21 VITALS — BP 132/64 | Wt 311.0 lb

## 2018-07-21 DIAGNOSIS — Z130 Encounter for screening for diseases of the blood and blood-forming organs and certain disorders involving the immune mechanism: Secondary | ICD-10-CM

## 2018-07-21 DIAGNOSIS — O34219 Maternal care for unspecified type scar from previous cesarean delivery: Secondary | ICD-10-CM

## 2018-07-21 DIAGNOSIS — O0990 Supervision of high risk pregnancy, unspecified, unspecified trimester: Secondary | ICD-10-CM

## 2018-07-21 DIAGNOSIS — Z131 Encounter for screening for diabetes mellitus: Secondary | ICD-10-CM

## 2018-07-21 DIAGNOSIS — Z3A27 27 weeks gestation of pregnancy: Secondary | ICD-10-CM

## 2018-07-21 DIAGNOSIS — Z113 Encounter for screening for infections with a predominantly sexual mode of transmission: Secondary | ICD-10-CM

## 2018-07-21 LAB — POCT URINALYSIS DIPSTICK OB
Glucose, UA: NEGATIVE
POC,PROTEIN,UA: NEGATIVE

## 2018-07-21 NOTE — Patient Instructions (Signed)
Third Trimester of Pregnancy The third trimester is from week 28 through week 40 (months 7 through 9). The third trimester is a time when the unborn baby (fetus) is growing rapidly. At the end of the ninth month, the fetus is about 20 inches in length and weighs 6-10 pounds. Body changes during your third trimester Your body will continue to go through many changes during pregnancy. The changes vary from woman to woman. During the third trimester:  Your weight will continue to increase. You can expect to gain 25-35 pounds (11-16 kg) by the end of the pregnancy.  You may begin to get stretch marks on your hips, abdomen, and breasts.  You may urinate more often because the fetus is moving lower into your pelvis and pressing on your bladder.  You may develop or continue to have heartburn. This is caused by increased hormones that slow down muscles in the digestive tract.  You may develop or continue to have constipation because increased hormones slow digestion and cause the muscles that push waste through your intestines to relax.  You may develop hemorrhoids. These are swollen veins (varicose veins) in the rectum that can itch or be painful.  You may develop swollen, bulging veins (varicose veins) in your legs.  You may have increased body aches in the pelvis, back, or thighs. This is due to weight gain and increased hormones that are relaxing your joints.  You may have changes in your hair. These can include thickening of your hair, rapid growth, and changes in texture. Some women also have hair loss during or after pregnancy, or hair that feels dry or thin. Your hair will most likely return to normal after your baby is born.  Your breasts will continue to grow and they will continue to become tender. A yellow fluid (colostrum) may leak from your breasts. This is the first milk you are producing for your baby.  Your belly button may stick out.  You may notice more swelling in your hands,  face, or ankles.  You may have increased tingling or numbness in your hands, arms, and legs. The skin on your belly may also feel numb.  You may feel short of breath because of your expanding uterus.  You may have more problems sleeping. This can be caused by the size of your belly, increased need to urinate, and an increase in your body's metabolism.  You may notice the fetus "dropping," or moving lower in your abdomen (lightening).  You may have increased vaginal discharge.  You may notice your joints feel loose and you may have pain around your pelvic bone. What to expect at prenatal visits You will have prenatal exams every 2 weeks until week 36. Then you will have weekly prenatal exams. During a routine prenatal visit:  You will be weighed to make sure you and the baby are growing normally.  Your blood pressure will be taken.  Your abdomen will be measured to track your baby's growth.  The fetal heartbeat will be listened to.  Any test results from the previous visit will be discussed.  You may have a cervical check near your due date to see if your cervix has softened or thinned (effaced).  You will be tested for Group B streptococcus. This happens between 35 and 37 weeks. Your health care provider may ask you:  What your birth plan is.  How you are feeling.  If you are feeling the baby move.  If you have had any abnormal   symptoms, such as leaking fluid, bleeding, severe headaches, or abdominal cramping.  If you are using any tobacco products, including cigarettes, chewing tobacco, and electronic cigarettes.  If you have any questions. Other tests or screenings that may be performed during your third trimester include:  Blood tests that check for low iron levels (anemia).  Fetal testing to check the health, activity level, and growth of the fetus. Testing is done if you have certain medical conditions or if there are problems during the pregnancy.  Nonstress test  (NST). This test checks the health of your baby to make sure there are no signs of problems, such as the baby not getting enough oxygen. During this test, a belt is placed around your belly. The baby is made to move, and its heart rate is monitored during movement. What is false labor? False labor is a condition in which you feel small, irregular tightenings of the muscles in the womb (contractions) that usually go away with rest, changing position, or drinking water. These are called Braxton Hicks contractions. Contractions may last for hours, days, or even weeks before true labor sets in. If contractions come at regular intervals, become more frequent, increase in intensity, or become painful, you should see your health care provider. What are the signs of labor?  Abdominal cramps.  Regular contractions that start at 10 minutes apart and become stronger and more frequent with time.  Contractions that start on the top of the uterus and spread down to the lower abdomen and back.  Increased pelvic pressure and dull back pain.  A watery or bloody mucus discharge that comes from the vagina.  Leaking of amniotic fluid. This is also known as your "water breaking." It could be a slow trickle or a gush. Let your health care provider know if it has a color or strange odor. If you have any of these signs, call your health care provider right away, even if it is before your due date. Follow these instructions at home: Medicines  Follow your health care provider's instructions regarding medicine use. Specific medicines may be either safe or unsafe to take during pregnancy.  Take a prenatal vitamin that contains at least 600 micrograms (mcg) of folic acid.  If you develop constipation, try taking a stool softener if your health care provider approves. Eating and drinking   Eat a balanced diet that includes fresh fruits and vegetables, whole grains, good sources of protein such as meat, eggs, or tofu,  and low-fat dairy. Your health care provider will help you determine the amount of weight gain that is right for you.  Avoid raw meat and uncooked cheese. These carry germs that can cause birth defects in the baby.  If you have low calcium intake from food, talk to your health care provider about whether you should take a daily calcium supplement.  Eat four or five small meals rather than three large meals a day.  Limit foods that are high in fat and processed sugars, such as fried and sweet foods.  To prevent constipation: ? Drink enough fluid to keep your urine clear or pale yellow. ? Eat foods that are high in fiber, such as fresh fruits and vegetables, whole grains, and beans. Activity  Exercise only as directed by your health care provider. Most women can continue their usual exercise routine during pregnancy. Try to exercise for 30 minutes at least 5 days a week. Stop exercising if you experience uterine contractions.  Avoid heavy lifting.  Do   not exercise in extreme heat or humidity, or at high altitudes.  Wear low-heel, comfortable shoes.  Practice good posture.  You may continue to have sex unless your health care provider tells you otherwise. Relieving pain and discomfort  Take frequent breaks and rest with your legs elevated if you have leg cramps or low back pain.  Take warm sitz baths to soothe any pain or discomfort caused by hemorrhoids. Use hemorrhoid cream if your health care provider approves.  Wear a good support bra to prevent discomfort from breast tenderness.  If you develop varicose veins: ? Wear support pantyhose or compression stockings as told by your healthcare provider. ? Elevate your feet for 15 minutes, 3-4 times a day. Prenatal care  Write down your questions. Take them to your prenatal visits.  Keep all your prenatal visits as told by your health care provider. This is important. Safety  Wear your seat belt at all times when driving.  Make  a list of emergency phone numbers, including numbers for family, friends, the hospital, and police and fire departments. General instructions  Avoid cat litter boxes and soil used by cats. These carry germs that can cause birth defects in the baby. If you have a cat, ask someone to clean the litter box for you.  Do not travel far distances unless it is absolutely necessary and only with the approval of your health care provider.  Do not use hot tubs, steam rooms, or saunas.  Do not drink alcohol.  Do not use any products that contain nicotine or tobacco, such as cigarettes and e-cigarettes. If you need help quitting, ask your health care provider.  Do not use any medicinal herbs or unprescribed drugs. These chemicals affect the formation and growth of the baby.  Do not douche or use tampons or scented sanitary pads.  Do not cross your legs for long periods of time.  To prepare for the arrival of your baby: ? Take prenatal classes to understand, practice, and ask questions about labor and delivery. ? Make a trial run to the hospital. ? Visit the hospital and tour the maternity area. ? Arrange for maternity or paternity leave through employers. ? Arrange for family and friends to take care of pets while you are in the hospital. ? Purchase a rear-facing car seat and make sure you know how to install it in your car. ? Pack your hospital bag. ? Prepare the baby's nursery. Make sure to remove all pillows and stuffed animals from the baby's crib to prevent suffocation.  Visit your dentist if you have not gone during your pregnancy. Use a soft toothbrush to brush your teeth and be gentle when you floss. Contact a health care provider if:  You are unsure if you are in labor or if your water has broken.  You become dizzy.  You have mild pelvic cramps, pelvic pressure, or nagging pain in your abdominal area.  You have lower back pain.  You have persistent nausea, vomiting, or  diarrhea.  You have an unusual or bad smelling vaginal discharge.  You have pain when you urinate. Get help right away if:  Your water breaks before 37 weeks.  You have regular contractions less than 5 minutes apart before 37 weeks.  You have a fever.  You are leaking fluid from your vagina.  You have spotting or bleeding from your vagina.  You have severe abdominal pain or cramping.  You have rapid weight loss or weight gain.  You have   shortness of breath with chest pain.  You notice sudden or extreme swelling of your face, hands, ankles, feet, or legs.  Your baby makes fewer than 10 movements in 2 hours.  You have severe headaches that do not go away when you take medicine.  You have vision changes. Summary  The third trimester is from week 28 through week 40, months 7 through 9. The third trimester is a time when the unborn baby (fetus) is growing rapidly.  During the third trimester, your discomfort may increase as you and your baby continue to gain weight. You may have abdominal, leg, and back pain, sleeping problems, and an increased need to urinate.  During the third trimester your breasts will keep growing and they will continue to become tender. A yellow fluid (colostrum) may leak from your breasts. This is the first milk you are producing for your baby.  False labor is a condition in which you feel small, irregular tightenings of the muscles in the womb (contractions) that eventually go away. These are called Braxton Hicks contractions. Contractions may last for hours, days, or even weeks before true labor sets in.  Signs of labor can include: abdominal cramps; regular contractions that start at 10 minutes apart and become stronger and more frequent with time; watery or bloody mucus discharge that comes from the vagina; increased pelvic pressure and dull back pain; and leaking of amniotic fluid. This information is not intended to replace advice given to you by your  health care provider. Make sure you discuss any questions you have with your health care provider. Document Released: 03/20/2001 Document Revised: 05/01/2016 Document Reviewed: 05/01/2016 Elsevier Interactive Patient Education  2019 [employer]  

## 2018-07-21 NOTE — Progress Notes (Signed)
ROB 28 week labs 

## 2018-07-21 NOTE — Progress Notes (Signed)
  Routine Prenatal Care Visit  Subjective  Ashley Howell is a 28 y.o. [phone] at [redacted]w[redacted]d being seen today for ongoing prenatal care.  She is currently monitored for the following issues for this high-risk pregnancy and has First trimester screening; History of cardiomegaly; Atrial septal defect; Previous cesarean delivery, antepartum condition or complication; Obesity in pregnancy, antepartum; S/P cesarean section; Benign essential hypertension, antepartum; Hypertension in pregnancy, antepartum; PFO (patent foramen ovale); Supervision of high risk pregnancy, antepartum; and Pregnancy with abdominal pain of lower quadrant, antepartum on their problem list.  ----------------------------------------------------------------------------------- Patient reports has some abdominal cramping at night that lasts for 15 minutes. She thinks it may be associated with drinking soda. Encouraged her to decrease soda intake.    . Vag. Bleeding: None.  Movement: Present. Denies leaking of fluid.  ----------------------------------------------------------------------------------- The following portions of the patient's history were reviewed and updated as appropriate: allergies, current medications, past family history, past medical history, past social history, past surgical history and problem list. Problem list updated.   Objective  Blood pressure 132/64, weight (!) 311 lb (141.1 kg), last menstrual period 01/08/2018. Pregravid weight 275 lb (124.7 kg) Total Weight Gain 36 lb (16.3 kg) Urinalysis: Urine Protein Negative  Urine Glucose Negative  Fetal Status: Fetal Heart Rate (bpm): 136   Movement: Present     General:  Alert, oriented and cooperative. Patient is in no acute distress.  Skin: Skin is warm and dry. No rash noted.   Cardiovascular: Normal heart rate noted  Respiratory: Normal respiratory effort, no problems with respiration noted  Abdomen: Soft, gravid, appropriate for gestational age.        Pelvic:  Cervical exam deferred        Extremities: Normal range of motion.     Mental Status: Normal mood and affect. Normal behavior. Normal judgment and thought content.   Assessment   28 y.o. [id number] at [redacted]w[redacted]d by  10/15/2018, by Ultrasound presenting for routine prenatal visit  Plan   Pregnancy #5 Problems (from 04/07/18 to present)    No problems associated with this episode.       Preterm labor symptoms and general obstetric precautions including but not limited to vaginal bleeding, contractions, leaking of fluid and fetal movement were reviewed in detail with the patient. Please refer to After Visit Summary for other counseling recommendations.   Return in about 4 weeks (around 08/18/2018) for rob.  [name], CNM 07/21/2018 10:31 AM

## 2018-07-22 LAB — 28 WEEK RH+PANEL
Basophils Absolute: 0 10*3/uL (ref 0.0–0.2)
Basos: 0 %
EOS (ABSOLUTE): 0 10*3/uL (ref 0.0–0.4)
Eos: 0 %
Gestational Diabetes Screen: 126 mg/dL (ref 65–139)
HIV Screen 4th Generation wRfx: NONREACTIVE
Hematocrit: 33.3 % — ABNORMAL LOW (ref 34.0–46.6)
Hemoglobin: 10.6 g/dL — ABNORMAL LOW (ref 11.1–15.9)
Immature Grans (Abs): 0 10*3/uL (ref 0.0–0.1)
Immature Granulocytes: 0 %
Lymphocytes Absolute: 1.7 10*3/uL (ref 0.7–3.1)
Lymphs: 17 %
MCH: 26.1 pg — ABNORMAL LOW (ref 26.6–33.0)
MCHC: 31.8 g/dL (ref 31.5–35.7)
MCV: 82 fL (ref 79–97)
Monocytes Absolute: 0.5 10*3/uL (ref 0.1–0.9)
Monocytes: 5 %
Neutrophils Absolute: 7.3 10*3/uL — ABNORMAL HIGH (ref 1.4–7.0)
Neutrophils: 78 %
Platelets: 326 10*3/uL (ref 150–450)
RBC: 4.06 x10E6/uL (ref 3.77–5.28)
RDW: 12.8 % (ref 11.7–15.4)
RPR Ser Ql: NONREACTIVE
WBC: 9.5 10*3/uL (ref 3.4–10.8)

## 2018-07-28 ENCOUNTER — Other Ambulatory Visit: Payer: Self-pay

## 2018-07-28 ENCOUNTER — Ambulatory Visit: Payer: Self-pay

## 2018-07-28 ENCOUNTER — Observation Stay
Admission: RE | Admit: 2018-07-28 | Discharge: 2018-07-28 | Disposition: A | Discharge status: Home / Self Care | Payer: Medicaid Other | Attending: Obstetrics and Gynecology | Admitting: Obstetrics and Gynecology

## 2018-07-28 ENCOUNTER — Ambulatory Visit
Admission: RE | Admit: 2018-07-28 | Discharge: 2018-07-28 | Disposition: A | Discharge status: Home / Self Care | Payer: Medicaid Other | Source: Ambulatory Visit | Attending: Maternal & Fetal Medicine | Admitting: Maternal & Fetal Medicine

## 2018-07-28 DIAGNOSIS — Z7982 Long term (current) use of aspirin: Secondary | ICD-10-CM | POA: Insufficient documentation

## 2018-07-28 DIAGNOSIS — I10 Essential (primary) hypertension: Secondary | ICD-10-CM | POA: Diagnosis present

## 2018-07-28 DIAGNOSIS — Z79899 Other long term (current) drug therapy: Secondary | ICD-10-CM | POA: Insufficient documentation

## 2018-07-28 DIAGNOSIS — Z3A28 28 weeks gestation of pregnancy: Secondary | ICD-10-CM | POA: Insufficient documentation

## 2018-07-28 DIAGNOSIS — O99213 Obesity complicating pregnancy, third trimester: Secondary | ICD-10-CM | POA: Insufficient documentation

## 2018-07-28 DIAGNOSIS — O133 Gestational [pregnancy-induced] hypertension without significant proteinuria, third trimester: Secondary | ICD-10-CM | POA: Diagnosis not present

## 2018-07-28 DIAGNOSIS — F17210 Nicotine dependence, cigarettes, uncomplicated: Secondary | ICD-10-CM | POA: Diagnosis not present

## 2018-07-28 DIAGNOSIS — O99333 Smoking (tobacco) complicating pregnancy, third trimester: Secondary | ICD-10-CM | POA: Diagnosis not present

## 2018-07-28 DIAGNOSIS — O99613 Diseases of the digestive system complicating pregnancy, third trimester: Secondary | ICD-10-CM | POA: Insufficient documentation

## 2018-07-28 DIAGNOSIS — K219 Gastro-esophageal reflux disease without esophagitis: Secondary | ICD-10-CM | POA: Diagnosis not present

## 2018-07-28 DIAGNOSIS — O26893 Other specified pregnancy related conditions, third trimester: Secondary | ICD-10-CM | POA: Diagnosis not present

## 2018-07-28 DIAGNOSIS — O0990 Supervision of high risk pregnancy, unspecified, unspecified trimester: Secondary | ICD-10-CM

## 2018-07-28 DIAGNOSIS — O163 Unspecified maternal hypertension, third trimester: Secondary | ICD-10-CM | POA: Insufficient documentation

## 2018-07-28 DIAGNOSIS — O99343 Other mental disorders complicating pregnancy, third trimester: Secondary | ICD-10-CM | POA: Insufficient documentation

## 2018-07-28 DIAGNOSIS — R030 Elevated blood-pressure reading, without diagnosis of hypertension: Secondary | ICD-10-CM | POA: Diagnosis not present

## 2018-07-28 DIAGNOSIS — Z8249 Family history of ischemic heart disease and other diseases of the circulatory system: Secondary | ICD-10-CM | POA: Insufficient documentation

## 2018-07-28 DIAGNOSIS — F419 Anxiety disorder, unspecified: Secondary | ICD-10-CM | POA: Insufficient documentation

## 2018-07-28 LAB — COMPREHENSIVE METABOLIC PANEL
ALT: 15 U/L (ref 0–44)
AST: 15 U/L (ref 15–41)
Albumin: 3 g/dL — ABNORMAL LOW (ref 3.5–5.0)
Alkaline Phosphatase: 72 U/L (ref 38–126)
Anion gap: 7 (ref 5–15)
BUN: 6 mg/dL (ref 6–20)
CO2: 20 mmol/L — ABNORMAL LOW (ref 22–32)
Calcium: 9.1 mg/dL (ref 8.9–10.3)
Chloride: 107 mmol/L (ref 98–111)
Creatinine, Ser: 0.4 mg/dL — ABNORMAL LOW (ref 0.44–1.00)
GFR calc Af Amer: >60 mL/min (ref >60)
GFR calc non Af Amer: >60 mL/min (ref >60)
Glucose, Bld: 98 mg/dL (ref 70–99)
Potassium: 3.6 mmol/L (ref 3.5–5.1)
Sodium: 134 mmol/L — ABNORMAL LOW (ref 135–145)
Total Bilirubin: 0.2 mg/dL — ABNORMAL LOW (ref 0.3–1.2)
Total Protein: 6.9 g/dL (ref 6.5–8.1)

## 2018-07-28 LAB — CBC
HCT: 33.1 % — ABNORMAL LOW (ref 36.0–46.0)
Hemoglobin: 10.9 g/dL — ABNORMAL LOW (ref 12.0–15.0)
MCH: 26.6 pg (ref 26.0–34.0)
MCHC: 32.9 g/dL (ref 30.0–36.0)
MCV: 80.7 fL (ref 80.0–100.0)
Platelets: 364 10*3/uL (ref 150–400)
RBC: 4.1 MIL/uL (ref 3.87–5.11)
RDW: 12.9 % (ref 11.5–15.5)
WBC: 13 10*3/uL — ABNORMAL HIGH (ref 4.0–10.5)
nRBC: 0 % (ref 0.0–0.2)

## 2018-07-28 LAB — PROTEIN / CREATININE RATIO, URINE
Creatinine, Urine: 312 mg/dL
Protein Creatinine Ratio: 0.07 mg/mg{Cre} (ref 0.00–0.15)
Total Protein, Urine: 22 mg/dL

## 2018-07-28 NOTE — Progress Notes (Signed)
Pt brought to OBS 4 via wheelchair for [facility] Evaluation from [facility] post appt this AM.  Pt had elevated BP's x 3 in the clinicand c/o HA. See Epic for VS results.  Verbal report given to [name], CNM on arrival to unit with [facility] visit summary as well.

## 2018-07-28 NOTE — OB Triage Note (Signed)
Pt is a 27yo G5P3 at [redacted]w[redacted]d that was sent to L&D from [facility]FM for [facility] evaluation. Pt states she has had a headache the past two days that tylenol resolved. Pt denies Blurred vision, Epigastric pain, has no clonus and plus 1 reflexes. Pt denies VB, LOF and states positive FM. Pt has had 3 previous c/s first due to fetal intolerance last two were repeat. Pt reports taking her labetalol as scheduled twice daily without missing doses. Initial BP 108/57 with them cycling q15. Initial FHT 140 with monitors applied and assessing.

## 2018-07-28 NOTE — OB Triage Note (Signed)
Pt discharged home. All vitals and labs WDL. Pt verbalized understanding of instructions and has no further questions at this time.

## 2018-07-28 NOTE — Discharge Summary (Signed)
Physician Final Progress Note  Patient ID: UNDRA TREMBATH MRN: [id number] DOB/AGE: [birth year] 27 y.o.  Admit date: 07/28/2018 Admitting provider: Vena Austria, MD Discharge date: 07/28/2018   Admission Diagnoses: elevated blood pressure at MFM visit  Discharge Diagnoses:  Active Problems:   Elevated blood pressure affecting pregnancy in third trimester, antepartum IUP at 28 weeks Reassuring Fetal Heart Rate Normotensive  History of Present Illness: The patient is a 28 y.o. female [phone] at [redacted]w[redacted]d who was sent to L&D from MFM visit for [facility] evaluation. The patient is being treated for gestational hypertension and is currently taking Labetalol 100 mg BID. While at MFM visit her blood pressure was mildly elevated 157/87. She has had a mild headache the last 2 days and has had relief with PO Tylenol. She denies any current headache, visual changes or epigastric pain. While on L&D she has been normotensive with appropriate size cuff. Her labs are normal and fetal heart rate is normal for [redacted] week gestation. BPP this morning at MFM was 8/8. She has follow up scheduled with [facility] and with MFM.    Past Medical History:  Diagnosis Date  . Anxiety   . GERD (gastroesophageal reflux disease)   . Heart defect    ASD  . Shortness of breath dyspnea     Past Surgical History:  Procedure Laterality Date  . CESAREAN SECTION  10/28/2008  . CESAREAN SECTION N/A 02/22/2015   Procedure: CESAREAN SECTION;  Surgeon: Nadara Mustard, MD;  Location: [facility] ORS;  Service: Obstetrics;  Laterality: N/A;  . CESAREAN SECTION  04/13/2010    No current facility-administered medications on file prior to encounter.    Current Outpatient Medications on File Prior to Encounter  Medication Sig Dispense Refill  . aspirin 81 MG chewable tablet Chew 81 mg by mouth daily.    Marland Kitchen labetalol (NORMODYNE) 100 MG tablet Take 100 mg by mouth 2 (two) times daily.    . Prenatal Vit-Fe Fumarate-FA (PRENATAL  MULTIVITAMIN) TABS tablet Take 1 tablet by mouth daily at 12 noon.      No Known Allergies  Social History   Socioeconomic History  . Marital status: Single    Spouse name: Not on file  . Number of children: 3  . Years of education: Not on file  . Highest education level: Not on file  Occupational History  . Occupation: CNA  Social Needs  . Financial resource strain: Not hard at all  . Food insecurity:    Worry: Never true    Inability: Never true  . Transportation needs:    Medical: No    Non-medical: No  Tobacco Use  . Smoking status: Current Every Day Smoker    Packs/day: 0.25    Types: Cigarettes    Last attempt to quit: 05/23/2014    Years since quitting: 4.1  . Smokeless tobacco: Never Used  Substance and Sexual Activity  . Alcohol use: No  . Drug use: No  . Sexual activity: Yes    Birth control/protection: Surgical    Comment: BTL  Lifestyle  . Physical activity:    Days per week: 4 days    Minutes per session: 30 min  . Stress: Not at all  Relationships  . Social connections:    Talks on phone: Three times a week    Gets together: Twice a week    Attends religious service: 1 to 4 times per year    Active member of club or organization: No    Attends  meetings of clubs or organizations: Never    Relationship status: Living with partner  . Intimate partner violence:    Fear of current or ex partner: No    Emotionally abused: No    Physically abused: No    Forced sexual activity: No  Other Topics Concern  . Not on file  Social History Narrative  . Not on file    Family History  Problem Relation Age of Onset  . Hypertension Mother   . Stroke Paternal Aunt   . Cancer Maternal Grandfather        Colon     Review of Systems  Constitutional: Negative.   HENT: Negative.   Eyes: Negative.   Respiratory: Negative.   Cardiovascular: Negative.   Gastrointestinal: Negative.   Genitourinary: Negative.   Musculoskeletal: Negative.   Skin: Negative.    Neurological: Negative.   Endo/Heme/Allergies: Negative.   Psychiatric/Behavioral: Negative.      Physical Exam: BP 114/63   Pulse 86   Temp 98.2 F (36.8 C) (Oral)   Resp 20   Ht 5\' 7"  (1.702 m)   Wt (!) 143.8 kg   LMP 01/08/2018 (Approximate)   BMI 49.65 kg/m   Constitutional: Well nourished, morbidly obese, female in no acute distress.  HEENT: normal Skin: Warm and dry.  Cardiovascular: Regular rate and rhythm.   Extremity: trace edema  Respiratory: Clear to auscultation bilateral. Normal respiratory effort Abdomen: FHT present, 140s baseline Back: no CVAT Neuro: DTRs 2+, Cranial nerves grossly intact Psych: Alert and Oriented x3. No memory deficits. Normal mood and affect.  MS: normal gait, normal bilateral lower extremity ROM/strength/stability.  Pelvic exam: deferred Toco: negative Fetal Well Being: 145 bpm, moderate variability, + 10x10 accelerations, -decelerations  Consults: None  Significant Findings/ Diagnostic Studies: labs:  Results for Marcella, MELLANIE ALTSCHULER (MRN [id number]) as of 07/28/2018 13:04  Ref. Range 07/28/2018 11:01 07/28/2018 11:52 07/28/2018 12:12  COMPREHENSIVE METABOLIC PANEL Unknown   Rpt (A)  Sodium Latest Ref Range: 135 - 145 mmol/L   134 (L)  Potassium Latest Ref Range: 3.5 - 5.1 mmol/L   3.6  Chloride Latest Ref Range: 98 - 111 mmol/L   107  CO2 Latest Ref Range: 22 - 32 mmol/L   20 (L)  Glucose Latest Ref Range: 70 - 99 mg/dL   98  BUN Latest Ref Range: 6 - 20 mg/dL   6  Creatinine Latest Ref Range: 0.44 - 1.00 mg/dL   1.93 (L)  Calcium Latest Ref Range: 8.9 - 10.3 mg/dL   9.1  Anion gap Latest Ref Range: 5 - 15    7  Alkaline Phosphatase Latest Ref Range: 38 - 126 U/L   72  Albumin Latest Ref Range: 3.5 - 5.0 g/dL   3.0 (L)  AST Latest Ref Range: 15 - 41 U/L   15  ALT Latest Ref Range: 0 - 44 U/L   15  Total Protein Latest Ref Range: 6.5 - 8.1 g/dL   6.9  Total Bilirubin Latest Ref Range: 0.3 - 1.2 mg/dL   0.2 (L)  GFR, Est Non  African American Latest Ref Range: >60 mL/min   >60  GFR, Est African American Latest Ref Range: >60 mL/min   >60  WBC Latest Ref Range: 4.0 - 10.5 K/uL   13.0 (H)  RBC Latest Ref Range: 3.87 - 5.11 MIL/uL   4.10  Hemoglobin Latest Ref Range: 12.0 - 15.0 g/dL   79.0 (L)  HCT Latest Ref Range: 36.0 - 46.0 %  33.1 (L)  MCV Latest Ref Range: 80.0 - 100.0 fL   80.7  MCH Latest Ref Range: 26.0 - 34.0 pg   26.6  MCHC Latest Ref Range: 30.0 - 36.0 g/dL   62.9  RDW Latest Ref Range: 11.5 - 15.5 %   12.9  Platelets Latest Ref Range: 150 - 400 K/uL   364  nRBC Latest Ref Range: 0.0 - 0.2 %   0.0  Total Protein, Urine Latest Units: mg/dL  22   Protein Creatinine Ratio Latest Ref Range: 0.00 - 0.15 mg/mgCre  0.07   Creatinine, Urine Latest Units: mg/dL  528   [place] MFM OB FOLLOW UP Unknown Rpt     OBSTETRICS REPORT                       (Signed Final 07/28/2018 11:14 am) ---------------------------------------------------------------------- PATIENT INFO:  ID #:       [id number]                          D.O.B.:  [birth year] (27 yrs)  Name:       Ashley Howell             Visit Date: 07/28/2018 10:58 am ---------------------------------------------------------------------- PERFORMED BY:  Performed By:     Ceasar Mons          Referred By:      Paula Compton [facility]                    Sonographer                              LEATH ---------------------------------------------------------------------- SERVICE(S) PROVIDED:   [place] MFM OB FOLLOW UP                                  [id number]  ---------------------------------------------------------------------- INDICATIONS:   [redacted] weeks gestation of pregnancy                Z3A.28  ---------------------------------------------------------------------- FETAL EVALUATION:  Num Of Fetuses:         1  Fetal Heart Rate(bpm):  144  Cardiac Activity:       Present  Presentation:           Cephalic  Placenta:               Posterior  Amniotic Fluid  AFI FV:       Within normal limits  AFI Sum(cm)     %Tile       Largest Pocket(cm)  15.52           55          6.02  RUQ(cm)       RLQ(cm)       LUQ(cm)        LLQ(cm)  3.02          3.49          2.99           6.02 ---------------------------------------------------------------------- BIOMETRY:  BPD:      69.9  mm     G. Age:  28w 0d         19  %    CI:        76.34   %    70 - 86  FL/HC:      23.1   %    19.6 - 20.8  HC:      253.5  mm     G. Age:  27w 4d        < 3  %    HC/AC:      1.05        0.99 - 1.21  AC:      242.4  mm     G. Age:  28w 4d         37  %    FL/BPD:     83.7   %    71 - 87  FL:       58.5  mm     G. Age:  30w 4d         83  %    FL/AC:      24.1   %    20 - 24  HUM:      49.1  mm     G. Age:  29w 0d         46  %  Est. FW:    1329  gm    2 lb 15 oz      50  % ---------------------------------------------------------------------- GESTATIONAL AGE:  LMP:           28w 5d        Date:  01/08/18                 EDD:   10/15/18  U/S Today:     28w 5d                                        EDD:   10/15/18  Best:          28w 5d     Det. ByMarcella Dubs:  Early Ultrasound         EDD:   10/15/18                                      (03/07/18) ---------------------------------------------------------------------- ANATOMY:  Cavum:                 Within Normal Limits   LVOT:                   Not visualized due                                                                        to position  Ventricles:            Normal appearance      Stomach:                Seen  Posterior Fossa:       Within Normal Limits   Kidneys:                Normal appearance  Face:                  Within Normal Limits  Bladder:                Seen  Heart:                 Not seen due to        Spine:                  Normal appearance                         fetal position  RVOT:                  Not visualized due                         to  position ---------------------------------------------------------------------- CERVIX UTERUS ADNEXA:  Cervix  Length:            4.3  cm. ---------------------------------------------------------------------- IMPRESSION:  Thank you for referring your patient for follow up fetal growth  assessment Pregnancy c/by HTN, morbid obesity (BMI 48),  history of 3 prior c/sections and possible maternal PFO.  There is a singleton gestation at 28 weeks 5 days with normal  amniotic fluid volume.  Dating is by earliest available  ultrasound performe da [facility] on 03/08/19; measurements  were reported as 8 weeks 2 days.  The fetal biometry is consistent with the gestaitonal age.  Interval growth is appropriate and active fetal movements are  seen.  Incidental BPP 8/8.  Recommend follow-up scan for fetal growth in 4 weeks.  We  can review her maternal echo at that time, as well.  Thank you for allowing [place] to participate in your patient's care.  Please do not hesitate to contact us if we can be of further  assistance. ---------------------------------------------------------------------- RECOMMENDATIONS:  MFM Follow up.  Ms. Amick is a 27yo [id number] at [redacted]w[redacted]d.  Pregnancy c/by HTN, morbid obesity (BMI 48), history of 3  prior c/sections and possible maternal PFO.  BP today 147/87, repeat 151/82.  She reports headache  yesterday (none presently), no RUQ/epigastric discomfort.  She denies peripheral edema.  Currently on labetalol  BID.  Had normal fetal echocardiogram on 3/2 with [facility].  Has maternal echocardiogram scheduled tomorrow but states  she is going to have videoconference with cardiologist.  Recommend continued daily ASA (81 mg) and labetalol.  Will schedule follow up ultrasound for growth at about 28  weeks;  Given the elevations in bp today, I referred her to labor and  delivery for preelcampsia evaluation.  If she has  preeclampsia labs are negative, her  labetalol can be  increased to  BID.  If Reviewed plans with patient.  She is aware of signs of severe preeclampsia, has bp cuff at  home, and is scheduled for follow up with [facility].  I  advised her to bring her cuff to her next visit in order to  calibrate with your office device.  Recommend antenatal testing to start at 34 weeks (earlier if  indicated). ----------------------------------------------------------------------                   Consuelo Pandy, MD Electronically Signed Final Report   07/28/2018 11:14 am  Procedures: NST  Hospital Course: The patient was admitted to Labor and Delivery Triage for observation.   Discharge Condition: good  Disposition: Discharge disposition: 01-Home or Self Care     Diet: healthy pregnancy diet  Discharge Activity: Activity as tolerated  Discharge Instructions    Discharge activity:  No Restrictions   Complete by:  As directed    Discharge diet:   Complete by:  As directed    Healthy decreased carbohydrate diet  Decrease soda intake Increase H2O intake   Fetal Kick Count:  Lie on our left side for one hour after a meal, and count the number of times your baby kicks.  If it is less than 5 times, get up, move around and drink some juice.  Repeat the test 30 minutes later.  If it is still less than 5 kicks in an hour, notify your doctor.   Complete by:  As directed    No sexual activity restrictions   Complete by:  As directed    Notify physician for a general feeling that "something is not right"   Complete by:  As directed    Notify physician for increase or change in vaginal discharge   Complete by:  As directed    Notify physician for intestinal cramps, with or without diarrhea, sometimes described as "gas pain"   Complete by:  As directed    Notify physician for leaking of fluid   Complete by:  As directed    Notify physician for low, dull backache, unrelieved by heat or Tylenol   Complete by:  As directed    Notify  physician for menstrual like cramps   Complete by:  As directed    Notify physician for pelvic pressure   Complete by:  As directed    Notify physician for uterine contractions.  These may be painless and feel like the uterus is tightening or the baby is  "balling up"   Complete by:  As directed    Notify physician for vaginal bleeding   Complete by:  As directed    PRETERM LABOR:  Includes any of the follwing symptoms that occur between 20 - [redacted] weeks gestation.  If these symptoms are not stopped, preterm labor can result in preterm delivery, placing your baby at risk   Complete by:  As directed      Allergies as of 07/28/2018   No Known Allergies     Medication List    TAKE these medications   aspirin 81 MG chewable tablet Chew 81 mg by mouth daily.   labetalol 100 MG tablet Commonly known as:  NORMODYNE Take 100 mg by mouth 2 (two) times daily.   prenatal multivitamin Tabs tablet Take 1 tablet by mouth daily at 12 noon.      Follow-up Information    [facility]. Go to.   Specialty:  Obstetrics and Gynecology Why:  follow up as scheduled Contact information: [address] [place] [id number] [phone]          Total time spent taking care of this patient: 15 minutes  Signed: [name], CNM  07/28/2018, 12:52 PM

## 2018-07-28 NOTE — Progress Notes (Deleted)
Virtual Visit via Video Note   This visit type was conducted due to national recommendations for restrictions regarding the COVID-19 Pandemic (e.g. social distancing) in an effort to limit this patient's exposure and mitigate transmission in our community.  Due to her co-morbid illnesses, this patient is at least at moderate risk for complications without adequate follow up.  This format is felt to be most appropriate for this patient at this time.  All issues noted in this document were discussed and addressed.  A limited physical exam was performed with this format.  Please refer to the patient's chart for her consent to telehealth for [facility].   I connected with  Ashley Howell on 07/28/18 by a video enabled telemedicine application and verified that I am speaking with the correct person using two identifiers. I discussed the limitations of evaluation and management by telemedicine. The patient expressed understanding and agreed to proceed.   Evaluation Performed:  Follow-up visit  Date:  07/28/2018   ID:  Ashley Howell, DOB Apr 23, 1990, MRN [id number]  Patient Location:  [address] [place] [address]   Provider location:   [place], [employer] office  PCP:  [facility]  Cardiologist:  Mariah Milling, [facility]   Chief Complaint:      History of Present Illness:    Ashley Howell is a 28 y.o. female who presents via audio/video conferencing for a telehealth visit today.   The patient does not symptoms concerning for COVID-19 infection (fever, chills, cough, or new SHORTNESS OF BREATH).   Patient has a past medical history of  [redacted] weeks pregnant PFO Morbid obesity Referred by [name] for history of PFO/ASD, need for maternal echo      Previous echocardiogram September 2016 - Left ventricle: The cavity size was normal. Systolic function was   normal. The estimated ejection fraction was in the range of  55%   to 60%. Wall motion was normal; there were no regional wall   motion abnormalities. Left ventricular diastolic function   parameters were normal. - Aortic valve: Valve area (Vmax): 2.49 cm^2. - Left atrium: The atrium was mildly dilated. - Atrial septum: There was a patent foramen ovale. Echo contrast   study showed no right-to-left atrial level shunt, at baseline or   with provocation.  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 Consider f/u echo. Read by Dr. Welton Flakes   Prior CV studies:   The following studies were reviewed today:    Past Medical History:  Diagnosis Date  . Anxiety   . GERD (gastroesophageal reflux disease)   . Heart defect    ASD  . Shortness of breath dyspnea    Past Surgical History:  Procedure Laterality Date  . CESAREAN SECTION  10/28/2008  . CESAREAN SECTION N/A 02/22/2015   Procedure: CESAREAN SECTION;  Surgeon: Nadara Mustard, MD;  Location: [facility] ORS;  Service: Obstetrics;  Laterality: N/A;  . CESAREAN SECTION  04/13/2010     No outpatient medications have been marked as taking for the 07/29/18 encounter (Appointment) with Antonieta Iba, MD.     Allergies:   Patient has no known allergies.   Social History   Tobacco Use  . Smoking status: Current Every Day Smoker    Packs/day: 0.25    Types: Cigarettes    Last attempt to quit: 05/23/2014    Years since quitting: 4.1  . Smokeless tobacco:  Never Used  Substance Use Topics  . Alcohol use: No  . Drug use: No     Current Outpatient Medications on File Prior to Visit  Medication Sig Dispense Refill  . aspirin 81 MG chewable tablet Chew 81 mg by mouth daily.    Marland Kitchen. labetalol (NORMODYNE) 100 MG tablet Take 100 mg by mouth 2 (two) times daily.    . Prenatal Vit-Fe Fumarate-FA (PRENATAL MULTIVITAMIN) TABS tablet Take 1 tablet by mouth daily at 12 noon.     No current facility-administered medications on file prior to visit.      Family Hx: The patient's  family history includes Cancer in her maternal grandfather; Hypertension in her mother; Stroke in her paternal aunt.  ROS:   Please see the history of present illness.    ROS    Labs/Other Tests and Data Reviewed:    Recent Labs: 07/28/2018: ALT 15; BUN 6; Creatinine, Ser 0.40; Hemoglobin 10.9; Platelets 364; Potassium 3.6; Sodium 134   Recent Lipid Panel No results found for: CHOL, TRIG, HDL, CHOLHDL, LDLCALC, LDLDIRECT  Wt Readings from Last 3 Encounters:  07/28/18 (!) 317 lb (143.8 kg)  07/28/18 (!) 312 lb 4.8 oz (141.7 kg)  07/21/18 (!) 311 lb (141.1 kg)     Exam:    Vital Signs: Vital signs may also be detailed in the HPI LMP 01/08/2018 (Approximate)   Wt Readings from Last 3 Encounters:  07/28/18 (!) 317 lb (143.8 kg)  07/28/18 (!) 312 lb 4.8 oz (141.7 kg)  07/21/18 (!) 311 lb (141.1 kg)   Temp Readings from Last 3 Encounters:  07/28/18 98.2 F (36.8 C) (Oral)  07/28/18 98.2 F (36.8 C) ((P) Oral)  06/28/18 98.2 F (36.8 C) (Oral)   BP Readings from Last 3 Encounters:  07/28/18 114/63  07/28/18 (!) 151/87  07/21/18 132/64   Pulse Readings from Last 3 Encounters:  07/28/18 86  07/28/18 (!) 105  06/28/18 (!) 104     Well nourished, well developed female in no acute distress. Constitutional:  oriented to person, place, and time. No distress.  Head: Normocephalic and atraumatic.  Eyes:  no discharge. No scleral icterus.  Neck: Normal range of motion. Neck supple.  Pulmonary/Chest: No audible wheezing, no distress, appears comfortable Musculoskeletal: Normal range of motion.  no  tenderness or deformity.  Neurological:   Coordination normal. Full exam not performed Skin:  No rash Psychiatric:  normal mood and affect. behavior is normal. Thought content normal.    ASSESSMENT & PLAN:    No diagnosis found.   COVID-19 Education: The signs and symptoms of COVID-19 were discussed with the patient and how to seek care for testing (follow up with PCP or  arrange E-visit).  The importance of social distancing was discussed today.  Patient Risk:   After full review of this patients clinical status, I feel that they are at least moderate risk at this time.  Time:   Today, I have spent 25 minutes with the patient with telehealth technology discussing the cardiac and medical problems/diagnoses detailed above   10 min spent reviewing the chart prior to patient visit today   Medication Adjustments/Labs and Tests Ordered: Current medicines are reviewed at length with the patient today.  Concerns regarding medicines are outlined above.   Tests Ordered: No tests ordered   Medication Changes: No changes made   Disposition: Follow-up in 6 months   Signed, Julien Nordmannimothy Gollan, MD  07/28/2018 5:55 PM    [facility]  Office [address], [place], [place] [address]

## 2018-07-29 ENCOUNTER — Telehealth: Payer: Medicaid Other | Admitting: Cardiovascular Disease

## 2018-07-31 ENCOUNTER — Telehealth (INDEPENDENT_AMBULATORY_CARE_PROVIDER_SITE_OTHER): Payer: Medicaid Other | Admitting: Cardiovascular Disease

## 2018-07-31 ENCOUNTER — Other Ambulatory Visit: Payer: Self-pay

## 2018-07-31 DIAGNOSIS — Q211 Atrial septal defect, unspecified: Secondary | ICD-10-CM

## 2018-07-31 DIAGNOSIS — O10019 Pre-existing essential hypertension complicating pregnancy, unspecified trimester: Secondary | ICD-10-CM | POA: Diagnosis not present

## 2018-07-31 NOTE — Patient Instructions (Addendum)
Images were reviewed personally by myself Possible small PFO, no further work-up needed prior to delivery, no anticoagulation needed   Medication Instructions:  No changes  If you need a refill on your cardiac medications before your next appointment, please call your pharmacy.    Lab work: No new labs needed   If you have labs (blood work) drawn today and your tests are completely normal, you will receive your results only by: Marland Kitchen MyChart Message (if you have MyChart) OR . A paper copy in the mail If you have any lab test that is abnormal or we need to change your treatment, we will call you to review the results.   Testing/Procedures: No new testing needed   Follow-Up: At [facility], you and your health needs are our priority.  As part of our continuing mission to provide you with exceptional heart care, we have created designated Provider Care Teams.  These Care Teams include your primary Cardiologist (physician) and Advanced Practice Providers (APPs -  Physician Assistants and Nurse Practitioners) who all work together to provide you with the care you need, when you need it.  . You will need a follow up appointment as needed  . Providers on your designated Care Team:   . Nicolasa Ducking, NP . Eula Listen, PA-C . Marisue Ivan, PA-C  Any Other Special Instructions Will Be Listed Below (If Applicable).  For educational health videos Log in to : [email or web address] Or : [email or web address], password : [email or web address] \

## 2018-07-31 NOTE — Progress Notes (Signed)
Virtual Visit via Video Note   This visit type was conducted due to national recommendations for restrictions regarding the COVID-19 Pandemic (e.g. social distancing) in an effort to limit this patient's exposure and mitigate transmission in our community.  Due to her co-morbid illnesses, this patient is at least at moderate risk for complications without adequate follow up.  This format is felt to be most appropriate for this patient at this time.  All issues noted in this document were discussed and addressed.  A limited physical exam was performed with this format.  Please refer to the patient's chart for her consent to telehealth for [facility].   I connected with  Ashley Howell on 07/31/18 by a video enabled telemedicine application and verified that I am speaking with the correct person using two identifiers. I discussed the limitations of evaluation and management by telemedicine. The patient expressed understanding and agreed to proceed.   Evaluation Performed:  Follow-up visit  Date:  07/31/2018   ID:  Ashley Howell, DOB Jun 12, 1990, MRN [id number]  Patient Location:  [address] [place] [address]   Provider location:   [facility] [place], [employer] office  PCP:  [facility]  Cardiologist:  [facility]bard Robinson [facility]   Chief Complaint: Hypertension in pregnancy, history of PFO  New patient  History of Present Illness:    Ashley Howell is a 28 y.o. female who presents via audio/video conferencing for a telehealth visit today.   The patient does not symptoms concerning for COVID-19 infection (fever, chills, cough, or new SHORTNESS OF BREATH).   28 y.o. female [phone] at [redacted]w[redacted]d  Patient has a past medical history of gestational hypertension and is currently taking Labetalol 100 mg BID Referred for PFO/ASD  Appears echocardiogram has been recently ordered but not completed Prior echocardiogram 2016 with conflicting  details Details a PFO but also details possible ASD with shunting  In the hospital July 28, 2022 hypertension Follow-up pressures were normal, she had a mild headache  She has 3 children  9, 8 3, yo children Reports relatively uncomplicated pregnancies All by C-sections  At baseline no significant shortness of breath or chest pain with exertion labetolol holding the pressures No swelling Has only required medication for blood pressure this pregnancy   Prior CV studies:   The following studies were reviewed today:  Echocardiogram 12/2014, results discussed with her in detail - Left ventricle: The cavity size was normal. Systolic function was   normal. The estimated ejection fraction was in the range of 55%   to 60%. Wall motion was normal; there were no regional wall   motion abnormalities. Left ventricular diastolic function   parameters were normal. - Aortic valve: Valve area (Vmax): 2.49 cm^2. - Left atrium: The atrium was mildly dilated. - Atrial septum: There was a patent foramen ovale. Echo contrast   study showed no right-to-left atrial level shunt, at baseline or   with provocation.  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  Read by outside cardiologist  Past Medical History:  Diagnosis Date  . Anxiety   . GERD (gastroesophageal reflux disease)   . Heart defect    ASD  . Shortness of breath dyspnea    Past Surgical History:  Procedure Laterality Date  . CESAREAN SECTION  10/28/2008  . CESAREAN SECTION N/A 02/22/2015   Procedure: CESAREAN SECTION;  Surgeon: Nadara Mustard, MD;  Location:  [facility] ORS;  Service: Obstetrics;  Laterality: N/A;  . CESAREAN SECTION  04/13/2010     No outpatient medications have been marked as taking for the 07/31/18 encounter (Appointment) with Antonieta IbaGollan, Timothy J, MD.     Allergies:   Patient has no known allergies.   Social History   Tobacco Use  . Smoking status: Current Every  Day Smoker    Packs/day: 0.25    Types: Cigarettes    Last attempt to quit: 05/23/2014    Years since quitting: 4.1  . Smokeless tobacco: Never Used  Substance Use Topics  . Alcohol use: No  . Drug use: No     Current Outpatient Medications on File Prior to Visit  Medication Sig Dispense Refill  . aspirin 81 MG chewable tablet Chew 81 mg by mouth daily.    Marland Kitchen. labetalol (NORMODYNE) 100 MG tablet Take 100 mg by mouth 2 (two) times daily.    . Prenatal Vit-Fe Fumarate-FA (PRENATAL MULTIVITAMIN) TABS tablet Take 1 tablet by mouth daily at 12 noon.     No current facility-administered medications on file prior to visit.      Family Hx: The patient's family history includes Cancer in her maternal grandfather; Hypertension in her mother; Stroke in her paternal aunt.  ROS:   Please see the history of present illness.    Review of Systems  Constitutional: Negative.   Respiratory: Negative.   Cardiovascular: Negative.   Gastrointestinal: Negative.   Musculoskeletal: Negative.   Neurological: Negative.   Psychiatric/Behavioral: Negative.   All other systems reviewed and are negative.     Labs/Other Tests and Data Reviewed:    Recent Labs: 07/28/2018: ALT 15; BUN 6; Creatinine, Ser 0.40; Hemoglobin 10.9; Platelets 364; Potassium 3.6; Sodium 134   Recent Lipid Panel No results found for: CHOL, TRIG, HDL, CHOLHDL, LDLCALC, LDLDIRECT  Wt Readings from Last 3 Encounters:  07/28/18 (!) 317 lb (143.8 kg)  07/28/18 (!) 312 lb 4.8 oz (141.7 kg)  07/21/18 (!) 311 lb (141.1 kg)     Exam:    Vital Signs: Vital signs may also be detailed in the HPI LMP 01/08/2018 (Approximate)   Wt Readings from Last 3 Encounters:  07/28/18 (!) 317 lb (143.8 kg)  07/28/18 (!) 312 lb 4.8 oz (141.7 kg)  07/21/18 (!) 311 lb (141.1 kg)   Temp Readings from Last 3 Encounters:  07/28/18 98.2 F (36.8 C) (Oral)  07/28/18 98.2 F (36.8 C) ((P) Oral)  06/28/18 98.2 F (36.8 C) (Oral)   BP Readings  from Last 3 Encounters:  07/28/18 114/63  07/28/18 (!) 151/87  07/21/18 132/64   Pulse Readings from Last 3 Encounters:  07/28/18 86  07/28/18 (!) 105  06/28/18 (!) 104    Pulse 110 over 60s, pulse 80s, respirations 16  Well nourished, well developed female in no acute distress. Constitutional:  oriented to person, place, and time. No distress.  Head: Normocephalic and atraumatic.  Eyes:  no discharge. No scleral icterus.  Neck: Normal range of motion. Neck supple.  Pulmonary/Chest: No audible wheezing, no distress, appears comfortable Musculoskeletal: Normal range of motion.  no  tenderness or deformity.  Neurological:   Coordination normal. Full exam not performed Skin:  No rash Psychiatric:  normal mood and affect. behavior is normal. Thought content normal.    ASSESSMENT & PLAN:    Patent foramen ovale/PFO Conflicting reports detailed on prior echocardiogram 2016 by outside cardiologist, listing PFO and ASD We have obtained the images and I reviewed them personally  myself -Minimal shunt if any noted indicating likely small PFO -Repeat study not indicated at this time No further work-up needed prior to delivery No anticoagulation needed  Benign essential hypertension, antepartum Blood pressure is well controlled on today's visit. No changes made to the medications. Reports not requiring labetalol before this pregnancy  Pre-existing essential hypertension during pregnancy, antepartum Recent evaluation for hypertension in the setting of headache Further follow-up, blood pressure was stable 110 systolic   COVID-19 Education: The signs and symptoms of COVID-19 were discussed with the patient and how to seek care for testing (follow up with PCP or arrange E-visit).  The importance of social distancing was discussed today.  Patient Risk:   After full review of this patients clinical status, I feel that they are at least moderate risk at this time.  Time:   Today, I have  spent 25 minutes with the patient with telehealth technology discussing the cardiac and medical problems/diagnoses detailed above   10 min spent reviewing the chart prior to patient visit today   Medication Adjustments/Labs and Tests Ordered: Current medicines are reviewed at length with the patient today.  Concerns regarding medicines are outlined above.   Tests Ordered: No tests ordered   Medication Changes: No changes made   Disposition: Follow-up as needed   Signed, Julien Nordmann, MD  07/31/2018 7:16 AM    [facility] [address], [place], [place] [address]

## 2018-07-31 NOTE — Progress Notes (Signed)
Patient called back and reviewed information and recommendations by provider. She was appreciative for the information with no further questions at this time.

## 2018-07-31 NOTE — Progress Notes (Signed)
Left voicemail message that I would call back to review providers recommendations based on images that he reviewed. Per provider there is a very small PFO and no repeat echocardiogram needed at this time. He also stated no further work up needed before delivery and no need for blood thinners. Advised that I would try to call back after lunch to review this information with her and that it may show up as no caller ID or private number.

## 2018-08-12 ENCOUNTER — Other Ambulatory Visit: Payer: Self-pay | Admitting: Advanced Practice Midwife

## 2018-08-12 ENCOUNTER — Telehealth: Payer: Self-pay

## 2018-08-12 DIAGNOSIS — O10019 Pre-existing essential hypertension complicating pregnancy, unspecified trimester: Secondary | ICD-10-CM

## 2018-08-12 MED ORDER — LABETALOL HCL 100 MG PO TABS: 100.0000 mg | ORAL_TABLET | Freq: Two times a day (BID) | ORAL | 4 refills | Status: DC

## 2018-08-12 NOTE — Telephone Encounter (Signed)
Pt called triage line stating she needs a refill on her B/P medication. 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 She has an appointment with JEG on 5/13 and was wondering if we could refill it?  [facility] [address] CB# [phone]

## 2018-08-12 NOTE — Progress Notes (Signed)
Refill of Labetalol Rx sent to patient pharmacy.

## 2018-08-12 NOTE — Telephone Encounter (Signed)
Spoke with patient to tell her Rx Labetalol was sent to her pharmacy.

## 2018-08-20 ENCOUNTER — Encounter: Payer: Self-pay | Admitting: Advanced Practice Midwife

## 2018-08-20 ENCOUNTER — Ambulatory Visit (INDEPENDENT_AMBULATORY_CARE_PROVIDER_SITE_OTHER): Payer: Medicaid Other | Admitting: Advanced Practice Midwife

## 2018-08-20 ENCOUNTER — Other Ambulatory Visit: Payer: Self-pay

## 2018-08-20 VITALS — BP 136/80 | Wt 312.0 lb

## 2018-08-20 DIAGNOSIS — Z3A32 32 weeks gestation of pregnancy: Secondary | ICD-10-CM

## 2018-08-20 DIAGNOSIS — O0990 Supervision of high risk pregnancy, unspecified, unspecified trimester: Secondary | ICD-10-CM

## 2018-08-20 DIAGNOSIS — O163 Unspecified maternal hypertension, third trimester: Secondary | ICD-10-CM

## 2018-08-20 NOTE — Progress Notes (Signed)
No vb. No lof.  

## 2018-08-20 NOTE — Progress Notes (Signed)
  Routine Prenatal Care Visit  Subjective  Ashley Howell is a 28 y.o. [phone] at [redacted]w[redacted]d being seen today for ongoing prenatal care.  She is currently monitored for the following issues for this high-risk pregnancy and has First trimester screening; History of cardiomegaly; Atrial septal defect; Previous cesarean delivery, antepartum condition or complication; Obesity in pregnancy, antepartum; S/P cesarean section; Benign essential hypertension, antepartum; Hypertension in pregnancy, antepartum; PFO (patent foramen ovale); Supervision of high risk pregnancy, antepartum; Pregnancy with abdominal pain of lower quadrant, antepartum; and Elevated blood pressure affecting pregnancy in third trimester, antepartum on their problem list.  ----------------------------------------------------------------------------------- Patient reports some occasional bilateral cramping.  She denies headaches, visual changes, epigastric pain. She is taking her Labetalol as prescribed- 100 mg BID.  Contractions: Not present. Vag. Bleeding: None.  Movement: Present. Denies leaking of fluid.  ----------------------------------------------------------------------------------- The following portions of the patient's history were reviewed and updated as appropriate: allergies, current medications, past family history, past medical history, past social history, past surgical history and problem list. Problem list updated.   Objective  Blood pressure 136/80, weight (!) 312 lb (141.5 kg), last menstrual period 01/08/2018. Pregravid weight 275 lb (124.7 kg) Total Weight Gain 37 lb (16.8 kg) Urinalysis: Urine Protein    Urine Glucose    Fetal Status: Fetal Heart Rate (bpm): 138 Fundal Height: 32 cm Movement: Present     General:  Alert, oriented and cooperative. Patient is in no acute distress.  Skin: Skin is warm and dry. No rash noted.   Cardiovascular: Normal heart rate noted  Respiratory: Normal respiratory effort, no  problems with respiration noted  Abdomen: Soft, gravid, appropriate for gestational age.       Pelvic:  Cervical exam deferred        Extremities: Normal range of motion.     Mental Status: Normal mood and affect. Normal behavior. Normal judgment and thought content.   Assessment   28 y.o. [id number] at [redacted]w[redacted]d by  10/15/2018, by Ultrasound presenting for routine prenatal visit  Plan   Pregnancy #5 Problems (from 04/07/18 to present)    No problems associated with this episode.       Preterm labor symptoms and general obstetric precautions including but not limited to vaginal bleeding, contractions, leaking of fluid and fetal movement were reviewed in detail with the patient.  MFM growth scan in 5 days Tubal consent signed today Repeat c/s with tubal scheduled for 10/09/2018 with Sch[facility]    Return in about 2 weeks (around 09/03/2018) for nst and rob.  [name], CNM 08/20/2018 10:43 AM

## 2018-08-21 ENCOUNTER — Other Ambulatory Visit: Payer: Self-pay

## 2018-08-21 ENCOUNTER — Observation Stay
Admission: EM | Admit: 2018-08-21 | Discharge: 2018-08-22 | Disposition: A | Discharge status: Home / Self Care | Payer: Medicaid Other | Attending: Obstetrics & Gynecology | Admitting: Obstetrics & Gynecology

## 2018-08-21 DIAGNOSIS — O99213 Obesity complicating pregnancy, third trimester: Secondary | ICD-10-CM | POA: Diagnosis not present

## 2018-08-21 DIAGNOSIS — R1011 Right upper quadrant pain: Secondary | ICD-10-CM | POA: Insufficient documentation

## 2018-08-21 DIAGNOSIS — F17210 Nicotine dependence, cigarettes, uncomplicated: Secondary | ICD-10-CM | POA: Insufficient documentation

## 2018-08-21 DIAGNOSIS — Z3A32 32 weeks gestation of pregnancy: Secondary | ICD-10-CM | POA: Insufficient documentation

## 2018-08-21 DIAGNOSIS — O0990 Supervision of high risk pregnancy, unspecified, unspecified trimester: Secondary | ICD-10-CM

## 2018-08-21 DIAGNOSIS — O99333 Smoking (tobacco) complicating pregnancy, third trimester: Secondary | ICD-10-CM | POA: Diagnosis not present

## 2018-08-21 DIAGNOSIS — O368130 Decreased fetal movements, third trimester, not applicable or unspecified: Secondary | ICD-10-CM | POA: Diagnosis not present

## 2018-08-21 DIAGNOSIS — O26893 Other specified pregnancy related conditions, third trimester: Secondary | ICD-10-CM | POA: Diagnosis not present

## 2018-08-21 LAB — COMPREHENSIVE METABOLIC PANEL
ALT: 14 U/L (ref 0–44)
AST: 14 U/L — ABNORMAL LOW (ref 15–41)
Albumin: 3.1 g/dL — ABNORMAL LOW (ref 3.5–5.0)
Alkaline Phosphatase: 74 U/L (ref 38–126)
Anion gap: 8 (ref 5–15)
BUN: 8 mg/dL (ref 6–20)
CO2: 21 mmol/L — ABNORMAL LOW (ref 22–32)
Calcium: 9.2 mg/dL (ref 8.9–10.3)
Chloride: 105 mmol/L (ref 98–111)
Creatinine, Ser: 0.45 mg/dL (ref 0.44–1.00)
GFR calc Af Amer: >60 mL/min (ref >60)
GFR calc non Af Amer: >60 mL/min (ref >60)
Glucose, Bld: 90 mg/dL (ref 70–99)
Potassium: 3.8 mmol/L (ref 3.5–5.1)
Sodium: 134 mmol/L — ABNORMAL LOW (ref 135–145)
Total Bilirubin: <0.1 mg/dL — ABNORMAL LOW (ref 0.3–1.2)
Total Protein: 7.1 g/dL (ref 6.5–8.1)

## 2018-08-21 LAB — CBC
HCT: 34.4 % — ABNORMAL LOW (ref 36.0–46.0)
Hemoglobin: 11 g/dL — ABNORMAL LOW (ref 12.0–15.0)
MCH: 26.1 pg (ref 26.0–34.0)
MCHC: 32 g/dL (ref 30.0–36.0)
MCV: 81.7 fL (ref 80.0–100.0)
Platelets: 378 K/uL (ref 150–400)
RBC: 4.21 MIL/uL (ref 3.87–5.11)
RDW: 13.2 % (ref 11.5–15.5)
WBC: 14.3 K/uL — ABNORMAL HIGH (ref 4.0–10.5)
nRBC: 0 % (ref 0.0–0.2)

## 2018-08-21 MED ORDER — OXYTOCIN BOLUS FROM INFUSION: 500.0000 mL | Freq: Once | INTRAVENOUS | Status: DC

## 2018-08-21 MED ORDER — OXYTOCIN 40 UNITS IN NORMAL SALINE INFUSION - SIMPLE MED: 2.5000 [IU]/h | INTRAVENOUS | Status: DC

## 2018-08-21 MED ORDER — LACTATED RINGERS IV SOLN: INTRAVENOUS | Status: DC

## 2018-08-21 MED ORDER — LACTATED RINGERS IV SOLN: 500.0000 mL | INTRAVENOUS | Status: DC | PRN

## 2018-08-21 MED ORDER — ONDANSETRON HCL 4 MG/2ML IJ SOLN: 4.0000 mg | Freq: Four times a day (QID) | INTRAMUSCULAR | Status: DC | PRN

## 2018-08-21 MED ORDER — ACETAMINOPHEN 325 MG PO TABS: 650.0000 mg | ORAL_TABLET | ORAL | Status: DC | PRN

## 2018-08-21 MED ORDER — LIDOCAINE HCL (PF) 1 % IJ SOLN: 30.0000 mL | INTRAMUSCULAR | Status: DC | PRN

## 2018-08-21 MED ORDER — LABETALOL HCL 100 MG PO TABS
100.0000 mg | ORAL_TABLET | Freq: Two times a day (BID) | ORAL | Status: DC
  Administered 2018-08-21: 23:00:00 100 mg via ORAL
  Filled 2018-08-21: qty 1

## 2018-08-21 NOTE — OB Triage Note (Signed)
Pt arrival to triage with c/o abdominal cramping starting this AM.  Pt also states decreased fetal movement since last evening.  Pt now states positive fetal movement.  Pt denies vaginal bleeding and LOF.  EFM and toco applied and assessing.

## 2018-08-22 ENCOUNTER — Observation Stay: Payer: Medicaid Other

## 2018-08-22 ENCOUNTER — Telehealth: Payer: Self-pay | Admitting: Obstetrics and Gynecology

## 2018-08-22 DIAGNOSIS — O26893 Other specified pregnancy related conditions, third trimester: Secondary | ICD-10-CM | POA: Diagnosis not present

## 2018-08-22 DIAGNOSIS — Z3A32 32 weeks gestation of pregnancy: Secondary | ICD-10-CM | POA: Diagnosis not present

## 2018-08-22 DIAGNOSIS — O368130 Decreased fetal movements, third trimester, not applicable or unspecified: Secondary | ICD-10-CM | POA: Diagnosis not present

## 2018-08-22 DIAGNOSIS — R1011 Right upper quadrant pain: Secondary | ICD-10-CM | POA: Diagnosis not present

## 2018-08-22 NOTE — OB Triage Note (Signed)
Discharge instructions provided and reviewed.  Pt verbalized understanding.

## 2018-08-22 NOTE — Telephone Encounter (Signed)
Lmtrc

## 2018-08-22 NOTE — Discharge Summary (Signed)
  See FPN 

## 2018-08-22 NOTE — Final Progress Note (Signed)
Physician Final Progress Note  Patient ID: Ashley JunesSamantha S [place] MRN: [id number] DOB/AGE: 28/04/1990 27 y.o.  Admit date: 08/21/2018 Admitting provider: Nadara Mustardobert P Harris, MD Discharge date: 08/22/2018  Admission Diagnoses: 32 weeks, RUQ pain, Decreased fetal movement  Discharge Diagnoses:  Active Problems:   RUQ pain   Decreased fetal movement  Consults: None  Significant Findings/ Diagnostic Studies:  Subjective:   Ashley Howell is a 28 y.o. female. She is at 7877w2d gestation. She has noted decreased fetal movement for the last a few hours.  She also reports pain that is sharp and shooting in the RUQ and radiates to right side, no nausea or assoc sx's and no modifiers; not associated w eating as far as making worse or better. Good FM. No VB or ROM or Ctx pains. She denies headache, heartburn, vomiting and bleeding, contractions, cramping or leaking. Her pregnancy has been complicated by: obesity BMI >40, STD  ROS: A review of systems was performed and negative, except as stated in the above HPI.  OBGYN History: As per HPI. OB History    Gravida  5   Para  3   Term  3   Preterm      AB  1   Living  3     SAB      TAB  1   Ectopic      Multiple  0   Live Births  3            Past Medical History: Past Medical History:  Diagnosis Date  . Anxiety   . GERD (gastroesophageal reflux disease)   . Heart defect    ASD  . Shortness of breath dyspnea     Past Surgical History: Past Surgical History:  Procedure Laterality Date  . CESAREAN SECTION  10/28/2008  . CESAREAN SECTION N/A 02/22/2015   Procedure: CESAREAN SECTION;  Surgeon: Nadara Mustardobert P Harris, MD;  Location: [facility] ORS;  Service: Obstetrics;  Laterality: N/A;  . CESAREAN SECTION  04/13/2010    Family History:  Family History  Problem Relation Age of Onset  . Hypertension Mother   . Stroke Paternal Aunt   . Cancer Maternal Grandfather        Colon   She denies any female cancers, bleeding or  blood clotting disorders.   Social History:  Social History   Socioeconomic History  . Marital status: Single    Spouse name: Not on file  . Number of children: 3  . Years of education: Not on file  . Highest education level: Not on file  Occupational History  . Occupation: CNA  Social Needs  . Financial resource strain: Not hard at all  . Food insecurity:    Worry: Never true    Inability: Never true  . Transportation needs:    Medical: No    Non-medical: No  Tobacco Use  . Smoking status: Current Every Day Smoker    Packs/day: 0.25    Types: Cigarettes    Last attempt to quit: 05/23/2014    Years since quitting: 4.2  . Smokeless tobacco: Never Used  Substance and Sexual Activity  . Alcohol use: No  . Drug use: No  . Sexual activity: Yes    Birth control/protection: Surgical    Comment: BTL  Lifestyle  . Physical activity:    Days per week: 4 days    Minutes per session: 30 min  . Stress: Not at all  Relationships  . Social connections:    Talks  on phone: Three times a week    Gets together: Twice a week    Attends religious service: 1 to 4 times per year    Active member of club or organization: No    Attends meetings of clubs or organizations: Never    Relationship status: Living with partner  . Intimate partner violence:    Fear of current or ex partner: No    Emotionally abused: No    Physically abused: No    Forced sexual activity: No  Other Topics Concern  . Not on file  Social History Narrative  . Not on file   Allergy: No Known Allergies  Medications:  No medications prior to admission.  PNV  Objective:   Vitals:   08/22/18 0234 08/22/18 0330  BP: (!) 124/58 (!) 139/58  Pulse: 98 100  Resp:  16  Temp:  98.5 F (36.9 C)   Constitutional: Well nourished, well developed female in no acute distress.  HEENT: normal Skin: Warm and dry.  Cardiovascular:Regular rate and rhythm.   Extremity: trace to 1+ bilateral pedal edema Respiratory:  Clear to auscultation bilateral. Normal respiratory effort Abdomen: mild RUQ T and +mild murphys, without guarding, without rebound    Gravid, FHT 140s Back: no CVAT Neuro: DTRs 2+, Cranial nerves grossly intact Psych: Alert and Oriented x3. No memory deficits. Normal mood and affect.  MS: normal gait, normal bilateral lower extremity ROM/strength/stability.  External fetal monitoring: 140s, accels  Results for orders placed or performed during the hospital encounter of 08/21/18 (from the past 48 hour(s))  Comprehensive metabolic panel   Collection Time: 08/21/18 10:25 PM  Result Value Ref Range   Sodium 134 (L) 135 - 145 mmol/L   Potassium 3.8 3.5 - 5.1 mmol/L   Chloride 105 98 - 111 mmol/L   CO2 21 (L) 22 - 32 mmol/L   Glucose, Bld 90 70 - 99 mg/dL   BUN 8 6 - 20 mg/dL   Creatinine, Ser 1.61 0.44 - 1.00 mg/dL   Calcium 9.2 8.9 - 09.6 mg/dL   Total Protein 7.1 6.5 - 8.1 g/dL   Albumin 3.1 (L) 3.5 - 5.0 g/dL   AST 14 (L) 15 - 41 U/L   ALT 14 0 - 44 U/L   Alkaline Phosphatase 74 38 - 126 U/L   Total Bilirubin <0.1 (L) 0.3 - 1.2 mg/dL   GFR calc non Af Amer >60 >60 mL/min   GFR calc Af Amer >60 >60 mL/min   Anion gap 8 5 - 15  CBC   Collection Time: 08/21/18 10:25 PM  Result Value Ref Range   WBC 14.3 (H) 4.0 - 10.5 K/uL   RBC 4.21 3.87 - 5.11 MIL/uL   Hemoglobin 11.0 (L) 12.0 - 15.0 g/dL   HCT 04.5 (L) 40.9 - 81.1 %   MCV 81.7 80.0 - 100.0 fL   MCH 26.1 26.0 - 34.0 pg   MCHC 32.0 30.0 - 36.0 g/dL   RDW 91.4 78.2 - 95.6 %   Platelets 378 150 - 400 K/uL   nRBC 0.0 0.0 - 0.2 %    [place] of abd, no sign of cholestasus or stones  A NST procedure was performed with FHR monitoring and a normal baseline established, appropriate time of 20-40 minutes of evaluation, and accels >2 seen w 15x15 characteristics.  Results show a REACTIVE NST.   Assessment:   Pregnancy at [redacted]w[redacted]d with concerns for decreased fetal movement and RUQ pain No signs of gall bladder disorder  Plan:    See  labs and ultrasound  Fetal wellbeing reassuring Heat rest tylenol Patient expresses understanding of information provided and plan of care.    Procedures: NST, Ultrasound  Discharge Condition: good  Disposition: Discharge disposition: 01-Home or Self Care       Diet: Regular diet  Discharge Activity: Activity as tolerated   Allergies as of 08/22/2018   No Known Allergies     Medication List    TAKE these medications   aspirin 81 MG chewable tablet Chew 81 mg by mouth daily.   labetalol 100 MG tablet Commonly known as:  NORMODYNE Take 1 tablet (100 mg total) by mouth 2 (two) times daily.   prenatal multivitamin Tabs tablet Take 1 tablet by mouth daily at 12 noon.      Follow-up Information    [facility]. Go to.   Why:  Keep regularly scheduled appointments Contact information: [address] [place] [address] [phone]          Total time spent taking care of this patient: 25 minutes  Signed: Letitia Libra 08/22/2018, 7:28 AM

## 2018-08-22 NOTE — Telephone Encounter (Signed)
Patient is aware of H&P at [facility] on 10/06/18 @ 9:10am w/ Dr. Jerene Pitch, Pre-admit Testing and COVID testing to be scheduled, and OR on 10/09/18. Patient is aware of check-in/ mask/ no visitors for [facility] visit.

## 2018-08-22 NOTE — Telephone Encounter (Signed)
-----   Message from [name], CNM sent at 08/20/2018 10:56 AM EDT ----- Regarding: c/s scheduling Surgery Booking Request Patient Full Name:  Ashley Howell  MRN: [id number]  DOB: [birth year]  Surgeon: Jerene Pitch Requested Surgery Date and Time: 10/09/2018 Primary Diagnosis AND Code: Repeat cesarean section Secondary Diagnosis and Code:  Surgical Procedure: Cesarean section with tubal ligation L&D Notification: Yes Admission Status: surgery admit Length of Surgery: 60 m Special Case Needs: on q pump H&P:  (date) Phone Interview???:  Interpreter: Language:  Medical Clearance:  Special Scheduling Instructions:

## 2018-08-25 ENCOUNTER — Ambulatory Visit
Admission: RE | Admit: 2018-08-25 | Discharge: 2018-08-25 | Disposition: A | Discharge status: Home / Self Care | Payer: Medicaid Other | Source: Ambulatory Visit | Attending: Maternal & Fetal Medicine | Admitting: Maternal & Fetal Medicine

## 2018-08-25 ENCOUNTER — Other Ambulatory Visit: Payer: Self-pay

## 2018-08-25 DIAGNOSIS — O163 Unspecified maternal hypertension, third trimester: Secondary | ICD-10-CM | POA: Insufficient documentation

## 2018-08-25 DIAGNOSIS — O0993 Supervision of high risk pregnancy, unspecified, third trimester: Secondary | ICD-10-CM | POA: Diagnosis not present

## 2018-08-25 DIAGNOSIS — Z79899 Other long term (current) drug therapy: Secondary | ICD-10-CM | POA: Diagnosis not present

## 2018-08-25 DIAGNOSIS — Z3A32 32 weeks gestation of pregnancy: Secondary | ICD-10-CM | POA: Diagnosis not present

## 2018-08-25 DIAGNOSIS — O0990 Supervision of high risk pregnancy, unspecified, unspecified trimester: Secondary | ICD-10-CM

## 2018-09-02 ENCOUNTER — Other Ambulatory Visit: Payer: Self-pay

## 2018-09-02 ENCOUNTER — Ambulatory Visit (INDEPENDENT_AMBULATORY_CARE_PROVIDER_SITE_OTHER): Payer: Medicaid Other | Admitting: Advanced Practice Midwife

## 2018-09-02 ENCOUNTER — Encounter: Payer: Self-pay | Admitting: Advanced Practice Midwife

## 2018-09-02 VITALS — BP 124/72 | Wt 311.0 lb

## 2018-09-02 DIAGNOSIS — Z23 Encounter for immunization: Secondary | ICD-10-CM

## 2018-09-02 DIAGNOSIS — O163 Unspecified maternal hypertension, third trimester: Secondary | ICD-10-CM | POA: Diagnosis not present

## 2018-09-02 DIAGNOSIS — O0990 Supervision of high risk pregnancy, unspecified, unspecified trimester: Secondary | ICD-10-CM

## 2018-09-02 DIAGNOSIS — Z3A33 33 weeks gestation of pregnancy: Secondary | ICD-10-CM

## 2018-09-02 NOTE — Patient Instructions (Signed)
Nonstress Test °A nonstress test is a procedure that is done during pregnancy in order to check the baby's heartbeat. The procedure can help show if the baby (fetus) is healthy. It is commonly done when: °· The baby is past his or her due date. °· The pregnancy is high risk. °· The baby is moving less than normal. °· The mother has lost a pregnancy in the past. °· The health care provider suspects a problem with the baby's growth. °· There is too much or too little amniotic fluid. °The procedure is often done in the third trimester of pregnancy to find out if an early delivery is needed and whether such a delivery is safe. °During a nonstress test, the baby's heartbeat is monitored when the baby is resting and when the baby is moving. If the baby is healthy, the heart rate will increase when he or she moves or kicks and will return to normal when he or she rests. °Tell a health care provider about: °· Any allergies you have. °· Any medical conditions you have. °· All medicines you are taking, including vitamins, herbs, eye drops, creams, and over-the-counter medicines. °What are the risks? °There are no risks to you or your baby from a nonstress test. This procedure should not be painful or uncomfortable. °What happens before the procedure? °· Eat a meal right before the test or as directed by your health care provider. Food may help encourage the baby to move. °· Use the restroom right before the test. °What happens during the procedure? °· Two monitors will be placed on your abdomen. One will record the baby's heart rate and the other will record the contractions of your uterus. °· You may be asked to lie down on your side or to sit upright. °· You may be given a button to press when you feel your baby move. °· Your health care provider will listen to your baby's heartbeat and recorded it. He or she may also watch your baby's heartbeat on a screen. °· If the baby seems to be sleeping, you may be asked to drink  some juice or soda, eat a snack, or change positions. °The procedure may vary among health care providers and hospitals. °What happens after the procedure? °· Your health care provider will discuss the test results with you and make recommendations for the future. Depending on the results, your health care provider may order additional tests or another course of action. °· If your health care provider gave you any diet or activity instructions, make sure to follow them. °· Keep all follow-up visits as told by your health care provider. This is important. °Summary °· A nonstress test is a procedure that is done during pregnancy in order to check the baby's heartbeat. The procedure can help show if the baby is healthy. °· The procedure is often done in the third trimester of pregnancy to find out if an early delivery is needed and whether such a delivery is safe. °· During a nonstress test, the baby's heartbeat is monitored when the baby is resting and when the baby is moving. If the baby is healthy, the heart rate will increase when he or she moves or kicks and will return to normal when he or she rests. °· Your health care provider will discuss the test results with you and make recommendations for the future. °This information is not intended to replace advice given to you by your health care provider. Make sure you discuss any   questions you have with your health care provider. °Document Released: 03/16/2002 Document Revised: 07/05/2016 Document Reviewed: 07/05/2016 °Elsevier Interactive Patient Education © 2019 [employer] ° °

## 2018-09-02 NOTE — Addendum Note (Signed)
Addended by: Kathlene Cote on: 09/02/2018 03:01 PM   Modules accepted: Orders

## 2018-09-02 NOTE — Progress Notes (Signed)
NST today. No complaints. TDAP, 34 wk instructions, FKC's today.

## 2018-09-02 NOTE — Progress Notes (Signed)
  Routine Prenatal Care Visit  Subjective  Ashley Howell is a 28 y.o. [phone] at [redacted]w[redacted]d being seen today for ongoing prenatal care.  She is currently monitored for the following issues for this high-risk pregnancy and has First trimester screening; History of cardiomegaly; Atrial septal defect; Previous cesarean delivery, antepartum condition or complication; Obesity in pregnancy, antepartum; S/P cesarean section; Benign essential hypertension, antepartum; Hypertension in pregnancy, antepartum; PFO (patent foramen ovale); Supervision of high risk pregnancy, antepartum; Pregnancy with abdominal pain of lower quadrant, antepartum; Elevated blood pressure affecting pregnancy in third trimester, antepartum; and RUQ pain on their problem list.  ----------------------------------------------------------------------------------- Patient reports some nausea and vomiting especially when she eats a large meal. She is encouraged to eat smaller, more frequent meals. She is doing well taking her Labetalol.   Contractions: Not present. Vag. Bleeding: None.  Movement: Present. Denies leaking of fluid.  ----------------------------------------------------------------------------------- The following portions of the patient's history were reviewed and updated as appropriate: allergies, current medications, past family history, past medical history, past social history, past surgical history and problem list. Problem list updated.   Objective  Blood pressure 124/72, weight (!) 311 lb (141.1 kg), last menstrual period 01/08/2018. Pregravid weight 275 lb (124.7 kg) Total Weight Gain 36 lb (16.3 kg) Urinalysis: Urine Protein    Urine Glucose    Fetal Status: Fetal Heart Rate (bpm): 130   Movement: Present     NST reactive today: 20 minute tracing, baseline 130, moderate variability, +accelerations, -decelerations  General:  Alert, oriented and cooperative. Patient is in no acute distress.  Skin: Skin is warm  and dry. No rash noted.   Cardiovascular: Normal heart rate noted  Respiratory: Normal respiratory effort, no problems with respiration noted  Abdomen: Soft, gravid, appropriate for gestational age. Pain/Pressure: Absent     Pelvic:  Cervical exam deferred        Extremities: Normal range of motion.     Mental Status: Normal mood and affect. Normal behavior. Normal judgment and thought content.   Assessment   28 y.o. [id number] at [redacted]w[redacted]d by  10/15/2018, by Ultrasound presenting for routine prenatal visit  Plan   Pregnancy #5 Problems (from 04/07/18 to present)    No problems associated with this episode.      TDAP given today  Preterm labor symptoms and general obstetric precautions including but not limited to vaginal bleeding, contractions, leaking of fluid and fetal movement were reviewed in detail with the patient. Please refer to After Visit Summary for other counseling recommendations.   Return in about 3 days (around 09/05/2018) for AFI/NST/ROB in 3 days-needs 2x/wk NST and 1x/wk AFI.  [name], CNM 09/02/2018 2:14 PM

## 2018-09-04 ENCOUNTER — Other Ambulatory Visit: Payer: Self-pay

## 2018-09-04 ENCOUNTER — Ambulatory Visit (INDEPENDENT_AMBULATORY_CARE_PROVIDER_SITE_OTHER): Payer: Medicaid Other | Admitting: Obstetrics and Gynecology

## 2018-09-04 ENCOUNTER — Ambulatory Visit (INDEPENDENT_AMBULATORY_CARE_PROVIDER_SITE_OTHER): Payer: Medicaid Other

## 2018-09-04 ENCOUNTER — Encounter: Payer: Self-pay | Admitting: Obstetrics and Gynecology

## 2018-09-04 VITALS — BP 126/84 | Wt 312.0 lb

## 2018-09-04 DIAGNOSIS — Z3A34 34 weeks gestation of pregnancy: Secondary | ICD-10-CM

## 2018-09-04 DIAGNOSIS — Z6841 Body Mass Index (BMI) 40.0 and over, adult: Secondary | ICD-10-CM

## 2018-09-04 DIAGNOSIS — O0990 Supervision of high risk pregnancy, unspecified, unspecified trimester: Secondary | ICD-10-CM

## 2018-09-04 DIAGNOSIS — O99210 Obesity complicating pregnancy, unspecified trimester: Secondary | ICD-10-CM

## 2018-09-04 DIAGNOSIS — O10013 Pre-existing essential hypertension complicating pregnancy, third trimester: Secondary | ICD-10-CM

## 2018-09-04 DIAGNOSIS — O163 Unspecified maternal hypertension, third trimester: Secondary | ICD-10-CM | POA: Diagnosis not present

## 2018-09-04 DIAGNOSIS — O10019 Pre-existing essential hypertension complicating pregnancy, unspecified trimester: Secondary | ICD-10-CM

## 2018-09-04 DIAGNOSIS — Z8679 Personal history of other diseases of the circulatory system: Secondary | ICD-10-CM

## 2018-09-04 DIAGNOSIS — O34219 Maternal care for unspecified type scar from previous cesarean delivery: Secondary | ICD-10-CM

## 2018-09-04 DIAGNOSIS — O99213 Obesity complicating pregnancy, third trimester: Secondary | ICD-10-CM

## 2018-09-04 NOTE — Progress Notes (Signed)
Routine Prenatal Care Visit  Subjective  Ashley Howell is a 28 y.o. [phone] at [redacted]w[redacted]d being seen today for ongoing prenatal care.  She is currently monitored for the following issues for this high-risk pregnancy and has First trimester screening; History of cardiomegaly; Atrial septal defect; Previous cesarean delivery, antepartum condition or complication; Obesity in pregnancy, antepartum; S/P cesarean section; Benign essential hypertension, antepartum; Hypertension in pregnancy, antepartum; PFO (patent foramen ovale); Supervision of high risk pregnancy, antepartum; Pregnancy with abdominal pain of lower quadrant, antepartum; Elevated blood pressure affecting pregnancy in third trimester, antepartum; RUQ pain; and BMI 40.0-44.9, adult (HCC) on their problem list.  ----------------------------------------------------------------------------------- Patient reports no complaints.   Contractions: Not present. Vag. Bleeding: None.  Movement: Present. Denies leaking of fluid.  U/S today shows normal afi ----------------------------------------------------------------------------------- The following portions of the patient's history were reviewed and updated as appropriate: allergies, current medications, past family history, past medical history, past social history, past surgical history and problem list. Problem list updated.   Objective  Blood pressure 126/84, weight (!) 312 lb (141.5 kg), last menstrual period 01/08/2018. Pregravid weight 275 lb (124.7 kg) Total Weight Gain 37 lb (16.8 kg) Urinalysis: Urine Protein    Urine Glucose    Fetal Status: Fetal Heart Rate (bpm): 135   Movement: Present  Presentation: Vertex  General:  Alert, oriented and cooperative. Patient is in no acute distress.  Skin: Skin is warm and dry. No rash noted.   Cardiovascular: Normal heart rate noted  Respiratory: Normal respiratory effort, no problems with respiration noted  Abdomen: Soft, gravid,  appropriate for gestational age. Pain/Pressure: Absent     Pelvic:  Cervical exam deferred        Extremities: Normal range of motion.  Edema: None  Mental Status: Normal mood and affect. Normal behavior. Normal judgment and thought content.   NST: Baseline FHR: 135 beats/min Variability: moderate Accelerations: present Decelerations: absent Tocometry: not done  Interpretation:  INDICATIONS: chronic hypertension RESULTS:  A NST procedure was performed with FHR monitoring and a normal baseline established, appropriate time of 20-40 minutes of evaluation, and accels >2 seen w 15x15 characteristics.  Results show a REACTIVE NST.    Imaging Results US Ob Limited  Result Date: 09/04/2018 Patient Name: Ashley Howell DOB: 1990-09-01 MRN: [id number] ULTRASOUND REPORT Location: [facility] Date of Service: 09/04/2018 Indications:AFI Findings: Mason Jim intrauterine pregnancy is visualized with FHR at 141 BPM. Fetal presentation is Cephalic. Placenta: posterior. Grade: 2 AFI: 7.7 cm Impression: 1. [redacted]w[redacted]d Viable Singleton Intrauterine pregnancy dated by previously established criteria. 2. AFI is 7.7 cm. Darlina Guys, RT The ultrasound images and findings were reviewed by me and I agree with the above report. Thomasene Mohair, MD, [facility], [facility] 09/04/2018 10:45 AM       Assessment   27 y.o. [id number] at [redacted]w[redacted]d by  10/15/2018, by Ultrasound presenting for routine prenatal visit  Plan   Pregnancy #5 Problems (from 04/07/18 to present)    Problem Noted Resolved   BMI 40.0-44.9, adult (HCC) 09/04/2018 by Conard Novak, MD No   Supervision of high risk pregnancy, antepartum 05/26/2018 by [name], CNM No   Overview Addendum 09/02/2018  2:17 PM by [name], CNM    Clinic [facility] Prenatal Labs  Dating By 8 wk u/s c/w approx LMP Blood type:  O positive   Genetic Screen 1 Screen: negative   AFP: negative    Quad:     NIPS: Antibody: negative  [facility] normal 3/2 Rubella: Immune Varicella: Non-immune  GTT Early: 125 Third trimester:  RPR:  Non-reactive   Rhogam NA HBsAg:  negative   TDaP vaccine  09/02/18 Flu Shot: fall 2019 HIV:  negative   Baby Food                                GBS:   Contraception Desires tubal Pap: Negative January 2019  CBB     CS/VBAC 2010, 2012, 2016 Begin APT at 34 wks  Support Person Ashley Howell Maternal echo sched April         Obesity in pregnancy, antepartum 02/15/2015 by Farrel ConnersGutierrez, Colleen, CNM No   Previous cesarean delivery, antepartum condition or complication 02/08/2015 by Farrel ConnersGutierrez, Colleen, CNM No   Overview Signed 02/08/2015 12:15 AM by Farrel ConnersGutierrez, Colleen, CNM    Two prior Cesarean sections      History of cardiomegaly 10/28/2014 by Lady DeutscherJames, Andra, MD No      Preterm labor symptoms and general obstetric precautions including but not limited to vaginal bleeding, contractions, leaking of fluid and fetal movement were reviewed in detail with the patient. Please refer to After Visit Summary for other counseling recommendations.   Return in about 1 week (around 09/11/2018) for Keep previously scheduled appointments.  Thomasene MohairStephen Bernese Doffing, MD, [facility] FrederickFACOG [facility], [facility] 09/04/2018 10:53 AM

## 2018-09-08 ENCOUNTER — Other Ambulatory Visit: Payer: Self-pay

## 2018-09-08 ENCOUNTER — Ambulatory Visit (INDEPENDENT_AMBULATORY_CARE_PROVIDER_SITE_OTHER): Payer: Medicaid Other | Admitting: Obstetrics and Gynecology

## 2018-09-08 ENCOUNTER — Encounter: Payer: Self-pay | Admitting: Obstetrics and Gynecology

## 2018-09-08 VITALS — BP 138/80 | Wt 312.0 lb

## 2018-09-08 DIAGNOSIS — O99210 Obesity complicating pregnancy, unspecified trimester: Secondary | ICD-10-CM

## 2018-09-08 DIAGNOSIS — O99213 Obesity complicating pregnancy, third trimester: Secondary | ICD-10-CM

## 2018-09-08 DIAGNOSIS — Z3A34 34 weeks gestation of pregnancy: Secondary | ICD-10-CM | POA: Diagnosis not present

## 2018-09-08 DIAGNOSIS — O34219 Maternal care for unspecified type scar from previous cesarean delivery: Secondary | ICD-10-CM

## 2018-09-08 DIAGNOSIS — Z6841 Body Mass Index (BMI) 40.0 and over, adult: Secondary | ICD-10-CM

## 2018-09-08 DIAGNOSIS — O163 Unspecified maternal hypertension, third trimester: Secondary | ICD-10-CM

## 2018-09-08 DIAGNOSIS — O0990 Supervision of high risk pregnancy, unspecified, unspecified trimester: Secondary | ICD-10-CM

## 2018-09-08 LAB — FETAL NONSTRESS TEST

## 2018-09-08 NOTE — Progress Notes (Signed)
Routine Prenatal Care Visit  Subjective  Ashley Howell is a 28 y.o. [phone] at 4024w5d being seen today for ongoing prenatal care.  She is currently monitored for the following issues for this high-risk pregnancy and has First trimester screening; History of cardiomegaly; Atrial septal defect; Previous cesarean delivery, antepartum condition or complication; Obesity in pregnancy, antepartum; S/P cesarean section; Benign essential hypertension, antepartum; Hypertension in pregnancy, antepartum; PFO (patent foramen ovale); Supervision of high risk pregnancy, antepartum; Pregnancy with abdominal pain of lower quadrant, antepartum; Elevated blood pressure affecting pregnancy in third trimester, antepartum; RUQ pain; and BMI 40.0-44.9, adult (HCC) on their problem list.  ----------------------------------------------------------------------------------- Patient reports no complaints.   Contractions: Not present. Vag. Bleeding: None.  Movement: Present. Denies leaking of fluid.  ----------------------------------------------------------------------------------- The following portions of the patient's history were reviewed and updated as appropriate: allergies, current medications, past family history, past medical history, past social history, past surgical history and problem list. Problem list updated.   Objective  Blood pressure 138/80, weight (!) 312 lb (141.5 kg), last menstrual period 01/08/2018. Pregravid weight 275 lb (124.7 kg) Total Weight Gain 37 lb (16.8 kg) Urinalysis:      Fetal Status: Fetal Heart Rate (bpm): 135   Movement: Present     General:  Alert, oriented and cooperative. Patient is in no acute distress.  Skin: Skin is warm and dry. No rash noted.   Cardiovascular: Normal heart rate noted  Respiratory: Normal respiratory effort, no problems with respiration noted  Abdomen: Soft, gravid, appropriate for gestational age. Pain/Pressure: Absent     Pelvic:  Cervical exam  deferred        Extremities: Normal range of motion.  Edema: None  Mental Status: Normal mood and affect. Normal behavior. Normal judgment and thought content.   NST: 135 bpm baseline, moderate variability, more than 2 15x15  accelerations, no decelerations.  Assessment   28 y.o. [id number] at 124w5d by  10/15/2018, by Ultrasound presenting for routine prenatal visit  Plan   Pregnancy #5 Problems (from 04/07/18 to present)    Problem Noted Resolved   BMI 40.0-44.9, adult (HCC) 09/04/2018 by Conard NovakJackson, Stephen D, MD No   Supervision of high risk pregnancy, antepartum 05/26/2018 by Tresea MallGledhill, Jane, CNM No   Overview Addendum 09/02/2018  2:17 PM by Tresea MallGledhill, Jane, CNM    Clinic [facility] Prenatal Labs  Dating By 8 wk u/s c/w approx LMP Blood type:  O positive   Genetic Screen 1 Screen: negative   AFP: negative    Quad:     NIPS: Antibody: negative  [facility] normal 3/2 Rubella: Immune Varicella: Non-immune  GTT Early: 125 Third trimester:  RPR:  Non-reactive   Rhogam NA HBsAg:  negative   TDaP vaccine  09/02/18 Flu Shot: fall 2019 HIV:  negative   Baby Food                                GBS:   Contraception Desires tubal Pap: Negative January 2019  CBB     CS/VBAC 2010, 2012, 2016 Begin APT at 34 wks  Support Person Jamario Maternal echo sched April         Obesity in pregnancy, antepartum 02/15/2015 by Farrel ConnersGutierrez, Colleen, CNM No   Previous cesarean delivery, antepartum condition or complication 02/08/2015 by Farrel ConnersGutierrez, Colleen, CNM No   Overview Signed 02/08/2015 12:15 AM by Farrel ConnersGutierrez, Colleen, CNM  Two prior Cesarean sections      History of cardiomegaly 10/28/2014 by Lady Deutscher, MD No       Gestational age appropriate obstetric precautions including but not limited to vaginal bleeding, contractions, leaking of fluid and fetal movement were reviewed in detail with the patient.    Return in about 3 days (around 09/11/2018) for RObn in person as scheduled.   Natale Milch MD [facility], [facility] 09/08/2018, 12:04 PM

## 2018-09-08 NOTE — Progress Notes (Signed)
ROB/NST C/o fatigue, braxton hicks

## 2018-09-11 ENCOUNTER — Other Ambulatory Visit: Payer: Self-pay

## 2018-09-11 ENCOUNTER — Other Ambulatory Visit (INDEPENDENT_AMBULATORY_CARE_PROVIDER_SITE_OTHER): Payer: Medicaid Other

## 2018-09-11 ENCOUNTER — Ambulatory Visit (INDEPENDENT_AMBULATORY_CARE_PROVIDER_SITE_OTHER): Payer: Medicaid Other | Admitting: Certified Nurse Midwife

## 2018-09-11 VITALS — BP 136/90 | Wt 311.0 lb

## 2018-09-11 DIAGNOSIS — Z3A35 35 weeks gestation of pregnancy: Secondary | ICD-10-CM | POA: Diagnosis not present

## 2018-09-11 DIAGNOSIS — O99213 Obesity complicating pregnancy, third trimester: Secondary | ICD-10-CM

## 2018-09-11 DIAGNOSIS — O0993 Supervision of high risk pregnancy, unspecified, third trimester: Secondary | ICD-10-CM

## 2018-09-11 DIAGNOSIS — Z6841 Body Mass Index (BMI) 40.0 and over, adult: Secondary | ICD-10-CM

## 2018-09-11 DIAGNOSIS — O10013 Pre-existing essential hypertension complicating pregnancy, third trimester: Secondary | ICD-10-CM | POA: Diagnosis not present

## 2018-09-11 DIAGNOSIS — O0990 Supervision of high risk pregnancy, unspecified, unspecified trimester: Secondary | ICD-10-CM

## 2018-09-11 DIAGNOSIS — O99210 Obesity complicating pregnancy, unspecified trimester: Secondary | ICD-10-CM

## 2018-09-11 DIAGNOSIS — O10019 Pre-existing essential hypertension complicating pregnancy, unspecified trimester: Secondary | ICD-10-CM

## 2018-09-11 LAB — POCT URINALYSIS DIPSTICK OB
Glucose, UA: NEGATIVE
POC,PROTEIN,UA: NEGATIVE

## 2018-09-11 LAB — FETAL NONSTRESS TEST

## 2018-09-11 NOTE — Progress Notes (Signed)
No vb. No lof. Pt had some pressure the other day. NST/AFI today.

## 2018-09-11 NOTE — Progress Notes (Signed)
HROB/ NST/AFI at 35wk1day: CHTN on labetalol 100 mgm BID. Feels well. Baby moved all night. No LOF or bleeding. Has irregular BH contractions Last growth scan at [facility] 08/25/2018: 4#3oz (29%) at 32 wk 1 day. Has another growth scan scheduled June 15 at [facility] BP today 136/90 Negative proteinuria. NST today reactive after eating some crackers and VAS: baseline 140 with accelerations to 160s, moderate variability, no decelerations AFI: 7.21 Good FM noted on ultrasound On 07/31/18 was seen by Cardiologist Dr Mariah Milling. Her 2016 echo was reread. There is a small PFO with minimal to no  right to left shunting. Dr Mariah Milling did not recommend further testing. Anticoagulation not indicated ROB 1 week for NST/AFI/GBS Anesthesia consult-for BMI 48.71  Repeat CS and BTL 7/2 .

## 2018-09-17 ENCOUNTER — Telehealth: Payer: Self-pay | Admitting: Certified Nurse Midwife

## 2018-09-17 NOTE — Telephone Encounter (Signed)
Attempted to reach the patient w/ Anesthesia Consult appointment scheduled for 09/22/18 @ 9:30am at [facility], Pre-admit Testing. No answer, v/m is full.

## 2018-09-18 ENCOUNTER — Other Ambulatory Visit: Payer: Self-pay

## 2018-09-18 ENCOUNTER — Other Ambulatory Visit: Payer: Self-pay | Admitting: Certified Nurse Midwife

## 2018-09-18 DIAGNOSIS — O0990 Supervision of high risk pregnancy, unspecified, unspecified trimester: Secondary | ICD-10-CM

## 2018-09-18 DIAGNOSIS — O34219 Maternal care for unspecified type scar from previous cesarean delivery: Secondary | ICD-10-CM

## 2018-09-18 DIAGNOSIS — Z6841 Body Mass Index (BMI) 40.0 and over, adult: Secondary | ICD-10-CM

## 2018-09-18 DIAGNOSIS — O10019 Pre-existing essential hypertension complicating pregnancy, unspecified trimester: Secondary | ICD-10-CM

## 2018-09-18 DIAGNOSIS — Q2112 Patent foramen ovale: Secondary | ICD-10-CM

## 2018-09-18 DIAGNOSIS — O99210 Obesity complicating pregnancy, unspecified trimester: Secondary | ICD-10-CM

## 2018-09-18 DIAGNOSIS — Q211 Atrial septal defect: Secondary | ICD-10-CM

## 2018-09-19 ENCOUNTER — Ambulatory Visit (INDEPENDENT_AMBULATORY_CARE_PROVIDER_SITE_OTHER): Payer: Medicaid Other

## 2018-09-19 ENCOUNTER — Other Ambulatory Visit: Payer: Self-pay

## 2018-09-19 ENCOUNTER — Ambulatory Visit (INDEPENDENT_AMBULATORY_CARE_PROVIDER_SITE_OTHER): Payer: Medicaid Other | Admitting: Obstetrics and Gynecology

## 2018-09-19 VITALS — BP 128/80 | Wt 311.0 lb

## 2018-09-19 DIAGNOSIS — O10013 Pre-existing essential hypertension complicating pregnancy, third trimester: Secondary | ICD-10-CM

## 2018-09-19 DIAGNOSIS — Z3A36 36 weeks gestation of pregnancy: Secondary | ICD-10-CM

## 2018-09-19 DIAGNOSIS — O99213 Obesity complicating pregnancy, third trimester: Secondary | ICD-10-CM

## 2018-09-19 DIAGNOSIS — O99210 Obesity complicating pregnancy, unspecified trimester: Secondary | ICD-10-CM

## 2018-09-19 DIAGNOSIS — O0990 Supervision of high risk pregnancy, unspecified, unspecified trimester: Secondary | ICD-10-CM

## 2018-09-19 DIAGNOSIS — Z8679 Personal history of other diseases of the circulatory system: Secondary | ICD-10-CM

## 2018-09-19 DIAGNOSIS — Z3685 Encounter for antenatal screening for Streptococcus B: Secondary | ICD-10-CM

## 2018-09-19 DIAGNOSIS — O10019 Pre-existing essential hypertension complicating pregnancy, unspecified trimester: Secondary | ICD-10-CM

## 2018-09-19 DIAGNOSIS — O0993 Supervision of high risk pregnancy, unspecified, third trimester: Secondary | ICD-10-CM

## 2018-09-19 DIAGNOSIS — O163 Unspecified maternal hypertension, third trimester: Secondary | ICD-10-CM

## 2018-09-19 DIAGNOSIS — O34219 Maternal care for unspecified type scar from previous cesarean delivery: Secondary | ICD-10-CM

## 2018-09-19 NOTE — Progress Notes (Signed)
ROB NST/AFI 

## 2018-09-19 NOTE — Progress Notes (Signed)
Routine Prenatal Care Visit  Subjective  Ashley Howell is a 28 y.o. [phone] at [redacted]w[redacted]d being seen today for ongoing prenatal care.  She is currently monitored for the following issues for this high-risk pregnancy and has First trimester screening; History of cardiomegaly; Previous cesarean delivery, antepartum condition or complication; Obesity in pregnancy, antepartum; Benign essential hypertension, antepartum; PFO (patent foramen ovale); Supervision of high risk pregnancy, antepartum; Pregnancy with abdominal pain of lower quadrant, antepartum; Elevated blood pressure affecting pregnancy in third trimester, antepartum; and BMI 40.0-44.9, adult ([place]) on their problem list.  ----------------------------------------------------------------------------------- Patient reports no complaints.   Contractions: Not present. Vag. Bleeding: None.  Movement: Present. Denies leaking of fluid.  ----------------------------------------------------------------------------------- The following portions of the patient's history were reviewed and updated as appropriate: allergies, current medications, past family history, past medical history, past social history, past surgical history and problem list. Problem list updated.   Objective  Last menstrual period Blood pressure 128/80, weight (!) 311 lb (141.1 kg), last menstrual period 01/08/2018. Pregravid weight 275 lb (124.7 kg) Total Weight Gain 36 lb (16.3 kg) Urinalysis:      Fetal Status: Fetal Heart Rate (bpm): 145   Movement: Present  Presentation: Vertex  General:  Alert, oriented and cooperative. Patient is in no acute distress.  Skin: Skin is warm and dry. No rash noted.   Cardiovascular: Normal heart rate noted  Respiratory: Normal respiratory effort, no problems with respiration noted  Abdomen: Soft, gravid, appropriate for gestational age. Pain/Pressure: Absent     Pelvic:  Cervical exam deferred Dilation: Closed Effacement (%): 50  Station: -3  Extremities: Normal range of motion.     ental Status: Normal mood and affect. Normal behavior. Normal judgment and thought content.   US Ob Limited  Result Date: 09/19/2018 Patient Name: Ashley Howell DOB: 16-Jun-1990 MRN: [id number] ULTRASOUND REPORT Location: [facility] Date of Service: 09/19/2018 Indications:AFI Findings: Ashley Howell intrauterine pregnancy is visualized with FHR at 132 BPM. Fetal presentation is Cephalic. Placenta: posterior. Grade: 2 AFI: 8.2 cm Impression: 1. [redacted]w[redacted]d Viable Singleton Intrauterine pregnancy dated by previously established criteria. 2. AFI is 8.2 cm. Recommendations:l correlation with the patient's History and Physical Exam. Vita Barley, RT There is a singleton gestation with normal amniotic fluid volume. The visualized fetal anatomical survey appears within normal limits within the resolution of ultrasound as described above.  It must be noted that a normal ultrasound is unable to rule out fetal aneuploidy.  Malachy Mood, MD, [facility], [facility] 09/19/2018, 2:17 PM  US Ob Limited  Result Date: 09/16/2018 Patient Name: Ashley Howell DOB: [birth year] MRN: [id number] ULTRASOUND REPORT Location: [facility] Date of Service: 09/11/2018 Indications:AFI Findings: Ashley Howell intrauterine pregnancy is visualized with FHR at 135 BPM. Fetal presentation is Cephalic. Placenta: posterior. Grade: 2 AFI: 7.2 cm Impression: 1. [redacted]w[redacted]d Viable Singleton Intrauterine pregnancy dated by previously established criteria. 2. AFI is 7.2 cm. Recommendations: 1.Clinical correlation with the patient's History and Physical Exam. Vita Barley, RT I have reviewed this ultrasound and the report. I agree with the above assessment and plan. [facility] 09/16/18 9:56 PM    US Ob Limited  Result Date: 09/04/2018 Patient Name: Ashley Howell DOB: Sep 05, 1990 MRN: [id number] ULTRASOUND REPORT  Location: [facility] Date of Service: 09/04/2018 Indications:AFI Findings: Ashley Howell intrauterine pregnancy is visualized with FHR at 141 BPM. Fetal presentation is Cephalic. Placenta: posterior. Grade: 2 AFI: 7.7 cm Impression: 1. [redacted]w[redacted]d Viable Ashley Howell  Intrauterine pregnancy dated by previously established criteria. 2. AFI is 7.7 cm. Darlina GuysAbby M Clarke, RT The ultrasound images and findings were reviewed by me and I agree with the above report. Thomasene MohairStephen Jackson, MD, Merlinda FrederickFACOG [facility], [facility] 09/04/2018 10:45 AM     [place] Mfm Ob Follow Up  Result Date: 08/25/2018 ----------------------------------------------------------------------  OBSTETRICS REPORT                       (Signed Final 08/25/2018 12:00 pm) ---------------------------------------------------------------------- PATIENT INFO:  ID #:       [id number]                          D.O.B.:  03/29/1991 (27 yrs)  Name:       Ashley Howell             Visit Date: 08/25/2018 10:59 am ---------------------------------------------------------------------- PERFORMED BY:  Performed By:     Verne Grainonna Moody            Referred By:      Paula ComptonKARLA [facility]                    Sonographer                              LEATH ---------------------------------------------------------------------- SERVICE(S) PROVIDED:   US MFM OB FOLLOW UP                                  [id number]  ---------------------------------------------------------------------- INDICATIONS:   [redacted] weeks gestation of pregnancy                Z3A.32  ---------------------------------------------------------------------- FETAL EVALUATION:  Num Of Fetuses:         1  Preg. Location:         Mid  Fetal Heart Rate(bpm):  143  Cardiac Activity:       Present  Presentation:           Vertex  Placenta:               Posterior Grade 1, No previa  Amniotic Fluid  AFI FV:      Within normal limits  AFI Sum(cm)     %Tile       Largest Pocket(cm)  7.4             < 3         3.3  ---------------------------------------------------------------------- BIOMETRY:  BPD:        78  mm     G. Age:  31w 2d          9  %    CI:        72.36   %    70 - 86                                                          FL/HC:      22.1   %    19.9 - 21.5  HC:      291.7  mm     G. Age:  32w 1d  8  %    HC/AC:      1.06        0.96 - 1.11  AC:      275.7  mm     G. Age:  31w 5d         21  %    FL/BPD:     82.6   %    71 - 87  FL:       64.4  mm     G. Age:  33w 2d         53  %    FL/AC:      23.4   %    20 - 24  HUM:      57.4  mm     G. Age:  33w 2d         69  %  Est. FW:    1913  gm      4 lb 3 oz     29  % ---------------------------------------------------------------------- GESTATIONAL AGE:  LMP:           32w 5d        Date:  01/08/18                 EDD:   10/15/18  U/S Today:     32w 1d                                        EDD:   10/19/18  Best:          32w 5d     Det. By:  Marcella Dubs         EDD:   10/15/18                                      (03/07/18) ---------------------------------------------------------------------- ANATOMY:  Cranium:               Normal appearance      Ductal Arch:            Normal appearance  Cavum:                 Normal appearance      Diaphragm:              Within Normal Limits  Ventricles:            Normal appearance      Stomach:                Seen  Thoracic:              Normal appearance      Abdomen:                Normal appearance  Heart:                 Not well seen but      Kidneys:                Normal appearance                         grossly normal  RVOT:                  Normal appearance  Bladder:                Seen  LVOT:                  Normal appearance      Spine:                  Normal appearance  Aortic Arch:           Normal appearance ---------------------------------------------------------------------- IMPRESSION:  Thank you for referring your patient for a fetal growth  evaluation.  Her pregnancy is complicated  by HTN(on  labetalol 100 BID), BMI 48, history of prior cesarean delivery  x 3 and possible maternal PFO. She reports normal fetal  echocardiogram during this pregnancy.  There is a singleton gestation with normal amniotic fluid  volume.  The fetal biometry correlates with established dating.  Adequate interval growth noted.   Due to advanced gestation  and fetal position, images of the fetal heart (cardiac outflow  tracts) were suboptimally imaged. Active fetal movements  were seen.  Incidental BPP 8/8 today.  Recommend follow-up scan for fetal growth in 4 weeks. We  have scheduled here, but if more convenient to perform in  your offiec, please cancel appointment here.  Recommend weekly antenatal testing at 34 weeks.  Thank you for allowing us to participate in your patient's care.  Please do not hesitate to contact us if we can be of further  assistance. ----------------------------------------------------------------------                   Consuelo PandyMaria Small, MD Electronically Signed Final Report   08/25/2018 12:00 pm ----------------------------------------------------------------------  [place] Abdomen Limited Ruq  Result Date: 08/22/2018 CLINICAL DATA:  Right upper quadrant pain since yesterday. Pregnant patient in third trimester pregnancy. EXAM: ULTRASOUND ABDOMEN LIMITED RIGHT UPPER QUADRANT COMPARISON:  None. FINDINGS: Gallbladder: Only partially distended. No gallstones or wall thickening visualized. No sonographic Murphy sign noted by sonographer. Common bile duct: Diameter: 3 mm, normal. Liver: No focal lesion identified. Within normal limits in parenchymal echogenicity. Portal vein is patent on color Doppler imaging with normal direction of blood flow towards the liver. No ascites. IMPRESSION: Unremarkable right upper quadrant ultrasound. Electronically Signed   By: Narda RutherfordMelanie  Sanford M.D.   On: 08/22/2018 03:23   Baseline: 145 Variability: moderate Accelerations: present Decelerations: absent Tocometry:  none The patient was monitored for 30 minutes, fetal heart rate tracing was deemed reactive, category I tracing.   Assessment   28 y.o. [id number] at 401w2d by  10/15/2018, by Ultrasound presenting for routine prenatal visit  Plan   Pregnancy #5 Problems (from 04/07/18 to present)    Problem Noted Resolved   BMI 40.0-44.9, adult ([facility]) 09/04/2018 by Conard NovakJackson, Stephen D, MD No   Supervision of high risk pregnancy, antepartum 05/26/2018 by Tresea MallGledhill, Jane, CNM No   Overview Addendum 09/08/2018 12:05 PM by Natale MilchSchuman, Christanna R, MD    [facility] Prenatal Labs  Dating By 8 wk u/s c/w approx LMP Blood type:  O positive   Genetic Screen 1 Screen: negative   AFP: negative   Antibody: negative  [facility] Fetal echo normal 3/2 Rubella: Immune Varicella: Non-immune  GTT Early: 125  Third trimester: 126 RPR:  Non-reactive   Rhogam NA HBsAg:  negative   TDaP vaccine  09/02/18 Flu Shot: fall 2019 HIV:  negative   Baby Food  GBS:   Contraception Desires tubal Pap: Negative January 2019  CBB     CS/VBAC 2010, 2012, 2016 Begin APT at 34 wks  Support Person Lattie HawJamario Maternal echo sched April         Obesity in pregnancy, antepartum 02/15/2015 by Farrel ConnersGutierrez, Colleen, CNM No   Previous cesarean delivery, antepartum condition or complication 02/08/2015 by Farrel ConnersGutierrez, Colleen, CNM No   Overview Addendum 09/08/2018 12:04 PM by Natale MilchSchuman, Christanna R, MD    Three prior Cesarean sections      History of cardiomegaly 10/28/2014 by Lady DeutscherJames, Andra, MD No       Gestational age appropriate obstetric precautions including but not limited to vaginal bleeding, contractions, leaking of fluid and fetal movement were reviewed in detail with the patient.    - good BP control normotensive today - reactive NST - growth scan next visit - GBS today  Return in about 1 week (around 09/26/2018) for ROB and growth scan & NST.  Vena AustriaAndreas Jakyle Petrucelli, MD, Merlinda FrederickFACOG [facility], [facility] 09/19/2018, 2:38 PM

## 2018-09-21 ENCOUNTER — Encounter: Payer: Self-pay | Admitting: Obstetrics and Gynecology

## 2018-09-21 DIAGNOSIS — B951 Streptococcus, group B, as the cause of diseases classified elsewhere: Secondary | ICD-10-CM | POA: Insufficient documentation

## 2018-09-21 LAB — STREP GP B NAA: Strep Gp B NAA: POSITIVE — AB

## 2018-09-22 ENCOUNTER — Ambulatory Visit
Admission: RE | Admit: 2018-09-22 | Discharge: 2018-09-22 | Disposition: A | Discharge status: Home / Self Care | Payer: Medicaid Other | Source: Ambulatory Visit | Attending: Maternal & Fetal Medicine | Admitting: Maternal & Fetal Medicine

## 2018-09-22 ENCOUNTER — Other Ambulatory Visit: Payer: Self-pay

## 2018-09-22 ENCOUNTER — Inpatient Hospital Stay
Admission: RE | Admit: 2018-09-22 | Discharge: 2018-09-22 | Disposition: A | Discharge status: Home / Self Care | Payer: Medicaid Other | Source: Ambulatory Visit

## 2018-09-22 ENCOUNTER — Other Ambulatory Visit: Payer: Self-pay | Admitting: Maternal & Fetal Medicine

## 2018-09-22 ENCOUNTER — Encounter
Admission: RE | Admit: 2018-09-22 | Discharge: 2018-09-22 | Disposition: A | Discharge status: Home / Self Care | Payer: Medicaid Other | Source: Ambulatory Visit | Attending: Anesthesiology | Admitting: Anesthesiology

## 2018-09-22 DIAGNOSIS — Z3A36 36 weeks gestation of pregnancy: Secondary | ICD-10-CM | POA: Insufficient documentation

## 2018-09-22 DIAGNOSIS — O34219 Maternal care for unspecified type scar from previous cesarean delivery: Secondary | ICD-10-CM | POA: Diagnosis not present

## 2018-09-22 DIAGNOSIS — O0993 Supervision of high risk pregnancy, unspecified, third trimester: Secondary | ICD-10-CM | POA: Diagnosis not present

## 2018-09-22 DIAGNOSIS — O0990 Supervision of high risk pregnancy, unspecified, unspecified trimester: Secondary | ICD-10-CM

## 2018-09-22 DIAGNOSIS — O365990 Maternal care for other known or suspected poor fetal growth, unspecified trimester, not applicable or unspecified: Secondary | ICD-10-CM

## 2018-09-22 DIAGNOSIS — O163 Unspecified maternal hypertension, third trimester: Secondary | ICD-10-CM | POA: Insufficient documentation

## 2018-09-22 NOTE — Consult Note (Signed)
[facility] Anesthesia Consultation  Ashley Howell OVF:[id number] DOB: 12/20/1990 DOA: 09/22/2018 [facility]   Requesting physician: Dr. Bonney AidStaebler Date of consultation: 09/22/18 Reason for consultation: Obesity during pregnancy  CHIEF COMPLAINT:  Obesity during pregnancy  HISTORY OF PRESENT ILLNESS: Ashley Howell  is a 28 y.o. female with a known history of obesity during pregnancy. This is her 4th pregnancy, she has had 3 prior cesarean deliveries and planning for repeat cesarean for this delivery. Has been diagnosed with cHTN and is currently taking labetalol. Denies any hx of asthma. Denies personal or family hx of bleeding disorders. First cesarean was emergent but all since have been repeat planned cesarean deliveries done with neuraxial anesthesia.   PAST MEDICAL HISTORY:   Past Medical History:  Diagnosis Date  . Anxiety   . GERD (gastroesophageal reflux disease)   . Heart defect    ASD  . Shortness of breath dyspnea     PAST SURGICAL HISTORY:  Past Surgical History:  Procedure Laterality Date  . CESAREAN SECTION  10/28/2008  . CESAREAN SECTION N/A 02/22/2015   Procedure: CESAREAN SECTION;  Surgeon: Nadara Mustardobert P Harris, MD;  Location: [facility] ORS;  Service: Obstetrics;  Laterality: N/A;  . CESAREAN SECTION  04/13/2010    SOCIAL HISTORY:  Social History   Tobacco Use  . Smoking status: Current Every Day Smoker    Packs/day: 0.25    Types: Cigarettes    Last attempt to quit: 05/23/2014    Years since quitting: 4.3  . Smokeless tobacco: Never Used  Substance Use Topics  . Alcohol use: No    FAMILY HISTORY:  Family History  Problem Relation Age of Onset  . Hypertension Mother   . Stroke Paternal Aunt   . Cancer Maternal Grandfather        Colon    DRUG ALLERGIES: No Known Allergies  REVIEW OF SYSTEMS:   RESPIRATORY: No cough, shortness of breath, wheezing.  CARDIOVASCULAR: No chest pain,  orthopnea, edema.  HEMATOLOGY: No anemia, easy bruising or bleeding SKIN: No rash or lesion. NEUROLOGIC: No tingling, numbness, weakness.  PSYCHIATRY: No anxiety or depression.   MEDICATIONS AT HOME:  Prior to Admission medications   Medication Sig Start Date End Date Taking? Authorizing Provider  aspirin 81 MG chewable tablet Chew 81 mg by mouth daily.    [provider]  labetalol (NORMODYNE) 100 MG tablet Take 1 tablet (100 mg total) by mouth 2 (two) times daily. 08/12/18   Tresea MallGledhill, Jane, CNM  Prenatal Vit-Fe Fumarate-FA (PRENATAL MULTIVITAMIN) TABS tablet Take 1 tablet by mouth daily at 12 noon.    [provider]      PHYSICAL EXAMINATION:   VITAL SIGNS: Last menstrual period 01/08/2018.  GENERAL:  28 y.o.-year-old patient no acute distress.  HEENT: Head atraumatic, normocephalic. Oropharynx and nasopharynx clear. MP 1, TM distance >3 cm, normal mouth opening. LUNGS: Normal breath sounds bilaterally, no wheezing, rales,rhonchi. No use of accessory muscles of respiration.  CARDIOVASCULAR: S1, S2 normal. No murmurs, rubs, or gallops.  EXTREMITIES: No pedal edema, cyanosis, or clubbing.  NEUROLOGIC: normal gait PSYCHIATRIC: The patient is alert and oriented x 3.  SKIN: No obvious rash, lesion, or ulcer.    IMPRESSION AND PLAN:   Ashley Howell  is a 28 y.o. female presenting with obesity during pregnancy. BMI is currently 48 at [redacted] weeks gestation.   Airway exam reassuring. Midline easily identified and interspaces minimally palpable.   Planning for repeat cesarean delivery, currently scheduled for  10/09/18. Discussed plan for spinal injection for anesthesia. Discussed that even if she ended up coming in prior to her currently scheduled date we would plan for neuraxial anesthesia unless there was an emergent need for rapid delivery that would necessitate GA.   Plan [facility] at [facility].

## 2018-09-25 ENCOUNTER — Other Ambulatory Visit: Payer: Self-pay | Admitting: Obstetrics and Gynecology

## 2018-09-25 ENCOUNTER — Encounter: Payer: Self-pay | Admitting: Obstetrics & Gynecology

## 2018-09-25 ENCOUNTER — Ambulatory Visit (INDEPENDENT_AMBULATORY_CARE_PROVIDER_SITE_OTHER): Payer: Medicaid Other | Admitting: Obstetrics & Gynecology

## 2018-09-25 ENCOUNTER — Encounter: Payer: Self-pay | Admitting: *Deleted

## 2018-09-25 ENCOUNTER — Observation Stay
Admission: EM | Admit: 2018-09-25 | Discharge: 2018-09-25 | Disposition: A | Discharge status: Home / Self Care | Payer: Medicaid Other | Attending: Obstetrics and Gynecology | Admitting: Obstetrics and Gynecology

## 2018-09-25 ENCOUNTER — Other Ambulatory Visit: Payer: Self-pay

## 2018-09-25 ENCOUNTER — Ambulatory Visit: Payer: Medicaid Other

## 2018-09-25 VITALS — BP 130/80 | Wt 311.0 lb

## 2018-09-25 DIAGNOSIS — B951 Streptococcus, group B, as the cause of diseases classified elsewhere: Secondary | ICD-10-CM

## 2018-09-25 DIAGNOSIS — O10013 Pre-existing essential hypertension complicating pregnancy, third trimester: Secondary | ICD-10-CM

## 2018-09-25 DIAGNOSIS — Z6841 Body Mass Index (BMI) 40.0 and over, adult: Secondary | ICD-10-CM

## 2018-09-25 DIAGNOSIS — O99333 Smoking (tobacco) complicating pregnancy, third trimester: Secondary | ICD-10-CM | POA: Diagnosis not present

## 2018-09-25 DIAGNOSIS — O99613 Diseases of the digestive system complicating pregnancy, third trimester: Secondary | ICD-10-CM | POA: Diagnosis not present

## 2018-09-25 DIAGNOSIS — O26893 Other specified pregnancy related conditions, third trimester: Secondary | ICD-10-CM | POA: Insufficient documentation

## 2018-09-25 DIAGNOSIS — Z3A37 37 weeks gestation of pregnancy: Secondary | ICD-10-CM

## 2018-09-25 DIAGNOSIS — O10913 Unspecified pre-existing hypertension complicating pregnancy, third trimester: Secondary | ICD-10-CM

## 2018-09-25 DIAGNOSIS — O99213 Obesity complicating pregnancy, third trimester: Secondary | ICD-10-CM

## 2018-09-25 DIAGNOSIS — Z79899 Other long term (current) drug therapy: Secondary | ICD-10-CM | POA: Insufficient documentation

## 2018-09-25 DIAGNOSIS — O163 Unspecified maternal hypertension, third trimester: Secondary | ICD-10-CM | POA: Diagnosis present

## 2018-09-25 DIAGNOSIS — K219 Gastro-esophageal reflux disease without esophagitis: Secondary | ICD-10-CM | POA: Diagnosis not present

## 2018-09-25 DIAGNOSIS — O99343 Other mental disorders complicating pregnancy, third trimester: Secondary | ICD-10-CM | POA: Diagnosis not present

## 2018-09-25 DIAGNOSIS — O99210 Obesity complicating pregnancy, unspecified trimester: Secondary | ICD-10-CM

## 2018-09-25 DIAGNOSIS — O34219 Maternal care for unspecified type scar from previous cesarean delivery: Secondary | ICD-10-CM | POA: Diagnosis present

## 2018-09-25 DIAGNOSIS — R42 Dizziness and giddiness: Secondary | ICD-10-CM | POA: Diagnosis not present

## 2018-09-25 DIAGNOSIS — Z8679 Personal history of other diseases of the circulatory system: Secondary | ICD-10-CM

## 2018-09-25 DIAGNOSIS — R51 Headache: Secondary | ICD-10-CM | POA: Insufficient documentation

## 2018-09-25 DIAGNOSIS — F419 Anxiety disorder, unspecified: Secondary | ICD-10-CM | POA: Insufficient documentation

## 2018-09-25 DIAGNOSIS — I10 Essential (primary) hypertension: Secondary | ICD-10-CM | POA: Diagnosis present

## 2018-09-25 DIAGNOSIS — O365930 Maternal care for other known or suspected poor fetal growth, third trimester, not applicable or unspecified: Secondary | ICD-10-CM

## 2018-09-25 DIAGNOSIS — O10019 Pre-existing essential hypertension complicating pregnancy, unspecified trimester: Secondary | ICD-10-CM

## 2018-09-25 DIAGNOSIS — O0990 Supervision of high risk pregnancy, unspecified, unspecified trimester: Secondary | ICD-10-CM

## 2018-09-25 LAB — POCT URINALYSIS DIPSTICK OB: Glucose, UA: NEGATIVE

## 2018-09-25 NOTE — Progress Notes (Signed)
  Subjective  Fetal Movement? yes Contractions? no Leaking Fluid? no Vaginal Bleeding? no  Objective  BP 130/80   Wt (!) 311 lb (141.1 kg)   LMP 01/08/2018 (Approximate)   BMI 48.71 kg/m  General: NAD Pumonary: no increased work of breathing Abdomen: gravid, non-tender Extremities: no edema Psychiatric: mood appropriate, affect full  A NST procedure was performed with FHR monitoring and a normal baseline established, appropriate time of 20-40 minutes of evaluation, and accels >2 seen w 15x15 characteristics.  Results show a REACTIVE NST.   Assessment  28 y.o. [id number] at [redacted]w[redacted]d by  10/15/2018, by Ultrasound presenting for routine prenatal visit  Plan   Problem List Items Addressed This Visit      Cardiovascular and Mediastinum   Benign essential hypertension, antepartum     Other   Obesity in pregnancy, antepartum   Elevated blood pressure affecting pregnancy in third trimester, antepartum   BMI 40.0-44.9, adult ([place])    Other Visit Diagnoses    [redacted] weeks gestation of pregnancy    -  Primary      Pregnancy #5 Problems (from 04/07/18 to present)    Problem Noted Resolved   Positive GBS test 09/21/2018 by Malachy Mood, MD No   BMI 40.0-44.9, adult ([place]) 09/04/2018 by Will Bonnet, MD No   Supervision of high risk pregnancy, antepartum 05/26/2018 by Rod Can, CNM No   Overview Addendum 09/08/2018 12:05 PM by Homero Fellers, [place] Prenatal Labs  Dating By 8 wk u/s c/w approx LMP Blood type:  O positive   Genetic Screen 1 Screen: negative   AFP: negative   Antibody: negative  Anatomic [place] Complete at [facility] 3/9 Fetal echo normal 3/2 Rubella: Immune Varicella: Non-immune  GTT Early: 125  Third trimester: 126 RPR:  Non-reactive   Rhogam NA HBsAg:  negative   TDaP vaccine  09/02/18 Flu Shot: fall 2019 HIV:  negative   Baby Food                                GBS:   Contraception Desires tubal Pap: Negative January 2019  CBB     CS/VBAC 2010,  2012, 2016 Begin APT at 20 wks  Support Person [place] Maternal echo sched April         Obesity in pregnancy, antepartum 02/15/2015 by Dalia Heading, CNM No   Previous cesarean delivery, antepartum condition or complication 81/11/2991 by Dalia Heading, CNM No   Overview Addendum 09/08/2018 12:04 PM by Homero Fellers, MD    Three prior Cesarean sections      History of cardiomegaly 10/28/2014 by Dellia Nims, MD No      NST R today, cont twice weekly APT (Monday at [facility]) PNV< [facility], Labor precautions, plans for CS w BTL 7/2 No s/sxc preeclampsia, cont to monitor Cleared by Anesthesia for [facility]  Barnett Applebaum, MD, [facility], [facility] 09/25/2018  10:15 AM

## 2018-09-25 NOTE — OB Triage Note (Addendum)
Recvd pt from ED. Pt states she started feeling dizzy about an hour ago after eating dinner, then throwing up. Pt also c/o a headache and some abdominal cramping that started around the same time. Pt states she has high blood pressure and takes labetalol 100mg  twice a day. She took both doses today. Pt took tylenol when this started and states that he feels like it is helping. She also says that the headache and dizziness is not as bad when she lays down.

## 2018-09-25 NOTE — Final Progress Note (Signed)
Physician Final Progress Note  Patient ID: Ashley Howell MRN: [id number] DOB/AGE: [birth year] 28 y.o.  Admit date: 09/25/2018 Admitting provider: Will Bonnet, MD Discharge date: 09/25/2018   Admission Diagnoses:  1) intrauterine pregnancy at [redacted]w[redacted]d  2) headache, feeling dizzy 3) chronic hypertension affecting pregnancy, third trimester  Discharge Diagnoses:  1) intrauterine pregnancy at [redacted]w[redacted]d  2) headache, feeling dizzy 3) chronic hypertension affecting pregnancy, third trimester  History of Present Illness: The patient is a 28 y.o. female [phone] at [redacted]w[redacted]d who presents for vague complaints.  She presents with lower back and abdominal cramping. Associated with these symptoms was a headache and feeling lightheaded.  She took some Tylenol prior to coming to the hospital. She states that her headache has resolved at this point.  She denies any visual changes and RUQ pain.  She notes +FM, no LOF, and no vaginal bleeding.    Past Medical History:  Diagnosis Date  . Anxiety   . GERD (gastroesophageal reflux disease)   . Heart defect    ASD  . Shortness of breath dyspnea     Past Surgical History:  Procedure Laterality Date  . CESAREAN SECTION  10/28/2008  . CESAREAN SECTION N/A 02/22/2015   Procedure: CESAREAN SECTION;  Surgeon: Gae Dry, MD;  Location: [facility] ORS;  Service: Obstetrics;  Laterality: N/A;  . CESAREAN SECTION  04/13/2010    No current facility-administered medications on file prior to encounter.    Current Outpatient Medications on File Prior to Encounter  Medication Sig Dispense Refill  . aspirin 81 MG chewable tablet Chew 81 mg by mouth daily.    Marland Kitchen labetalol (NORMODYNE) 100 MG tablet Take 1 tablet (100 mg total) by mouth 2 (two) times daily. 60 tablet 4  . Prenatal Vit-Fe Fumarate-FA (PRENATAL MULTIVITAMIN) TABS tablet Take 1 tablet by mouth daily at 12 noon.     Allergies: No Known Allergies  Social History   Socioeconomic History  . Marital  status: Single    Spouse name: Not on file  . Number of children: 3  . Years of education: Not on file  . Highest education level: Not on file  Occupational History  . Occupation: CNA  Social Needs  . Financial resource strain: Not hard at all  . Food insecurity    Worry: Never true    Inability: Never true  . Transportation needs    Medical: No    Non-medical: No  Tobacco Use  . Smoking status: Current Every Day Smoker    Packs/day: 0.25    Types: Cigarettes    Last attempt to quit: 05/23/2014    Years since quitting: 4.3  . Smokeless tobacco: Never Used  Substance and Sexual Activity  . Alcohol use: No  . Drug use: No  . Sexual activity: Yes    Birth control/protection: Surgical    Comment: BTL  Lifestyle  . Physical activity    Days per week: 4 days    Minutes per session: 30 min  . Stress: Not at all  Relationships  . Social Herbalist on phone: Three times a week    Gets together: Twice a week    Attends religious service: 1 to 4 times per year    Active member of club or organization: No    Attends meetings of clubs or organizations: Never    Relationship status: Living with partner  . Intimate partner violence    Fear of current or ex partner: No  Emotionally abused: No    Physically abused: No    Forced sexual activity: No  Other Topics Concern  . Not on file  Social History Narrative  . Not on file    Family History  Problem Relation Age of Onset  . Hypertension Mother   . Stroke Paternal Aunt   . Cancer Maternal Grandfather        Colon     Review of Systems  Constitutional: Negative.   HENT: Negative.   Eyes: Negative.   Respiratory: Negative.   Cardiovascular: Negative.   Gastrointestinal: Negative.   Genitourinary: Negative.   Musculoskeletal: Negative.   Skin: Negative.   Neurological: Negative.   Psychiatric/Behavioral: Negative.      Physical Exam: BP (!) 116/56   Pulse 98   Temp 98.3 F (36.8 C) (Oral)   Resp  18   LMP 01/08/2018 (Approximate)   Physical Exam Constitutional:      General: She is not in acute distress.    Appearance: Normal appearance. She is well-developed.  HENT:     Head: Normocephalic and atraumatic.  Eyes:     General: No scleral icterus.    Conjunctiva/sclera: Conjunctivae normal.  Neck:     Musculoskeletal: Normal range of motion and neck supple.  Cardiovascular:     Rate and Rhythm: Normal rate and regular rhythm.     Heart sounds: No murmur. No friction rub. No gallop.   Pulmonary:     Effort: Pulmonary effort is normal. No respiratory distress.     Breath sounds: Normal breath sounds. No wheezing or rales.  Abdominal:     General: Bowel sounds are normal. There is no distension.     Palpations: Abdomen is soft. There is no mass.     Tenderness: There is no abdominal tenderness. There is no guarding or rebound.  Musculoskeletal: Normal range of motion.  Neurological:     General: No focal deficit present.     Mental Status: She is alert and oriented to person, place, and time.     Cranial Nerves: No cranial nerve deficit.  Skin:    General: Skin is warm and dry.     Findings: No erythema.  Psychiatric:        Mood and Affect: Mood normal.        Behavior: Behavior normal.        Judgment: Judgment normal.     Consults: None  Significant Findings/ Diagnostic Studies: none  Procedures:  NST: Baseline FHR: 135 beats/min Variability: moderate Accelerations: present Decelerations: absent Tocometry: quiet  Interpretation:  INDICATIONS: rule out uterine contractions RESULTS:  A NST procedure was performed with FHR monitoring and a normal baseline established, appropriate time of 20-40 minutes of evaluation, and accels >2 seen w 15x15 characteristics.  Results show a REACTIVE NST.    Hospital Course: The patient was admitted to Labor and Delivery Triage for observation. She had normal blood pressures and other vital signs. The fetal tracing was  reassuring. By the time she arrived she had no complaints.  She was provided reassurance and states that she was satisfied that her concerns were addressed. She was discharged in stable conditions.  Discharge Condition: stable  Disposition: Discharge disposition: 01-Home or Self Care       Diet: Regular diet  Discharge Activity: Activity as tolerated   Allergies as of 09/25/2018   No Known Allergies     Medication List    TAKE these medications   aspirin 81 MG chewable tablet  Chew 81 mg by mouth daily.   labetalol 100 MG tablet Commonly known as: NORMODYNE Take 1 tablet (100 mg total) by mouth 2 (two) times daily.   prenatal multivitamin Tabs tablet Take 1 tablet by mouth daily at 12 noon.        Total time spent taking care of this patient: 20 minutes  Signed: Thomasene MohairStephen Favor Kreh, MD  09/25/2018, 10:57 PM

## 2018-09-25 NOTE — Addendum Note (Signed)
Addended by: Quintella Baton D on: 09/25/2018 10:41 AM   Modules accepted: Orders

## 2018-09-25 NOTE — Discharge Summary (Signed)
See Final Progress Note 

## 2018-09-29 ENCOUNTER — Ambulatory Visit
Admission: RE | Admit: 2018-09-29 | Discharge: 2018-09-29 | Disposition: A | Discharge status: Home / Self Care | Payer: Medicaid Other | Source: Ambulatory Visit | Attending: Obstetrics and Gynecology | Admitting: Obstetrics and Gynecology

## 2018-09-29 ENCOUNTER — Other Ambulatory Visit: Payer: Self-pay

## 2018-09-29 DIAGNOSIS — Z3689 Encounter for other specified antenatal screening: Secondary | ICD-10-CM | POA: Insufficient documentation

## 2018-09-29 DIAGNOSIS — E669 Obesity, unspecified: Secondary | ICD-10-CM | POA: Insufficient documentation

## 2018-09-29 DIAGNOSIS — O365930 Maternal care for other known or suspected poor fetal growth, third trimester, not applicable or unspecified: Secondary | ICD-10-CM

## 2018-09-29 DIAGNOSIS — O34219 Maternal care for unspecified type scar from previous cesarean delivery: Secondary | ICD-10-CM | POA: Insufficient documentation

## 2018-09-29 DIAGNOSIS — O0993 Supervision of high risk pregnancy, unspecified, third trimester: Secondary | ICD-10-CM | POA: Insufficient documentation

## 2018-09-29 DIAGNOSIS — Z3A37 37 weeks gestation of pregnancy: Secondary | ICD-10-CM | POA: Diagnosis not present

## 2018-09-29 DIAGNOSIS — O10013 Pre-existing essential hypertension complicating pregnancy, third trimester: Secondary | ICD-10-CM | POA: Insufficient documentation

## 2018-09-29 DIAGNOSIS — O99213 Obesity complicating pregnancy, third trimester: Secondary | ICD-10-CM

## 2018-10-02 ENCOUNTER — Other Ambulatory Visit: Payer: Self-pay

## 2018-10-02 DIAGNOSIS — O0990 Supervision of high risk pregnancy, unspecified, unspecified trimester: Secondary | ICD-10-CM

## 2018-10-03 ENCOUNTER — Ambulatory Visit (INDEPENDENT_AMBULATORY_CARE_PROVIDER_SITE_OTHER): Payer: Medicaid Other | Admitting: Certified Nurse Midwife

## 2018-10-03 ENCOUNTER — Other Ambulatory Visit: Payer: Self-pay

## 2018-10-03 VITALS — BP 130/72 | Wt 309.0 lb

## 2018-10-03 DIAGNOSIS — Z3A38 38 weeks gestation of pregnancy: Secondary | ICD-10-CM

## 2018-10-03 DIAGNOSIS — O10913 Unspecified pre-existing hypertension complicating pregnancy, third trimester: Secondary | ICD-10-CM

## 2018-10-03 LAB — POCT URINALYSIS DIPSTICK OB: Glucose, UA: NEGATIVE

## 2018-10-03 MED ORDER — HYDROXYZINE HCL 25 MG PO TABS: 25.0000 mg | ORAL_TABLET | Freq: Four times a day (QID) | ORAL | 0 refills | Status: DC | PRN

## 2018-10-03 NOTE — Progress Notes (Signed)
ROB/NST- no concerns 

## 2018-10-03 NOTE — Progress Notes (Signed)
HROB/NST at 38wk 2days: Complains of not being able to sleep at night and not taking naps during the day. Feeling more anxious. Also had diarrhea for 2 hours this AM after eating tacos last night. Some contractions when having diarrhea. Baby active. No bleeding. Continues on labetalol 100 mgm BID. BP today 130/72, trace proteinuria Last growth scan 6/15: EFW 5#5oz (8%)/ on 6/22 had normal Dopplers and BPP 8/8 NST today reactive with baseline 145 and accelerations to 160s, moderate variability CS and BTL scheduled for 7/2 Preop with Dr Gilman Schmidt and BPP/ Dopplers on 6/29 RX for Atarax 25 mgm for sleep or anxiety (can take 50 mgm for sleep if 25 mgm not effective)  Dalia Heading, CNM

## 2018-10-06 ENCOUNTER — Observation Stay
Admission: EM | Admit: 2018-10-06 | Discharge: 2018-10-06 | Disposition: A | Discharge status: Home / Self Care | Payer: Medicaid Other | Attending: Obstetrics and Gynecology | Admitting: Obstetrics and Gynecology

## 2018-10-06 ENCOUNTER — Encounter
Admission: RE | Admit: 2018-10-06 | Discharge: 2018-10-06 | Disposition: A | Discharge status: Home / Self Care | Payer: Medicaid Other | Source: Ambulatory Visit

## 2018-10-06 ENCOUNTER — Encounter: Payer: Self-pay | Admitting: Obstetrics and Gynecology

## 2018-10-06 ENCOUNTER — Ambulatory Visit
Admission: RE | Admit: 2018-10-06 | Discharge: 2018-10-06 | Disposition: A | Discharge status: Home / Self Care | Payer: Medicaid Other | Source: Ambulatory Visit | Attending: Obstetrics and Gynecology | Admitting: Obstetrics and Gynecology

## 2018-10-06 ENCOUNTER — Telehealth: Payer: Self-pay | Admitting: Obstetrics and Gynecology

## 2018-10-06 ENCOUNTER — Other Ambulatory Visit: Payer: Self-pay

## 2018-10-06 ENCOUNTER — Ambulatory Visit (INDEPENDENT_AMBULATORY_CARE_PROVIDER_SITE_OTHER): Payer: Medicaid Other | Admitting: Obstetrics and Gynecology

## 2018-10-06 VITALS — BP 140/82 | HR 103 | Ht 67.0 in | Wt 308.0 lb

## 2018-10-06 DIAGNOSIS — O0990 Supervision of high risk pregnancy, unspecified, unspecified trimester: Secondary | ICD-10-CM

## 2018-10-06 DIAGNOSIS — O1213 Gestational proteinuria, third trimester: Secondary | ICD-10-CM | POA: Insufficient documentation

## 2018-10-06 DIAGNOSIS — Z79899 Other long term (current) drug therapy: Secondary | ICD-10-CM | POA: Diagnosis not present

## 2018-10-06 DIAGNOSIS — O34219 Maternal care for unspecified type scar from previous cesarean delivery: Secondary | ICD-10-CM

## 2018-10-06 DIAGNOSIS — O99213 Obesity complicating pregnancy, third trimester: Secondary | ICD-10-CM | POA: Diagnosis not present

## 2018-10-06 DIAGNOSIS — O99210 Obesity complicating pregnancy, unspecified trimester: Secondary | ICD-10-CM

## 2018-10-06 DIAGNOSIS — O0993 Supervision of high risk pregnancy, unspecified, third trimester: Secondary | ICD-10-CM

## 2018-10-06 DIAGNOSIS — Z6841 Body Mass Index (BMI) 40.0 and over, adult: Secondary | ICD-10-CM

## 2018-10-06 DIAGNOSIS — O133 Gestational [pregnancy-induced] hypertension without significant proteinuria, third trimester: Secondary | ICD-10-CM | POA: Diagnosis not present

## 2018-10-06 DIAGNOSIS — Z3A38 38 weeks gestation of pregnancy: Secondary | ICD-10-CM | POA: Diagnosis not present

## 2018-10-06 DIAGNOSIS — O99343 Other mental disorders complicating pregnancy, third trimester: Secondary | ICD-10-CM | POA: Insufficient documentation

## 2018-10-06 DIAGNOSIS — O99333 Smoking (tobacco) complicating pregnancy, third trimester: Secondary | ICD-10-CM | POA: Diagnosis not present

## 2018-10-06 DIAGNOSIS — F419 Anxiety disorder, unspecified: Secondary | ICD-10-CM | POA: Insufficient documentation

## 2018-10-06 DIAGNOSIS — Z8249 Family history of ischemic heart disease and other diseases of the circulatory system: Secondary | ICD-10-CM | POA: Diagnosis not present

## 2018-10-06 DIAGNOSIS — Z7982 Long term (current) use of aspirin: Secondary | ICD-10-CM | POA: Diagnosis not present

## 2018-10-06 DIAGNOSIS — Z8679 Personal history of other diseases of the circulatory system: Secondary | ICD-10-CM

## 2018-10-06 DIAGNOSIS — F17210 Nicotine dependence, cigarettes, uncomplicated: Secondary | ICD-10-CM | POA: Insufficient documentation

## 2018-10-06 DIAGNOSIS — I10 Essential (primary) hypertension: Secondary | ICD-10-CM | POA: Diagnosis present

## 2018-10-06 DIAGNOSIS — B951 Streptococcus, group B, as the cause of diseases classified elsewhere: Secondary | ICD-10-CM

## 2018-10-06 DIAGNOSIS — O163 Unspecified maternal hypertension, third trimester: Secondary | ICD-10-CM | POA: Diagnosis present

## 2018-10-06 HISTORY — DX: Essential (primary) hypertension: I10

## 2018-10-06 LAB — CBC
HCT: 35.1 % — ABNORMAL LOW (ref 36.0–46.0)
Hemoglobin: 11.2 g/dL — ABNORMAL LOW (ref 12.0–15.0)
MCH: 25 pg — ABNORMAL LOW (ref 26.0–34.0)
MCHC: 31.9 g/dL (ref 30.0–36.0)
MCV: 78.3 fL — ABNORMAL LOW (ref 80.0–100.0)
Platelets: 389 10*3/uL (ref 150–400)
RBC: 4.48 MIL/uL (ref 3.87–5.11)
RDW: 14.4 % (ref 11.5–15.5)
WBC: 11.9 10*3/uL — ABNORMAL HIGH (ref 4.0–10.5)
nRBC: 0 % (ref 0.0–0.2)

## 2018-10-06 LAB — COMPREHENSIVE METABOLIC PANEL
ALT: 17 U/L (ref 0–44)
AST: 17 U/L (ref 15–41)
Albumin: 3.2 g/dL — ABNORMAL LOW (ref 3.5–5.0)
Alkaline Phosphatase: 105 U/L (ref 38–126)
Anion gap: 9 (ref 5–15)
BUN: 8 mg/dL (ref 6–20)
CO2: 19 mmol/L — ABNORMAL LOW (ref 22–32)
Calcium: 9.4 mg/dL (ref 8.9–10.3)
Chloride: 104 mmol/L (ref 98–111)
Creatinine, Ser: 0.59 mg/dL (ref 0.44–1.00)
GFR calc Af Amer: >60 mL/min (ref >60)
GFR calc non Af Amer: >60 mL/min (ref >60)
Glucose, Bld: 91 mg/dL (ref 70–99)
Potassium: 3.8 mmol/L (ref 3.5–5.1)
Sodium: 132 mmol/L — ABNORMAL LOW (ref 135–145)
Total Bilirubin: 0.3 mg/dL (ref 0.3–1.2)
Total Protein: 7.3 g/dL (ref 6.5–8.1)

## 2018-10-06 LAB — PROTEIN / CREATININE RATIO, URINE
Creatinine, Urine: 204 mg/dL
Protein Creatinine Ratio: 0.08 mg/mg{Cre} (ref 0.00–0.15)
Total Protein, Urine: 17 mg/dL

## 2018-10-06 NOTE — H&P (View-Only) (Signed)
Patient ID: Ashley Howell Foulks, female   DOB: 09/07/1990, 28 y.o.   MRN: [id number]  Reason for Consult: Pre-op Exam   Referred by [facility]  Subjective:     HPI:  Ashley Howell Wind is a 28 y.o. female . She presents today for a preoperative visit. No complaints. Feeling well. Denies headache, vision changes, RUQ pain.  Pregnancy #5 Problems (from 04/07/18 to present)    Problem Noted Resolved   Positive GBS test 09/21/2018 by Vena AustriaStaebler, Andreas, MD No   BMI 40.0-44.9, adult (HCC) 09/04/2018 by Conard NovakJackson, Stephen D, MD No   Supervision of high risk pregnancy, antepartum 05/26/2018 by Tresea MallGledhill, Jane, CNM No   Overview Addendum 09/08/2018 12:05 PM by Natale MilchSchuman, Christanna R, MD    [facility]inic [facility] Prenatal Labs  Dating By 8 wk u/Howell c/w approx LMP Blood type:  O positive   Genetic Screen 1 Screen: negative   AFP: negative   Antibody: negative  [facility] Fetal echo normal 3/2 Rubella: Immune Varicella: Non-immune  GTT Early: 125  Third trimester: 126 RPR:  Non-reactive   Rhogam NA HBsAg:  negative   TDaP vaccine  09/02/18 Flu Shot: fall 2019 HIV:  negative   Baby Food                                GBS:   Contraception Desires tubal Pap: Negative January 2019  CBB     CS/VBAC 2010, 2012, 2016 Begin APT at 34 wks  Support Person Jamario Maternal echo sched April         Obesity in pregnancy, antepartum 02/15/2015 by Farrel ConnersGutierrez, Colleen, CNM No   Previous cesarean delivery, antepartum condition or complication 02/08/2015 by Farrel ConnersGutierrez, Colleen, CNM No   Overview Addendum 09/08/2018 12:04 PM by Natale MilchSchuman, Christanna R, MD    Three prior Cesarean sections      History of cardiomegaly 10/28/2014 by Lady DeutscherJames, Andra, MD No        Past Medical History:  Diagnosis Date  . Anxiety   . GERD (gastroesophageal reflux disease)   . Heart defect    ASD  . Shortness of breath dyspnea    Family History  Problem Relation Age of Onset  . Hypertension Mother   .  Stroke Paternal Aunt   . Cancer Maternal Grandfather        Colon   Past Surgical History:  Procedure Laterality Date  . CESAREAN SECTION  10/28/2008  . CESAREAN SECTION N/A 02/22/2015   Procedure: CESAREAN SECTION;  Surgeon: Nadara Mustardobert P Harris, MD;  Location: [facility] ORS;  Service: Obstetrics;  Laterality: N/A;  . CESAREAN SECTION  04/13/2010    Short Social History:  Social History   Tobacco Use  . Smoking status: Current Every Day Smoker    Packs/day: 0.25    Types: Cigarettes    Last attempt to quit: 05/23/2014    Years since quitting: 4.3  . Smokeless tobacco: Never Used  Substance Use Topics  . Alcohol use: No    No Known Allergies  Current Outpatient Medications  Medication Sig Dispense Refill  . aspirin 81 MG chewable tablet Chew 81 mg by mouth daily.    . hydrOXYzine (ATARAX/VISTARIL) 25 MG tablet Take 1 tablet (25 mg total) by mouth every 6 (six) hours as needed for anxiety (Can take 25 mgm or 50 mgm for sleep). 30 tablet 0  . labetalol (NORMODYNE) 100 MG tablet Take  1 tablet (100 mg total) by mouth 2 (two) times daily. 60 tablet 4  . Prenatal Vit-Fe Fumarate-FA (PRENATAL MULTIVITAMIN) TABS tablet Take 1 tablet by mouth daily at 12 noon.     No current facility-administered medications for this visit.     Review of Systems  Constitutional: Negative for chills, fatigue, fever and unexpected weight change.  HENT: Negative for trouble swallowing.  Eyes: Negative for loss of vision.  Respiratory: Negative for cough, shortness of breath and wheezing.  Cardiovascular: Negative for chest pain, leg swelling, palpitations and syncope.  GI: Negative for abdominal pain, blood in stool, diarrhea, nausea and vomiting.  GU: Negative for difficulty urinating, dysuria, frequency and hematuria.  Musculoskeletal: Negative for back pain, leg pain and joint pain.  Skin: Negative for rash.  Neurological: Negative for dizziness, headaches, light-headedness, numbness and seizures.   Psychiatric: Negative for behavioral problem, confusion, depressed mood and sleep disturbance.        Objective:  Objective   Vitals:   10/06/18 0919  BP: 140/82  Pulse: (!) 103  Weight: (!) 308 lb (139.7 kg)  Height: 5\' 7"  (1.702 m)   Body mass index is 48.24 kg/m.  Physical Exam Vitals signs and nursing note reviewed.  Constitutional:      Appearance: She is well-developed.  HENT:     Head: Normocephalic and atraumatic.  Eyes:     Pupils: Pupils are equal, round, and reactive to light.  Cardiovascular:     Rate and Rhythm: Normal rate and regular rhythm.  Pulmonary:     Effort: Pulmonary effort is normal. No respiratory distress.  Skin:    General: Skin is warm and dry.  Neurological:     Mental Status: She is alert and oriented to person, place, and time.  Psychiatric:        Behavior: Behavior normal.        Thought Content: Thought content normal.        Judgment: Judgment normal.        Assessment/Plan:     28 yo [id number] 38 5/7 Discussed risks benefits and alternatives to cesarean section and bilateral tubal ligation. She understands the risks of bleeding, infection, damage to surrounding organs and the possibility of a cesarean hysterectomy. She understands the risk of blood transfusion. Questions an procedure explained in detail. Consents signed. She confirms her desire for sterilization.    Adrian Prows MD [facility] 10/06/2018 9:45 AM

## 2018-10-06 NOTE — OB Triage Note (Signed)
Pt. Presented to L/D triage from office with reported elevated blood pressure and protein in urine. No pain, bleeding, or LOF. No PIH symptoms. Last BP 133/89. Positive fetal movement. VSS.

## 2018-10-06 NOTE — Telephone Encounter (Signed)
Discussed with Dr. Glennon Mac and Dr. Lehman Prom regarding patient's elevated blood pressure in MFM office after having normal values on L&D and being ruled out for preeclampsia.   Will check her blood pressure at home twice a day with a home BP cuff that she has. She will call or come to the hospital if she has values more than 140/90, especially if they are > 160/110.  Discussed warning signs of preeclampsia and advised to come to the hospital if she has symptoms of swelling, headache, vision changes or RUQ pain.  She will follow up on Wednesday in office for a BP check.  Will keep surgery at planned time on Thursday.   Adrian Prows MD [facility] 10/06/2018 2:48 PM

## 2018-10-06 NOTE — Discharge Summary (Signed)
Physician Final Progress Note  Patient ID: Ashley Howell MRN: [id number] DOB/AGE: 28/04/1990 27 y.o.  Admit date: 10/06/2018 Admitting provider: Conard NovakStephen D Jackson, MD Discharge date: 10/06/2018   Admission Diagnoses: elevated blood pressure and proteinuria in office  Discharge Diagnoses:  Active Problems:   Elevated blood pressure affecting pregnancy in third trimester, antepartum IUP at 38 weeks Reactive NST Normal labs Stable blood pressure  History of Present Illness: The patient is a 28 y.o. female [phone] at 5463w5d who presents from the office with  Elevated blood pressure and proteinuria on urine dip. Her pregnancy has been complicated by gestational hypertension, atrial septal defect, obesity with BMI greater than 40. She takes anti-hypertensive medication as prescribed.   She denies headache, visual changes, epigastric pain, leakage of fluid, vaginal bleeding or contractions. She denies any s/s of illness.   She is scheduled for pre-op labs tomorrow and repeat c/section on Thursday.   Past Medical History:  Diagnosis Date  . Anxiety   . Atrial septal defect   . Heart defect    ASD  . Hypertension    ONLY DURING PREGNANCY  . Shortness of breath dyspnea     Past Surgical History:  Procedure Laterality Date  . CESAREAN SECTION  10/28/2008  . CESAREAN SECTION N/A 02/22/2015   Procedure: CESAREAN SECTION;  Surgeon: Ashley Mustardobert P Harris, MD;  Location: [facility] ORS;  Service: Obstetrics;  Laterality: N/A;  . CESAREAN SECTION  04/13/2010    No current facility-administered medications on file prior to encounter.    Current Outpatient Medications on File Prior to Encounter  Medication Sig Dispense Refill  . aspirin 81 MG chewable tablet Chew 81 mg by mouth daily.    Marland Kitchen. labetalol (NORMODYNE) 100 MG tablet Take 1 tablet (100 mg total) by mouth 2 (two) times daily. 60 tablet 4  . Prenatal Vit-Fe Fumarate-FA (PRENATAL MULTIVITAMIN) TABS tablet Take 1 tablet by mouth daily at  12 noon.    . hydrOXYzine (ATARAX/VISTARIL) 25 MG tablet Take 1 tablet (25 mg total) by mouth every 6 (six) hours as needed for anxiety (Can take 25 mgm or 50 mgm for sleep). (Patient not taking: Reported on 10/06/2018) 30 tablet 0    No Known Allergies  Social History   Socioeconomic History  . Marital status: Single    Spouse name: Not on file  . Number of children: 3  . Years of education: Not on file  . Highest education level: Not on file  Occupational History  . Occupation: CNA  Social Needs  . Financial resource strain: Not hard at all  . Food insecurity    Worry: Never true    Inability: Never true  . Transportation needs    Medical: No    Non-medical: No  Tobacco Use  . Smoking status: Current Every Day Smoker    Packs/day: 0.25    Years: 2.00    Pack years: 0.50    Types: Cigarettes  . Smokeless tobacco: Never Used  Substance and Sexual Activity  . Alcohol use: No  . Drug use: No  . Sexual activity: Yes    Birth control/protection: Surgical    Comment: BTL  Lifestyle  . Physical activity    Days per week: 4 days    Minutes per session: 30 min  . Stress: Not at all  Relationships  . Social Musicianconnections    Talks on phone: Three times a week    Gets together: Twice a week    Attends religious service: 1  to 4 times per year    Active member of club or organization: No    Attends meetings of clubs or organizations: Never    Relationship status: Living with partner  . Intimate partner violence    Fear of current or ex partner: No    Emotionally abused: No    Physically abused: No    Forced sexual activity: No  Other Topics Concern  . Not on file  Social History Narrative  . Not on file    Family History  Problem Relation Age of Onset  . Hypertension Mother   . Stroke Paternal Aunt   . Cancer Maternal Grandfather        Colon     Review of Systems  Constitutional: Negative.   HENT: Negative.   Eyes: Negative.   Respiratory: Negative.    Cardiovascular: Negative.   Gastrointestinal: Negative.   Genitourinary: Negative.   Musculoskeletal: Negative.   Skin: Negative.   Neurological: Negative.   Endo/Heme/Allergies: Negative.   Psychiatric/Behavioral: Negative.      Physical Exam: BP 133/65   Pulse 88   Temp 98.3 F (36.8 C) (Oral)   Resp 18   Ht 5\' 7"  (1.702 m)   Wt (!) 139.7 kg   LMP 01/08/2018 (Approximate)   BMI 48.24 kg/m   Constitutional: Well nourished, well developed female in no acute distress.  HEENT: normal Skin: Warm and dry.  Cardiovascular: Regular rate and rhythm.   Extremity: trace edema  Respiratory: Clear to auscultation bilateral. Normal respiratory effort Abdomen: FHT present Back: no CVAT Neuro: DTRs 2+, Cranial nerves grossly intact Psych: Alert and Oriented x3. No memory deficits. Normal mood and affect.  MS: normal gait, normal bilateral lower extremity ROM/strength/stability.  Toco: negative Fetal well being: 140 bpm baseline, moderate variability, +accelerations, -decelerations  Pelvic exam: deferred  Consults: None  Significant Findings/ Diagnostic Studies: labs:   Results for [name], Ashley Howell (MRN [id number]) as of 10/06/2018 13:06  Ref. Range 10/06/2018 10:50 10/06/2018 12:04  COMPREHENSIVE METABOLIC PANEL Unknown Rpt (A)   Sodium Latest Ref Range: 135 - 145 mmol/L 132 (L)   Potassium Latest Ref Range: 3.5 - 5.1 mmol/L 3.8   Chloride Latest Ref Range: 98 - 111 mmol/L 104   CO2 Latest Ref Range: 22 - 32 mmol/L 19 (L)   Glucose Latest Ref Range: 70 - 99 mg/dL 91   BUN Latest Ref Range: 6 - 20 mg/dL 8   Creatinine Latest Ref Range: 0.44 - 1.00 mg/dL 0.59   Calcium Latest Ref Range: 8.9 - 10.3 mg/dL 9.4   Anion gap Latest Ref Range: 5 - 15  9   Alkaline Phosphatase Latest Ref Range: 38 - 126 U/L 105   Albumin Latest Ref Range: 3.5 - 5.0 g/dL 3.2 (L)   AST Latest Ref Range: 15 - 41 U/L 17   ALT Latest Ref Range: 0 - 44 U/L 17   Total Protein Latest Ref Range: 6.5 - 8.1  g/dL 7.3   Total Bilirubin Latest Ref Range: 0.3 - 1.2 mg/dL 0.3   GFR, Est Non African American Latest Ref Range: >60 mL/min >60   GFR, Est African American Latest Ref Range: >60 mL/min >60   WBC Latest Ref Range: 4.0 - 10.5 K/uL 11.9 (H)   RBC Latest Ref Range: 3.87 - 5.11 MIL/uL 4.48   Hemoglobin Latest Ref Range: 12.0 - 15.0 g/dL 11.2 (L)   HCT Latest Ref Range: 36.0 - 46.0 % 35.1 (L)   MCV Latest Ref Range:  80.0 - 100.0 fL 78.3 (L)   MCH Latest Ref Range: 26.0 - 34.0 pg 25.0 (L)   MCHC Latest Ref Range: 30.0 - 36.0 g/dL 16.131.9   RDW Latest Ref Range: 11.5 - 15.5 % 14.4   Platelets Latest Ref Range: 150 - 400 K/uL 389   nRBC Latest Ref Range: 0.0 - 0.2 % 0.0   Total Protein, Urine Latest Units: mg/dL  17  Protein Creatinine Ratio Latest Ref Range: 0.00 - 0.15 mg/mgCre  0.08  Creatinine, Urine Latest Units: mg/dL  [id number]    Procedures: NST  Hospital Course: The patient was admitted to Labor and Delivery Triage for observation.   Discharge Condition: good  Disposition: Discharge disposition: 01-Home or Self Care     Diet: Regular diet  Discharge Activity: Activity as tolerated  Discharge Instructions    Discharge activity:  No Restrictions   Complete by: As directed    [facility] Return for scheduled cesarean section   Discharge diet:  No restrictions   Complete by: As directed    Fetal Kick Count:  Lie on our left side for one hour after a meal, and count the number of times your baby kicks.  If it is less than 5 times, get up, move around and drink some juice.  Repeat the test 30 minutes later.  If it is still less than 5 kicks in an hour, notify your doctor.   Complete by: As directed    LABOR:  When conractions begin, you should start to time them from the beginning of one contraction to the beginning  of the next.  When contractions are 5 - 10 minutes apart or less and have been regular for at least an hour, you should call your health care provider.    Complete by: As directed    No sexual activity restrictions   Complete by: As directed    Notify physician for bleeding from the vagina   Complete by: As directed    Notify physician for blurring of vision or spots before the eyes   Complete by: As directed    Notify physician for chills or fever   Complete by: As directed    Notify physician for fainting spells, "black outs" or loss of consciousness   Complete by: As directed    Notify physician for increase in vaginal discharge   Complete by: As directed    Notify physician for leaking of fluid   Complete by: As directed    Notify physician for pain or burning when urinating   Complete by: As directed    Notify physician for pelvic pressure (sudden increase)   Complete by: As directed    Notify physician for severe or continued nausea or vomiting   Complete by: As directed    Notify physician for sudden gushing of fluid from the vagina (with or without continued leaking)   Complete by: As directed    Notify physician for sudden, constant, or occasional abdominal pain   Complete by: As directed    Notify physician if baby moving less than usual   Complete by: As directed      Allergies as of 10/06/2018   No Known Allergies     Medication List    STOP taking these medications   hydrOXYzine 25 MG tablet Commonly known as: ATARAX/VISTARIL     TAKE these medications   aspirin 81 MG chewable tablet Chew 81 mg by mouth daily.   labetalol 100  MG tablet Commonly known as: NORMODYNE Take 1 tablet (100 mg total) by mouth 2 (two) times daily.   prenatal multivitamin Tabs tablet Take 1 tablet by mouth daily at 12 noon.       Total time spent taking care of this patient: 15 minutes  Signed: Tresea MallJane Jayven Howell, CNM  10/06/2018, 12:58 PM

## 2018-10-06 NOTE — Patient Instructions (Addendum)
Your procedure is scheduled on: 10-09-18 THURSDAY Report to MEDICAL MALL SCREENING DESK AND THEN PROCEED TO LABOR AND DELIVERY ON 3RD FLOOR-ARRIVE AT 5:30 AM   Remember: Instructions that are not followed completely may result in serious medical risk, up to and including death, or upon the discretion of your surgeon and anesthesiologist your surgery may need to be rescheduled.    _x___ 1. Do not eat food after midnight the night before your procedure. NO GUM OR CANDY AFTER MIDNIGHT. You may drink clear liquids up to 2 hours before you are scheduled to arrive at the hospital for your procedure.  Do not drink clear liquids within 2 hours of your scheduled arrival to the hospital.  Clear liquids include  --Water or Apple juice without pulp  --Clear carbohydrate beverage such as ClearFast or Gatorade  --Black Coffee or Clear Tea (No milk, no creamers, do not add anything to the coffee or Tea   ____Ensure clear carbohydrate drink on the way to the hospital for bariatric patients  ____Ensure clear carbohydrate drink 3 hours before surgery for Dr Rutherford NailByrnett's patients if physician instructed.  .     __x__ 2. No Alcohol for 24 hours before or after surgery.   __x__3. No Smoking or e-cigarettes for 24 prior to surgery.  Do not use any chewable tobacco products for at least 6 hour prior to surgery   ____  4. Bring all medications with you on the day of surgery if instructed.    __x__ 5. Notify your doctor if there is any change in your medical condition     (cold, fever, infections).    x___6. On the morning of surgery brush your teeth with toothpaste and water.  You may rinse your mouth with mouth wash if you wish.  Do not swallow any toothpaste or mouthwash.   Do not wear jewelry, make-up, hairpins, clips or nail polish.  Do not wear lotions, powders, or perfumes. You may wear deodorant.  Do not shave 48 hours prior to surgery. Men may shave face and neck.  Do not bring valuables to the  hospital.    [facility] is not responsible for any belongings or valuables.               Contacts, dentures or bridgework may not be worn into surgery.  Leave your suitcase in the car. After surgery it may be brought to your room.  For patients admitted to the hospital, discharge time is determined by your treatment team.  _  Patients discharged the day of surgery will not be allowed to drive home.  You will need someone to drive you home and stay with you the night of your procedure.    Please read over the following fact sheets that you were given:   [facility] Preparing for [facility]/INCENTIVE SPIROMETER  _x___ TAKE THE FOLLOWING MEDICATION THE MORNING OF YOUR SURGERY WITH A SMALL SIP OF WATER. These include:  1. LABETALOL  2.  3.  4.  5.  6.  ____Fleets enema or Magnesium Citrate as directed.   _x___ Use CHG Soap or sage wipes as directed on instruction sheet-AVOID NIPPLE AND PRIVATE AREA   ____ Use inhalers on the day of surgery and bring to hospital day of surgery  ____ Stop Metformin and Janumet 2 days prior to surgery.    ____ Take 1/2 of usual insulin dose the night before surgery and none on the morning surgery.   _x___ Follow recommendations from Cardiologist, Pulmonologist or  PCP regarding stopping Aspirin, Coumadin, Plavix ,Eliquis, Effient, or Pradaxa, and Pletal-ASK DR YQMGNOI ABOUT STOPPING YOUR ASPIRIN  X____Stop Anti-inflammatories such as Advil, Aleve, Ibuprofen, Motrin, Naproxen, Naprosyn, Goodies powders or aspirin products NOW-OK to take Tylenol    ____ Stop supplements until after surgery.     ____ Bring C-Pap to the hospital.

## 2018-10-06 NOTE — Pre-Procedure Instructions (Signed)
- in this encounter Patient Instructions - Ward, Lajoyce Cornersary Cecile, MD - 02/18/2015 10:40 AM EST  1. ?PFO (patent foramen ovale) by outside echo --RV normal in size --plan echo with bubble once she has delivered  2. Normal pregnancy in third trimester --no concerns for labor and delivery  Follow up in March with echo     Progress Notes - in this encounter Ward, Lajoyce Cornersary Cecile, MD - 02/18/2015 10:40 AM EST Formatting of this note may be different from the original. Ref[facility]: [facility] No address on file  Ashley Howell is a 28 y.o. female with no past med hx, who presents for evaluation of a ? PFO and pregnancy. She is currently [redacted] weeks pregnant with 3rd child. She carried other two kids to term and had C sections for obstetric reasons.  [facility] --it showed possible PFO or ASD But No shunt. She was referred her for further evaluation.  She notes that she is more short of breath with this pregnancy than others, but says she has more weight on her than in the past. Dyspnea with bending over, and with exertion while doing chores. Has to stop and rest. Prior to pregnancy had unlimited exercoise tolerance.   Patient Active Problem List  Diagnosis  . PFO (patent foramen ovale) by outside echo  . Normal pregnancy in third trimester   Current Outpatient Prescriptions  Medication Sig Dispense Refill  . ferrous sulfate 325 (65 FE) MG tablet Take 325 mg by mouth daily with breakfast.    No current facility-administered medications for this visit.   Patient reports that she has never smoked. She does not have any smokeless tobacco history on file.  REVIEW OF SYSTEMS:  Gen - no wt loss, no fatigue, no F/C/S.  HEENT - no eye problems, no hearing problems, no swallowing problems. Neck - no thyroid problem, no neck swelling. Lungs - no cough, no sputum, no wheezing. CV - See HPI; no palpitations, no syncope. Abd - no reflux,  no fullness, no constipation, no diarrhea, no GI bleeding GU - no difficulty urinating, no hematuria. Ext - no edema, no myalgia, no joint pain that limits exertion. Neuro -no confusion, no weakness, no problems with speech.  Skin -no bruising, no rash  Visit Vitals  . BP 124/66 (BP Location: Left upper arm, Patient Position: Sitting, BP Cuff Size: Large Adult)  . Pulse 111  . Temp 36.3 C (97.4 F) (Oral)  . Resp 20  . Ht 172 cm (5' 7.72")  . Wt (!) 130.5 kg (287 lb 12.8 oz)  . SpO2 99%  . BMI 44.12 kg/m2   General Appearance: Alert, cooperative, no distress, appears stated age  HEENT: PERRL,oropharynx clear  Neck: Carotids 2+ bilaterally, no carotid bruits, JVD is normal  Lungs: Clear to auscultation bilaterally, respirations unlabored  Heart: Regular rate and rhythm, normal S1 and S2, 2/6 short systolic murmur, non-displaced PMI, no RV heave  Abdomen: Gravid, non-tender, bowel sounds active, no masses, no organomegaly  Extremities: Extremities normal, no cyanosis or edema.  Pulses: Dorsalis pedis 2+ and symmetric bilaterally  Skin: No lower extremity rashes or ulcers  Neurologic: Alert, interactive, and appropriate, grossly moving all 4 extremities  [facility] Study Conclusions  - Left ventricle: The cavity size was normal. Systolic function was normal. The estimated ejection fraction was in the range of 55% to 60%. Wall motion was normal; there were no regional wall motion abnormalities. Left ventricular diastolic  function parameters were normal. - Aortic valve: Valve area (Vmax): 2.49 cm^2. - Left atrium: The atrium was mildly dilated. - Atrial septum: There was a patent foramen ovale. Echo contrast study showed no right-to-left atrial level shunt, at baseline or with provocation.  Impressions:  - Possible atrial-septal defec (ASD) with left to right shunt present. Consider f/u echo.  Impression and Plan: Ashley Howell is a 28 y.o. female who  presents for a new patient visit.    1. ?PFO (patent foramen ovale) by outside echo --RV normal in size --plan echo with bubble once she has delivered  2. Normal pregnancy in third trimester --no concerns for labor and delivery  Follow up in March with echo    Plan of Treatment - as of this encounter Scheduled Tests Scheduled Tests  Name Priority Associated Diagnoses Order Schedule  Echocardiogram 2D complete Routine PFO (patent foramen ovale) by outside echo  Normal pregnancy in third trimester  Expected: 06/18/2015, Expires: 02/17/2017   Visit Diagnoses  Diagnosis  PFO (patent foramen ovale) by outside echo - Primary  Ostium secundum type atrial septal defect   Normal pregnancy in third trimester   Historical Medications - added in this encounter This list may reflect changes made after this encounter.  Medication Sig. Disp. Refills Start Date End Date  ferrous sulfate 325 (65 FE) MG tablet  Take 325 mg by mouth daily with breakfast.       Images  Patient Contacts  Contact Name Contact Address Communication Relationship to Patient  Flanagin,Glenda Unknown [phone] [facility]) Parent, Emergency Contact  Document Information  Primary Care Provider Other Service Providers Document Coverage Dates  [facility] (Nov. 07, 2016November 07, 2016 - Present) DM: [id number]   Nov. 11, 2016November 11, 2016   [place], [place] [address]   Encounter Providers Encounter Date  Adelfa Koh Ward, MD (Attending) [phone] (Work) [phone] (Fax) [place], [place] [address] Cardiovascular Disease Nov. 11, 2016November 11, 2016

## 2018-10-06 NOTE — Pre-Procedure Instructions (Signed)
Patient Instructions - documented in this encounter Patient Instructions Ashley Howell, Ashley Gary, MD - 06/09/2018 10:00 AM EST  Dennison NancyStephen G. Miller, MD Fetal Cardiology  [facility]  Thank you allowing us to participate in the care of your baby.   Diagnoses: (O35.9XX0) Normal fetal echocardiogram   Based on today's findings, no changes to usual obstetric care are necessary.  Please follow-up with your obstetric providers as scheduled. We have not set up another appointment for you today.   Please call if you have any questions or concerns regarding our visit today.  NORMAL FETAL ECHOCARDIOGRAM Today you had an ultrasound (echocardiogram) of your baby's heart. Based on all of the images, everything appears normal and is developing as expected for this point in your pregnancy. We therefore feel that no further testing is needed at this time.  Despite today's normal findings, it is impossible to rule out all heart problems before birth. This is because the blood flow before birth is more complicated than after birth.  ? Babies get oxygen from the placenta instead of the lungs ? High-oxygen (red) blood from placenta comes back to the right side of the heart ? Red blood crosses to the left heart through a hole between the upper chambers called a foramen ovale (PFO) ? The PFO lets the red blood go to the body. ? Low-oxygen (blue) blood stays on the right side of the heart. ? Blue blood crosses a special vessel after leaving the heart called a ductus arteriosus (PDA) ? The PDA lets blue blood skip the lungs and go back to the placenta ? After birth the PFO and PDA should close, but sometimes they do not. ? It is impossible to predict in which children the PFO or PDA will remain open.  In addition, fetal echocardiography also can miss other heart defects due to the small size of the fetal heart, including: ? Small or medium size holes between the upper  or bottom chambers ? Minor valve abnormalities ? Postnatal development of coarctation of the aorta (narrowing of vessel going to the body) ? Other rare arterial or venous abnormalities.  If these defects are present, there should be findings after birth that will indicate to your child's doctor the need for further evaluation.  After delivery, should any of these findings be present, your baby's heart can be re-evaluated at that time.      Electronically signed by Ashley Howell, Ashley Gary, MD at 06/09/2018 11:27 AM EST     Progress Notes - documented in this encounter Ashley Howell, Ashley Gary, MD - 06/09/2018 10:00 AM EST Formatting of this note might be different from the original. Fetal Cardiology [facility]  [address], [address] [place], [place] [address] Ph [phone] Appt [phone]    Date of visit: 06/09/2018   Patient name: Ashley MeyerSamantha Howell  Patient DOB: 06/05/1990   [facility] [place]  History: I had the pleasure meeting Ashley Howell, who is referred for fetal cardiac consultation and fetal echocardiography today. The indication for today's visit includes a family history of congenital heart disease.  Ashley Howell is a 28 y.o. year old woman who is currently 4430w5d pregnant with a single female fetus. Estimated Date of Delivery: 10/15/18. Complications of her current pregnancy include: hypertension. Ashley Howell feels well today. She denies any shortness of breath abdominal cramping, contractions, bleeding, or swelling of the extremities. She notes frequent fetal movement.   The patient's medical and obstetrical records, as well  as previous OB ultrasound reports were reviewed in detail, and are in agreement with the above noted history. No obvious anatomic defects have been noted on obstetrical ultrasound.   Past history: Past Medical History:  Diagnosis Date  . Anxiety attack  . Obesity  . PFO (patent foramen ovale)   Past Surgical History:   Procedure Laterality Date  . CESAREAN SECTION   Medications: Current Outpatient Medications  Medication Sig Last Dose  . ferrous sulfate 325 (65 FE) MG tablet Take 325 mg by mouth daily with breakfast.  Taking   Family history: Family History  Problem Relation Age of Onset  . Irregular Heart Beat (Arrhythmia) Neg Hx  . Congenital heart disease Neg Hx  . SIDS Neg Hx  . Sudden death (unexpected death due to unknown cause) Neg Hx   Social history: Social History   Tobacco Use  . Smoking status: Never Smoker  Substance Use Topics  . Alcohol use: Not on file  . Drug use: Not on file   Objective: Blood pressure (!) 141/93, height 172 cm (5' 7.72"), weight (!) 130.5 kg (287 lb 11.2 oz).  Patient is well appearing and in no distress Abdomen is significant for a gravid uterus but is otherwise soft and non-tender.  Extremities- no swelling or edema Neuro - grossly intact without focal deficits  Fetal echocardiography:  A complete transbadominal fetal echocardiogram was ordered and reviewed today. Imaging was technically challenging due to maternal habitus and fetal position.  There was normal situs solitus with levocardia. Systemic venous connections were normal. At least one right and one left pulmonary vein connected normally to the left atrium. Atrial size was normal. There was a foramen ovale seen which bowed from right to left.  The atrioventricular valves were normal in size and function.  The right and left ventricles were of normal size with normal wall thickness and systolic function. No obvious septal defects were seen.  The great vessel connections were normal and the ductal and aortic arches were normal.  No pericardial effusion was seen.  No valve stenosis was seen by color Doppler, with normal spectral Doppler velocities.  There was normal right to left ductal shunting. There was normal flow in the transverse arch. The aortic arch is left-sided.  No arrhythmia was  detected. The fetal heart rate was normal.   Diagnostic impressions:  Normal fetal cardiac structure and function for gestational age  Discussion:  I am happy to report that Ashley Howell fetal heart appears normal. These results were discussed in detail, and all questions were answered.   The limitations to fetal echocardiography include the presence of small to moderate atrial and ventricular septal defects, mild valve abnormalities, persistence of the ductus arteriosus after birth, postnatal development of coarctation of the aorta, and other cardiac and vascular anomalies too subtle to be imaged prenatally. These limitations were discussed with the patient and her family.   Recommendations: 1. Changes to prenatal care: None  2. Changes to delivery planning: None  3. Further fetal cardiac imaging is not required at this time.   It was my pleasure to meet and evaluate Ashley Howell today.   I was personally with the patient for 15 minutes. More than 50% of this time was spent doing counseling.   Thank you very much for referring her to the Fetal Cardiology Clinic at [facility]. If there are any questions or concerns regarding this evaluation, please do not hesitate to contact me.  Dennison NancyStephen G. Miller MD Associate Professor, Pediatric Cardiology Director, Fetal Cardiology [facility] P: [phone] F: [phone] Email: [email or web address]    Electronically signed by Ashley Howell, Ashley Gary, MD at 06/09/2018 11:32 AM EST   Plan of Treatment - documented as of this encounter Not on file   Visit Diagnoses - documented in this encounter Diagnosis  Suspected fetal anomaly, antepartum, not applicable or unspecified fetus - Primary    Historical Medications - added in this encounter This list may reflect changes made after this encounter.  Medication Sig Dispensed Refills Start Date End Date  prenatal vit  no.124-iron-folic (PRENATAL VITAMIN) 27 mg iron- 800 mcg Tab  Take by mouth  0    labetaloL (TRANDATE) 100 MG tablet  Take by mouth  0    aspirin 81 MG chewable tablet  Take by mouth  0    Images  Patient Contacts  Contact Name Contact Address Communication Relationship to Patient  Ruben GottronGlenda [place] Unknown [phone] [facility](Home) Parent, Emergency Contact  Document Information  Primary Care Provider Other Service Providers Document Coverage Dates  [facility] (Nov. 07, 2016November 07, 2016 - Present) DM: [id number]   Mar. 02, 2020March 02, 2020   Custodian Organization  [facility] [address] [place], [place] [address]   Encounter Providers Encounter Date  Ashley ForwardStephen Howell Miller, MD (Attending) DM: [phone] (Work) [phone] (Fax) [address], [address] [place], [place] [address] Pediatric Cardiology Mar. 02, 2020March 02, 2020

## 2018-10-06 NOTE — Progress Notes (Signed)
Patient  for fetal biophysical profile (BPP) and umbilical artery Dopplers for maternal HTN on medication labetalol 200mg  bid , FGR 8th percentile , BMI 48 , prior cesarean x 3 , planning# 4 on 7/2 with BTL   There is a singleton gestation with normal amniotic fluid volume. Pt is now 65 5/7   The BPP was noted to be 8/8, which was reassuring.  The umbilical artery Doppler studies were within normal limits for gestational age (S/D = 2.4 58%  ).  Her BP was labile with a BP of 153/106 at presentation and dropped to 130/80 range with resting during u/s .   Of note her BP was high in clinic at [facility]  today but improved during observation on L&D - her peeclampsia labs were normal I spoke with Dr Glennon Mac that given her mutliple risk factors including FGR and HTN on meds that moving to delivery prior to 39 weeks could be indicated . Patient just ate lunch. I advised her to rest on her left side at home. [facility] will contact her about surgery timing.    Gatha Mayer

## 2018-10-06 NOTE — Progress Notes (Signed)
 Patient ID: Ashley Howell, female   DOB: 12/25/1990, 27 y.o.   MRN: [id number]  Reason for Consult: Pre-op Exam   Referred by [facility]  Subjective:     HPI:  Ashley Howell is a 27 y.o. female . She presents today for a preoperative visit. No complaints. Feeling well. Denies headache, vision changes, RUQ pain.  Pregnancy #5 Problems (from 04/07/18 to present)    Problem Noted Resolved   Positive GBS test 09/21/2018 by Staebler, Andreas, MD No   BMI 40.0-44.9, adult (HCC) 09/04/2018 by Jackson, Stephen D, MD No   Supervision of high risk pregnancy, antepartum 05/26/2018 by Gledhill, Jane, CNM No   Overview Addendum 09/08/2018 12:05 PM by Schuman, Christanna R, MD    [facility] Prenatal Labs  Dating By 8 wk u/s c/w approx LMP Blood type:  O positive   Genetic Screen 1 Screen: negative   AFP: negative   Antibody: negative  Anatomic US Complete at DP 3/9 Fetal echo normal 3/2 Rubella: Immune Varicella: Non-immune  GTT Early: 125  Third trimester: 126 RPR:  Non-reactive   Rhogam NA HBsAg:  negative   TDaP vaccine  09/02/18 Flu Shot: fall 2019 HIV:  negative   Baby Food                                GBS:   Contraception Desires tubal Pap: Negative January 2019  CBB     CS/VBAC 2010, 2012, 2016 Begin APT at 34 wks  Support Person Jamario Maternal echo sched April         Obesity in pregnancy, antepartum 02/15/2015 by Gutierrez, Colleen, CNM No   Previous cesarean delivery, antepartum condition or complication 02/08/2015 by Gutierrez, Colleen, CNM No   Overview Addendum 09/08/2018 12:04 PM by Schuman, Christanna R, MD    Three prior Cesarean sections      History of cardiomegaly 10/28/2014 by James, Andra, MD No        Past Medical History:  Diagnosis Date  . Anxiety   . GERD (gastroesophageal reflux disease)   . Heart defect    ASD  . Shortness of breath dyspnea    Family History  Problem Relation Age of Onset  . Hypertension Mother   .  Stroke Paternal Aunt   . Cancer Maternal Grandfather        Colon   Past Surgical History:  Procedure Laterality Date  . CESAREAN SECTION  10/28/2008  . CESAREAN SECTION N/A 02/22/2015   Procedure: CESAREAN SECTION;  Surgeon: Robert P Harris, MD;  Location: [facility] ORS;  Service: Obstetrics;  Laterality: N/A;  . CESAREAN SECTION  04/13/2010    Short Social History:  Social History   Tobacco Use  . Smoking status: Current Every Day Smoker    Packs/day: 0.25    Types: Cigarettes    Last attempt to quit: 05/23/2014    Years since quitting: 4.3  . Smokeless tobacco: Never Used  Substance Use Topics  . Alcohol use: No    No Known Allergies  Current Outpatient Medications  Medication Sig Dispense Refill  . aspirin 81 MG chewable tablet Chew 81 mg by mouth daily.    . hydrOXYzine (ATARAX/VISTARIL) 25 MG tablet Take 1 tablet (25 mg total) by mouth every 6 (six) hours as needed for anxiety (Can take 25 mgm or 50 mgm for sleep). 30 tablet 0  . labetalol (NORMODYNE) 100 MG tablet Take   1 tablet (100 mg total) by mouth 2 (two) times daily. 60 tablet 4  . Prenatal Vit-Fe Fumarate-FA (PRENATAL MULTIVITAMIN) TABS tablet Take 1 tablet by mouth daily at 12 noon.     No current facility-administered medications for this visit.     Review of Systems  Constitutional: Negative for chills, fatigue, fever and unexpected weight change.  HENT: Negative for trouble swallowing.  Eyes: Negative for loss of vision.  Respiratory: Negative for cough, shortness of breath and wheezing.  Cardiovascular: Negative for chest pain, leg swelling, palpitations and syncope.  GI: Negative for abdominal pain, blood in stool, diarrhea, nausea and vomiting.  GU: Negative for difficulty urinating, dysuria, frequency and hematuria.  Musculoskeletal: Negative for back pain, leg pain and joint pain.  Skin: Negative for rash.  Neurological: Negative for dizziness, headaches, light-headedness, numbness and seizures.   Psychiatric: Negative for behavioral problem, confusion, depressed mood and sleep disturbance.        Objective:  Objective   Vitals:   10/06/18 0919  BP: 140/82  Pulse: (!) 103  Weight: (!) 308 lb (139.7 kg)  Height: 5\' 7"  (1.702 m)   Body mass index is 48.24 kg/m.  Physical Exam Vitals signs and nursing note reviewed.  Constitutional:      Appearance: She is well-developed.  HENT:     Head: Normocephalic and atraumatic.  Eyes:     Pupils: Pupils are equal, round, and reactive to light.  Cardiovascular:     Rate and Rhythm: Normal rate and regular rhythm.  Pulmonary:     Effort: Pulmonary effort is normal. No respiratory distress.  Skin:    General: Skin is warm and dry.  Neurological:     Mental Status: She is alert and oriented to person, place, and time.  Psychiatric:        Behavior: Behavior normal.        Thought Content: Thought content normal.        Judgment: Judgment normal.        Assessment/Plan:     28 yo [id number] 38 5/7 Discussed risks benefits and alternatives to cesarean section and bilateral tubal ligation. She understands the risks of bleeding, infection, damage to surrounding organs and the possibility of a cesarean hysterectomy. She understands the risk of blood transfusion. Questions an procedure explained in detail. Consents signed. She confirms her desire for sterilization.    Adrian Prows MD [facility], [facility] 10/06/2018 9:45 AM

## 2018-10-07 ENCOUNTER — Ambulatory Visit
Admission: RE | Admit: 2018-10-07 | Discharge: 2018-10-07 | Disposition: A | Discharge status: Home / Self Care | Payer: Medicaid Other | Source: Ambulatory Visit | Attending: Obstetrics and Gynecology | Admitting: Obstetrics and Gynecology

## 2018-10-07 DIAGNOSIS — Z1159 Encounter for screening for other viral diseases: Secondary | ICD-10-CM | POA: Diagnosis not present

## 2018-10-08 ENCOUNTER — Ambulatory Visit: Payer: Medicaid Other

## 2018-10-08 ENCOUNTER — Other Ambulatory Visit: Payer: Self-pay

## 2018-10-08 LAB — NOVEL CORONAVIRUS, NAA (HOSP ORDER, SEND-OUT TO REF LAB; TAT 18-24 HRS): SARS-CoV-2, NAA: NOT DETECTED

## 2018-10-09 ENCOUNTER — Encounter: Payer: Self-pay | Admitting: *Deleted

## 2018-10-09 ENCOUNTER — Encounter
Admission: RE | Disposition: A | Discharge status: Home / Self Care | Payer: Self-pay | Source: Home / Self Care | Attending: Obstetrics and Gynecology

## 2018-10-09 ENCOUNTER — Inpatient Hospital Stay: Payer: Medicaid Other | Admitting: Anesthesiology

## 2018-10-09 ENCOUNTER — Inpatient Hospital Stay
Admission: RE | Admit: 2018-10-09 | Discharge: 2018-10-11 | DRG: 787 | Disposition: A | Discharge status: Home / Self Care | Payer: Medicaid Other | Attending: Obstetrics and Gynecology | Admitting: Obstetrics and Gynecology

## 2018-10-09 DIAGNOSIS — B951 Streptococcus, group B, as the cause of diseases classified elsewhere: Secondary | ICD-10-CM

## 2018-10-09 DIAGNOSIS — O34211 Maternal care for low transverse scar from previous cesarean delivery: Secondary | ICD-10-CM | POA: Diagnosis present

## 2018-10-09 DIAGNOSIS — Z7982 Long term (current) use of aspirin: Secondary | ICD-10-CM | POA: Diagnosis not present

## 2018-10-09 DIAGNOSIS — O10019 Pre-existing essential hypertension complicating pregnancy, unspecified trimester: Secondary | ICD-10-CM | POA: Diagnosis present

## 2018-10-09 DIAGNOSIS — F17210 Nicotine dependence, cigarettes, uncomplicated: Secondary | ICD-10-CM | POA: Diagnosis present

## 2018-10-09 DIAGNOSIS — O1002 Pre-existing essential hypertension complicating childbirth: Secondary | ICD-10-CM | POA: Diagnosis present

## 2018-10-09 DIAGNOSIS — O34219 Maternal care for unspecified type scar from previous cesarean delivery: Secondary | ICD-10-CM

## 2018-10-09 DIAGNOSIS — O0990 Supervision of high risk pregnancy, unspecified, unspecified trimester: Secondary | ICD-10-CM

## 2018-10-09 DIAGNOSIS — O99214 Obesity complicating childbirth: Secondary | ICD-10-CM | POA: Diagnosis present

## 2018-10-09 DIAGNOSIS — O99824 Streptococcus B carrier state complicating childbirth: Secondary | ICD-10-CM | POA: Diagnosis present

## 2018-10-09 DIAGNOSIS — O99334 Smoking (tobacco) complicating childbirth: Secondary | ICD-10-CM | POA: Diagnosis present

## 2018-10-09 DIAGNOSIS — Z3A39 39 weeks gestation of pregnancy: Secondary | ICD-10-CM

## 2018-10-09 DIAGNOSIS — Z8679 Personal history of other diseases of the circulatory system: Secondary | ICD-10-CM

## 2018-10-09 DIAGNOSIS — O99820 Streptococcus B carrier state complicating pregnancy: Secondary | ICD-10-CM | POA: Diagnosis not present

## 2018-10-09 DIAGNOSIS — Z6841 Body Mass Index (BMI) 40.0 and over, adult: Secondary | ICD-10-CM

## 2018-10-09 DIAGNOSIS — O99210 Obesity complicating pregnancy, unspecified trimester: Secondary | ICD-10-CM | POA: Diagnosis present

## 2018-10-09 DIAGNOSIS — Z98891 History of uterine scar from previous surgery: Secondary | ICD-10-CM

## 2018-10-09 LAB — COMPREHENSIVE METABOLIC PANEL
ALT: 15 U/L (ref 0–44)
AST: 14 U/L — ABNORMAL LOW (ref 15–41)
Albumin: 3.1 g/dL — ABNORMAL LOW (ref 3.5–5.0)
Alkaline Phosphatase: 100 U/L (ref 38–126)
Anion gap: 9 (ref 5–15)
BUN: 11 mg/dL (ref 6–20)
CO2: 20 mmol/L — ABNORMAL LOW (ref 22–32)
Calcium: 9.3 mg/dL (ref 8.9–10.3)
Chloride: 106 mmol/L (ref 98–111)
Creatinine, Ser: 0.5 mg/dL (ref 0.44–1.00)
GFR calc Af Amer: >60 mL/min (ref >60)
GFR calc non Af Amer: >60 mL/min (ref >60)
Glucose, Bld: 93 mg/dL (ref 70–99)
Potassium: 3.6 mmol/L (ref 3.5–5.1)
Sodium: 135 mmol/L (ref 135–145)
Total Bilirubin: 0.1 mg/dL — ABNORMAL LOW (ref 0.3–1.2)
Total Protein: 7.1 g/dL (ref 6.5–8.1)

## 2018-10-09 LAB — CBC
HCT: 34 % — ABNORMAL LOW (ref 36.0–46.0)
Hemoglobin: 10.7 g/dL — ABNORMAL LOW (ref 12.0–15.0)
MCH: 24.7 pg — ABNORMAL LOW (ref 26.0–34.0)
MCHC: 31.5 g/dL (ref 30.0–36.0)
MCV: 78.5 fL — ABNORMAL LOW (ref 80.0–100.0)
Platelets: 389 10*3/uL (ref 150–400)
RBC: 4.33 MIL/uL (ref 3.87–5.11)
RDW: 14.5 % (ref 11.5–15.5)
WBC: 14.5 10*3/uL — ABNORMAL HIGH (ref 4.0–10.5)
nRBC: 0 % (ref 0.0–0.2)

## 2018-10-09 LAB — PROTEIN / CREATININE RATIO, URINE
Creatinine, Urine: 254 mg/dL
Protein Creatinine Ratio: 0.14 mg/mg{Cre} (ref 0.00–0.15)
Total Protein, Urine: 36 mg/dL

## 2018-10-09 LAB — TYPE AND SCREEN
ABO/RH(D): O POS
Antibody Screen: NEGATIVE

## 2018-10-09 SURGERY — Surgical Case
Anesthesia: Spinal

## 2018-10-09 MED ORDER — DIBUCAINE (PERIANAL) 1 % EX OINT: 1.0000 "application " | TOPICAL_OINTMENT | CUTANEOUS | Status: DC | PRN

## 2018-10-09 MED ORDER — LACTATED RINGERS IV SOLN: INTRAVENOUS | Status: DC

## 2018-10-09 MED ORDER — KETOROLAC TROMETHAMINE 30 MG/ML IJ SOLN
15.0000 mg | Freq: Four times a day (QID) | INTRAMUSCULAR | Status: DC
  Administered 2018-10-09 (×2): 15 mg via INTRAVENOUS
  Filled 2018-10-09 (×3): qty 1

## 2018-10-09 MED ORDER — DEXTROSE 5 % IV SOLN
3.0000 g | INTRAVENOUS | Status: AC
  Administered 2018-10-09: 3 g via INTRAVENOUS
  Filled 2018-10-09: qty 3

## 2018-10-09 MED ORDER — NALBUPHINE HCL 10 MG/ML IJ SOLN
5.0000 mg | INTRAMUSCULAR | Status: DC | PRN
  Filled 2018-10-09: qty 1

## 2018-10-09 MED ORDER — SODIUM CHLORIDE 0.9 % IV SOLN
INTRAVENOUS | Status: DC | PRN
  Administered 2018-10-09: 50 ug/min via INTRAVENOUS

## 2018-10-09 MED ORDER — SIMETHICONE 80 MG PO CHEW
80.0000 mg | CHEWABLE_TABLET | ORAL | Status: DC
  Administered 2018-10-09 – 2018-10-11 (×2): 80 mg via ORAL
  Filled 2018-10-09 (×2): qty 1

## 2018-10-09 MED ORDER — BUPIVACAINE IN DEXTROSE 0.75-8.25 % IT SOLN
INTRATHECAL | Status: DC | PRN
  Administered 2018-10-09: 1.6 mL via INTRATHECAL

## 2018-10-09 MED ORDER — SIMETHICONE 80 MG PO CHEW
80.0000 mg | CHEWABLE_TABLET | Freq: Three times a day (TID) | ORAL | Status: DC
  Administered 2018-10-09 – 2018-10-11 (×6): 80 mg via ORAL
  Filled 2018-10-09 (×6): qty 1

## 2018-10-09 MED ORDER — MORPHINE SULFATE (PF) 0.5 MG/ML IJ SOLN
INTRAMUSCULAR | Status: DC | PRN
  Administered 2018-10-09: .1 mg via INTRATHECAL

## 2018-10-09 MED ORDER — ENOXAPARIN SODIUM 40 MG/0.4ML ~~LOC~~ SOLN: 40.0000 mg | SUBCUTANEOUS | Status: DC

## 2018-10-09 MED ORDER — DIPHENHYDRAMINE HCL 25 MG PO CAPS: 25.0000 mg | ORAL_CAPSULE | Freq: Four times a day (QID) | ORAL | Status: DC | PRN

## 2018-10-09 MED ORDER — LACTATED RINGERS IV SOLN
INTRAVENOUS | Status: DC | PRN
  Administered 2018-10-09: 07:00:00 via INTRAVENOUS

## 2018-10-09 MED ORDER — VARICELLA VIRUS VACCINE LIVE 1350 PFU/0.5ML IJ SUSR
0.5000 mL | INTRAMUSCULAR | Status: AC | PRN
  Administered 2018-10-11: 0.5 mL via SUBCUTANEOUS
  Filled 2018-10-09: qty 0.5

## 2018-10-09 MED ORDER — BUPIVACAINE 0.25 % ON-Q PUMP DUAL CATH 400 ML
400.0000 mL | INJECTION | Status: DC
  Filled 2018-10-09: qty 400

## 2018-10-09 MED ORDER — FENTANYL CITRATE (PF) 100 MCG/2ML IJ SOLN
INTRAMUSCULAR | Status: DC | PRN
  Administered 2018-10-09: 15 ug via INTRATHECAL

## 2018-10-09 MED ORDER — IBUPROFEN 800 MG PO TABS
800.0000 mg | ORAL_TABLET | Freq: Three times a day (TID) | ORAL | Status: DC
  Administered 2018-10-09 – 2018-10-11 (×6): 800 mg via ORAL
  Filled 2018-10-09 (×6): qty 1

## 2018-10-09 MED ORDER — ONDANSETRON HCL 4 MG/2ML IJ SOLN
INTRAMUSCULAR | Status: DC | PRN
  Administered 2018-10-09: 4 mg via INTRAVENOUS

## 2018-10-09 MED ORDER — PRENATAL MULTIVITAMIN CH
1.0000 | ORAL_TABLET | Freq: Every day | ORAL | Status: DC
  Administered 2018-10-10 – 2018-10-11 (×2): 1 via ORAL
  Filled 2018-10-09 (×2): qty 1

## 2018-10-09 MED ORDER — FLEET ENEMA 7-19 GM/118ML RE ENEM: 1.0000 | ENEMA | Freq: Every day | RECTAL | Status: DC | PRN

## 2018-10-09 MED ORDER — OXYTOCIN 40 UNITS IN NORMAL SALINE INFUSION - SIMPLE MED
INTRAVENOUS | Status: AC
  Filled 2018-10-09: qty 1000

## 2018-10-09 MED ORDER — DIPHENHYDRAMINE HCL 50 MG/ML IJ SOLN
12.5000 mg | INTRAMUSCULAR | Status: DC | PRN
  Administered 2018-10-09: 12.5 mg via INTRAVENOUS
  Filled 2018-10-09: qty 1

## 2018-10-09 MED ORDER — SENNOSIDES-DOCUSATE SODIUM 8.6-50 MG PO TABS
2.0000 | ORAL_TABLET | ORAL | Status: DC
  Administered 2018-10-09 – 2018-10-11 (×2): 2 via ORAL
  Filled 2018-10-09 (×2): qty 2

## 2018-10-09 MED ORDER — NALBUPHINE HCL 10 MG/ML IJ SOLN
5.0000 mg | Freq: Once | INTRAMUSCULAR | Status: AC | PRN
  Administered 2018-10-09: 5 mg via INTRAVENOUS

## 2018-10-09 MED ORDER — ACETAMINOPHEN 325 MG PO TABS: 650.0000 mg | ORAL_TABLET | Freq: Four times a day (QID) | ORAL | Status: DC | PRN

## 2018-10-09 MED ORDER — NALBUPHINE HCL 10 MG/ML IJ SOLN: 5.0000 mg | INTRAMUSCULAR | Status: DC | PRN

## 2018-10-09 MED ORDER — NALBUPHINE HCL 10 MG/ML IJ SOLN: 5.0000 mg | Freq: Once | INTRAMUSCULAR | Status: AC | PRN

## 2018-10-09 MED ORDER — SIMETHICONE 80 MG PO CHEW: 80.0000 mg | CHEWABLE_TABLET | ORAL | Status: DC | PRN

## 2018-10-09 MED ORDER — BUPIVACAINE HCL (PF) 0.5 % IJ SOLN
INTRAMUSCULAR | Status: AC
  Filled 2018-10-09: qty 30

## 2018-10-09 MED ORDER — OXYTOCIN 40 UNITS IN NORMAL SALINE INFUSION - SIMPLE MED
INTRAVENOUS | Status: DC | PRN
  Administered 2018-10-09: 600 mL via INTRAVENOUS

## 2018-10-09 MED ORDER — OXYCODONE-ACETAMINOPHEN 5-325 MG PO TABS
1.0000 | ORAL_TABLET | ORAL | Status: DC | PRN
  Administered 2018-10-10 – 2018-10-11 (×5): 2 via ORAL
  Filled 2018-10-09 (×6): qty 2

## 2018-10-09 MED ORDER — ONDANSETRON HCL 4 MG/2ML IJ SOLN
INTRAMUSCULAR | Status: AC
  Filled 2018-10-09: qty 2

## 2018-10-09 MED ORDER — BUPIVACAINE HCL (PF) 0.5 % IJ SOLN
INTRAMUSCULAR | Status: DC | PRN
  Administered 2018-10-09: 10 mL

## 2018-10-09 MED ORDER — BISACODYL 10 MG RE SUPP: 10.0000 mg | Freq: Every day | RECTAL | Status: DC | PRN

## 2018-10-09 MED ORDER — DIPHENHYDRAMINE HCL 25 MG PO CAPS
25.0000 mg | ORAL_CAPSULE | ORAL | Status: DC | PRN
  Administered 2018-10-09: 25 mg via ORAL
  Filled 2018-10-09: qty 1

## 2018-10-09 MED ORDER — MORPHINE SULFATE (PF) 2 MG/ML IV SOLN: 1.0000 mg | INTRAVENOUS | Status: DC | PRN

## 2018-10-09 MED ORDER — MENTHOL 3 MG MT LOZG
1.0000 | LOZENGE | OROMUCOSAL | Status: DC | PRN
  Filled 2018-10-09: qty 9

## 2018-10-09 MED ORDER — SODIUM CHLORIDE 0.9% FLUSH: 3.0000 mL | INTRAVENOUS | Status: DC | PRN

## 2018-10-09 MED ORDER — ONDANSETRON HCL 4 MG/2ML IJ SOLN: 4.0000 mg | Freq: Three times a day (TID) | INTRAMUSCULAR | Status: DC | PRN

## 2018-10-09 MED ORDER — NALOXONE HCL 0.4 MG/ML IJ SOLN: 0.4000 mg | INTRAMUSCULAR | Status: DC | PRN

## 2018-10-09 MED ORDER — OXYTOCIN 40 UNITS IN NORMAL SALINE INFUSION - SIMPLE MED
2.5000 [IU]/h | INTRAVENOUS | Status: DC
  Administered 2018-10-09 (×2): 2.5 [IU]/h via INTRAVENOUS
  Filled 2018-10-09: qty 1000

## 2018-10-09 MED ORDER — MORPHINE SULFATE (PF) 0.5 MG/ML IJ SOLN
INTRAMUSCULAR | Status: AC
  Filled 2018-10-09: qty 10

## 2018-10-09 MED ORDER — WITCH HAZEL-GLYCERIN EX PADS: 1.0000 "application " | MEDICATED_PAD | CUTANEOUS | Status: DC | PRN

## 2018-10-09 MED ORDER — MEPERIDINE HCL 50 MG/ML IJ SOLN: 6.2500 mg | INTRAMUSCULAR | Status: DC | PRN

## 2018-10-09 MED ORDER — PHENYLEPHRINE HCL (PRESSORS) 10 MG/ML IV SOLN
INTRAVENOUS | Status: DC | PRN
  Administered 2018-10-09 (×5): 100 ug via INTRAVENOUS

## 2018-10-09 MED ORDER — ACETAMINOPHEN 500 MG PO TABS
1000.0000 mg | ORAL_TABLET | Freq: Four times a day (QID) | ORAL | Status: AC
  Administered 2018-10-09 – 2018-10-10 (×4): 1000 mg via ORAL
  Filled 2018-10-09 (×5): qty 2

## 2018-10-09 MED ORDER — PROPOFOL 10 MG/ML IV BOLUS
INTRAVENOUS | Status: AC
  Filled 2018-10-09: qty 20

## 2018-10-09 MED ORDER — SCOPOLAMINE 1 MG/3DAYS TD PT72: 1.0000 | MEDICATED_PATCH | Freq: Once | TRANSDERMAL | Status: DC

## 2018-10-09 MED ORDER — NALOXONE HCL 4 MG/10ML IJ SOLN
1.0000 ug/kg/h | INTRAVENOUS | Status: DC | PRN
  Filled 2018-10-09: qty 5

## 2018-10-09 MED ORDER — ZOLPIDEM TARTRATE 5 MG PO TABS: 5.0000 mg | ORAL_TABLET | Freq: Every evening | ORAL | Status: DC | PRN

## 2018-10-09 MED ORDER — FERROUS SULFATE 325 (65 FE) MG PO TABS
325.0000 mg | ORAL_TABLET | Freq: Two times a day (BID) | ORAL | Status: DC
  Administered 2018-10-09 – 2018-10-11 (×4): 325 mg via ORAL
  Filled 2018-10-09 (×4): qty 1

## 2018-10-09 MED ORDER — SOD CITRATE-CITRIC ACID 500-334 MG/5ML PO SOLN
30.0000 mL | ORAL | Status: AC
  Administered 2018-10-09: 08:00:00 30 mL via ORAL
  Filled 2018-10-09: qty 30

## 2018-10-09 MED ORDER — FENTANYL CITRATE (PF) 100 MCG/2ML IJ SOLN
INTRAMUSCULAR | Status: AC
  Filled 2018-10-09: qty 2

## 2018-10-09 MED ORDER — SODIUM CHLORIDE 0.9 % IV BOLUS
1000.0000 mL | Freq: Once | INTRAVENOUS | Status: AC
  Administered 2018-10-09: 1000 mL via INTRAVENOUS

## 2018-10-09 MED ORDER — COCONUT OIL OIL: 1.0000 "application " | TOPICAL_OIL | Status: DC | PRN

## 2018-10-09 MED ORDER — LABETALOL HCL 100 MG PO TABS
100.0000 mg | ORAL_TABLET | Freq: Two times a day (BID) | ORAL | Status: DC
  Administered 2018-10-09 – 2018-10-11 (×4): 100 mg via ORAL
  Filled 2018-10-09 (×4): qty 1

## 2018-10-09 SURGICAL SUPPLY — 25 items
CANISTER SUCT 3000ML PPV (MISCELLANEOUS) ×3 IMPLANT
CHLORAPREP W/TINT 26 (MISCELLANEOUS) ×6 IMPLANT
DERMABOND ADVANCED (GAUZE/BANDAGES/DRESSINGS) ×2
DERMABOND ADVANCED .7 DNX12 (GAUZE/BANDAGES/DRESSINGS) ×1 IMPLANT
DRSG OPSITE POSTOP 4X10 (GAUZE/BANDAGES/DRESSINGS) ×3 IMPLANT
ELECT CAUTERY BLADE 6.4 (BLADE) ×3 IMPLANT
ELECT REM PT RETURN 9FT ADLT (ELECTROSURGICAL) ×3
ELECTRODE REM PT RTRN 9FT ADLT (ELECTROSURGICAL) ×1 IMPLANT
GLOVE BIOGEL PI IND STRL 6.5 (GLOVE) ×2 IMPLANT
GLOVE BIOGEL PI INDICATOR 6.5 (GLOVE) ×4
GOWN STRL REUS W/ TWL LRG LVL3 (GOWN DISPOSABLE) ×1 IMPLANT
GOWN STRL REUS W/ TWL XL LVL3 (GOWN DISPOSABLE) ×2 IMPLANT
GOWN STRL REUS W/TWL LRG LVL3 (GOWN DISPOSABLE) ×2
GOWN STRL REUS W/TWL XL LVL3 (GOWN DISPOSABLE) ×4
KIT PREVENA INCISION MGT 13 (CANNISTER) ×2 IMPLANT
NS IRRIG 1000ML POUR BTL (IV SOLUTION) ×3 IMPLANT
PACK C SECTION AR (MISCELLANEOUS) ×3 IMPLANT
PAD OB MATERNITY 4.3X12.25 (PERSONAL CARE ITEMS) ×3 IMPLANT
PAD PREP 24X41 OB/GYN DISP (PERSONAL CARE ITEMS) ×3 IMPLANT
PENCIL SMOKE ULTRAEVAC 22 CON (MISCELLANEOUS) ×3 IMPLANT
SUT MNCRL AB 4-0 PS2 18 (SUTURE) ×3 IMPLANT
SUT PLAIN 3-0 (SUTURE) ×3 IMPLANT
SUT VIC AB 0 CT1 36 (SUTURE) ×9 IMPLANT
SUT VIC AB 2-0 CT1 36 (SUTURE) ×3 IMPLANT
SYR 30ML LL (SYRINGE) ×6 IMPLANT

## 2018-10-09 NOTE — Anesthesia Preprocedure Evaluation (Addendum)
Anesthesia Evaluation  Patient identified by MRN, date of birth, ID band Patient awake    Reviewed: Allergy & Precautions, H&P , NPO status , Patient's Chart, lab work & pertinent test results  Airway Mallampati: II  TM Distance: >3 FB Neck ROM: full    Dental  (+) Teeth Intact   Pulmonary neg recent URI, Current Smoker,           Cardiovascular Exercise Tolerance: Good hypertension, (-) angina  ASD   Neuro/Psych PSYCHIATRIC DISORDERS Anxiety    GI/Hepatic negative GI ROS,   Endo/Other  Morbid obesity  Renal/GU   negative genitourinary   Musculoskeletal   Abdominal   Peds  Hematology negative hematology ROS (+)   Anesthesia Other Findings Past Medical History: No date: Anxiety No date: Atrial septal defect No date: Heart defect     Comment:  ASD-POSSIBLE PFO No date: Hypertension     Comment:  ONLY DURING PREGNANCY No date: Shortness of breath dyspnea  Past Surgical History: 10/28/2008: CESAREAN SECTION 02/22/2015: CESAREAN SECTION; N/A     Comment:  Procedure: CESAREAN SECTION;  Surgeon: Gae Dry,               MD;  Location: [facility] ORS;  Service: Obstetrics;                Laterality: N/A; 04/13/2010: CESAREAN SECTION  BMI    Body Mass Index: 47.46 kg/m      Reproductive/Obstetrics (+) Pregnancy                            Anesthesia Physical Anesthesia Plan  ASA: III  Anesthesia Plan: Spinal   Post-op Pain Management:    Induction:   PONV Risk Score and Plan:   Airway Management Planned:   Additional Equipment:   Intra-op Plan:   Post-operative Plan:   Informed Consent: I have reviewed the patients History and Physical, chart, labs and discussed the procedure including the risks, benefits and alternatives for the proposed anesthesia with the patient or authorized representative who has indicated his/her understanding and acceptance.     Dental  Advisory Given  Plan Discussed with: Anesthesiologist  Anesthesia Plan Comments:         Anesthesia Quick Evaluation

## 2018-10-09 NOTE — Plan of Care (Signed)
  Problem: Education: Goal: Knowledge of Childbirth will improve Outcome: Completed/Met Goal: Ability to make informed decisions regarding treatment and plan of care will improve Outcome: Completed/Met Goal: Ability to state and carry out methods to decrease the pain will improve Outcome: Completed/Met Goal: Individualized Educational Video(s) Outcome: Completed/Met   Problem: Coping: Goal: Ability to verbalize concerns and feelings about labor and delivery will improve Outcome: Completed/Met   Problem: Life Cycle: Goal: Ability to make normal progression through stages of labor will improve Outcome: Completed/Met Goal: Ability to effectively push during vaginal delivery will improve Outcome: Completed/Met   Problem: Role Relationship: Goal: Will demonstrate positive interactions with the child Outcome: Completed/Met   Problem: Pain Management: Goal: Relief or control of pain from uterine contractions will improve Outcome: Completed/Met   Problem: Education: Goal: Knowledge of General Education information will improve Description: Including pain rating scale, medication(s)/side effects and non-pharmacologic comfort measures Outcome: Progressing

## 2018-10-09 NOTE — Progress Notes (Signed)
Patient decided in the minutes before her cesarean section that she no longer wanted to have a tubal ligation. Will not perform sterilization procedure. She would like to have a tubal ligation instead.   Adrian Prows MD [facility], [facility] 10/09/2018 7:39 AM

## 2018-10-09 NOTE — Interval H&P Note (Signed)
History and Physical Interval Note:  10/09/2018 7:30 AM  Ashley Howell  has presented today for surgery, with the diagnosis of REPEAT CESAREAN SECTION.  The various methods of treatment have been discussed with the patient and family. After consideration of risks, benefits and other options for treatment, the patient has consented to  Procedure(s): Walker (N/A) as a surgical intervention.  The patient's history has been reviewed, patient examined, no change in status, stable for surgery.  I have reviewed the patient's chart and labs.  Questions were answered to the patient's satisfaction.     [place]

## 2018-10-09 NOTE — Discharge Summary (Signed)
OB Discharge Summary     Patient Name: Ashley Howell DOB: 07/11/1990 MRN: [id number]  Date of admission: 10/09/2018 Delivering MD: Natale Milchhristanna R Schuman, MD  Date of Delivery: 10/09/2018  Date of discharge: 10/11/2018  Admitting diagnosis: REPEAT CESAREAN SECTION Intrauterine pregnancy: [id number]     Secondary diagnosis: Chronic Hypertension     Discharge diagnosis: Term Pregnancy Delivered, chronic hypertension affecting pregnancy, Morbind obesity affecting pregnancy                         Hospital course:  Sceduled C/S   28 y.o. yo [id number] at 2692w1d was admitted to the hospital 10/09/2018 for scheduled cesarean section with the following indication:Elective Repeat.  Membrane Rupture Time/Date: 8:34 AM ,10/09/2018   Patient delivered a Viable infant.10/09/2018  Details of operation can be found in separate operative note.  Pateint had an uncomplicated postpartum course.  She is ambulating, tolerating a regular diet, passing flatus, and urinating well. Patient is discharged home in stable condition on  10/11/18                                                                        Post partum procedures:none  Complications: None  Physical exam on 10/11/2018: Vitals:   10/10/18 1607 10/10/18 1932 10/10/18 2337 10/11/18 0745  BP: 127/67 (!) 114/59 135/85 135/72  Pulse: 88 79 99 88  Resp: 18 16 16 20   Temp: 98 F (36.7 C) 98 F (36.7 C) 99 F (37.2 C) 98.5 F (36.9 C)  TempSrc: Oral Oral Oral Oral  SpO2: 99% 98% 99% 100%  Weight:      Height:       General: alert, cooperative and no distress Lochia: appropriate Uterine Fundus: firm Incision: Healing well with no significant drainage, Dressing is clean, dry, and intact DVT Evaluation: No evidence of DVT seen on physical exam. No cords or calf tenderness. No significant calf/ankle edema.  Labs: Lab Results  Component Value Date   WBC 10.8 (H) 10/10/2018   HGB 10.4 (L) 10/10/2018   HCT 33.2 (L) 10/10/2018   MCV 80.0 10/10/2018    PLT 359 10/10/2018   CMP Latest Ref Rng & Units 10/09/2018  Glucose 70 - 99 mg/dL 93  BUN 6 - 20 mg/dL 11  Creatinine 1.470.44 - 8.291.00 mg/dL 5.620.50  Sodium 130135 - 865145 mmol/L 135  Potassium 3.5 - 5.1 mmol/L 3.6  Chloride 98 - 111 mmol/L 106  CO2 22 - 32 mmol/L 20(L)  Calcium 8.9 - 10.3 mg/dL 9.3  Total Protein 6.5 - 8.1 g/dL 7.1  Total Bilirubin 0.3 - 1.2 mg/dL 7.8(I0.1(L)  Alkaline Phos 38 - 126 U/L 100  AST 15 - 41 U/L 14(L)  ALT 0 - 44 U/L 15    Discharge instruction: per After Visit Summary.  Medications:  Allergies as of 10/11/2018   No Known Allergies     Medication List    TAKE these medications   aspirin 81 MG chewable tablet Chew 81 mg by mouth daily.   enoxaparin 60 MG/0.6ML injection Commonly known as: LOVENOX Inject 0.6 mLs (60 mg total) into the skin daily. Start taking on: October 12, 2018   ibuprofen 800 MG tablet Commonly known as: ADVIL Take  1 tablet (800 mg total) by mouth every 8 (eight) hours.   labetalol 100 MG tablet Commonly known as: NORMODYNE Take 1 tablet (100 mg total) by mouth 2 (two) times daily.   oxyCODONE 5 MG immediate release tablet Commonly known as: Oxy IR/ROXICODONE Take 1 tablet (5 mg total) by mouth every 6 (six) hours as needed for severe pain.   prenatal multivitamin Tabs tablet Take 1 tablet by mouth daily at 12 noon.            Discharge Care Instructions  (From admission, onward)         Start     Ordered   10/11/18 0000  Discharge wound care:    Comments: Perform wound care instructions   10/11/18 1103          Diet: routine diet  Activity: Advance as tolerated. Pelvic rest for 6 weeks.   Outpatient follow up: Follow-up Information    Schuman, Stefanie Libel, MD. Call in 2 day(s).   Specialty: Obstetrics and Gynecology Why: Call to make an appointment for about 1 week after your surgery. Contact information: [address] [place] [address] [phone]             Postpartum contraception:  Nexplanon Rhogam Given postpartum: no Rubella vaccine given postpartum: no Varicella vaccine given postpartum: yes   TDaP given antepartum or postpartum: Yes, 09/02/2018  Newborn Data: Live born female  Birth Weight: 6 lb 1.4 oz (2,760 g) APGAR: 9, 9  Newborn Delivery   Birth date/time: 10/09/2018 08:35:00 Delivery type: C-Section, Low Transverse Trial of labor: No C-section categorization: Repeat       Baby Feeding: breast and formula  Disposition:home with mother  SIGNED: Prentice Docker, MD, [place] 10/11/2018 11:06 AM

## 2018-10-09 NOTE — Anesthesia Procedure Notes (Signed)
Procedure Name: MAC Performed by: Supriya Beaston, CRNA Pre-anesthesia Checklist: Patient identified, Emergency Drugs available, Suction available, Patient being monitored and Timeout performed Oxygen Delivery Method: Simple face mask       

## 2018-10-09 NOTE — Plan of Care (Signed)
  Problem: Education: Goal: Knowledge of Childbirth will improve Outcome: Completed/Met Goal: Ability to make informed decisions regarding treatment and plan of care will improve Outcome: Completed/Met Goal: Ability to state and carry out methods to decrease the pain will improve Outcome: Completed/Met Goal: Individualized Educational Video(s) Outcome: Completed/Met   Problem: Coping: Goal: Ability to verbalize concerns and feelings about labor and delivery will improve Outcome: Completed/Met   Problem: Life Cycle: Goal: Ability to make normal progression through stages of labor will improve Outcome: Completed/Met Goal: Ability to effectively push during vaginal delivery will improve Outcome: Completed/Met   Problem: Safety: Goal: Risk of complications during labor and delivery will decrease Outcome: Completed/Met   Problem: Role Relationship: Goal: Will demonstrate positive interactions with the child Outcome: Completed/Met   Problem: Pain Management: Goal: Relief or control of pain from uterine contractions will improve Outcome: Completed/Met   Problem: Life Cycle: Goal: Ability to make normal progression through stages of labor will improve Outcome: Completed/Met Goal: Ability to effectively push during vaginal delivery will improve Outcome: Completed/Met

## 2018-10-09 NOTE — Transfer of Care (Signed)
Immediate Anesthesia Transfer of Care Note  Patient: Ashley Howell  Procedure(s) Performed: CESAREAN SECTION (N/A )  Patient Location: PACU  Anesthesia Type:Spinal  Level of Consciousness: awake, alert  and oriented  Airway & Oxygen Therapy: Patient Spontanous Breathing  Post-op Assessment: Report given to RN and Post -op Vital signs reviewed and stable  Post vital signs: Reviewed and stable  Last Vitals:  Vitals Value Taken Time  BP    Temp    Pulse    Resp    SpO2      Last Pain:  Vitals:   10/09/18 0710  TempSrc: Oral  PainSc: 0-No pain         Complications: No apparent anesthesia complications

## 2018-10-09 NOTE — Lactation Note (Signed)
This note was copied from a baby's chart. Lactation Consultation Note  Patient Name: Ashley Howell [id number] Date: 10/09/2018     Maternal Data    Feeding Feeding Type: Formula Nipple Type: Slow - flow  LATCH Score                   Interventions    Lactation Tools Discussed/Used Tools: Bottle   Consult Status   Lactation talked with mother about plans for feeding infant. Mother states that she plans to breast and bottle feed. Mother states that she gave infant a bottle of formula because she was tired and plans to breastfeed infant at the next feeding.   Elvera Lennox 06/08/7612, 1:50 PM

## 2018-10-09 NOTE — Anesthesia Post-op Follow-up Note (Signed)
Anesthesia QCDR form completed.        

## 2018-10-09 NOTE — OB Triage Note (Signed)
Pt here for scheduled cesarean section. Pt has no complaints and is feeling baby move well.

## 2018-10-09 NOTE — Op Note (Signed)
Cesarean Section Procedure Note 10/09/18  Pre-operative Diagnosis:  1. Hx of prior Low transverse cesarean section x3  2. [redacted] week gestation Post-operative Diagnosis: same, delivered. Procedure: Repeat Low Transverse Cesarean Section   Surgeon: Adrian Prows MD   Assistant(s): Malachy Mood MD - No other skilled surgical assistant available. Anesthesia: Spinal Estimated Blood Loss: 409WJ Complications: None; patient tolerated the procedure well.   Disposition: PACU - hemodynamically stable. Condition: stable   Findings: A female infant in the cephalic presentation. Amniotic fluid - meconium Birth weight: 2760 grams Apgars of 9 and 9.  Intact placenta with a three-vessel cord. Grossly normal uterus, tubes and ovaries bilaterally. Minimal intraabdominal adhesions were noted.   Procedure Details    The patient was taken to operating room, identified as the correct patient and the procedure verified as C-Section Delivery. A time out was held and the above information confirmed. After induction of anesthesia, the patient was draped and prepped in the usual sterile manner. A Pfannenstiel incision was made and carried down through the subcutaneous tissue to the fascia. Fascial incision was made and extended transversely with the Mayo scissors. The fascia was separated from the underlying rectus tissue superiorly and inferiorly. The peritoneum was identified and entered bluntly. Peritoneal incision was extended longitudinally. A low transverse hysterotomy was made. The fetus was delivered atraumatically. The umbilical cord was clamped x2 and cut and the infant was handed to the awaiting pediatricians. The placenta was removed intact and appeared normal with a 3-vessel cord. The uterus was exteriorized and cleared of all clot and debris. The hysterotomy was closed with running sutures of 0 Vicryl suture. A second imbricating layer was placed with the same suture. Excellent hemostasis was  observed.   The uterus was returned to the abdomen. The pelvis was irrigated and again, excellent hemostasis was noted. The peritoneum was closed with a running stitch of 2-0 Vicryl. The On Q Pain pump System was then placed.  Trocars were placed through the abdominal wall into the subfascial space and these were used to thread the silver soaker cathaters into place.The rectus muscles were inspected and were hemostatic. The rectus fascia was then reapproximated with running sutures of  looped PDS , with careful placement not to incorporate the cathaters. Subcutaneous tissues are then irrigated with saline and hemostasis assured with the bovie. The subcutaneous fat was approximated with 3-0 plain and a running stitch.  The skin was closed with 4-0 monocryl suture in a subcuticular fashion followed by steri strips. A Provena wound vac was placed.  The cathaters are flushed each with 5 mL of Bupivicaine and stabilized into place with dressing. Instrument, sponge, and needle counts were correct prior to the abdominal closure and at the conclusion of the case.  The patient tolerated the procedure well and was transferred to the recovery room in stable condition.   Homero Fellers MD [facility], [facility] 10/09/18 9:43 AM

## 2018-10-09 NOTE — Anesthesia Procedure Notes (Signed)
Spinal  Patient location during procedure: OR Start time: 10/09/2018 7:52 AM End time: 10/09/2018 8:03 AM Staffing Anesthesiologist: Durenda Hurt, MD Performed: anesthesiologist  Preanesthetic Checklist Completed: patient identified, site marked, surgical consent, pre-op evaluation, timeout performed, IV checked, risks and benefits discussed and monitors and equipment checked Spinal Block Patient position: sitting Prep: ChloraPrep Patient monitoring: heart rate, continuous pulse ox, blood pressure and cardiac monitor Approach: midline Location: L4-5 Injection technique: single-shot Needle Needle type: Whitacre and Introducer  Needle gauge: 24 G Needle length: 9 cm Additional Notes Negative paresthesia. Positive free-flowing CSF. Patient tolerated procedure well, without complications.

## 2018-10-10 LAB — CBC
HCT: 33.2 % — ABNORMAL LOW (ref 36.0–46.0)
Hemoglobin: 10.4 g/dL — ABNORMAL LOW (ref 12.0–15.0)
MCH: 25.1 pg — ABNORMAL LOW (ref 26.0–34.0)
MCHC: 31.3 g/dL (ref 30.0–36.0)
MCV: 80 fL (ref 80.0–100.0)
Platelets: 359 10*3/uL (ref 150–400)
RBC: 4.15 MIL/uL (ref 3.87–5.11)
RDW: 14.6 % (ref 11.5–15.5)
WBC: 10.8 10*3/uL — ABNORMAL HIGH (ref 4.0–10.5)
nRBC: 0 % (ref 0.0–0.2)

## 2018-10-10 LAB — CHLAMYDIA/NGC RT PCR (ARMC ONLY)
Chlamydia Tr: NOT DETECTED
N gonorrhoeae: NOT DETECTED

## 2018-10-10 MED ORDER — ONDANSETRON HCL 4 MG PO TABS
4.0000 mg | ORAL_TABLET | Freq: Three times a day (TID) | ORAL | Status: DC | PRN
  Administered 2018-10-10: 4 mg via ORAL
  Filled 2018-10-10: qty 1

## 2018-10-10 MED ORDER — ENOXAPARIN SODIUM 40 MG/0.4ML ~~LOC~~ SOLN: 40.0000 mg | Freq: Two times a day (BID) | SUBCUTANEOUS | Status: DC

## 2018-10-10 MED ORDER — ENOXAPARIN SODIUM 60 MG/0.6ML ~~LOC~~ SOLN
60.0000 mg | SUBCUTANEOUS | Status: DC
  Administered 2018-10-10 – 2018-10-11 (×2): 60 mg via SUBCUTANEOUS
  Filled 2018-10-10 (×2): qty 0.6

## 2018-10-10 MED ORDER — OXYCODONE HCL 5 MG PO TABS
5.0000 mg | ORAL_TABLET | ORAL | Status: DC | PRN
  Administered 2018-10-10: 12:00:00 5 mg via ORAL
  Filled 2018-10-10: qty 1

## 2018-10-10 MED ORDER — ENOXAPARIN SODIUM 60 MG/0.6ML ~~LOC~~ SOLN: 60.0000 mg | Freq: Two times a day (BID) | SUBCUTANEOUS | Status: DC

## 2018-10-10 NOTE — Lactation Note (Signed)
This note was copied from a baby's chart. Lactation Consultation Note  Patient Name: Ashley Howell [id number] Date: 10/10/2018 Reason for consult: Follow-up assessment   Maternal Data Formula Feeding for Exclusion: No Does the patient have breastfeeding experience prior to this delivery?: Yes Breastfed her other children x 3 mths, this is first time she has breastfed this baby, states she was too tired yesterday, mom states she latched well to left breast and breastfed for 10 min and  She will try right breast at next feeding, I encouraged her to call for my assistance if needed, [place] and plans on obtaining an electric breast pump from [facility]    Feeding Feeding Type: Breast Fed Nipple Type: Slow - flow  LATCH Score Latch: (did not observe a feeding)                 Interventions    Lactation Tools Discussed/Used WIC Program: Yes Pump Review: Setup, frequency, and cleaning;Milk Storage Initiated by:: Chaya Jan RNC IBCLC Date initiated:: 10/10/18   Consult Status Consult Status: PRN    Ferol Luz 10/10/2018, 2:48 PM

## 2018-10-10 NOTE — Anesthesia Postprocedure Evaluation (Addendum)
Anesthesia Post Note  Patient: Ashley Howell  Procedure(s) Performed: CESAREAN SECTION (N/A )  Patient location during evaluation: Mother Baby Anesthesia Type: Spinal Level of consciousness: awake and alert Pain management: pain level controlled Respiratory status: spontaneous breathing, nonlabored ventilation and respiratory function stable Cardiovascular status: stable Postop Assessment: no apparent nausea or vomiting Anesthetic complications: no     Last Vitals:  Vitals:   10/10/18 1013 10/10/18 1607  BP: 136/87 127/67  Pulse: 95 88  Resp: 18 18  Temp: 36.8 C 36.7 C  SpO2: 99% 99%    Last Pain:  Vitals:   10/10/18 1628  TempSrc:   PainSc: [place]

## 2018-10-10 NOTE — Progress Notes (Signed)
Admit Date: 10/09/2018 Today's Date: 10/10/2018  Subjective: Postpartum Day 1: Cesarean Delivery Patient reports + BM.  She is voiding. Her pain is controlled. She is tolerating a regular diet. She is bottle feeding. Feeling well, no complaints.   Objective: Vital signs in last 24 hours: Temp:  [97.7 F (36.5 C)-98.9 F (37.2 C)] 98.1 F (36.7 C) (07/03 0804) Pulse Rate:  [72-111] 89 (07/03 0804) Resp:  [12-33] 18 (07/03 0804) BP: (104-150)/(57-93) 118/63 (07/03 0804) SpO2:  [95 %-100 %] 99 % (07/03 0804) Weight:  [141 kg] 141 kg (07/03 0339)  Physical Exam:  General: alert and cooperative Lochia: appropriate Uterine Fundus: firm Incision: healing well, no significant drainage, no dehiscence DVT Evaluation: No evidence of DVT seen on physical exam.  Recent Labs    10/09/18 0536 10/10/18 0629  HGB 10.7* 10.4*  HCT 34.0* 33.2*    Assessment/Plan: Status post Cesarean section. Doing well postoperatively.  Continue current care.  28 yo s/p RLTCS POD #1 1. VTE prophylaxis- Start lovenox 60 mg q 24 hours, plan for postpartum anticoagulation for 10 days.  2. Pain controlled with medications 3. Tolerating regular diet 4. Encourage ambulation 5. Desires Nexplanon for birth control. 6. Tdap up to date 7. Rubella Immune, Varivax needed before discahrge    Walta [place] R Nain Rudd 10/10/2018, 8:49 AM

## 2018-10-10 NOTE — Progress Notes (Signed)
PHARMACY NOTE:  ANTICOAGULATION RENAL DOSAGE ADJUSTMENT  Current anticoagulation regimen includes a mismatch between anticoagulation dosage and estimated renal function.  As per policy approved by the Pharmacy & Therapeutics and Medical Executive Committees, the anticoagulation dosage will be adjusted accordingly.  Current anticoagulation dosage:  Enoxaparin 60 mg subcutaneous daily  Indication: Post-op VTE prophylaxis  Renal Function:  Estimated Creatinine Clearance: 155.7 mL/min (by C-G formula based on SCr of 0.5 mg/dL). []      On intermittent HD, scheduled: []      On CRRT    Anticoagulation dosage has been changed to:  Enoxaparin 40 mg subq twice daily per CrCl > 30 ml/min and BMI > 40 kg/m^2  Thank you for allowing pharmacy to be a part of this patient's care.  Tobie Lords, PharmD, BCPS Clinical Pharmacist 10/10/2018 6:21 AM

## 2018-10-11 MED ORDER — OXYCODONE HCL 5 MG PO TABS: 5.0000 mg | ORAL_TABLET | Freq: Four times a day (QID) | ORAL | 0 refills | Status: DC | PRN

## 2018-10-11 MED ORDER — ENOXAPARIN SODIUM 60 MG/0.6ML ~~LOC~~ SOLN: 60.0000 mg | SUBCUTANEOUS | 0 refills | Status: DC

## 2018-10-11 MED ORDER — VARICELLA VIRUS VACCINE LIVE 1350 PFU/0.5ML IJ SUSR: 0.5000 mL | INTRAMUSCULAR | Status: DC | PRN

## 2018-10-11 MED ORDER — IBUPROFEN 800 MG PO TABS: 800.0000 mg | ORAL_TABLET | Freq: Three times a day (TID) | ORAL | 0 refills | Status: DC

## 2018-10-11 NOTE — Progress Notes (Signed)
Discharge instructions given and prescriptions sent to pharmacy. RN instructed patient and significant other on how and when to remove On-Q pump. Patient verbalizes understanding of teaching. Patient discharged home via wheelchair at 1620.

## 2018-10-17 ENCOUNTER — Encounter: Payer: Self-pay | Admitting: Obstetrics and Gynecology

## 2018-10-17 ENCOUNTER — Other Ambulatory Visit: Payer: Self-pay

## 2018-10-17 ENCOUNTER — Ambulatory Visit (INDEPENDENT_AMBULATORY_CARE_PROVIDER_SITE_OTHER): Payer: Medicaid Other | Admitting: Obstetrics and Gynecology

## 2018-10-17 DIAGNOSIS — R51 Headache: Secondary | ICD-10-CM | POA: Insufficient documentation

## 2018-10-17 DIAGNOSIS — Z5321 Procedure and treatment not carried out due to patient leaving prior to being seen by health care provider: Secondary | ICD-10-CM | POA: Insufficient documentation

## 2018-10-17 DIAGNOSIS — O165 Unspecified maternal hypertension, complicating the puerperium: Secondary | ICD-10-CM

## 2018-10-17 LAB — COMPREHENSIVE METABOLIC PANEL
ALT: 23 U/L (ref 0–44)
AST: 18 U/L (ref 15–41)
Albumin: 3.5 g/dL (ref 3.5–5.0)
Alkaline Phosphatase: 86 U/L (ref 38–126)
Anion gap: 10 (ref 5–15)
BUN: 16 mg/dL (ref 6–20)
CO2: 20 mmol/L — ABNORMAL LOW (ref 22–32)
Calcium: 9 mg/dL (ref 8.9–10.3)
Chloride: 109 mmol/L (ref 98–111)
Creatinine, Ser: 0.85 mg/dL (ref 0.44–1.00)
GFR calc Af Amer: >60 mL/min (ref >60)
GFR calc non Af Amer: >60 mL/min (ref >60)
Glucose, Bld: 81 mg/dL (ref 70–99)
Potassium: 3.6 mmol/L (ref 3.5–5.1)
Sodium: 139 mmol/L (ref 135–145)
Total Bilirubin: 0.4 mg/dL (ref 0.3–1.2)
Total Protein: 7 g/dL (ref 6.5–8.1)

## 2018-10-17 LAB — CBC WITH DIFFERENTIAL/PLATELET
Abs Immature Granulocytes: 0.03 10*3/uL (ref 0.00–0.07)
Basophils Absolute: 0 10*3/uL (ref 0.0–0.1)
Basophils Relative: 0 %
Eosinophils Absolute: 0.3 10*3/uL (ref 0.0–0.5)
Eosinophils Relative: 2 %
HCT: 35.9 % — ABNORMAL LOW (ref 36.0–46.0)
Hemoglobin: 11.2 g/dL — ABNORMAL LOW (ref 12.0–15.0)
Immature Granulocytes: 0 %
Lymphocytes Relative: 29 %
Lymphs Abs: 3.2 10*3/uL (ref 0.7–4.0)
MCH: 24.6 pg — ABNORMAL LOW (ref 26.0–34.0)
MCHC: 31.2 g/dL (ref 30.0–36.0)
MCV: 78.7 fL — ABNORMAL LOW (ref 80.0–100.0)
Monocytes Absolute: 0.7 10*3/uL (ref 0.1–1.0)
Monocytes Relative: 6 %
Neutro Abs: 7 10*3/uL (ref 1.7–7.7)
Neutrophils Relative %: 63 %
Platelets: 446 10*3/uL — ABNORMAL HIGH (ref 150–400)
RBC: 4.56 MIL/uL (ref 3.87–5.11)
RDW: 14.7 % (ref 11.5–15.5)
WBC: 11.2 10*3/uL — ABNORMAL HIGH (ref 4.0–10.5)
nRBC: 0 % (ref 0.0–0.2)

## 2018-10-17 MED ORDER — AMLODIPINE BESYLATE 5 MG PO TABS: 5.0000 mg | ORAL_TABLET | Freq: Every day | ORAL | 1 refills | Status: DC

## 2018-10-17 NOTE — ED Triage Notes (Signed)
Patient reports having baby on 7/2. Patient was hypertensive throughout pregnancy. Patient had checkup today and was hypertensive as well. Patient went home, took a nap and woke up with a headache. Patient was tested at [facility] today for pre-eclampsia, negative per patient.

## 2018-10-17 NOTE — ED Notes (Signed)
Triage performed by Eliezer Lofts RN, not Alissa, NT and mistakenly documented under Alissa, NT.

## 2018-10-17 NOTE — Progress Notes (Signed)
  OBSTETRICS POSTPARTUM CLINIC PROGRESS NOTE  Subjective:     Ashley Howell is a 28 y.o. [id number] female who presents for a postpartum visit. She is 1 week postpartum following a Term pregnancy and delivery by C-section repeat; no problems after deliver.  I have fully reviewed the prenatal and intrapartum course. Anesthesia: spinal.  Postpartum course has been complicated by uncomplicated.  Baby is feeding by Bottle.  Bleeding: patient has not  resumed menses.  Bowel function is normal. Bladder function is normal.  Patient is not sexually active Postpartum depression screening: negative.   She does report a headache which she contributes to very little sleep.   The following portions of the patient's history were reviewed and updated as appropriate: allergies, current medications, past family history, past medical history, past social history, past surgical history and problem list.  Review of Systems Pertinent items are noted in HPI.  Objective:    BP (!) 162/100   Pulse 97   Ht 5\' 7"  (1.702 m)   Wt (!) 305 lb (138.3 kg)   Breastfeeding Yes   BMI 47.77 kg/m   General:  alert and no distress   Breasts:  inspection negative, no nipple discharge or bleeding, no masses or nodularity palpable  Lungs: clear to auscultation bilaterally  Heart:  regular rate and rhythm, S1, S2 normal, no murmur, click, rub or gallop  Abdomen: soft, non-tender; bowel sounds normal; no masses,  no organomegaly.   Well healed Pfannenstiel incision. Vacuum device removed, incision cleaned, skin glue applied.    Vulva:  normal  Vagina: normal vagina, no discharge, exudate, lesion, or erythema  Cervix:  no cervical motion tenderness and no lesions  Corpus: normal size, contour, position, consistency, mobility, non-tender  Adnexa:  normal adnexa and no mass, fullness, tenderness  Rectal Exam: Not performed.          Assessment:  Post Partum Care visit 1. Postpartum hypertension Discussed warning  signs of preeclampsia. Patient will rest at home and take a nap. If her headache does npt resolve she will go to the ER. Stat labs performed, results WNL.   - Protein / creatinine ratio, urine - amLODipine (NORVASC) 5 MG tablet; Take 1 tablet (5 mg total) by mouth daily.  Dispense: 30 tablet; Refill: 1 - CBC - Comprehensive metabolic panel   Plan:    Significant other Ashley Howell [phone]  Stat pre-e labs- WNL  Called and discussed with patient.  Norvasc 5mg  added.  Will return on Monday for BP check.  Will go to the hospital over the weekend if headache or other warning signs of Preeclampsia occur.   Adrian Prows MD [facility] 10/17/2018 3:58 PM

## 2018-10-18 ENCOUNTER — Emergency Department
Admission: EM | Admit: 2018-10-18 | Discharge: 2018-10-18 | Disposition: A | Discharge status: Home / Self Care | Payer: Medicaid Other | Attending: Emergency Medicine | Admitting: Emergency Medicine

## 2018-10-18 ENCOUNTER — Encounter: Payer: Self-pay | Admitting: Emergency Medicine

## 2018-10-18 ENCOUNTER — Other Ambulatory Visit: Payer: Self-pay

## 2018-10-18 DIAGNOSIS — R5383 Other fatigue: Secondary | ICD-10-CM | POA: Insufficient documentation

## 2018-10-18 DIAGNOSIS — Z7982 Long term (current) use of aspirin: Secondary | ICD-10-CM | POA: Insufficient documentation

## 2018-10-18 DIAGNOSIS — Z79899 Other long term (current) drug therapy: Secondary | ICD-10-CM | POA: Insufficient documentation

## 2018-10-18 DIAGNOSIS — F17210 Nicotine dependence, cigarettes, uncomplicated: Secondary | ICD-10-CM | POA: Insufficient documentation

## 2018-10-18 DIAGNOSIS — R51 Headache: Secondary | ICD-10-CM | POA: Insufficient documentation

## 2018-10-18 LAB — COMPREHENSIVE METABOLIC PANEL
ALT: 22 IU/L (ref 0–32)
AST: 19 IU/L (ref 0–40)
Albumin/Globulin Ratio: 1.4 (ref 1.2–2.2)
Albumin: 3.8 g/dL — ABNORMAL LOW (ref 3.9–5.0)
Alkaline Phosphatase: 95 IU/L (ref 39–117)
BUN/Creatinine Ratio: 16 (ref 9–23)
BUN: 14 mg/dL (ref 6–20)
Bilirubin Total: 0.3 mg/dL (ref 0.0–1.2)
CO2: 20 mmol/L (ref 20–29)
Calcium: 9.4 mg/dL (ref 8.7–10.2)
Chloride: 104 mmol/L (ref 96–106)
Creatinine, Ser: 0.89 mg/dL (ref 0.57–1.00)
GFR calc Af Amer: 103 mL/min/{1.73_m2} (ref >59)
GFR calc non Af Amer: 89 mL/min/{1.73_m2} (ref >59)
Globulin, Total: 2.7 g/dL (ref 1.5–4.5)
Glucose: 77 mg/dL (ref 65–99)
Potassium: 3.7 mmol/L (ref 3.5–5.2)
Sodium: 141 mmol/L (ref 134–144)
Total Protein: 6.5 g/dL (ref 6.0–8.5)

## 2018-10-18 LAB — PROTEIN / CREATININE RATIO, URINE
Creatinine, Urine: 159.1 mg/dL
Protein, Ur: 14.9 mg/dL
Protein/Creat Ratio: 94 mg/g creat (ref 0–200)

## 2018-10-18 LAB — CBC
Hematocrit: 33.5 % — ABNORMAL LOW (ref 34.0–46.6)
Hemoglobin: 10.8 g/dL — ABNORMAL LOW (ref 11.1–15.9)
MCH: 24.9 pg — ABNORMAL LOW (ref 26.6–33.0)
MCHC: 32.2 g/dL (ref 31.5–35.7)
MCV: 77 fL — ABNORMAL LOW (ref 79–97)
Platelets: 469 10*3/uL — ABNORMAL HIGH (ref 150–450)
RBC: 4.33 x10E6/uL (ref 3.77–5.28)
RDW: 14.9 % (ref 11.7–15.4)
WBC: 9.9 10*3/uL (ref 3.4–10.8)

## 2018-10-18 NOTE — ED Notes (Signed)
Patient called for a room with no answer. 

## 2018-10-18 NOTE — ED Notes (Signed)
Called, no answer. Not seen in lobby 

## 2018-10-18 NOTE — ED Notes (Signed)
Patient not seen in lobby.  

## 2018-10-18 NOTE — ED Triage Notes (Addendum)
Pt presents tonight with c/o headache since Friday morning; had c-section on 10/09/18 with spinal epidural; pt says she did have pre-eclampsia and has been on blood pressure medication; actually had her post-partum appt yesterday and did tell her provider about the headaches; was put on higher dose blood pressure medication and told to come to the ED if headaches continue; headaches are always in the same place-left side and moving around to the back of her head; pt awake and alert; talking in complete coherent sentences; pt adds she came in last night to be seen for same but did not wait to be seen as there was too many people in front of her

## 2018-10-19 ENCOUNTER — Emergency Department
Admission: EM | Admit: 2018-10-19 | Discharge: 2018-10-19 | Disposition: A | Discharge status: Home / Self Care | Payer: Medicaid Other | Source: Home / Self Care | Attending: Emergency Medicine | Admitting: Emergency Medicine

## 2018-10-19 DIAGNOSIS — R519 Headache, unspecified: Secondary | ICD-10-CM

## 2018-10-19 LAB — COMPREHENSIVE METABOLIC PANEL
ALT: 22 U/L (ref 0–44)
AST: 21 U/L (ref 15–41)
Albumin: 3.4 g/dL — ABNORMAL LOW (ref 3.5–5.0)
Alkaline Phosphatase: 81 U/L (ref 38–126)
Anion gap: 9 (ref 5–15)
BUN: 19 mg/dL (ref 6–20)
CO2: 22 mmol/L (ref 22–32)
Calcium: 9 mg/dL (ref 8.9–10.3)
Chloride: 108 mmol/L (ref 98–111)
Creatinine, Ser: 0.92 mg/dL (ref 0.44–1.00)
GFR calc Af Amer: >60 mL/min (ref >60)
GFR calc non Af Amer: >60 mL/min (ref >60)
Glucose, Bld: 88 mg/dL (ref 70–99)
Potassium: 3.5 mmol/L (ref 3.5–5.1)
Sodium: 139 mmol/L (ref 135–145)
Total Bilirubin: 0.4 mg/dL (ref 0.3–1.2)
Total Protein: 7.4 g/dL (ref 6.5–8.1)

## 2018-10-19 LAB — CBC WITH DIFFERENTIAL/PLATELET
Abs Immature Granulocytes: 0.03 10*3/uL (ref 0.00–0.07)
Basophils Absolute: 0 10*3/uL (ref 0.0–0.1)
Basophils Relative: 0 %
Eosinophils Absolute: 0.3 10*3/uL (ref 0.0–0.5)
Eosinophils Relative: 3 %
HCT: 35.5 % — ABNORMAL LOW (ref 36.0–46.0)
Hemoglobin: 11.2 g/dL — ABNORMAL LOW (ref 12.0–15.0)
Immature Granulocytes: 0 %
Lymphocytes Relative: 32 %
Lymphs Abs: 3.4 10*3/uL (ref 0.7–4.0)
MCH: 24.8 pg — ABNORMAL LOW (ref 26.0–34.0)
MCHC: 31.5 g/dL (ref 30.0–36.0)
MCV: 78.5 fL — ABNORMAL LOW (ref 80.0–100.0)
Monocytes Absolute: 0.6 10*3/uL (ref 0.1–1.0)
Monocytes Relative: 6 %
Neutro Abs: 6.4 10*3/uL (ref 1.7–7.7)
Neutrophils Relative %: 59 %
Platelets: 476 10*3/uL — ABNORMAL HIGH (ref 150–400)
RBC: 4.52 MIL/uL (ref 3.87–5.11)
RDW: 14.6 % (ref 11.5–15.5)
WBC: 10.8 10*3/uL — ABNORMAL HIGH (ref 4.0–10.5)
nRBC: 0 % (ref 0.0–0.2)

## 2018-10-19 LAB — URINALYSIS, COMPLETE (UACMP) WITH MICROSCOPIC
Bacteria, UA: NONE SEEN
Bilirubin Urine: NEGATIVE
Glucose, UA: NEGATIVE mg/dL
Ketones, ur: NEGATIVE mg/dL
Leukocytes,Ua: NEGATIVE
Nitrite: NEGATIVE
Protein, ur: NEGATIVE mg/dL
Specific Gravity, Urine: 1.014 (ref 1.005–1.030)
Squamous Epithelial / HPF: NONE SEEN (ref 0–5)
pH: 5 (ref 5.0–8.0)

## 2018-10-19 LAB — LACTATE DEHYDROGENASE: LDH: 188 U/L (ref 98–192)

## 2018-10-19 LAB — LIPASE, BLOOD: Lipase: 22 U/L (ref 11–51)

## 2018-10-19 LAB — MAGNESIUM: Magnesium: 1.8 mg/dL (ref 1.7–2.4)

## 2018-10-19 LAB — URIC ACID: Uric Acid, Serum: 8.2 mg/dL — ABNORMAL HIGH (ref 2.5–7.1)

## 2018-10-19 MED ORDER — MAGNESIUM SULFATE 2 GM/50ML IV SOLN
2.0000 g | Freq: Once | INTRAVENOUS | Status: AC
  Administered 2018-10-19: 2 g via INTRAVENOUS
  Filled 2018-10-19: qty 50

## 2018-10-19 MED ORDER — DIPHENHYDRAMINE HCL 50 MG/ML IJ SOLN
12.5000 mg | INTRAMUSCULAR | Status: AC
  Administered 2018-10-19: 12.5 mg via INTRAVENOUS
  Filled 2018-10-19: qty 1

## 2018-10-19 MED ORDER — SODIUM CHLORIDE 0.9 % IV BOLUS
500.0000 mL | Freq: Once | INTRAVENOUS | Status: AC
  Administered 2018-10-19: 500 mL via INTRAVENOUS

## 2018-10-19 MED ORDER — METOCLOPRAMIDE HCL 5 MG/ML IJ SOLN
10.0000 mg | INTRAMUSCULAR | Status: AC
  Administered 2018-10-19: 10 mg via INTRAVENOUS
  Filled 2018-10-19: qty 2

## 2018-10-19 MED ORDER — KETOROLAC TROMETHAMINE 30 MG/ML IJ SOLN
15.0000 mg | Freq: Once | INTRAMUSCULAR | Status: AC
  Administered 2018-10-19: 15 mg via INTRAVENOUS
  Filled 2018-10-19: qty 1

## 2018-10-19 NOTE — Discharge Instructions (Signed)

## 2018-10-19 NOTE — ED Provider Notes (Signed)
[facility] Emergency Department Provider Note  ____________________________________________   First MD Initiated Contact with Patient 10/19/18 [phone]     (approximate)  I have reviewed the triage vital signs and the nursing notes.   HISTORY  Chief Complaint Headache    HPI Ashley Howell is a 28 y.o. female    G4, P4 who delivered her last baby by C-section about 10 days ago with Dr. Garnetta Buddy at [place].  She presents tonight for evaluation of a headache.  She reports that the headache is been persistent for about 10 days since her delivery.  It is no better and no worse although it does wax and wane in severity from mild to severe.  She does not have any visual changes, nausea, vomiting, chest pain or shortness of breath, sore throat, neck pain, or dysuria.  She said she does not have any abdominal pain and she has recovered well from the surgery.  She had a visit at [facility] about 2 days ago and had lab work that was apparently reassuring with no signs of postpartum preeclampsia.  She was evaluated for possible preeclampsia during this pregnancy but was found to be hypertensive without evidence of preeclampsia.  She reports that she was not delivered emergently.  The baby is doing well and she is currently breast-feeding.  She has not had any contact with COVID-19 patients and has no respiratory symptoms.  Her headache is mostly in the back of her head and throbbing, and she feels tired and fatigued but is generally doing well.  Loud noises makes the pain a little bit worse but bright lights do not affect her.  She has had no numbness nor weakness in any of her extremities.  She has no prior history of headaches.        Past Medical History:  Diagnosis Date   Anxiety    Atrial septal defect    Heart defect    ASD-POSSIBLE PFO   Hypertension    ONLY DURING PREGNANCY   Shortness of breath dyspnea     Patient Active Problem List   Diagnosis Date  Noted   History of cesarean delivery 10/09/2018   Labor and delivery, indication for care 09/25/2018   Positive GBS test 09/21/2018   BMI 40.0-44.9, adult ([place]) 09/04/2018   Elevated blood pressure affecting pregnancy in third trimester, antepartum 07/28/2018   Pregnancy with abdominal pain of lower quadrant, antepartum 06/29/2018   Supervision of high risk pregnancy, antepartum 05/26/2018   Benign essential hypertension, antepartum 05/19/2018   PFO (patent foramen ovale) 02/18/2015   Obesity in pregnancy, antepartum 02/15/2015   Previous cesarean delivery, antepartum condition or complication 66/09/3014   History of cardiomegaly 10/28/2014   First trimester screening     Past Surgical History:  Procedure Laterality Date   CESAREAN SECTION  10/28/2008   CESAREAN SECTION N/A 02/22/2015   Procedure: CESAREAN SECTION;  Surgeon: Gae Dry, MD;  Location: [facility] ORS;  Service: Obstetrics;  Laterality: N/A;   CESAREAN SECTION  04/13/2010   CESAREAN SECTION N/A 10/09/2018   Procedure: CESAREAN SECTION;  Surgeon: Homero Fellers, MD;  Location: [facility] ORS;  Service: Obstetrics;  Laterality: N/A;    Prior to Admission medications   Medication Sig Start Date End Date Taking? Authorizing Provider  amLODipine (NORVASC) 5 MG tablet Take 1 tablet (5 mg total) by mouth daily. 10/17/18  Yes Schuman, Christanna R, MD  aspirin 81 MG chewable tablet Chew 81 mg by mouth daily.  Yes [provider]  ibuprofen (ADVIL) 800 MG tablet Take 1 tablet (800 mg total) by mouth every 8 (eight) hours. 10/11/18  Yes Conard NovakJackson, Stephen D, MD  labetalol (NORMODYNE) 100 MG tablet Take 1 tablet (100 mg total) by mouth 2 (two) times daily. 08/12/18  Yes Tresea MallGledhill, Jane, CNM  Prenatal Vit-Fe Fumarate-FA (PRENATAL MULTIVITAMIN) TABS tablet Take 1 tablet by mouth daily at 12 noon.   Yes [provider]    Allergies Patient has no known allergies.  Family History  Problem Relation Age  of Onset   Hypertension Mother    Stroke Paternal Aunt    Cancer Maternal Grandfather        Colon    Social History Social History   Tobacco Use   Smoking status: Current Some Day Smoker    Packs/day: 0.25    Years: 2.00    Pack years: 0.50    Types: Cigarettes   Smokeless tobacco: Never Used  Substance Use Topics   Alcohol use: No   Drug use: No    Review of Systems Constitutional: No fever/chills Eyes: No visual changes. ENT: No sore throat. Cardiovascular: Denies chest pain. Respiratory: Denies shortness of breath. Gastrointestinal: No abdominal pain.  No nausea, no vomiting.  No diarrhea.  No constipation. Genitourinary: Negative for dysuria. Musculoskeletal: Negative for neck pain.  Negative for back pain. Integumentary: Negative for rash. Neurological: Posterior headache for about 10 days, no acute focal numbness nor weakness.   ____________________________________________   PHYSICAL EXAM:  VITAL SIGNS: ED Triage Vitals  Enc Vitals Group     BP 10/18/18 2314 (!) 151/84     Pulse Rate 10/18/18 2314 79     Resp 10/18/18 2314 17     Temp 10/18/18 2314 100 F (37.8 C)     Temp Source 10/18/18 2314 Oral     SpO2 10/18/18 2314 98 %     Weight 10/18/18 2316 (!) 137.4 kg (302 lb 14.6 oz)     Height 10/18/18 2316 1.702 m (5\' 7" )     Head Circumference --      Peak Flow --      Pain Score 10/18/18 2315 9     Pain Loc --      Pain Edu? --      Excl. in GC? --     Constitutional: Alert and oriented. Well appearing and in no acute distress. Eyes: Conjunctivae are normal. PERRL. EOMI. Head: Atraumatic. Nose: No congestion/rhinnorhea. Mouth/Throat: Mucous membranes are moist. Neck: No stridor.  No meningeal signs.   Cardiovascular: Normal rate, regular rhythm. Good peripheral circulation. Grossly normal heart sounds. Respiratory: Normal respiratory effort.  No retractions. No audible wheezing. Gastrointestinal: Soft and nontender. No distention.    Musculoskeletal: No lower extremity tenderness nor edema. No gross deformities of extremities. Neurologic:  Normal speech and language. No gross focal neurologic deficits are appreciated.  Skin:  Skin is warm, dry and intact. No rash noted. Psychiatric: Mood and affect are normal. Speech and behavior are normal.  ____________________________________________   LABS (all labs ordered are listed, but only abnormal results are displayed)  Labs Reviewed  CBC WITH DIFFERENTIAL/PLATELET - Abnormal; Notable for the following components:      Result Value   WBC 10.8 (*)    Hemoglobin 11.2 (*)    HCT 35.5 (*)    MCV 78.5 (*)    MCH 24.8 (*)    Platelets 476 (*)    All other components within normal limits  COMPREHENSIVE METABOLIC  PANEL - Abnormal; Notable for the following components:   Albumin 3.4 (*)    All other components within normal limits  URIC ACID - Abnormal; Notable for the following components:   Uric Acid, Serum 8.2 (*)    All other components within normal limits  URINALYSIS, COMPLETE (UACMP) WITH MICROSCOPIC - Abnormal; Notable for the following components:   Color, Urine STRAW (*)    APPearance CLEAR (*)    Hgb urine dipstick MODERATE (*)    All other components within normal limits  LIPASE, BLOOD  MAGNESIUM  LACTATE DEHYDROGENASE   ____________________________________________  EKG  None - EKG not ordered by ED physician ____________________________________________  RADIOLOGY   ED MD interpretation: No indication for imaging  Official radiology report(s): No results found.  ____________________________________________   PROCEDURES   Procedure(s) performed (including Critical Care):  Procedures   ____________________________________________   INITIAL IMPRESSION / MDM / ASSESSMENT AND PLAN / ED COURSE  As part of my medical decision making, I reviewed the following data within the electronic MEDICAL RECORD NUMBER Nursing notes reviewed and  incorporated, Labs reviewed , Old chart reviewed, Notes from prior ED visits and [place] Controlled Substance Database   Differential diagnosis includes, but is not limited to, nonspecific generalized headache, post LP headache, postpartum preeclampsia, idiopathic intracranial hypertension, intracranial bleed or aneurysm.  The patient is well-appearing and in no distress, ambulatory without difficulty, no focal neurological symptoms.  Vital signs are generally reassuring although she does have a temperature of 100 but she reports no infectious signs or symptoms.  She has not had any contact with COVID-19 patients.  Nothing particular makes her symptoms better or worse and I suspect it might be situational with a newborn and 3 other children, fatigue, and probably decreased oral intake. HELLP labs including urinalysis are pending and I am going to treat the patient as a migraine because I think it will likely make her head feel better and the medications should not cause any significant side effects from breast-feeding: Reglan 10 mg IV, Benadryl 12.5 mg IV, 500 mL normal saline bolus, and magnesium sulfate 2 g IV.  I will reassess after the medications and once the lab results are back.  No indication for emergent imaging.      Clinical Course as of Oct 19 555  Sun Oct 19, 2018  0415 Reassuring CBC  CBC with Differential/Platelet(!) [CF]  [id number] Labs are generally reassuring.  She appears to be a little bit volume depleted based on some thrombocytosis but she has no elevated liver enzymes, normal comprehensive metabolic panel, very slightly elevated uric acid, normal magnesium, normal LDH.  Urinalysis pending.   [CF]  0556 The patient's urinalysis is reassuring with no proteinuria.  She had a nap after her migraine medication treatment and feels much better and is ready to go home.  I gave my usual and customary return precautions.  No indication of an emergent medical condition at this time.   [CF]      Clinical Course User Index [CF] Loleta RoseForbach, Ada Woodbury, MD     ____________________________________________  FINAL CLINICAL IMPRESSION(S) / ED DIAGNOSES  Final diagnoses:  Nonintractable episodic headache, unspecified headache type     MEDICATIONS GIVEN DURING THIS VISIT:  Medications  ketorolac (TORADOL) 30 MG/ML injection 15 mg (15 mg Intravenous Given 10/19/18 0444)  sodium chloride 0.9 % bolus 500 mL (500 mLs Intravenous New Bag/Given 10/19/18 0442)  metoCLOPramide (REGLAN) injection 10 mg (10 mg Intravenous Given 10/19/18 0448)  diphenhydrAMINE (BENADRYL) injection 12.5  mg (12.5 mg Intravenous Given 10/19/18 0445)  magnesium sulfate IVPB 2 g 50 mL (2 g Intravenous New Bag/Given 10/19/18 0449)     ED Discharge Orders    None      *Please note:  Apolinar JunesSamantha S Bushard was evaluated in Emergency Department on 10/19/2018 for the symptoms described in the history of present illness. She was evaluated in the context of the global COVID-19 pandemic, which necessitated consideration that the patient might be at risk for infection with the SARS-CoV-2 virus that causes COVID-19. Institutional protocols and algorithms that pertain to the evaluation of patients at risk for COVID-19 are in a state of rapid change based on information released by regulatory bodies including the CDC and federal and state organizations. These policies and algorithms were followed during the patient's care in the ED.  Some ED evaluations and interventions may be delayed as a result of limited staffing during the pandemic.*  Note:  This document was prepared using Dragon voice recognition software and may include unintentional dictation errors.   Loleta RoseForbach, Luane Rochon, MD 10/19/18 [phone]

## 2018-10-19 NOTE — ED Notes (Addendum)
Pt c/o HA left temporal and back of head, throbbing -- this is same HA going since last visit, pt gave birth on 2 July; 4th child by C-section and unremarkable birth  Pt reports taking 2 meds for HTN, reports good hydration, no apparent sensitivity to light, pt denies loss of faculties

## 2018-10-20 ENCOUNTER — Ambulatory Visit: Payer: Medicaid Other | Admitting: Certified Nurse Midwife

## 2018-10-22 ENCOUNTER — Ambulatory Visit (INDEPENDENT_AMBULATORY_CARE_PROVIDER_SITE_OTHER): Payer: Medicaid Other | Admitting: Maternal Newborn

## 2018-10-22 ENCOUNTER — Encounter: Payer: Self-pay | Admitting: Maternal Newborn

## 2018-10-22 ENCOUNTER — Other Ambulatory Visit: Payer: Self-pay

## 2018-10-22 VITALS — BP 150/90 | Ht 67.0 in | Wt 295.0 lb

## 2018-10-22 DIAGNOSIS — O165 Unspecified maternal hypertension, complicating the puerperium: Secondary | ICD-10-CM

## 2018-10-22 MED ORDER — AMLODIPINE BESYLATE 10 MG PO TABS: 10.0000 mg | ORAL_TABLET | Freq: Every day | ORAL | 1 refills | Status: DC

## 2018-10-22 NOTE — Progress Notes (Signed)
Obstetrics & Gynecology Office Visit   Chief Complaint:  Chief Complaint  Patient presents with  . Blood Pressure Check    History of Present Illness: Here for a blood pressure check due to postpartum hypertension. She is fatigued. She has had a daily headache that is relieved some with Tylenol, radiates to back of her neck and feels like a tension headache. She does not have epigastric pain, chest pain, or visual changes. She was seen at the hospital on 7/12 with a headache and evaluated for pre-eclampsia; labs were negative at that time. She received treatment for a migraine and was discharged. She is concerned today about the healing of her incision and would like it checked.   Review of Systems: Review of systems negative unless otherwise noted in HPI.  Past Medical History:  Past Medical History:  Diagnosis Date  . Anxiety   . Atrial septal defect   . Heart defect    ASD-POSSIBLE PFO  . Hypertension    ONLY DURING PREGNANCY  . Shortness of breath dyspnea     Past Surgical History:  Past Surgical History:  Procedure Laterality Date  . CESAREAN SECTION  10/28/2008  . CESAREAN SECTION N/A 02/22/2015   Procedure: CESAREAN SECTION;  Surgeon: Nadara Mustardobert P Harris, MD;  Location: [facility] ORS;  Service: Obstetrics;  Laterality: N/A;  . CESAREAN SECTION  04/13/2010  . CESAREAN SECTION N/A 10/09/2018   Procedure: CESAREAN SECTION;  Surgeon: Natale MilchSchuman, Christanna R, MD;  Location: [facility] ORS;  Service: Obstetrics;  Laterality: N/A;    Gynecologic History: No LMP recorded.  Obstetric History: [id number]  Family History:  Family History  Problem Relation Age of Onset  . Hypertension Mother   . Stroke Paternal Aunt   . Cancer Maternal Grandfather        Colon    Social History:  Social History   Socioeconomic History  . Marital status: Single    Spouse name: Not on file  . Number of children: 3  . Years of education: Not on file  . Highest education level: Not on file   Occupational History  . Occupation: CNA  Social Needs  . Financial resource strain: Not hard at all  . Food insecurity    Worry: Never true    Inability: Never true  . Transportation needs    Medical: No    Non-medical: No  Tobacco Use  . Smoking status: Current Some Day Smoker    Packs/day: 0.25    Years: 2.00    Pack years: 0.50    Types: Cigarettes  . Smokeless tobacco: Never Used  Substance and Sexual Activity  . Alcohol use: No  . Drug use: No  . Sexual activity: Not Currently    Birth control/protection: Surgical    Comment: BTL  Lifestyle  . Physical activity    Days per week: 4 days    Minutes per session: 30 min  . Stress: Not at all  Relationships  . Social Musicianconnections    Talks on phone: Three times a week    Gets together: Twice a week    Attends religious service: 1 to 4 times per year    Active member of club or organization: No    Attends meetings of clubs or organizations: Never    Relationship status: Living with partner  . Intimate partner violence    Fear of current or ex partner: No    Emotionally abused: No    Physically abused: No  Forced sexual activity: No  Other Topics Concern  . Not on file  Social History Narrative  . Not on file    Allergies:  No Known Allergies  Medications: Prior to Admission medications   Medication Sig Start Date End Date Taking? Authorizing Provider  amLODipine (NORVASC) 5 MG tablet Take 1 tablet (5 mg total) by mouth daily. 10/17/18  Yes Schuman, Christanna R, MD  aspirin 81 MG chewable tablet Chew 81 mg by mouth daily.   Yes [provider]  ibuprofen (ADVIL) 800 MG tablet Take 1 tablet (800 mg total) by mouth every 8 (eight) hours. 10/11/18  Yes Will Bonnet, MD  labetalol (NORMODYNE) 100 MG tablet Take 1 tablet (100 mg total) by mouth 2 (two) times daily. 08/12/18  Yes Rod Can, CNM  Prenatal Vit-Fe Fumarate-FA (PRENATAL MULTIVITAMIN) TABS tablet Take 1 tablet by mouth daily at 12 noon.    Yes [provider]    Physical Exam Vitals:  Vitals:   10/22/18 1455  BP: (!) 150/90   No LMP recorded.  General: NAD HEENT: normocephalic, anicteric Pulmonary: No increased work of breathing Abdomen: Soft, non-tender, non-distended. Incision well-approximated, clean, dry, and intact with no signs of infection or dehiscence Genitourinary: deferred Extremities: no evidence of DVT Neurologic: Grossly intact Psychiatric: mood appropriate, affect full  Assessment: 28 y.o. [id number] here for a postpartum blood pressure check  Plan: Problem List Items Addressed This Visit    None    Visit Diagnoses    Postpartum hypertension    -  Primary   Relevant Medications   amLODipine (NORVASC) 10 MG tablet   Other Relevant Orders   CBC With Differential (Completed)   Comprehensive metabolic panel (Completed)   Protein / creatinine ratio, urine (Completed)     Blood pressure remains elevated in the mild range. Conferred with MD.  Increase Norvasc to 10mg  daily and continue labetalol.  Repeat labs. Follow up next week for a blood pressure check. Return to care or hospital with signs of pre-eclampsia.  Avel Sensor, CNM 10/22/2018

## 2018-10-23 LAB — CBC WITH DIFFERENTIAL
Basophils Absolute: 0 10*3/uL (ref 0.0–0.2)
Basos: 0 %
EOS (ABSOLUTE): 0.2 10*3/uL (ref 0.0–0.4)
Eos: 3 %
Hematocrit: 38.5 % (ref 34.0–46.6)
Hemoglobin: 12.3 g/dL (ref 11.1–15.9)
Immature Grans (Abs): 0 10*3/uL (ref 0.0–0.1)
Immature Granulocytes: 0 %
Lymphocytes Absolute: 2.3 10*3/uL (ref 0.7–3.1)
Lymphs: 26 %
MCH: 24.6 pg — ABNORMAL LOW (ref 26.6–33.0)
MCHC: 31.9 g/dL (ref 31.5–35.7)
MCV: 77 fL — ABNORMAL LOW (ref 79–97)
Monocytes Absolute: 0.5 10*3/uL (ref 0.1–0.9)
Monocytes: 6 %
Neutrophils Absolute: 5.7 10*3/uL (ref 1.4–7.0)
Neutrophils: 65 %
RBC: 5 x10E6/uL (ref 3.77–5.28)
RDW: 14.7 % (ref 11.7–15.4)
WBC: 8.8 10*3/uL (ref 3.4–10.8)

## 2018-10-23 LAB — COMPREHENSIVE METABOLIC PANEL
ALT: 23 IU/L (ref 0–32)
AST: 23 IU/L (ref 0–40)
Albumin/Globulin Ratio: 1.4 (ref 1.2–2.2)
Albumin: 4.2 g/dL (ref 3.9–5.0)
Alkaline Phosphatase: 97 IU/L (ref 39–117)
BUN/Creatinine Ratio: 14 (ref 9–23)
BUN: 13 mg/dL (ref 6–20)
Bilirubin Total: 0.3 mg/dL (ref 0.0–1.2)
CO2: 17 mmol/L — ABNORMAL LOW (ref 20–29)
Calcium: 10 mg/dL (ref 8.7–10.2)
Chloride: 103 mmol/L (ref 96–106)
Creatinine, Ser: 0.94 mg/dL (ref 0.57–1.00)
GFR calc Af Amer: 96 mL/min/{1.73_m2} (ref >59)
GFR calc non Af Amer: 83 mL/min/{1.73_m2} (ref >59)
Globulin, Total: 3 g/dL (ref 1.5–4.5)
Glucose: 78 mg/dL (ref 65–99)
Potassium: 4.4 mmol/L (ref 3.5–5.2)
Sodium: 138 mmol/L (ref 134–144)
Total Protein: 7.2 g/dL (ref 6.0–8.5)

## 2018-10-23 LAB — PROTEIN / CREATININE RATIO, URINE
Creatinine, Urine: 142.1 mg/dL
Protein, Ur: 33.6 mg/dL
Protein/Creat Ratio: 236 mg/g creat — ABNORMAL HIGH (ref 0–200)

## 2018-10-27 ENCOUNTER — Encounter: Payer: Self-pay | Admitting: Obstetrics and Gynecology

## 2018-10-29 ENCOUNTER — Ambulatory Visit: Payer: Medicaid Other | Admitting: Maternal Newborn

## 2018-10-30 ENCOUNTER — Encounter: Payer: Self-pay | Admitting: Certified Nurse Midwife

## 2018-10-30 ENCOUNTER — Telehealth: Payer: Self-pay | Admitting: Certified Nurse Midwife

## 2018-10-30 ENCOUNTER — Other Ambulatory Visit: Payer: Self-pay

## 2018-10-30 ENCOUNTER — Ambulatory Visit (INDEPENDENT_AMBULATORY_CARE_PROVIDER_SITE_OTHER): Payer: Medicaid Other | Admitting: Certified Nurse Midwife

## 2018-10-30 VITALS — BP 130/90 | Ht 67.0 in | Wt 294.0 lb

## 2018-10-30 DIAGNOSIS — I10 Essential (primary) hypertension: Secondary | ICD-10-CM

## 2018-10-30 NOTE — Progress Notes (Signed)
Obstetrics & Gynecology Office Visit   Chief Complaint:  Chief Complaint  Patient presents with  . Blood Pressure Check    History of Present Illness: Here for a blood pressure check due to chronic hypertension that worsened postpartum. She is now 3 weeks postpartum. She is feeling better. No more headaches, although she did not increase her Norvasc dose. She feels less swollen. Has lost 9# over the last 2 weeks.  Baby Ashley Howell is breast and bottle feeding. She is currently taking 5 mgm Norvasc and 100 mgm labetalol BID.  She was seen at the hospital on 7/12 with a headache and evaluated for pre-eclampsia; labs were negative at that time. She received treatment for a migraine and was discharged. Blood pressure was elevated when she had her last visit 7/15: 150/90 and she was instructed to increase her Norvasc to 10 mgm daily, but never heard from pharmacy that the RX was ready for pickup.    Review of Systems: Review of systems negative unless otherwise noted in HPI.  Past Medical History:  Past Medical History:  Diagnosis Date  . Anxiety   . Atrial septal defect   . Heart defect    ASD-POSSIBLE PFO  . Hypertension    ONLY DURING PREGNANCY  . Shortness of breath dyspnea     Past Surgical History:  Past Surgical History:  Procedure Laterality Date  . CESAREAN SECTION  10/28/2008  . CESAREAN SECTION N/A 02/22/2015   Procedure: CESAREAN SECTION;  Surgeon: Gae Dry, MD;  Location: [facility] ORS;  Service: Obstetrics;  Laterality: N/A;  . CESAREAN SECTION  04/13/2010  . CESAREAN SECTION N/A 10/09/2018   Procedure: CESAREAN SECTION;  Surgeon: Homero Fellers, MD;  Location: [facility] ORS;  Service: Obstetrics;  Laterality: N/A;    Gynecologic History: No LMP recorded.  Obstetric History: [id number]  Family History:  Family History  Problem Relation Age of Onset  . Hypertension Mother   . Stroke Paternal Aunt   . Cancer Maternal Grandfather        Colon    Social History:   Social History   Socioeconomic History  . Marital status: Single    Spouse name: Not on file  . Number of children: 3  . Years of education: Not on file  . Highest education level: Not on file  Occupational History  . Occupation: CNA  Social Needs  . Financial resource strain: Not hard at all  . Food insecurity    Worry: Never true    Inability: Never true  . Transportation needs    Medical: No    Non-medical: No  Tobacco Use  . Smoking status: Current Some Day Smoker    Packs/day: 0.25    Years: 2.00    Pack years: 0.50    Types: Cigarettes  . Smokeless tobacco: Never Used  Substance and Sexual Activity  . Alcohol use: No  . Drug use: No  . Sexual activity: Not Currently    Birth control/protection: Surgical    Comment: BTL  Lifestyle  . Physical activity    Days per week: 4 days    Minutes per session: 30 min  . Stress: Not at all  Relationships  . Social Herbalist on phone: Three times a week    Gets together: Twice a week    Attends religious service: 1 to 4 times per year    Active member of club or organization: No    Attends meetings of clubs  or organizations: Never    Relationship status: Living with partner  . Intimate partner violence    Fear of current or ex partner: No    Emotionally abused: No    Physically abused: No    Forced sexual activity: No  Other Topics Concern  . Not on file  Social History Narrative  . Not on file    Allergies:  No Known Allergies  Medications: Prior to Admission medications   Medication Sig Start Date End Date Taking? Authorizing Provider  amLODipine (NORVASC) 5 MG tablet Take 1 tablet (5 mg total) by mouth daily. 10/17/18  Yes Schuman, Christanna R, MD  aspirin 81 MG chewable tablet Chew 81 mg by mouth daily.   Yes [provider]  ibuprofen (ADVIL) 800 MG tablet Take 1 tablet (800 mg total) by mouth every 8 (eight) hours. 10/11/18  Yes Conard NovakJackson, Stephen D, MD  labetalol (NORMODYNE) 100 MG  tablet Take 1 tablet (100 mg total) by mouth 2 (two) times daily. 08/12/18  Yes Tresea MallGledhill, Jane, CNM  Prenatal Vit-Fe Fumarate-FA (PRENATAL MULTIVITAMIN) TABS tablet Take 1 tablet by mouth daily at 12 noon.   Yes [provider]    Physical Exam Vitals: BP 130/90   Ht 5\' 7"  (1.702 m)   Wt 294 lb (133.4 kg)   BMI 46.05 kg/m       General: BF in  NAD HEENT: normocephalic, anicteric Heart: RRR without murmur Pulmonary: No increased work of breathing/ CTAB Extremities: no edema Neurologic: Grossly intact Psychiatric: mood appropriate, affect full  Assessment: 28 y.o. [id number] with mild range blood pressure  Plan: Will have Ashley Howell continue on Norvasc 5 mgm and labetalol 100 mgm BID Return in 1-2 weeks for blood pressure check (4-6 week check up) and Nexplanon insertion.  Ashley Howell, CNM

## 2018-10-30 NOTE — Telephone Encounter (Signed)
Pt coming in on 11/13/2018 at 2:50pm with CLG for nexplanon insertion.

## 2018-11-03 NOTE — Telephone Encounter (Signed)
Noted. Will order to arrive by apt date/time. 

## 2018-11-13 ENCOUNTER — Ambulatory Visit: Payer: Medicaid Other | Admitting: Certified Nurse Midwife

## 2018-11-16 NOTE — Progress Notes (Addendum)
Postpartum Visit  Chief Complaint:  Chief Complaint  Patient presents with  . Postpartum Care    d/c  . Contraception    nexplanon insertion    History of Present Illness: Ashley Howell is a 28 y.o. [id number] who presents for her 6 week postpartum visit.  Date of delivery: 10/09/2018 Type of delivery: LTCS, repeat Episiotomy No.  Laceration:no Pregnancy or labor problems:  Yes, chronic hypertension, morbid obesity, prior CS x 3 Any problems since the delivery:  Yes, hypertension and headache. Currently taking Norvasc 5 mgm daily and labetalol 100 mgm BID. Bleeding stopped 3-4 weeks ago. Had unprotected intercourse 1-2 weeks ago. Negative ICON yesterday Newborn Details:  SINGLETON :  1. Baby's name: Cleotis Lema. Birth weight: 2760 gms (6#1.4oz) Maternal Details:  Breast Feeding:  Yes, breast and bottle feeding Post partum depression/anxiety noted:  no Edinburgh Post-Partum Depression Score: 4 Date of last PAP: normal at [facility] per patient report  Review of Systems: ROS  Past Medical History:  Past Medical History:  Diagnosis Date  . Anxiety   . Atrial septal defect   . Heart defect    ASD-POSSIBLE PFO  . Hypertension    ONLY DURING PREGNANCY  . Shortness of breath dyspnea     Past Surgical History:  Past Surgical History:  Procedure Laterality Date  . CESAREAN SECTION  10/28/2008  . CESAREAN SECTION N/A 02/22/2015   Procedure: CESAREAN SECTION;  Surgeon: Gae Dry, MD;  Location: [facility] ORS;  Service: Obstetrics;  Laterality: N/A;  . CESAREAN SECTION  04/13/2010  . CESAREAN SECTION N/A 10/09/2018   Procedure: CESAREAN SECTION;  Surgeon: Homero Fellers, MD;  Location: [facility] ORS;  Service: Obstetrics;  Laterality: N/A;    Family History:  Family History  Problem Relation Age of Onset  . Hypertension Mother   . Stroke Paternal Aunt   . Cancer Maternal Grandfather        Colon    Social History:  Social History   Socioeconomic History  . Marital  status: Single    Spouse name: Not on file  . Number of children: 4  . Years of education: Not on file  . Highest education level: Not on file  Occupational History  . Occupation: CNA  Social Needs  . Financial resource strain: Not hard at all  . Food insecurity    Worry: Never true    Inability: Never true  . Transportation needs    Medical: No    Non-medical: No  Tobacco Use  . Smoking status: Current Some Day Smoker    Packs/day: 0.25    Years: 2.00    Pack years: 0.50    Types: Cigarettes  . Smokeless tobacco: Never Used  Substance and Sexual Activity  . Alcohol use: No  . Drug use: No  . Sexual activity: Yes    Birth control/protection: Implant  Lifestyle  . Physical activity    Days per week: 4 days    Minutes per session: 30 min  . Stress: Not at all  Relationships  . Social Herbalist on phone: Three times a week    Gets together: Twice a week    Attends religious service: 1 to 4 times per year    Active member of club or organization: No    Attends meetings of clubs or organizations: Never    Relationship status: Living with partner  . Intimate partner violence    Fear of current or ex partner:  No    Emotionally abused: No    Physically abused: No    Forced sexual activity: No  Other Topics Concern  . Not on file  Social History Narrative  . Not on file    Allergies:  No Known Allergies  Medications: Prior to Admission medications   Medication Sig Start Date End Date Taking? Authorizing Provider  amLODipine (NORVASC) 10 MG tablet Take 1 tablet (10 mg total) by mouth daily. 10/22/18   Oswaldo ConroySchmid, Jacelyn Y, CNM  aspirin 81 MG chewable tablet Chew 81 mg by mouth daily.    [provider]  ibuprofen (ADVIL) 800 MG tablet Take 1 tablet (800 mg total) by mouth every 8 (eight) hours. 10/11/18   Conard NovakJackson, Stephen D, MD  labetalol (NORMODYNE) 100 MG tablet Take 1 tablet (100 mg total) by mouth 2 (two) times daily. 08/12/18   Tresea MallGledhill, Jane, CNM   Prenatal Vit-Fe Fumarate-FA (PRENATAL MULTIVITAMIN) TABS tablet Take 1 tablet by mouth daily at 12 noon.    [provider]    Physical Exam Vitals: BP 136/80   Pulse 77   Ht 5\' 7"  (1.702 m)   Wt 295 lb 6.4 oz (134 kg)   LMP  (LMP Unknown)   Breastfeeding Yes   BMI 46.27 kg/m      General: BF in NAD HEENT: normocephalic, anicteric Neck: No thyroid enlargement, no palpable nodules, no cervical lymphadenpathy Breast: Lactating, no inflammation, no masses, nipples intact Pulmonary: No increased work of breathing, CTAB Heart: RRR without murmur Abdomen: Soft, non-tender, non-distended.  Umbilicus without lesions.  No hepatomegaly or masses palpable. No evidence of hernia.Well healed CS scar Genitourinary:  External: no lesions or inflammation    Vagina: Normal vaginal mucosa, no evidence of prolapse.    Cervix: Grossly normal in appearance, no bleeding  Uterus: Well involuted, mobile, non-tender  Adnexa: No adnexal masses, non-tender  Rectal: deferred Extremities: no edema, erythema, or tenderness Neurologic: Grossly intact Psychiatric: mood appropriate, affect full  Assessment: 28 y.o. [id number] for  6 week postpartum visit-normal involution Chronic hypertension- normotensive on Norvasc and labetalol Plan:   1) Contraception Education given regarding options for contraception, including barrier methods, injectable contraception, IUD placement, Nexplanon and minipill. Patient would like to use the minipill for now and then the Nexplanon when she weans the baby. Rx for Camila with instructions for use including how to take pills, possible side effects,  and 3 hour rule.  2) Cervical cancer screening: Pap done  3) Patient underwent screening for postpartum depression with no concerns noted.  4) Discussed return to normal activity, recommend continuing prenatal vitamins.  5) DC labetalol and continue Norvasc.5 mgm daily. FU for BP check in 1 month or sooner if BP  checks at home >140/90.   Farrel Connersolleen Rayjon Wery, CNM

## 2018-11-17 ENCOUNTER — Other Ambulatory Visit: Payer: Self-pay

## 2018-11-17 ENCOUNTER — Other Ambulatory Visit (HOSPITAL_COMMUNITY)
Admission: RE | Admit: 2018-11-17 | Discharge: 2018-11-17 | Disposition: A | Discharge status: Home / Self Care | Payer: Medicaid Other | Source: Ambulatory Visit | Attending: Certified Nurse Midwife | Admitting: Certified Nurse Midwife

## 2018-11-17 ENCOUNTER — Ambulatory Visit (INDEPENDENT_AMBULATORY_CARE_PROVIDER_SITE_OTHER): Payer: Medicaid Other | Admitting: Certified Nurse Midwife

## 2018-11-17 ENCOUNTER — Encounter: Payer: Self-pay | Admitting: Certified Nurse Midwife

## 2018-11-17 VITALS — BP 136/80 | HR 77 | Ht 67.0 in | Wt 295.4 lb

## 2018-11-17 DIAGNOSIS — Z124 Encounter for screening for malignant neoplasm of cervix: Secondary | ICD-10-CM | POA: Diagnosis present

## 2018-11-17 DIAGNOSIS — Z3202 Encounter for pregnancy test, result negative: Secondary | ICD-10-CM | POA: Diagnosis not present

## 2018-11-17 DIAGNOSIS — I10 Essential (primary) hypertension: Secondary | ICD-10-CM

## 2018-11-17 DIAGNOSIS — Z113 Encounter for screening for infections with a predominantly sexual mode of transmission: Secondary | ICD-10-CM

## 2018-11-17 DIAGNOSIS — Z308 Encounter for other contraceptive management: Secondary | ICD-10-CM

## 2018-11-17 LAB — POCT URINE PREGNANCY: Preg Test, Ur: NEGATIVE

## 2018-11-17 MED ORDER — NORETHINDRONE 0.35 MG PO TABS: 1.0000 | ORAL_TABLET | Freq: Every day | ORAL | 11 refills | Status: DC

## 2018-11-20 LAB — CYTOLOGY - PAP
Chlamydia: NEGATIVE
Diagnosis: UNDETERMINED — AB
HPV: DETECTED — AB
Neisseria Gonorrhea: NEGATIVE

## 2018-11-23 ENCOUNTER — Encounter: Payer: Self-pay | Admitting: Certified Nurse Midwife

## 2018-11-28 ENCOUNTER — Other Ambulatory Visit: Payer: Self-pay

## 2018-11-28 ENCOUNTER — Ambulatory Visit: Payer: Medicaid Other | Admitting: Podiatry

## 2018-11-28 ENCOUNTER — Ambulatory Visit (INDEPENDENT_AMBULATORY_CARE_PROVIDER_SITE_OTHER): Payer: Medicaid Other

## 2018-11-28 ENCOUNTER — Encounter: Payer: Self-pay | Admitting: Podiatry

## 2018-11-28 ENCOUNTER — Other Ambulatory Visit: Payer: Self-pay | Admitting: Podiatry

## 2018-11-28 VITALS — Temp 97.2°F

## 2018-11-28 DIAGNOSIS — M722 Plantar fascial fibromatosis: Secondary | ICD-10-CM

## 2018-11-28 MED ORDER — METHYLPREDNISOLONE 4 MG PO TBPK: ORAL_TABLET | ORAL | 0 refills | Status: DC

## 2018-11-28 MED ORDER — MELOXICAM 15 MG PO TABS: 15.0000 mg | ORAL_TABLET | Freq: Every day | ORAL | 1 refills | Status: DC

## 2018-11-30 NOTE — Progress Notes (Signed)
   Subjective: 28 y.o. female presenting today as a new patient with a chief complaint of sharp, dull aching pain of the bilateral heels that began about two weeks ago. She states the right foot is worse than the left and standing from a seated position worsens the pain. Being on the feet all day also increases the pain. She states the pain radiates to the arches of the feet. She has been resting the feet and soaking them in hot water for treatment. Patient is here for further evaluation and treatment.   Past Medical History:  Diagnosis Date  . Anxiety   . Atrial septal defect   . Heart defect    ASD-POSSIBLE PFO  . Hypertension    ONLY DURING PREGNANCY  . Shortness of breath dyspnea      Objective: Physical Exam General: The patient is alert and oriented x3 in no acute distress.  Dermatology: Skin is warm, dry and supple bilateral lower extremities. Negative for open lesions or macerations bilateral.   Vascular: Dorsalis Pedis and Posterior Tibial pulses palpable bilateral.  Capillary fill time is immediate to all digits.  Neurological: Epicritic and protective threshold intact bilateral.   Musculoskeletal: Tenderness to palpation to the plantar aspect of the bilateral heels along the plantar fascia. All other joints range of motion within normal limits bilateral. Strength 5/5 in all groups bilateral.   Radiographic exam: Normal osseous mineralization. Joint spaces preserved. No fracture/dislocation/boney destruction. No other soft tissue abnormalities or radiopaque foreign bodies.   Assessment: 1. plantar fasciitis bilateral feet  Plan of Care:  1. Patient evaluated. Xrays reviewed.   2. Injection of 0.5cc Celestone soluspan injected into the bilateral heels.  3. Rx for Medrol Dose Pak placed 4. Rx for Meloxicam ordered for patient. 5. Recommended good shoe gear.  6. Instructed patient regarding therapies and modalities at home to alleviate symptoms.  7. Return to clinic  in 4 weeks.    Edrick Kins, DPM [facility]  Dr. Edrick Kins, DPM    [address], [place] [address]                Office [phone]  Fax [phone]

## 2018-12-11 ENCOUNTER — Other Ambulatory Visit: Payer: Self-pay

## 2018-12-11 ENCOUNTER — Other Ambulatory Visit (HOSPITAL_COMMUNITY)
Admission: RE | Admit: 2018-12-11 | Discharge: 2018-12-11 | Disposition: A | Discharge status: Home / Self Care | Payer: Medicaid Other | Source: Ambulatory Visit | Attending: Obstetrics and Gynecology | Admitting: Obstetrics and Gynecology

## 2018-12-11 ENCOUNTER — Ambulatory Visit (INDEPENDENT_AMBULATORY_CARE_PROVIDER_SITE_OTHER): Payer: Medicaid Other | Admitting: Obstetrics and Gynecology

## 2018-12-11 ENCOUNTER — Encounter: Payer: Self-pay | Admitting: Obstetrics and Gynecology

## 2018-12-11 VITALS — BP 138/88 | HR 97 | Ht 67.0 in | Wt 294.0 lb

## 2018-12-11 DIAGNOSIS — N870 Mild cervical dysplasia: Secondary | ICD-10-CM | POA: Diagnosis not present

## 2018-12-11 DIAGNOSIS — R87610 Atypical squamous cells of undetermined significance on cytologic smear of cervix (ASC-US): Secondary | ICD-10-CM | POA: Diagnosis not present

## 2018-12-11 DIAGNOSIS — R87810 Cervical high risk human papillomavirus (HPV) DNA test positive: Secondary | ICD-10-CM | POA: Insufficient documentation

## 2018-12-11 DIAGNOSIS — N760 Acute vaginitis: Secondary | ICD-10-CM

## 2018-12-11 NOTE — Progress Notes (Signed)
   GYNECOLOGY CLINIC COLPOSCOPY PROCEDURE NOTE  28 y.o. 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 Discussed underlying role for HPV infection in the development of cervical dysplasia, its natural history and progression/regression, need for surveillance.  Is the patient  pregnant: No LMP: Patient's last menstrual period was 11/19/2018 (exact date). Smoking status:  reports that she has been smoking cigarettes. She has a 0.50 pack-year smoking history. She has never used smokeless tobacco. Contraception: oral progesterone-only contraceptive Future fertility desired:  Yes  Patient given informed consent, signed copy in the chart, time out was performed.  The patient was position in dorsal lithotomy position. Speculum was placed the cervix was visualized.   After application of acetic acid colposcopic inspection of the cervix was undertaken.   Colposcopy adequate, full visualization of transformation zone: Yes mosaicism noted at 12 o'clock; corresponding biopsies obtained.   ECC specimen obtained:  Yes  All specimens were labeled and sent to pathology.   Patient was given post procedure instructions.  Will follow up pathology and manage accordingly.  Routine preventative health maintenance measures emphasized.  Physical Exam Genitourinary:        Adrian Prows MD [facility], [facility] 12/11/2018 9:27 AM

## 2018-12-16 ENCOUNTER — Encounter: Payer: Self-pay | Admitting: Nurse Practitioner

## 2018-12-16 ENCOUNTER — Other Ambulatory Visit: Payer: Self-pay

## 2018-12-16 ENCOUNTER — Ambulatory Visit (INDEPENDENT_AMBULATORY_CARE_PROVIDER_SITE_OTHER): Payer: Medicaid Other | Admitting: Physician Assistant

## 2018-12-16 VITALS — BP 126/90 | HR 70 | Ht 67.0 in | Wt 295.0 lb

## 2018-12-16 DIAGNOSIS — R0789 Other chest pain: Secondary | ICD-10-CM

## 2018-12-16 DIAGNOSIS — Q211 Atrial septal defect, unspecified: Secondary | ICD-10-CM

## 2018-12-16 DIAGNOSIS — F419 Anxiety disorder, unspecified: Secondary | ICD-10-CM

## 2018-12-16 DIAGNOSIS — I10 Essential (primary) hypertension: Secondary | ICD-10-CM | POA: Diagnosis not present

## 2018-12-16 DIAGNOSIS — Q2112 Patent foramen ovale: Secondary | ICD-10-CM

## 2018-12-16 DIAGNOSIS — Z720 Tobacco use: Secondary | ICD-10-CM

## 2018-12-16 DIAGNOSIS — Z6841 Body Mass Index (BMI) 40.0 and over, adult: Secondary | ICD-10-CM

## 2018-12-16 LAB — NUSWAB BV AND CANDIDA, NAA
Candida albicans, NAA: NEGATIVE
Candida glabrata, NAA: NEGATIVE

## 2018-12-16 NOTE — Progress Notes (Signed)
Office Visit    Patient Name: Ashley Howell Date of Encounter: 12/16/2018  Primary Care Provider:  [facility] Primary Cardiologist:  Julien Nordmann, MD  Chief Complaint    28 yo female with PMH of gestational HTN referred for PFO/ASD, HTN, current smoker (less than 5 cigarettes daily), and who is seen for follow-up for elevated BP and SOB per PCP.  Past Medical History    Past Medical History:  Diagnosis Date  . Anxiety   . Hypertension    ONLY DURING PREGNANCY  . PFO (patent foramen ovale)    a. 12/2014 Echo: EF 55-60%, mildly dil LA. PFO w/o significant shunting (reviewed by Dr. Mariah Milling 07/2018).  . Shortness of breath dyspnea    Past Surgical History:  Procedure Laterality Date  . CESAREAN SECTION  10/28/2008  . CESAREAN SECTION N/A 02/22/2015   Procedure: CESAREAN SECTION;  Surgeon: Nadara Mustard, MD;  Location: [facility] ORS;  Service: Obstetrics;  Laterality: N/A;  . CESAREAN SECTION  04/13/2010  . CESAREAN SECTION N/A 10/09/2018   Procedure: CESAREAN SECTION;  Surgeon: Natale Milch, MD;  Location: [facility] ORS;  Service: Obstetrics;  Laterality: N/A;    Allergies  No Known Allergies  History of Present Illness    28 yo female with PMH as above. Previously seen by Telemedicine visit by her primary cardiologist 07/31/2018. At that time, she was doing well on Labetalol 100mg  BID, which she required only during pregnancy due to elevated pressures (and not before pregnancy). An updated echocardiogram was ordered but not completed due to a previous 2016 echo by an outside cardiologist detailing a PFO with possible ASD with shunting. After obtaining the images to this 2016 echo, however, her primary [facility] cardiologist reviewed the study and felt that the shunt was minimal (if any) and likely a small PFO. Further workup was deferred until after delivery of her fourth child. Anticoagulation was not indicated per review of documentation.  She  was seen in the ED for nonintractable episodic headache 10/19/2018 and approximately 10 days after delivering her baby by C-section with Dr. Armanda [facility] at [facility]. BP at that time was 151/84. She received medical treatment with Toradol, Reglan, Benadryl, and magnesium sulfate with subsequent relief and sent home. She was seen by her OBGYN postpartum for HTN and noticed continued fatigue and daily HA, relieved with Tylenol. Given her BP had improved since having her fourth child at additional follow-up, her OB/GYN stopped her labetalol and started amlodipine.  Since that time, she has reportedly done well with no further headaches. She is both breast and bottle feeding her 2 month child. She reported that she does not intend to have further children and actually plans to start birth control soon per her OBGYN. She continues to smoke less than 5 cigarettes daily but reported she "pumps and dumps" after smoking and is trying to quit. She drinks alcohol occasionally but was unable to provide a clear estimate of this frequency. She continues to take amlodipine 5 mg daily for her BP after her OBGYN started this medication and discontinued her labetalol. She does not take her BP at home. She did note that she has noticed occasional sharp and substernal CP that occurs only with exertion and lasts approximately 20 minutes. The CP is non-radiating and non-pleuritic and only occurs on an occasional basis and "when multitasking." The CP occasionally gets better when she adjusts her position, such as sitting up straighter. She admitted that she does suffer from anxiety  and that this may be causing her CP. She does not eat out at restaurants and described a diet with fruits, vegetables, protein, and adequate water intake. She started walking with her children at least 4 days per week for approximately 30 minutes at a time and around a local track for regular exercise. She does note occasional racing HR and SOB with this exertion;  however, she attributed this mainly to her deconditioning. She denied any racing HR or SOB at rest. No reported palpitations. No orthopnea, LEE, early satiety, or any other signs of heart failure.   Home Medications    Prior to Admission medications   Medication Sig Start Date End Date Taking? Authorizing Provider  amLODipine (NORVASC) 5 MG tablet Take 5 mg by mouth daily.    [provider]  aspirin 81 MG chewable tablet Chew 81 mg by mouth daily.    [provider]  norethindrone (MICRONOR) 0.35 MG tablet Take 1 tablet (0.35 mg total) by mouth daily. Patient not taking: Reported on 12/11/2018 11/17/18   Farrel ConnersGutierrez, Colleen, CNM  Prenatal Vit-Fe Fumarate-FA (PRENATAL MULTIVITAMIN) TABS tablet Take 1 tablet by mouth daily at 12 noon.    [provider]    Review of Systems    She denies palpitations, SOB at rest, pnd, orthopnea, n, v, dizziness, syncope, edema, weight gain, or early satiety.  She reported atypical substernal, non-radiating, non-pleuritic CP that occurred only with exertion. All other systems reviewed and are otherwise negative except as noted above.  Physical Exam    VS:  BP 126/90 (BP Location: Left Arm, Patient Position: Sitting, Cuff Size: Large)   Pulse 70   Ht 5\' 7"  (1.702 m)   Wt 295 lb (133.8 kg)   LMP 11/19/2018 (Exact Date)   BMI 46.20 kg/m  , BMI Body mass index is 46.2 kg/m. GEN: Obese female, NAD. HEENT: normal. Neck: Supple, no JVD, carotid bruits, or masses. Cardiac: RRR, no murmurs, rubs, or gallops. No clubbing, cyanosis. Trace b/l LEE.  Radials/DP/PT 2+ and equal bilaterally.  Respiratory:  Respirations regular and unlabored, clear to auscultation bilaterally. GI: Soft, nontender, nondistended, BS + x 4. MS: no deformity or atrophy. Skin: warm and dry, no rash. Neuro:  Strength and sensation are intact. Psych: Normal affect.  Accessory Clinical Findings    ECG personally reviewed by me today - NSR, 70bpm, TWI improved  from previous EKG and only noted in lead III - no acute changes.  The following studies were reviewed today:  Echocardiogram 12/2014 - Left ventricle: The cavity size was normal. Systolic function was normal. The estimated ejection fraction was in the range of 55% to 60%. Wall motion was normal; there were no regional wall motion abnormalities. Left ventricular diastolic function parameters were normal. - Aortic valve: Valve area (Vmax): 2.49 cm^2. - Left atrium: The atrium was mildly dilated. - Atrial septum: There was a patent foramen ovale. Echo contrast study showed no right-to-left atrial level shunt, at baseline or with provocation.  Impressions:  - Possible atrial-septal defec (ASD) with left to right shunt present.  Read by outside cardiologist  Assessment & Plan    Patent foramen ovale / PFO --No SOB. As above, previous 2016 echo reviewed by primary cardiologist with minimal shunt observed, if any. Obtain echo with bubble study, previously deferred due to pregnancy. As previously noted, anticoagulation not indicated at this time. Further recommendations pending echo.   Atypical CP --Substernal and sharp CP that is nonradiating, non-pleuritic and only occurs with exertion.  Occasionally feels better with sitting up straighter. As above, obtain echo as above to assess EF and rule out any wall motion abnormalities.  Benign essential HTN, antepartum --BP well controlled today. Continue amlodipine 5mg . Discussed obtaining a BP cuff for BP checks at home, given Labetalol was discontinued by her OBGYN.   Obesity, BMI 46.20kg /m2 --Lifestyle changes, including diet and exercise discussed in detail.   Current Tobacco use --Cessation advised. Patient indicated that she does intend to quit, given she has four children. She has not been able to use the nicotine patch, given she is currently breast feeding (breast feeding and bottle feeding).   Anxiety --Discussed  adequate sleep and self care.   Disposition: Obtain echo with bubble study, follow-up in 6 months.  Arvil Chaco, PA-C 12/16/2018, 9:18 AM

## 2018-12-16 NOTE — Patient Instructions (Addendum)
Medication Instructions:  Your physician recommends that you continue on your current medications as directed. Please refer to the Current Medication list given to you today.  If you need a refill on your cardiac medications before your next appointment, please call your pharmacy.   Lab work: - None ordered.  If you have labs (blood work) drawn today and your tests are completely normal, you will receive your results only by: Marland Kitchen MyChart Message (if you have MyChart) OR . A paper copy in the mail If you have any lab test that is abnormal or we need to change your treatment, we will call you to review the results.  Testing/Procedures: Your physician has requested that you have an echocardiogram WITH BUBBLE STUDY. Echocardiography is a painless test that uses sound waves to create images of your heart. It provides your doctor with information about the size and shape of your heart and how well your heart's chambers and valves are working. This procedure takes approximately one hour. There are no restrictions for this procedure.    Follow-Up: At [facility], you and your health needs are our priority.  As part of our continuing mission to provide you with exceptional heart care, we have created designated Provider Care Teams.  These Care Teams include your primary Cardiologist (physician) and Advanced Practice Providers (APPs -  Physician Assistants and Nurse Practitioners) who all work together to provide you with the care you need, when you need it. You will need a follow up appointment in 6 months.  Please call our office 2 months in advance to schedule this appointment.  You may see Ida Rogue, MD or one of the following Advanced Practice Providers on your designated Care Team:   Murray Hodgkins, NP Christell Faith, PA-C . Marrianne Mood, PA-C     Echocardiogram An echocardiogram is a procedure that uses painless sound waves (ultrasound) to produce an image of the heart. Images from  an echocardiogram can provide important information about:  Signs of coronary artery disease (CAD).  Aneurysm detection. An aneurysm is a weak or damaged part of an artery wall that bulges out from the normal force of blood pumping through the body.  Heart size and shape. Changes in the size or shape of the heart can be associated with certain conditions, including heart failure, aneurysm, and CAD.  Heart muscle function.  Heart valve function.  Signs of a past heart attack.  Fluid buildup around the heart.  Thickening of the heart muscle.  A tumor or infectious growth around the heart valves. Tell a health care provider about:  Any allergies you have.  All medicines you are taking, including vitamins, herbs, eye drops, creams, and over-the-counter medicines.  Any blood disorders you have.  Any surgeries you have had.  Any medical conditions you have.  Whether you are pregnant or may be pregnant. What are the risks? Generally, this is a safe procedure. However, problems may occur, including:  Allergic reaction to dye (contrast) that may be used during the procedure. What happens before the procedure? No specific preparation is needed. You may eat and drink normally. What happens during the procedure?   An IV tube may be inserted into one of your veins.  You may receive contrast through this tube. A contrast is an injection that improves the quality of the pictures from your heart.  A gel will be applied to your chest.  A wand-like tool (transducer) will be moved over your chest. The gel will help to  transmit the sound waves from the transducer.  The sound waves will harmlessly bounce off of your heart to allow the heart images to be captured in real-time motion. The images will be recorded on a computer. The procedure may vary among health care providers and hospitals. What happens after the procedure?  You may return to your normal, everyday life, including diet,  activities, and medicines, unless your health care provider tells you not to do that. Summary  An echocardiogram is a procedure that uses painless sound waves (ultrasound) to produce an image of the heart.  Images from an echocardiogram can provide important information about the size and shape of your heart, heart muscle function, heart valve function, and fluid buildup around your heart.  You do not need to do anything to prepare before this procedure. You may eat and drink normally.  After the echocardiogram is completed, you may return to your normal, everyday life, unless your health care provider tells you not to do that. This information is not intended to replace advice given to you by your health care provider. Make sure you discuss any questions you have with your health care provider. Document Released: 03/23/2000 Document Revised: 07/17/2018 Document Reviewed: 04/28/2016 Elsevier Patient Education  2020 [employer]

## 2018-12-17 ENCOUNTER — Ambulatory Visit: Payer: Medicaid Other | Admitting: Certified Nurse Midwife

## 2018-12-17 ENCOUNTER — Telehealth: Payer: Self-pay

## 2018-12-17 NOTE — Progress Notes (Signed)
CIN 1, repeat pap smear in 1 year

## 2018-12-17 NOTE — Telephone Encounter (Signed)
Called and discussed

## 2018-12-17 NOTE — Telephone Encounter (Signed)
Patient inquiring about her Colposcopy biopsy results. [phone]

## 2018-12-19 ENCOUNTER — Other Ambulatory Visit: Payer: Self-pay | Admitting: Obstetrics and Gynecology

## 2018-12-25 ENCOUNTER — Other Ambulatory Visit: Payer: Self-pay

## 2018-12-25 ENCOUNTER — Encounter: Payer: Self-pay | Admitting: Certified Nurse Midwife

## 2018-12-25 ENCOUNTER — Ambulatory Visit (INDEPENDENT_AMBULATORY_CARE_PROVIDER_SITE_OTHER): Payer: Medicaid Other | Admitting: Certified Nurse Midwife

## 2018-12-25 VITALS — BP 130/82 | HR 79 | Ht 67.0 in | Wt 295.0 lb

## 2018-12-25 DIAGNOSIS — O165 Unspecified maternal hypertension, complicating the puerperium: Secondary | ICD-10-CM

## 2018-12-25 DIAGNOSIS — Z0130 Encounter for examination of blood pressure without abnormal findings: Secondary | ICD-10-CM | POA: Diagnosis not present

## 2018-12-25 DIAGNOSIS — I10 Essential (primary) hypertension: Secondary | ICD-10-CM

## 2018-12-25 NOTE — Progress Notes (Signed)
  History of Present Illness:  Ashley Howell is a 28 y.o. G5 P4014 who presents for a blood pressure check. She has a history of  chronic hypertension that worsened postpartum. She is now 2 1/2 months postpartum. At the time of her 6 week check up she was advised to discontinue her labetalol 100 mgm BID and continue Norvasc 5 mgm daily. She was also seen by cardiology on 9/8 and will be having a bubble study done. She has been feeling well.  Has not started her minipill for contraception, as she is not sexually active. She continues to breast and bottle feed. Desires Nexplanon when done breast feeding. PMHx: She  has a past medical history of Anxiety, Hypertension, PFO (patent foramen ovale), and Shortness of breath dyspnea. Also,  has a past surgical history that includes Cesarean section (10/28/2008); Cesarean section (N/A, 02/22/2015); Cesarean section (04/13/2010); and Cesarean section (N/A, 10/09/2018)., family history includes Cancer in her maternal grandfather; Hypertension in her mother; Stroke in her paternal aunt.,  reports that she has been smoking cigarettes. She has a 0.50 pack-year smoking history. She has never used smokeless tobacco. She reports that she does not drink alcohol or use drugs.  She has a current medication list which includes the following prescription(s): amlodipine, aspirin, prenatal multivitamin, and norethindrone. Also, has No Known Allergies.  ROS  Physical Exam:  BP 130/82   Pulse 79   Ht 5\' 7"  (1.702 m)   Wt 295 lb (133.8 kg)   LMP 12/24/2018   Breastfeeding Yes   BMI 46.20 kg/m  Body mass index is 46.2 kg/m. Constitutional: obese BF female in no acute distress.  Heart: RRR without murmur Lungs: CTAB, normal respiratory effort Neuro: Grossly intact Psych:  Normal mood and affect.  Extremities: +1 ankle edema   Assessment: CHTN on Norvasc, with normal blood pressure  Plan: She will undergo no change in her medical therapy. Continue on Norvasc 5 mgm  daily. Follow up with cardiology and PCP for [facility]  She was amenable to this plan and we will see her back for annual/PRN.  Dalia Heading, [facility], [facility]

## 2018-12-26 ENCOUNTER — Other Ambulatory Visit: Payer: Self-pay

## 2019-01-01 ENCOUNTER — Other Ambulatory Visit: Payer: Self-pay

## 2019-01-01 ENCOUNTER — Ambulatory Visit (INDEPENDENT_AMBULATORY_CARE_PROVIDER_SITE_OTHER): Payer: Medicaid Other

## 2019-01-01 DIAGNOSIS — Q211 Atrial septal defect, unspecified: Secondary | ICD-10-CM

## 2019-01-01 DIAGNOSIS — Z91410 Personal history of adult physical and sexual abuse: Secondary | ICD-10-CM

## 2019-01-02 ENCOUNTER — Other Ambulatory Visit: Payer: Self-pay

## 2019-01-02 ENCOUNTER — Ambulatory Visit: Payer: Medicaid Other | Admitting: Podiatry

## 2019-01-06 ENCOUNTER — Ambulatory Visit: Payer: Medicaid Other | Admitting: Podiatry

## 2019-01-20 ENCOUNTER — Ambulatory Visit: Payer: Medicaid Other | Admitting: Podiatry

## 2019-01-24 ENCOUNTER — Encounter: Payer: Self-pay | Admitting: Emergency Medicine

## 2019-01-24 ENCOUNTER — Other Ambulatory Visit: Payer: Self-pay

## 2019-01-24 ENCOUNTER — Emergency Department
Admission: EM | Admit: 2019-01-24 | Discharge: 2019-01-24 | Disposition: A | Discharge status: Home / Self Care | Payer: Medicaid Other | Attending: Emergency Medicine | Admitting: Emergency Medicine

## 2019-01-24 DIAGNOSIS — I10 Essential (primary) hypertension: Secondary | ICD-10-CM | POA: Insufficient documentation

## 2019-01-24 DIAGNOSIS — Z7982 Long term (current) use of aspirin: Secondary | ICD-10-CM | POA: Diagnosis not present

## 2019-01-24 DIAGNOSIS — J029 Acute pharyngitis, unspecified: Secondary | ICD-10-CM | POA: Insufficient documentation

## 2019-01-24 DIAGNOSIS — F17210 Nicotine dependence, cigarettes, uncomplicated: Secondary | ICD-10-CM | POA: Diagnosis not present

## 2019-01-24 LAB — GROUP A STREP BY PCR: Group A Strep by PCR: NOT DETECTED

## 2019-01-24 MED ORDER — LIDOCAINE VISCOUS HCL 2 % MT SOLN: 5.0000 mL | Freq: Four times a day (QID) | OROMUCOSAL | 0 refills | Status: DC | PRN

## 2019-01-24 MED ORDER — PROMETHAZINE-DM 6.25-15 MG/5ML PO SYRP: 5.0000 mL | ORAL_SOLUTION | Freq: Four times a day (QID) | ORAL | 0 refills | Status: DC | PRN
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
## 2019-01-24 NOTE — ED Triage Notes (Signed)
Sore throat since yesterday.

## 2019-01-24 NOTE — ED Notes (Signed)
See triage note.  States she had her flu shot on Thursday   Then states she felt bad  Runny nose and sore throat   Denies any fever

## 2019-01-30 ENCOUNTER — Ambulatory Visit: Payer: Medicaid Other | Admitting: Podiatry

## 2019-02-21 ENCOUNTER — Other Ambulatory Visit: Payer: Self-pay | Admitting: Obstetrics and Gynecology

## 2019-03-10 ENCOUNTER — Other Ambulatory Visit: Payer: Self-pay

## 2019-03-10 ENCOUNTER — Emergency Department: Payer: Medicaid Other

## 2019-03-10 ENCOUNTER — Emergency Department
Admission: EM | Admit: 2019-03-10 | Discharge: 2019-03-10 | Disposition: A | Discharge status: Home / Self Care | Payer: Medicaid Other | Attending: Student in an Organized Health Care Education/Training Program | Admitting: Student in an Organized Health Care Education/Training Program

## 2019-03-10 ENCOUNTER — Encounter: Payer: Self-pay | Admitting: Emergency Medicine

## 2019-03-10 DIAGNOSIS — Z7982 Long term (current) use of aspirin: Secondary | ICD-10-CM | POA: Diagnosis not present

## 2019-03-10 DIAGNOSIS — F17210 Nicotine dependence, cigarettes, uncomplicated: Secondary | ICD-10-CM | POA: Insufficient documentation

## 2019-03-10 DIAGNOSIS — R0789 Other chest pain: Secondary | ICD-10-CM | POA: Diagnosis not present

## 2019-03-10 DIAGNOSIS — R079 Chest pain, unspecified: Secondary | ICD-10-CM | POA: Diagnosis present

## 2019-03-10 DIAGNOSIS — R0602 Shortness of breath: Secondary | ICD-10-CM | POA: Insufficient documentation

## 2019-03-10 DIAGNOSIS — I10 Essential (primary) hypertension: Secondary | ICD-10-CM | POA: Diagnosis not present

## 2019-03-10 DIAGNOSIS — Z79899 Other long term (current) drug therapy: Secondary | ICD-10-CM | POA: Diagnosis not present

## 2019-03-10 LAB — CBC
HCT: 41.5 % (ref 36.0–46.0)
Hemoglobin: 13.6 g/dL (ref 12.0–15.0)
MCH: 25.1 pg — ABNORMAL LOW (ref 26.0–34.0)
MCHC: 32.8 g/dL (ref 30.0–36.0)
MCV: 76.6 fL — ABNORMAL LOW (ref 80.0–100.0)
Platelets: 338 10*3/uL (ref 150–400)
RBC: 5.42 MIL/uL — ABNORMAL HIGH (ref 3.87–5.11)
RDW: 14.6 % (ref 11.5–15.5)
WBC: 9.5 10*3/uL (ref 4.0–10.5)
nRBC: 0 % (ref 0.0–0.2)

## 2019-03-10 LAB — TROPONIN I (HIGH SENSITIVITY)
Troponin I (High Sensitivity): <2 ng/L (ref <18)
Troponin I (High Sensitivity): <2 ng/L (ref <18)

## 2019-03-10 LAB — BASIC METABOLIC PANEL
Anion gap: 9 (ref 5–15)
BUN: 13 mg/dL (ref 6–20)
CO2: 24 mmol/L (ref 22–32)
Calcium: 9.3 mg/dL (ref 8.9–10.3)
Chloride: 105 mmol/L (ref 98–111)
Creatinine, Ser: 0.72 mg/dL (ref 0.44–1.00)
GFR calc Af Amer: >60 mL/min (ref >60)
GFR calc non Af Amer: >60 mL/min (ref >60)
Glucose, Bld: 90 mg/dL (ref 70–99)
Potassium: 3.6 mmol/L (ref 3.5–5.1)
Sodium: 138 mmol/L (ref 135–145)

## 2019-03-10 LAB — POCT PREGNANCY, URINE: Preg Test, Ur: NEGATIVE

## 2019-03-10 MED ORDER — LORAZEPAM 0.5 MG PO TABS: 0.5000 mg | ORAL_TABLET | Freq: Three times a day (TID) | ORAL | 0 refills | Status: DC | PRN

## 2019-03-10 MED ORDER — LORAZEPAM 1 MG PO TABS
1.0000 mg | ORAL_TABLET | Freq: Once | ORAL | Status: DC
  Filled 2019-03-10: qty 1

## 2019-03-10 NOTE — ED Triage Notes (Signed)
Pt reports CP since Friday and then yesterday started with intermittent SOB. Pt reports hx of heart problems and saw her cardiologist but he said all was fine.
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
## 2019-03-10 NOTE — ED Notes (Signed)
Pt states CP and SOB x1wk. States she thinks it is anxiety. PT texting on phone, sitting up in bed. NAD noted

## 2019-03-10 NOTE — ED Notes (Signed)
Pt verbalize d/c undertanding and follow up . Pt in NAD. VS. PT unable to sign due to signature pad malfx

## 2019-03-12 ENCOUNTER — Ambulatory Visit: Payer: Medicaid Other | Admitting: Nurse Practitioner

## 2019-03-12 ENCOUNTER — Ambulatory Visit (INDEPENDENT_AMBULATORY_CARE_PROVIDER_SITE_OTHER): Payer: Medicaid Other | Admitting: Obstetrics and Gynecology

## 2019-03-12 ENCOUNTER — Ambulatory Visit: Payer: Medicaid Other | Admitting: Obstetrics and Gynecology

## 2019-03-12 ENCOUNTER — Other Ambulatory Visit (HOSPITAL_COMMUNITY)
Admission: RE | Admit: 2019-03-12 | Discharge: 2019-03-12 | Disposition: A | Discharge status: Home / Self Care | Payer: Medicaid Other | Source: Ambulatory Visit | Attending: Obstetrics and Gynecology | Admitting: Obstetrics and Gynecology

## 2019-03-12 ENCOUNTER — Encounter: Payer: Self-pay | Admitting: Obstetrics and Gynecology

## 2019-03-12 ENCOUNTER — Other Ambulatory Visit: Payer: Self-pay

## 2019-03-12 VITALS — BP 140/90 | Ht 67.0 in | Wt 297.0 lb

## 2019-03-12 DIAGNOSIS — R350 Frequency of micturition: Secondary | ICD-10-CM | POA: Diagnosis not present

## 2019-03-12 DIAGNOSIS — Z30017 Encounter for initial prescription of implantable subdermal contraceptive: Secondary | ICD-10-CM | POA: Diagnosis not present

## 2019-03-12 DIAGNOSIS — Z113 Encounter for screening for infections with a predominantly sexual mode of transmission: Secondary | ICD-10-CM | POA: Insufficient documentation

## 2019-03-12 DIAGNOSIS — B3731 Acute candidiasis of vulva and vagina: Secondary | ICD-10-CM

## 2019-03-12 DIAGNOSIS — B373 Candidiasis of vulva and vagina: Secondary | ICD-10-CM | POA: Diagnosis not present

## 2019-03-12 LAB — POCT URINALYSIS DIPSTICK
Bilirubin, UA: NEGATIVE
Blood, UA: NEGATIVE
Glucose, UA: NEGATIVE
Ketones, UA: NEGATIVE
Nitrite, UA: NEGATIVE
Protein, UA: NEGATIVE
Spec Grav, UA: 1.025 (ref 1.010–1.025)
pH, UA: 5 (ref 5.0–8.0)

## 2019-03-12 LAB — POCT WET PREP WITH KOH
Clue Cells Wet Prep HPF POC: NEGATIVE
KOH Prep POC: NEGATIVE
Trichomonas, UA: NEGATIVE
Yeast Wet Prep HPF POC: POSITIVE

## 2019-03-12 MED ORDER — FLUCONAZOLE 150 MG PO TABS: 150.0000 mg | ORAL_TABLET | Freq: Once | ORAL | 0 refills | Status: DC

## 2019-03-12 NOTE — Patient Instructions (Signed)
I value your feedback and entrusting us with your care. If you get a [facility] patient survey, I would appreciate you taking the time to let us know about your experience today. Thank you! 
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
## 2019-03-14 ENCOUNTER — Encounter: Payer: Self-pay | Admitting: Obstetrics and Gynecology

## 2019-03-14 LAB — URINE CULTURE

## 2019-03-16 LAB — CERVICOVAGINAL ANCILLARY ONLY
Chlamydia: NEGATIVE
Comment: NEGATIVE
Comment: NEGATIVE
Comment: NORMAL
Neisseria Gonorrhea: NEGATIVE
Trichomonas: NEGATIVE

## 2019-04-04 ENCOUNTER — Emergency Department
Admission: EM | Admit: 2019-04-04 | Discharge: 2019-04-04 | Disposition: A | Discharge status: Home / Self Care | Payer: Medicaid Other | Attending: Emergency Medicine | Admitting: Emergency Medicine

## 2019-04-04 ENCOUNTER — Other Ambulatory Visit: Payer: Self-pay

## 2019-04-04 ENCOUNTER — Encounter: Payer: Self-pay | Admitting: Emergency Medicine

## 2019-04-04 DIAGNOSIS — Z79899 Other long term (current) drug therapy: Secondary | ICD-10-CM | POA: Insufficient documentation

## 2019-04-04 DIAGNOSIS — J029 Acute pharyngitis, unspecified: Secondary | ICD-10-CM | POA: Insufficient documentation

## 2019-04-04 DIAGNOSIS — Z20828 Contact with and (suspected) exposure to other viral communicable diseases: Secondary | ICD-10-CM | POA: Insufficient documentation

## 2019-04-04 DIAGNOSIS — F17210 Nicotine dependence, cigarettes, uncomplicated: Secondary | ICD-10-CM | POA: Insufficient documentation

## 2019-04-04 DIAGNOSIS — Z7982 Long term (current) use of aspirin: Secondary | ICD-10-CM | POA: Diagnosis not present

## 2019-04-04 DIAGNOSIS — I10 Essential (primary) hypertension: Secondary | ICD-10-CM | POA: Diagnosis not present

## 2019-04-04 LAB — POC SARS CORONAVIRUS 2 AG: SARS Coronavirus 2 Ag: NEGATIVE

## 2019-04-04 LAB — GROUP A STREP BY PCR: Group A Strep by PCR: NOT DETECTED

## 2019-04-04 MED ORDER — AZITHROMYCIN 250 MG PO TABS: ORAL_TABLET | ORAL | 0 refills | Status: DC

## 2019-04-04 NOTE — ED Notes (Signed)
Pt states she has a sore throat. Pt with enlarged tonsils but no white patches noted.

## 2019-04-04 NOTE — ED Triage Notes (Signed)
Pt arrived via POV with reports of sore throat and facial pain x 2 days, pt does report some PND.  Pt states she was tested for COVID on Wednesday and was negative.
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
## 2019-04-05 LAB — SARS CORONAVIRUS 2 (TAT 6-24 HRS): SARS Coronavirus 2: NEGATIVE

## 2019-05-19 ENCOUNTER — Other Ambulatory Visit: Payer: Self-pay

## 2019-05-19 ENCOUNTER — Emergency Department
Admission: EM | Admit: 2019-05-19 | Discharge: 2019-05-19 | Disposition: A | Discharge status: Home / Self Care | Payer: Medicaid Other | Attending: Emergency Medicine | Admitting: Emergency Medicine

## 2019-05-19 DIAGNOSIS — F17210 Nicotine dependence, cigarettes, uncomplicated: Secondary | ICD-10-CM | POA: Diagnosis not present

## 2019-05-19 DIAGNOSIS — R202 Paresthesia of skin: Secondary | ICD-10-CM

## 2019-05-19 DIAGNOSIS — U071 COVID-19: Secondary | ICD-10-CM | POA: Insufficient documentation

## 2019-05-19 DIAGNOSIS — I10 Essential (primary) hypertension: Secondary | ICD-10-CM | POA: Insufficient documentation

## 2019-05-19 LAB — CBC WITH DIFFERENTIAL/PLATELET
Abs Immature Granulocytes: 0.03 10*3/uL (ref 0.00–0.07)
Basophils Absolute: 0 10*3/uL (ref 0.0–0.1)
Basophils Relative: 0 %
Eosinophils Absolute: 0.1 10*3/uL (ref 0.0–0.5)
Eosinophils Relative: 1 %
HCT: 40.2 % (ref 36.0–46.0)
Hemoglobin: 12.6 g/dL (ref 12.0–15.0)
Immature Granulocytes: 0 %
Lymphocytes Relative: 23 %
Lymphs Abs: 2.5 10*3/uL (ref 0.7–4.0)
MCH: 25.5 pg — ABNORMAL LOW (ref 26.0–34.0)
MCHC: 31.3 g/dL (ref 30.0–36.0)
MCV: 81.4 fL (ref 80.0–100.0)
Monocytes Absolute: 0.6 10*3/uL (ref 0.1–1.0)
Monocytes Relative: 6 %
Neutro Abs: 7.5 10*3/uL (ref 1.7–7.7)
Neutrophils Relative %: 70 %
Platelets: 338 10*3/uL (ref 150–400)
RBC: 4.94 MIL/uL (ref 3.87–5.11)
RDW: 15.9 % — ABNORMAL HIGH (ref 11.5–15.5)
WBC: 10.7 10*3/uL — ABNORMAL HIGH (ref 4.0–10.5)
nRBC: 0 % (ref 0.0–0.2)

## 2019-05-19 LAB — COMPREHENSIVE METABOLIC PANEL
ALT: 26 U/L (ref 0–44)
AST: 21 U/L (ref 15–41)
Albumin: 4 g/dL (ref 3.5–5.0)
Alkaline Phosphatase: 72 U/L (ref 38–126)
Anion gap: 8 (ref 5–15)
BUN: 10 mg/dL (ref 6–20)
CO2: 25 mmol/L (ref 22–32)
Calcium: 9.2 mg/dL (ref 8.9–10.3)
Chloride: 107 mmol/L (ref 98–111)
Creatinine, Ser: 0.7 mg/dL (ref 0.44–1.00)
GFR calc Af Amer: >60 mL/min (ref >60)
GFR calc non Af Amer: >60 mL/min (ref >60)
Glucose, Bld: 102 mg/dL — ABNORMAL HIGH (ref 70–99)
Potassium: 3.4 mmol/L — ABNORMAL LOW (ref 3.5–5.1)
Sodium: 140 mmol/L (ref 135–145)
Total Bilirubin: 0.8 mg/dL (ref 0.3–1.2)
Total Protein: 7.6 g/dL (ref 6.5–8.1)

## 2019-05-19 MED ORDER — IBUPROFEN 800 MG PO TABS
800.0000 mg | ORAL_TABLET | Freq: Once | ORAL | Status: AC
  Administered 2019-05-19: 21:00:00 800 mg via ORAL
  Filled 2019-05-19: qty 1
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
## 2019-05-19 NOTE — ED Triage Notes (Signed)
Pt arrives via POV from home with c/o pain numbness/tingling on entire right side of body stopping at right knee. Pt states she feels weaker in her right arm and hand starting 2/4. Pt reports testing covid + on 1/23 Ambulatory to triage. Denied any dizziness/lightheadedness or visual changes. NAD noted at this time.

## 2019-05-19 NOTE — ED Triage Notes (Signed)
FIRST NURSE NOTE- here for right side body pain/numbness since 05/14/19.  Ambulatory, NAD

## 2019-05-20 LAB — POCT PREGNANCY, URINE: Preg Test, Ur: NEGATIVE

## 2019-05-20 NOTE — Progress Notes (Signed)
Center, [facility]   Chief Complaint  Patient presents with  . STD testing    HPI:      [name] is a 29 y.o. [id number] who LMP was Patient's last menstrual period was 05/15/2019 (exact date)., presents today for STD testing. No vag sx. Had recent unprotected sex. Has had a normal period since. Doesn't plan to be active again.  Neg STD testing 12/20   Past Medical History:  Diagnosis Date  . Anxiety   . Hypertension    ONLY DURING PREGNANCY  . PFO (patent foramen ovale)    a. 12/2014 Echo: EF 55-60%, mildly dil LA. PFO w/o significant shunting (reviewed by Dr. Mariah Milling 07/2018).  . Shortness of breath dyspnea     Past Surgical History:  Procedure Laterality Date  . CESAREAN SECTION  10/28/2008  . CESAREAN SECTION N/A 02/22/2015   Procedure: CESAREAN SECTION;  Surgeon: Nadara Mustard, MD;  Location: [facility] ORS;  Service: Obstetrics;  Laterality: N/A;  . CESAREAN SECTION  04/13/2010  . CESAREAN SECTION N/A 10/09/2018   Procedure: CESAREAN SECTION;  Surgeon: Natale Milch, MD;  Location: [facility] ORS;  Service: Obstetrics;  Laterality: N/A;    Family History  Problem Relation Age of Onset  . Hypertension Mother   . Stroke Paternal Aunt   . Cancer Maternal Grandfather        Colon    Social History   Socioeconomic History  . Marital status: Single    Spouse name: Not on file  . Number of children: 4  . Years of education: Not on file  . Highest education level: Not on file  Occupational History  . Occupation: CNA  Tobacco Use  . Smoking status: Current Some Day Smoker    Packs/day: 0.25    Years: 2.00    Pack years: 0.50    Types: Cigarettes  . Smokeless tobacco: Never Used  Substance and Sexual Activity  . Alcohol use: No  . Drug use: No  . Sexual activity: Yes    Birth control/protection: None  Other Topics Concern  . Not on file  Social History Narrative  . Not on file   Social Determinants of Health   Financial  Resource Strain: Low Risk   . Difficulty of Paying Living Expenses: Not hard at all  Food Insecurity: No Food Insecurity  . Worried About Programme researcher, broadcasting/film/video in the Last Year: Never true  . Ran Out of Food in the Last Year: Never true  Transportation Needs: No Transportation Needs  . Lack of Transportation (Medical): No  . Lack of Transportation (Non-Medical): No  Physical Activity: Insufficiently Active  . Days of Exercise per Week: 4 days  . Minutes of Exercise per Session: 30 min  Stress: No Stress Concern Present  . Feeling of Stress : Not at all  Social Connections: Slightly Isolated  . Frequency of Communication with Friends and Family: Three times a week  . Frequency of Social Gatherings with Friends and Family: Twice a week  . Attends Religious Services: 1 to 4 times per year  . Active Member of Clubs or Organizations: No  . Attends Banker Meetings: Never  . Marital Status: Living with partner  Intimate Partner Violence: Not At Risk  . Fear of Current or Ex-Partner: No  . Emotionally Abused: No  . Physically Abused: No  . Sexually Abused: No    Outpatient Medications Prior to Visit  Medication Sig Dispense Refill  .  amLODipine (NORVASC) 5 MG tablet Take 1 tablet by mouth once daily 60 tablet 0  . aspirin 81 MG chewable tablet Chew 81 mg by mouth daily.    Marland Kitchen azithromycin (ZITHROMAX Z-PAK) 250 MG tablet 2 pills today then 1 pill a day for 4 days 6 each 0   No facility-administered medications prior to visit.      ROS:  Review of Systems  Constitutional: Negative for fever.  Gastrointestinal: Negative for blood in stool, constipation, diarrhea, nausea and vomiting.  Genitourinary: Negative for dyspareunia, dysuria, flank pain, frequency, hematuria, urgency, vaginal bleeding, vaginal discharge and vaginal pain.  Musculoskeletal: Negative for back pain.  Skin: Negative for rash.    OBJECTIVE:   Vitals:  BP 130/90   Ht 5\' 7"  (1.702 m)   Wt 292 lb  (132.5 kg)   LMP 05/15/2019 (Exact Date)   Breastfeeding No   BMI 45.73 kg/m   Physical Exam Vitals reviewed.  Constitutional:      Appearance: She is well-developed.  Pulmonary:     Effort: Pulmonary effort is normal.  Genitourinary:    General: Normal vulva.     Pubic Area: No rash.      Labia:        Right: No rash, tenderness or lesion.        Left: No rash, tenderness or lesion.      Vagina: Normal. No vaginal discharge, erythema or tenderness.     Cervix: Normal.     Uterus: Normal. Not enlarged and not tender.      Adnexa: Right adnexa normal and left adnexa normal.       Right: No mass or tenderness.         Left: No mass or tenderness.    Musculoskeletal:        General: Normal range of motion.     Cervical back: Normal range of motion.  Skin:    General: Skin is warm and dry.  Neurological:     General: No focal deficit present.     Mental Status: She is alert and oriented to person, place, and time.  Psychiatric:        Mood and Affect: Mood normal.        Behavior: Behavior normal.        Thought Content: Thought content normal.        Judgment: Judgment normal.     Assessment/Plan: Screening for STD (sexually transmitted disease) - Plan: Cervicovaginal ancillary only    Return if symptoms worsen or fail to improve.   B. , PA-C 05/21/2019 10:17 AM

## 2019-05-21 ENCOUNTER — Encounter: Payer: Self-pay | Admitting: Obstetrics and Gynecology

## 2019-05-21 ENCOUNTER — Other Ambulatory Visit (HOSPITAL_COMMUNITY)
Admission: RE | Admit: 2019-05-21 | Discharge: 2019-05-21 | Disposition: A | Discharge status: Home / Self Care | Payer: Medicaid Other | Source: Ambulatory Visit | Attending: Obstetrics and Gynecology | Admitting: Obstetrics and Gynecology

## 2019-05-21 ENCOUNTER — Other Ambulatory Visit: Payer: Self-pay

## 2019-05-21 ENCOUNTER — Ambulatory Visit (INDEPENDENT_AMBULATORY_CARE_PROVIDER_SITE_OTHER): Payer: Medicaid Other | Admitting: Obstetrics and Gynecology

## 2019-05-21 VITALS — BP 130/90 | Ht 67.0 in | Wt 292.0 lb

## 2019-05-21 DIAGNOSIS — Z113 Encounter for screening for infections with a predominantly sexual mode of transmission: Secondary | ICD-10-CM | POA: Diagnosis present

## 2019-05-21 DIAGNOSIS — Z202 Contact with and (suspected) exposure to infections with a predominantly sexual mode of transmission: Secondary | ICD-10-CM

## 2019-05-21 NOTE — Patient Instructions (Signed)
I value your feedback and entrusting us with your care. If you get a [facility] patient survey, I would appreciate you taking the time to let us know about your experience today. Thank you!  As of March 19, 2019, your lab results will be released to your MyChart immediately, before I even have a chance to see them. Please give me time to review them and contact you if there are any abnormalities. Thank you for your patience.  

## 2019-05-25 ENCOUNTER — Telehealth: Payer: Self-pay

## 2019-05-25 LAB — CERVICOVAGINAL ANCILLARY ONLY
Chlamydia: NEGATIVE
Comment: NEGATIVE
Comment: NEGATIVE
Comment: NORMAL
Neisseria Gonorrhea: NEGATIVE
Trichomonas: NEGATIVE

## 2019-05-25 NOTE — Telephone Encounter (Signed)
Pt calling for test results; not on MyChart.  [phone]

## 2019-05-26 NOTE — Telephone Encounter (Signed)
Called pt, no answer, [facility]. Results should now be showing through mychart.
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
## 2019-06-16 ENCOUNTER — Ambulatory Visit: Payer: Medicaid Other | Admitting: Obstetrics and Gynecology

## 2019-06-16 NOTE — Progress Notes (Deleted)
Center, [facility]   No chief complaint on file.   HPI:      [name] is a 29 y.o. [phone] who LMP was No LMP recorded., presents today for ***  Neg STD testing 05/21/19.  Past Medical History:  Diagnosis Date  . Anxiety   . Hypertension    ONLY DURING PREGNANCY  . PFO (patent foramen ovale)    a. 12/2014 Echo: EF 55-60%, mildly dil LA. PFO w/o significant shunting (reviewed by Dr. Rockey Situ 07/2018).  . Shortness of breath dyspnea     Past Surgical History:  Procedure Laterality Date  . CESAREAN SECTION  10/28/2008  . CESAREAN SECTION N/A 02/22/2015   Procedure: CESAREAN SECTION;  Surgeon: Gae Dry, MD;  Location: [facility] ORS;  Service: Obstetrics;  Laterality: N/A;  . CESAREAN SECTION  04/13/2010  . CESAREAN SECTION N/A 10/09/2018   Procedure: CESAREAN SECTION;  Surgeon: Homero Fellers, MD;  Location: [facility] ORS;  Service: Obstetrics;  Laterality: N/A;    Family History  Problem Relation Age of Onset  . Hypertension Mother   . Stroke Paternal Aunt   . Cancer Maternal Grandfather        Colon    Social History   Socioeconomic History  . Marital status: Single    Spouse name: Not on file  . Number of children: 4  . Years of education: Not on file  . Highest education level: Not on file  Occupational History  . Occupation: CNA  Tobacco Use  . Smoking status: Current Some Day Smoker    Packs/day: 0.25    Years: 2.00    Pack years: 0.50    Types: Cigarettes  . Smokeless tobacco: Never Used  Substance and Sexual Activity  . Alcohol use: No  . Drug use: No  . Sexual activity: Yes    Birth control/protection: None  Other Topics Concern  . Not on file  Social History Narrative  . Not on file   Social Determinants of Health   Financial Resource Strain: Low Risk   . Difficulty of Paying Living Expenses: Not hard at all  Food Insecurity: No Food Insecurity  . Worried About Charity fundraiser in the Last Year: Never  true  . Ran Out of Food in the Last Year: Never true  Transportation Needs: No Transportation Needs  . Lack of Transportation (Medical): No  . Lack of Transportation (Non-Medical): No  Physical Activity: Insufficiently Active  . Days of Exercise per Week: 4 days  . Minutes of Exercise per Session: 30 min  Stress: No Stress Concern Present  . Feeling of Stress : Not at all  Social Connections: Slightly Isolated  . Frequency of Communication with Friends and Family: Three times a week  . Frequency of Social Gatherings with Friends and Family: Twice a week  . Attends Religious Services: 1 to 4 times per year  . Active Member of Clubs or Organizations: No  . Attends Archivist Meetings: Never  . Marital Status: Living with partner  Intimate Partner Violence: Not At Risk  . Fear of Current or Ex-Partner: No  . Emotionally Abused: No  . Physically Abused: No  . Sexually Abused: No    Outpatient Medications Prior to Visit  Medication Sig Dispense Refill  . amLODipine (NORVASC) 5 MG tablet Take 1 tablet by mouth once daily 60 tablet 0  . aspirin 81 MG chewable tablet Chew 81 mg by mouth daily.  No facility-administered medications prior to visit.      ROS:  Review of Systems BREAST: No symptoms   OBJECTIVE:   Vitals:  There were no vitals taken for this visit.  Physical Exam  Results: No results found for this or any previous visit (from the past 24 hour(s)).   Assessment/Plan: No diagnosis found.    No orders of the defined types were placed in this encounter.     No follow-ups on file.  Jaxten Brosh B. Shaquita Fort, PA-C 06/16/2019 9:09 AM

## 2019-06-24 NOTE — Progress Notes (Signed)
Center, [facility]   Chief Complaint  Patient presents with  . Vaginal Pain    during intercourse x 1 week  . Vaginal Odor    itchiness, no discharge x 3 weeks  . Contraception    interested in OCP's    HPI:      [name] is a 29 y.o. [id number] who LMP was Patient's last menstrual period was 06/14/2019 (approximate)., presents today for vaginal odor with irritation and dysuria for a few wks. Has also had pelvic cramping and dyspareunia. Had yeast vag 12/20, treated with diflucan with sx relief. Neg STD testing 2/21. Hx of BV in past.  She is sex active, no condoms, no new partners since last STD testing 2/21. Would like BC. Did nexplanon in past and had chest pains towards the end of it, so had it removed. Did well for most of the time it was in, however. Pt with HTN. Doesn't think she can do pills daily.  Past Medical History:  Diagnosis Date  . Anxiety   . Hypertension   . PFO (patent foramen ovale)    a. 12/2014 Echo: EF 55-60%, mildly dil LA. PFO w/o significant shunting (reviewed by Dr. Mariah Milling 07/2018).  . Shortness of breath dyspnea     Past Surgical History:  Procedure Laterality Date  . CESAREAN SECTION  10/28/2008  . CESAREAN SECTION N/A 02/22/2015   Procedure: CESAREAN SECTION;  Surgeon: Nadara Mustard, MD;  Location: [facility] ORS;  Service: Obstetrics;  Laterality: N/A;  . CESAREAN SECTION  04/13/2010  . CESAREAN SECTION N/A 10/09/2018   Procedure: CESAREAN SECTION;  Surgeon: Natale Milch, MD;  Location: [facility] ORS;  Service: Obstetrics;  Laterality: N/A;    Family History  Problem Relation Age of Onset  . Hypertension Mother   . Stroke Paternal Aunt   . Cancer Maternal Grandfather        Colon    Social History   Socioeconomic History  . Marital status: Single    Spouse name: Not on file  . Number of children: 4  . Years of education: Not on file  . Highest education level: Not on file  Occupational History  .  Occupation: CNA  Tobacco Use  . Smoking status: Current Some Day Smoker    Packs/day: 0.25    Years: 2.00    Pack years: 0.50    Types: Cigarettes  . Smokeless tobacco: Never Used  Substance and Sexual Activity  . Alcohol use: No  . Drug use: No  . Sexual activity: Yes    Birth control/protection: None  Other Topics Concern  . Not on file  Social History Narrative  . Not on file   Social Determinants of Health   Financial Resource Strain: Low Risk   . Difficulty of Paying Living Expenses: Not hard at all  Food Insecurity: No Food Insecurity  . Worried About Programme researcher, broadcasting/film/video in the Last Year: Never true  . Ran Out of Food in the Last Year: Never true  Transportation Needs: No Transportation Needs  . Lack of Transportation (Medical): No  . Lack of Transportation (Non-Medical): No  Physical Activity: Insufficiently Active  . Days of Exercise per Week: 4 days  . Minutes of Exercise per Session: 30 min  Stress: No Stress Concern Present  . Feeling of Stress : Not at all  Social Connections: Slightly Isolated  . Frequency of Communication with Friends and Family: Three times a week  . Frequency  of Social Gatherings with Friends and Family: Twice a week  . Attends Religious Services: 1 to 4 times per year  . Active Member of Clubs or Organizations: No  . Attends Archivist Meetings: Never  . Marital Status: Living with partner  Intimate Partner Violence: Not At Risk  . Fear of Current or Ex-Partner: No  . Emotionally Abused: No  . Physically Abused: No  . Sexually Abused: No    Outpatient Medications Prior to Visit  Medication Sig Dispense Refill  . amLODipine (NORVASC) 5 MG tablet Take 1 tablet by mouth once daily 60 tablet 0  . aspirin 81 MG chewable tablet Chew 81 mg by mouth daily.     No facility-administered medications prior to visit.      ROS:  Review of Systems  Constitutional: Negative for fever.  Gastrointestinal: Negative for blood in  stool, constipation, diarrhea, nausea and vomiting.  Genitourinary: Positive for dysuria and vaginal discharge. Negative for dyspareunia, flank pain, frequency, hematuria, urgency, vaginal bleeding and vaginal pain.  Musculoskeletal: Negative for back pain.  Skin: Negative for rash.  BREAST: No symptoms   OBJECTIVE:   Vitals:  BP 130/90   Ht 5\' 7"  (1.702 m)   Wt 294 lb (133.4 kg)   LMP 06/14/2019 (Approximate)   Breastfeeding No   BMI 46.05 kg/m   Physical Exam Vitals reviewed.  Constitutional:      Appearance: She is well-developed.  Pulmonary:     Effort: Pulmonary effort is normal.  Genitourinary:    General: Normal vulva.     Pubic Area: No rash.      Labia:        Right: No rash, tenderness or lesion.        Left: No rash, tenderness or lesion.      Vagina: Vaginal discharge present. No erythema or tenderness.     Cervix: Normal.     Uterus: Normal. Not enlarged and not tender.      Adnexa: Right adnexa normal and left adnexa normal.       Right: No mass or tenderness.         Left: No mass or tenderness.    Musculoskeletal:        General: Normal range of motion.     Cervical back: Normal range of motion.  Skin:    General: Skin is warm and dry.  Neurological:     General: No focal deficit present.     Mental Status: She is alert and oriented to person, place, and time.  Psychiatric:        Mood and Affect: Mood normal.        Behavior: Behavior normal.        Thought Content: Thought content normal.        Judgment: Judgment normal.     Results: Results for orders placed or performed in visit on 06/25/19 (from the past 24 hour(s))  POCT Wet Prep with KOH     Status: Abnormal   Collection Time: 06/25/19  9:55 AM  Result Value Ref Range   Trichomonas, UA Negative    Clue Cells Wet Prep HPF POC pos    Epithelial Wet Prep HPF POC     Yeast Wet Prep HPF POC neg    Bacteria Wet Prep HPF POC     RBC Wet Prep HPF POC     WBC Wet Prep HPF POC     KOH Prep  POC Positive (A) Negative  Assessment/Plan: Bacterial vaginosis - Plan: POCT Wet Prep with KOH, metroNIDAZOLE (FLAGYL) 500 MG tablet; Pos sx/wet prep. Rx flagyl. No EtOH. F/u prn, See if dyspareunia and dysuria improve with tx (neg exam).   Encounter for initial prescription of implantable subdermal contraceptive--Discussed prog only options. Pt wants nexplanon again. RTO with menses for nexplanon insertion. Condoms.    Meds ordered this encounter  Medications  . metroNIDAZOLE (FLAGYL) 500 MG tablet    Sig: Take 1 tablet (500 mg total) by mouth 2 (two) times daily for 7 days.    Dispense:  14 tablet    Refill:  0    Order Specific Question:   Supervising Provider    Answer:   Nadara Mustard [[id number]]      Return if symptoms worsen or fail to improve.  Roena Sassaman B. Sutter Ahlgren, PA-C 06/25/2019 9:57 AM

## 2019-06-25 ENCOUNTER — Encounter: Payer: Self-pay | Admitting: Obstetrics and Gynecology

## 2019-06-25 ENCOUNTER — Ambulatory Visit (INDEPENDENT_AMBULATORY_CARE_PROVIDER_SITE_OTHER): Payer: Medicaid Other | Admitting: Obstetrics and Gynecology

## 2019-06-25 ENCOUNTER — Other Ambulatory Visit: Payer: Self-pay

## 2019-06-25 VITALS — BP 130/90 | Ht 67.0 in | Wt 294.0 lb

## 2019-06-25 DIAGNOSIS — N760 Acute vaginitis: Secondary | ICD-10-CM

## 2019-06-25 DIAGNOSIS — Z3009 Encounter for other general counseling and advice on contraception: Secondary | ICD-10-CM | POA: Diagnosis not present

## 2019-06-25 DIAGNOSIS — Z30017 Encounter for initial prescription of implantable subdermal contraceptive: Secondary | ICD-10-CM

## 2019-06-25 DIAGNOSIS — B9689 Other specified bacterial agents as the cause of diseases classified elsewhere: Secondary | ICD-10-CM | POA: Diagnosis not present

## 2019-06-25 LAB — POCT WET PREP WITH KOH
Clue Cells Wet Prep HPF POC: POSITIVE
KOH Prep POC: POSITIVE — AB
Trichomonas, UA: NEGATIVE
Yeast Wet Prep HPF POC: NEGATIVE

## 2019-06-25 MED ORDER — METRONIDAZOLE 500 MG PO TABS: 500.0000 mg | ORAL_TABLET | Freq: Two times a day (BID) | ORAL | 0 refills | Status: AC

## 2019-06-25 NOTE — Patient Instructions (Signed)
I value your feedback and entrusting us with your care. If you get a [facility] patient survey, I would appreciate you taking the time to let us know about your experience today. Thank you!  As of March 19, 2019, your lab results will be released to your MyChart immediately, before I even have a chance to see them. Please give me time to review them and contact you if there are any abnormalities. Thank you for your patience.  

## 2019-06-28 ENCOUNTER — Encounter: Payer: Self-pay | Admitting: Medical Oncology

## 2019-06-28 ENCOUNTER — Other Ambulatory Visit: Payer: Self-pay

## 2019-06-28 DIAGNOSIS — Z7982 Long term (current) use of aspirin: Secondary | ICD-10-CM | POA: Insufficient documentation

## 2019-06-28 DIAGNOSIS — Z79899 Other long term (current) drug therapy: Secondary | ICD-10-CM | POA: Diagnosis not present

## 2019-06-28 DIAGNOSIS — F17210 Nicotine dependence, cigarettes, uncomplicated: Secondary | ICD-10-CM | POA: Diagnosis not present

## 2019-06-28 DIAGNOSIS — H5712 Ocular pain, left eye: Secondary | ICD-10-CM | POA: Diagnosis present

## 2019-06-28 DIAGNOSIS — H1032 Unspecified acute conjunctivitis, left eye: Secondary | ICD-10-CM | POA: Insufficient documentation

## 2019-06-28 DIAGNOSIS — I10 Essential (primary) hypertension: Secondary | ICD-10-CM | POA: Insufficient documentation

## 2019-06-28 NOTE — ED Triage Notes (Signed)
Pt reports since yesterday she has been having left eye irritation, painful to light. Eye appears red and watery.

## 2019-06-29 ENCOUNTER — Emergency Department
Admission: EM | Admit: 2019-06-29 | Discharge: 2019-06-29 | Disposition: A | Discharge status: Home / Self Care | Payer: Medicaid Other | Attending: Emergency Medicine | Admitting: Emergency Medicine

## 2019-06-29 DIAGNOSIS — H1032 Unspecified acute conjunctivitis, left eye: Secondary | ICD-10-CM

## 2019-06-29 MED ORDER — TETRACAINE HCL 0.5 % OP SOLN
2.0000 [drp] | Freq: Once | OPHTHALMIC | Status: AC
  Administered 2019-06-29: 2 [drp] via OPHTHALMIC
  Filled 2019-06-29: qty 4

## 2019-06-29 MED ORDER — POLYMYXIN B-TRIMETHOPRIM 10000-0.1 UNIT/ML-% OP SOLN
2.0000 [drp] | OPHTHALMIC | Status: AC
  Administered 2019-06-29: 2 [drp] via OPHTHALMIC
  Filled 2019-06-29: qty 10

## 2019-06-29 MED ORDER — POLYMYXIN B-TRIMETHOPRIM 10000-0.1 UNIT/ML-% OP SOLN: OPHTHALMIC | 0 refills | Status: DC

## 2019-06-29 MED ORDER — FLUORESCEIN SODIUM 1 MG OP STRP
1.0000 | ORAL_STRIP | Freq: Once | OPHTHALMIC | Status: AC
  Administered 2019-06-29: 1 via OPHTHALMIC
  Filled 2019-06-29: qty 1
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
## 2019-06-29 NOTE — Discharge Instructions (Addendum)
You have an eye infection called conjunctivitis.  This can be caused by a viral or bacterial infection, but we treat with antibiotics either way to be safe.  Please use the provided antibiotics or fill the provided prescription and use as directed.  It is very important that you avoid touching your eye(s) because this condition is contagious; be sure to wash your hands with soap and water after touching your eye(s) and face.  Follow up as indicated on your instructions.  Return to the Emergency Department if your symptoms worsen in spite of treatment or if you develop new symptoms that concern you.  Use the provided eye drops by putting in 2 drops every 6 hours for the next 7 days.  If the provided bottle runs out too early, I provided a prescription for another bottle, but you only need to use the medication for a week unless otherwise directed by an eye doctor.

## 2019-06-29 NOTE — Telephone Encounter (Signed)
Patient decided mini pill per visit note.

## 2019-09-21 ENCOUNTER — Encounter: Payer: Self-pay | Admitting: Obstetrics & Gynecology

## 2019-09-21 ENCOUNTER — Ambulatory Visit (INDEPENDENT_AMBULATORY_CARE_PROVIDER_SITE_OTHER): Payer: Medicaid Other | Admitting: Obstetrics & Gynecology

## 2019-09-21 ENCOUNTER — Other Ambulatory Visit: Payer: Self-pay

## 2019-09-21 ENCOUNTER — Other Ambulatory Visit (HOSPITAL_COMMUNITY)
Admission: RE | Admit: 2019-09-21 | Discharge: 2019-09-21 | Disposition: A | Discharge status: Home / Self Care | Payer: Medicaid Other | Source: Ambulatory Visit | Attending: Obstetrics & Gynecology | Admitting: Obstetrics & Gynecology

## 2019-09-21 VITALS — BP 130/80 | Wt 284.0 lb

## 2019-09-21 DIAGNOSIS — Z124 Encounter for screening for malignant neoplasm of cervix: Secondary | ICD-10-CM

## 2019-09-21 DIAGNOSIS — Z369 Encounter for antenatal screening, unspecified: Secondary | ICD-10-CM

## 2019-09-21 DIAGNOSIS — Z3A01 Less than 8 weeks gestation of pregnancy: Secondary | ICD-10-CM

## 2019-09-21 DIAGNOSIS — Z113 Encounter for screening for infections with a predominantly sexual mode of transmission: Secondary | ICD-10-CM | POA: Diagnosis present

## 2019-09-21 DIAGNOSIS — Z131 Encounter for screening for diabetes mellitus: Secondary | ICD-10-CM

## 2019-09-21 DIAGNOSIS — O99210 Obesity complicating pregnancy, unspecified trimester: Secondary | ICD-10-CM

## 2019-09-21 DIAGNOSIS — O0991 Supervision of high risk pregnancy, unspecified, first trimester: Secondary | ICD-10-CM | POA: Insufficient documentation

## 2019-09-21 DIAGNOSIS — Z3201 Encounter for pregnancy test, result positive: Secondary | ICD-10-CM

## 2019-09-21 MED ORDER — AMLODIPINE BESYLATE 5 MG PO TABS: 5.0000 mg | ORAL_TABLET | Freq: Every day | ORAL | 9 refills | Status: DC

## 2019-09-21 NOTE — Progress Notes (Signed)
09/21/2019   Chief Complaint: Missed period  Transfer of Care Patient: no  History of Present Illness: [name] is a 29 y.o. [id number] [redacted]w[redacted]d based on Patient's last menstrual period was 08/07/2019 (exact date). with an Estimated Date of Delivery: 05/13/20, with the above CC.   Her periods were: regular periods every 28 days She was using no method when she conceived.  She has Positive signs or symptoms of nausea/vomiting of pregnancy. She has Negative signs or symptoms of miscarriage or preterm labor She identifies Negative Zika risk factors for her and her partner On any different medications around the time she conceived/early pregnancy: Yes - Norvasc for cHTN History of varicella: Yes   ROS: A 12-point review of systems was performed and negative, except as stated in the above HPI.  OBGYN History: As per HPI. OB History  Gravida Para Term Preterm AB Living  6 4 4   1 4   SAB TAB Ectopic Multiple Live Births    1   0 4    # Outcome Date GA Lbr Len/2nd Weight Sex Delivery Anes PTL Lv  6 Current           5 Term 10/09/18 [redacted]w[redacted]d  6 lb 1.4 oz (2.76 kg) F CS-LTranv Spinal  LIV  4 Term 02/22/15 [redacted]w[redacted]d  6 lb 8.1 oz (2.95 kg) F CS-LTranv Spinal  LIV  3 TAB 05/25/11          2 Term 04/13/10    F CS-LTranv   LIV  1 Term 10/28/08    F CS-LTranv   LIV    Any issues with any prior pregnancies: yes  CS x4  HTN w last pregnancy, still takes meds Any prior children are healthy, doing well, without any problems or issues: yes History of pap smears: Yes. Last pap smear 2019. Abnormal: no  History of STIs: No   Past Medical History: Past Medical History:  Diagnosis Date  . Anxiety   . Hypertension   . PFO (patent foramen ovale)    a. 12/2014 Echo: EF 55-60%, mildly dil LA. PFO w/o significant shunting (reviewed by Dr. 01/2015 07/2018).  . Shortness of breath dyspnea     Past Surgical History: Past Surgical History:  Procedure Laterality Date  . CESAREAN SECTION  10/28/2008  . CESAREAN  SECTION N/A 02/22/2015   Procedure: CESAREAN SECTION;  Surgeon: 02/24/2015, MD;  Location: [facility] ORS;  Service: Obstetrics;  Laterality: N/A;  . CESAREAN SECTION  04/13/2010  . CESAREAN SECTION N/A 10/09/2018   Procedure: CESAREAN SECTION;  Surgeon: 12/10/2018, MD;  Location: [facility] ORS;  Service: Obstetrics;  Laterality: N/A;    Family History:  Family History  Problem Relation Age of Onset  . Hypertension Mother   . Stroke Paternal Aunt   . Cancer Maternal Grandfather        Colon   She denies any female cancers, bleeding or blood clotting disorders.  She denies any history of mental retardation, birth defects or genetic disorders in her or the FOB's history  Social History:  Social History   Socioeconomic History  . Marital status: Single    Spouse name: Not on file  . Number of children: 4  . Years of education: Not on file  . Highest education level: Not on file  Occupational History  . Occupation: CNA  Tobacco Use  . Smoking status: Current Some Day Smoker    Packs/day: 0.25    Years: 2.00    Pack years:  0.50    Types: Cigarettes  . Smokeless tobacco: Never Used  Vaping Use  . Vaping Use: Never used  Substance and Sexual Activity  . Alcohol use: No  . Drug use: No  . Sexual activity: Yes    Birth control/protection: None  Other Topics Concern  . Not on file  Social History Narrative  . Not on file   Social Determinants of Health   Financial Resource Strain:   . Difficulty of Paying Living Expenses:   Food Insecurity:   . Worried About Programme researcher, broadcasting/film/video in the Last Year:   . Barista in the Last Year:   Transportation Needs:   . Freight forwarder (Medical):   Marland Kitchen Lack of Transportation (Non-Medical):   Physical Activity:   . Days of Exercise per Week:   . Minutes of Exercise per Session:   Stress:   . Feeling of Stress :   Social Connections:   . Frequency of Communication with Friends and Family:   . Frequency of Social  Gatherings with Friends and Family:   . Attends Religious Services:   . Active Member of Clubs or Organizations:   . Attends Banker Meetings:   Marland Kitchen Marital Status:   Intimate Partner Violence:   . Fear of Current or Ex-Partner:   . Emotionally Abused:   Marland Kitchen Physically Abused:   . Sexually Abused:    Any pets in the household: no  Allergy: No Known Allergies  Current Outpatient Medications:  Current Outpatient Medications:  .  amLODipine (NORVASC) 5 MG tablet, Take 1 tablet (5 mg total) by mouth daily., Disp: 30 tablet, Rfl: 9 .  aspirin 81 MG chewable tablet, Chew 81 mg by mouth daily., Disp: , Rfl:    Physical Exam:   BP 130/80   Wt 284 lb (128.8 kg)   LMP 08/07/2019 (Exact Date)   BMI 44.48 kg/m  Body mass index is 44.48 kg/m. Constitutional: Well nourished, well developed female in no acute distress.  Neck:  Supple, normal appearance, and no thyromegaly  Cardiovascular: S1, S2 normal, no murmur, rub or gallop, regular rate and rhythm Respiratory:  Clear to auscultation bilateral. Normal respiratory effort Abdomen: positive bowel sounds and no masses, hernias; diffusely non tender to palpation, non distended Breasts: breasts appear normal, no suspicious masses, no skin or nipple changes or axillary nodes. Neuro/Psych:  Normal mood and affect.  Skin:  Warm and dry.  Lymphatic:  No inguinal lymphadenopathy.   Pelvic exam: is not limited by body habitus EGBUS: within normal limits, Vagina: within normal limits and with no blood in the vault, Cervix: normal appearing cervix without discharge or lesions, closed/long/high, Uterus:  enlarged: 8 weeks, and Adnexa:  no mass, fullness, tenderness  Assessment: [name] is a 29 y.o. [id number] [redacted]w[redacted]d based on Patient's last menstrual period was 08/07/2019 (exact date). with an Estimated Date of Delivery: 05/13/20,  for prenatal care.  Plan:  1) Avoid alcoholic beverages. 2) Patient encouraged not to smoke.  3)  Discontinue the use of all non-medicinal drugs and chemicals.  4) Take prenatal vitamins daily.  5) Seatbelt use advised 6) Nutrition, food safety (fish, cheese advisories, and high nitrite foods) and exercise discussed. 7) Hospital and practice style delivering at [facility] discussed  8) Patient is asked about travel to areas at risk for the Zika virus, and counseled to avoid travel and exposure to mosquitoes or sexual partners who may have themselves been exposed to the virus. Testing is  discussed, and will be ordered as appropriate.  9) Childbirth classes at [facility] advised 10) Genetic Screening, such as with 1st Trimester Screening, cell free fetal DNA, AFP testing, and Ultrasound, as well as with amniocentesis and CVS as appropriate, is discussed with patient. She plans to have genetic testing this pregnancy. 11) cHTN, HROB discussed. Cont Norvasc for now. 12) [place] soon 13) CS and recent CS < 1 year ago as a risk factor discussed Needs CS 39 weeks 14) Obesity risk factors discussed.  Early glucola 10 weeks with NIPT testing.  Baby ASA daily. 15) TDaP UTD  Problem list reviewed and updated.  Barnett Applebaum, MD, [facility], [facility] 09/21/2019  8:41 AM

## 2019-09-21 NOTE — Patient Instructions (Signed)
First Trimester of Pregnancy The first trimester of pregnancy is from week 1 until the end of week 13 (months 1 through 3). A week after a sperm fertilizes an egg, the egg will implant on the wall of the uterus. This embryo will begin to develop into a baby. Genes from you and your partner will form the baby. The female genes will determine whether the baby will be a boy or a girl. At 6-8 weeks, the eyes and face will be formed, and the heartbeat can be seen on ultrasound. At the end of 12 weeks, all the baby's organs will be formed. Now that you are pregnant, you will want to do everything you can to have a healthy baby. Two of the most important things are to get good prenatal care and to follow your health care provider's instructions. Prenatal care is all the medical care you receive before the baby's birth. This care will help prevent, find, and treat any problems during the pregnancy and childbirth. Body changes during your first trimester Your body goes through many changes during pregnancy. The changes vary from woman to woman.  You may gain or lose a couple of pounds at first.  You may feel sick to your stomach (nauseous) and you may throw up (vomit). If the vomiting is uncontrollable, call your health care provider.  You may tire easily.  You may develop headaches that can be relieved by medicines. All medicines should be approved by your health care provider.  You may urinate more often. Painful urination may mean you have a bladder infection.  You may develop heartburn as a result of your pregnancy.  You may develop constipation because certain hormones are causing the muscles that push stool through your intestines to slow down.  You may develop hemorrhoids or swollen veins (varicose veins).  Your breasts may begin to grow larger and become tender. Your nipples may stick out more, and the tissue that surrounds them (areola) may become darker.  Your gums may bleed and may be  sensitive to brushing and flossing.  Dark spots or blotches (chloasma, mask of pregnancy) may develop on your face. This will likely fade after the baby is born.  Your menstrual periods will stop.  You may have a loss of appetite.  You may develop cravings for certain kinds of food.  You may have changes in your emotions from day to day, such as being excited to be pregnant or being concerned that something may go wrong with the pregnancy and baby.  You may have more vivid and strange dreams.  You may have changes in your hair. These can include thickening of your hair, rapid growth, and changes in texture. Some women also have hair loss during or after pregnancy, or hair that feels dry or thin. Your hair will most likely return to normal after your baby is born. What to expect at prenatal visits During a routine prenatal visit:  You will be weighed to make sure you and the baby are growing normally.  Your blood pressure will be taken.  Your abdomen will be measured to track your baby's growth.  The fetal heartbeat will be listened to between weeks 10 and 14 of your pregnancy.  Test results from any previous visits will be discussed. Your health care provider may ask you:  How you are feeling.  If you are feeling the baby move.  If you have had any abnormal symptoms, such as leaking fluid, bleeding, severe headaches, or abdominal   cramping.  If you are using any tobacco products, including cigarettes, chewing tobacco, and electronic cigarettes.  If you have any questions. Other tests that may be performed during your first trimester include:  Blood tests to find your blood type and to check for the presence of any previous infections. The tests will also be used to check for low iron levels (anemia) and protein on red blood cells (Rh antibodies). Depending on your risk factors, or if you previously had diabetes during pregnancy, you may have tests to check for high blood sugar  that affects pregnant women (gestational diabetes).  Urine tests to check for infections, diabetes, or protein in the urine.  An ultrasound to confirm the proper growth and development of the baby.  Fetal screens for spinal cord problems (spina bifida) and Down syndrome.  HIV (human immunodeficiency virus) testing. Routine prenatal testing includes screening for HIV, unless you choose not to have this test.  You may need other tests to make sure you and the baby are doing well. Follow these instructions at home: Medicines  Follow your health care provider's instructions regarding medicine use. Specific medicines may be either safe or unsafe to take during pregnancy.  Take a prenatal vitamin that contains at least 600 micrograms (mcg) of folic acid.  If you develop constipation, try taking a stool softener if your health care provider approves. Eating and drinking   Eat a balanced diet that includes fresh fruits and vegetables, whole grains, good sources of protein such as meat, eggs, or tofu, and low-fat dairy. Your health care provider will help you determine the amount of weight gain that is right for you.  Avoid raw meat and uncooked cheese. These carry germs that can cause birth defects in the baby.  Eating four or five small meals rather than three large meals a day may help relieve nausea and vomiting. If you start to feel nauseous, eating a few soda crackers can be helpful. Drinking liquids between meals, instead of during meals, also seems to help ease nausea and vomiting.  Limit foods that are high in fat and processed sugars, such as fried and sweet foods.  To prevent constipation: ? Eat foods that are high in fiber, such as fresh fruits and vegetables, whole grains, and beans. ? Drink enough fluid to keep your urine clear or pale yellow. Activity  Exercise only as directed by your health care provider. Most women can continue their usual exercise routine during  pregnancy. Try to exercise for 30 minutes at least 5 days a week. Exercising will help you: ? Control your weight. ? Stay in shape. ? Be prepared for labor and delivery.  Experiencing pain or cramping in the lower abdomen or lower back is a good sign that you should stop exercising. Check with your health care provider before continuing with normal exercises.  Try to avoid standing for long periods of time. Move your legs often if you must stand in one place for a long time.  Avoid heavy lifting.  Wear low-heeled shoes and practice good posture.  You may continue to have sex unless your health care provider tells you not to. Relieving pain and discomfort  Wear a good support bra to relieve breast tenderness.  Take warm sitz baths to soothe any pain or discomfort caused by hemorrhoids. Use hemorrhoid cream if your health care provider approves.  Rest with your legs elevated if you have leg cramps or low back pain.  If you develop varicose veins in   your legs, wear support hose. Elevate your feet for 15 minutes, 3-4 times a day. Limit salt in your diet. Prenatal care  Schedule your prenatal visits by the twelfth week of pregnancy. They are usually scheduled monthly at first, then more often in the last 2 months before delivery.  Write down your questions. Take them to your prenatal visits.  Keep all your prenatal visits as told by your health care provider. This is important. Safety  Wear your seat belt at all times when driving.  Make a list of emergency phone numbers, including numbers for family, friends, the hospital, and police and fire departments. General instructions  Ask your health care provider for a referral to a local prenatal education class. Begin classes no later than the beginning of month 6 of your pregnancy.  Ask for help if you have counseling or nutritional needs during pregnancy. Your health care provider can offer advice or refer you to specialists for help  with various needs.  Do not use hot tubs, steam rooms, or saunas.  Do not douche or use tampons or scented sanitary pads.  Do not cross your legs for long periods of time.  Avoid cat litter boxes and soil used by cats. These carry germs that can cause birth defects in the baby and possibly loss of the fetus by miscarriage or stillbirth.  Avoid all smoking, herbs, alcohol, and medicines not prescribed by your health care provider. Chemicals in these products affect the formation and growth of the baby.  Do not use any products that contain nicotine or tobacco, such as cigarettes and e-cigarettes. If you need help quitting, ask your health care provider. You may receive counseling support and other resources to help you quit.  Schedule a dentist appointment. At home, brush your teeth with a soft toothbrush and be gentle when you floss. Contact a health care provider if:  You have dizziness.  You have mild pelvic cramps, pelvic pressure, or nagging pain in the abdominal area.  You have persistent nausea, vomiting, or diarrhea.  You have a bad smelling vaginal discharge.  You have pain when you urinate.  You notice increased swelling in your face, hands, legs, or ankles.  You are exposed to fifth disease or chickenpox.  You are exposed to German measles (rubella) and have never had it. Get help right away if:  You have a fever.  You are leaking fluid from your vagina.  You have spotting or bleeding from your vagina.  You have severe abdominal cramping or pain.  You have rapid weight gain or loss.  You vomit blood or material that looks like coffee grounds.  You develop a severe headache.  You have shortness of breath.  You have any kind of trauma, such as from a fall or a car accident. Summary  The first trimester of pregnancy is from week 1 until the end of week 13 (months 1 through 3).  Your body goes through many changes during pregnancy. The changes vary from  woman to woman.  You will have routine prenatal visits. During those visits, your health care provider will examine you, discuss any test results you may have, and talk with you about how you are feeling. This information is not intended to replace advice given to you by your health care provider. Make sure you discuss any questions you have with your health care provider. Document Revised: 03/08/2017 Document Reviewed: 03/07/2016 Elsevier Patient Education  2020 Elsevier Inc.  

## 2019-09-22 LAB — CERVICOVAGINAL ANCILLARY ONLY
Bacterial Vaginitis (gardnerella): POSITIVE — AB
Candida Glabrata: NEGATIVE
Candida Vaginitis: POSITIVE — AB
Chlamydia: POSITIVE — AB
Comment: NEGATIVE
Comment: NEGATIVE
Comment: NEGATIVE
Comment: NEGATIVE
Comment: NEGATIVE
Comment: NORMAL
Neisseria Gonorrhea: NEGATIVE
Trichomonas: NEGATIVE

## 2019-09-22 LAB — RPR+RH+ABO+RUB AB+AB SCR+CB...
Antibody Screen: NEGATIVE
HIV Screen 4th Generation wRfx: NONREACTIVE
Hematocrit: 42.5 % (ref 34.0–46.6)
Hemoglobin: 13.5 g/dL (ref 11.1–15.9)
Hepatitis B Surface Ag: NEGATIVE
MCH: 26.3 pg — ABNORMAL LOW (ref 26.6–33.0)
MCHC: 31.8 g/dL (ref 31.5–35.7)
MCV: 83 fL (ref 79–97)
Platelets: 354 10*3/uL (ref 150–450)
RBC: 5.13 x10E6/uL (ref 3.77–5.28)
RDW: 14.3 % (ref 11.7–15.4)
RPR Ser Ql: NONREACTIVE
Rh Factor: POSITIVE
Rubella Antibodies, IGG: <0.9 index — ABNORMAL LOW (ref >0.99)
Varicella zoster IgG: 629 index (ref >165)
WBC: 10.8 10*3/uL (ref 3.4–10.8)

## 2019-09-23 ENCOUNTER — Telehealth: Payer: Self-pay

## 2019-09-23 ENCOUNTER — Other Ambulatory Visit: Payer: Self-pay | Admitting: Obstetrics & Gynecology

## 2019-09-23 LAB — URINE CULTURE

## 2019-09-23 MED ORDER — AZITHROMYCIN 500 MG PO TABS: 1000.0000 mg | ORAL_TABLET | Freq: Once | ORAL | 0 refills | Status: AC

## 2019-09-23 MED ORDER — FLUCONAZOLE 150 MG PO TABS: 150.0000 mg | ORAL_TABLET | Freq: Once | ORAL | 3 refills | Status: DC

## 2019-09-23 NOTE — Telephone Encounter (Signed)
Pt calling; something on MyChart is positive.  Please call.  [phone]

## 2019-09-23 NOTE — Progress Notes (Signed)
Treating Chlamydia and Candida vaginitis now Plan TOC 1 month Tx BV second trimester  Annamarie Major, MD, [facility], [facility] 09/23/2019  11:29 AM

## 2019-09-24 LAB — CYTOLOGY - PAP
Comment: NEGATIVE
Diagnosis: UNDETERMINED — AB
High risk HPV: POSITIVE — AB

## 2019-09-25 ENCOUNTER — Telehealth: Payer: Self-pay

## 2019-09-25 ENCOUNTER — Other Ambulatory Visit: Payer: Self-pay | Admitting: Advanced Practice Midwife

## 2019-09-25 DIAGNOSIS — O219 Vomiting of pregnancy, unspecified: Secondary | ICD-10-CM

## 2019-09-25 MED ORDER — ONDANSETRON 4 MG PO TBDP: 4.0000 mg | ORAL_TABLET | Freq: Four times a day (QID) | ORAL | 0 refills | Status: DC | PRN

## 2019-09-25 NOTE — Telephone Encounter (Signed)
I sent Rx zofran and also a MyChart message. Please call her also to let her know. Thanks

## 2019-09-25 NOTE — Progress Notes (Unsigned)
Rx zofran sent for patient and MyChart message sent.

## 2019-09-25 NOTE — Telephone Encounter (Signed)
Pt aware.

## 2019-09-25 NOTE — Telephone Encounter (Signed)
Mailbox is full.

## 2019-09-25 NOTE — Telephone Encounter (Signed)
Pt calling for pills for nausea.  [phone]

## 2019-09-27 ENCOUNTER — Other Ambulatory Visit: Payer: Self-pay

## 2019-09-27 ENCOUNTER — Emergency Department
Admission: EM | Admit: 2019-09-27 | Discharge: 2019-09-27 | Disposition: A | Discharge status: Home / Self Care | Payer: Medicaid Other | Attending: Emergency Medicine | Admitting: Emergency Medicine

## 2019-09-27 ENCOUNTER — Emergency Department: Payer: Medicaid Other

## 2019-09-27 ENCOUNTER — Encounter: Payer: Self-pay | Admitting: Emergency Medicine

## 2019-09-27 DIAGNOSIS — O200 Threatened abortion: Secondary | ICD-10-CM | POA: Diagnosis not present

## 2019-09-27 DIAGNOSIS — Z3A01 Less than 8 weeks gestation of pregnancy: Secondary | ICD-10-CM | POA: Diagnosis not present

## 2019-09-27 DIAGNOSIS — O209 Hemorrhage in early pregnancy, unspecified: Secondary | ICD-10-CM | POA: Diagnosis present

## 2019-09-27 DIAGNOSIS — R0602 Shortness of breath: Secondary | ICD-10-CM | POA: Insufficient documentation

## 2019-09-27 LAB — CBC
HCT: 40.5 % (ref 36.0–46.0)
Hemoglobin: 13.3 g/dL (ref 12.0–15.0)
MCH: 26.5 pg (ref 26.0–34.0)
MCHC: 32.8 g/dL (ref 30.0–36.0)
MCV: 80.8 fL (ref 80.0–100.0)
Platelets: 365 10*3/uL (ref 150–400)
RBC: 5.01 MIL/uL (ref 3.87–5.11)
RDW: 14.5 % (ref 11.5–15.5)
WBC: 10.5 10*3/uL (ref 4.0–10.5)
nRBC: 0 % (ref 0.0–0.2)

## 2019-09-27 LAB — ABO/RH: ABO/RH(D): O POS

## 2019-09-27 LAB — POCT PREGNANCY, URINE: Preg Test, Ur: POSITIVE — AB

## 2019-09-27 LAB — BRAIN NATRIURETIC PEPTIDE: B Natriuretic Peptide: 24.2 pg/mL (ref 0.0–100.0)

## 2019-09-27 LAB — HCG, QUANTITATIVE, PREGNANCY: hCG, Beta Chain, Quant, S: 25098 m[IU]/mL — ABNORMAL HIGH (ref <5)

## 2019-09-27 NOTE — ED Provider Notes (Signed)
ER Provider Note       Time seen: 11:02 AM    I have reviewed the vital signs and the nursing notes.  HISTORY   Chief Complaint Vaginal Bleeding    HPI Ashley Howell is a 29 y.o. female with a history of anxiety, hypertension, dyspnea who presents today for vaginal bleeding in early pregnancy. Patient states she is [redacted] weeks pregnant started having light vaginal bleeding yesterday, has had some abdominal cramping. Discomfort is 9 out of 10.  Past Medical History:  Diagnosis Date  . Anxiety   . Hypertension   . PFO (patent foramen ovale)    a. 12/2014 Echo: EF 55-60%, mildly dil LA. PFO w/o significant shunting (reviewed by Dr. Rockey Situ 07/2018).  . Shortness of breath dyspnea     Past Surgical History:  Procedure Laterality Date  . CESAREAN SECTION  10/28/2008  . CESAREAN SECTION N/A 02/22/2015   Procedure: CESAREAN SECTION;  Surgeon: Gae Dry, MD;  Location: [facility] ORS;  Service: Obstetrics;  Laterality: N/A;  . CESAREAN SECTION  04/13/2010  . CESAREAN SECTION N/A 10/09/2018   Procedure: CESAREAN SECTION;  Surgeon: Homero Fellers, MD;  Location: [facility] ORS;  Service: Obstetrics;  Laterality: N/A;    Allergies Patient has no known allergies.   Review of Systems Constitutional: Negative for fever. Cardiovascular: Negative for chest pain. Respiratory: Negative for shortness of breath. Gastrointestinal: Negative for abdominal pain, vomiting and diarrhea. Genitourinary: Positive for pelvic cramping and vaginal bleeding Musculoskeletal: Negative for back pain. Skin: Negative for rash. Neurological: Negative for headaches, focal weakness or numbness.  All systems negative/normal/unremarkable except as stated in the HPI  ____________________________________________   PHYSICAL EXAM:  VITAL SIGNS: Vitals:   09/27/19 0640  BP: 139/79  Pulse: 93  Resp: 18  SpO2: 98%    Constitutional: Alert and oriented. Well appearing and in no distress. Eyes:  Conjunctivae are normal. Normal extraocular movements. Cardiovascular: Normal rate, regular rhythm. No murmurs, rubs, or gallops. Respiratory: Normal respiratory effort without tachypnea nor retractions. Breath sounds are clear and equal bilaterally. No wheezes/rales/rhonchi. Gastrointestinal: Soft and nontender. Normal bowel sounds Musculoskeletal: Nontender with normal range of motion in extremities. No lower extremity tenderness nor edema. Neurologic:  Normal speech and language. No gross focal neurologic deficits are appreciated.  Skin:  Skin is warm, dry and intact. No rash noted. ____________________________________________   LABS (pertinent positives/negatives)  Labs Reviewed  HCG, QUANTITATIVE, PREGNANCY - Abnormal; Notable for the following components:      Result Value   hCG, Beta Chain, Quant, S 25,098 (*)    All other components within normal limits  POCT PREGNANCY, URINE - Abnormal; Notable for the following components:   Preg Test, Ur POSITIVE (*)    All other components within normal limits  CBC  BRAIN NATRIURETIC PEPTIDE  ABO/RH    RADIOLOGY  Images were viewed by me Pregnancy ultrasound IMPRESSION: 1. Single intrauterine gestation measuring 5 weeks 6 days by crown-rump length. 2. Cardiac activity is visualized. However, heart rate measurement was unable to be obtained secondary to very early gestational age. Short-term follow-up ultrasound is recommended to ensure viability.  DIFFERENTIAL DIAGNOSIS  Threatened miscarriage, miscarriage, normal pregnancy, anemia  ASSESSMENT AND PLAN  Threatened miscarriage   Plan: The patient had presented for vaginal bleeding in early pregnancy. Patient's labs did reveal a quant of 25,000. Ultrasound findings revealed intrauterine gestation measuring 5 weeks and 6 days with cardiac activity.  She will be encouraged to have close outpatient follow-up with her  OB/GYN doctor.  Daryel November MD    Note: This note was  generated in part or whole with voice recognition software. Voice recognition is usually quite accurate but there are transcription errors that can and very often do occur. I apologize for any typographical errors that were not detected and corrected.     Emily Filbert, MD 09/27/19 1200

## 2019-09-27 NOTE — ED Triage Notes (Signed)
Patient states that she is [redacted] weeks pregnant and started having light vaginal bleeding yesterday. Patient states that she has had some abdominal cramping.

## 2019-09-27 NOTE — ED Notes (Signed)
Pt observed walking to the parking lot. Pt ambulating without difficulty or distress.

## 2019-09-28 ENCOUNTER — Ambulatory Visit (INDEPENDENT_AMBULATORY_CARE_PROVIDER_SITE_OTHER): Payer: Medicaid Other

## 2019-09-28 ENCOUNTER — Ambulatory Visit (INDEPENDENT_AMBULATORY_CARE_PROVIDER_SITE_OTHER): Payer: Medicaid Other | Admitting: Obstetrics

## 2019-09-28 ENCOUNTER — Other Ambulatory Visit: Payer: Self-pay | Admitting: Obstetrics

## 2019-09-28 VITALS — BP 138/80 | Wt 284.0 lb

## 2019-09-28 DIAGNOSIS — O209 Hemorrhage in early pregnancy, unspecified: Secondary | ICD-10-CM

## 2019-09-28 DIAGNOSIS — Z3A01 Less than 8 weeks gestation of pregnancy: Secondary | ICD-10-CM

## 2019-09-28 DIAGNOSIS — O0990 Supervision of high risk pregnancy, unspecified, unspecified trimester: Secondary | ICD-10-CM

## 2019-09-28 NOTE — Progress Notes (Signed)
Work in at 7 weeks 3 days for vaginal bleeding. She was seen yesterday at the ED for this complaint. Reports some cramping and continued spotting. At the ED, the ultrsound did not detect FHTS, although a pregnancy was seen. Vegina exam today shows scant red blood, no clots noted. Patient has been treated for Chlamydia this pregnancy, and needs treatment for BV as well. She reports her partner has been treated for the CT. Ultrasound ordered for today and a quantitative Hcg is drawn. She understands that she would need to have a repeat quant on Wednesday.  The ultrasound today reveals same as yesterdays- a single IUP seen, with FHT too difficult to measure as the pregnancy is still early. We will review the quantitative HCG now and the repeat in two days. She will RTC on Wednesday for the repeat and an ROB. Mirna Mires, CNM  09/28/2019 11:52 AM

## 2019-09-29 ENCOUNTER — Telehealth: Payer: Self-pay

## 2019-09-29 ENCOUNTER — Encounter: Payer: Medicaid Other | Admitting: Obstetrics and Gynecology

## 2019-09-29 ENCOUNTER — Encounter: Payer: Self-pay | Admitting: Obstetrics

## 2019-09-29 LAB — BETA HCG QUANT (REF LAB): hCG Quant: 19088 m[IU]/mL

## 2019-09-29 NOTE — Telephone Encounter (Signed)
Called this patinet and explained again that the level of HCG indicates she is pregnant. As she has been bleeding, we need the second quantitative test drawn tomorrow as a comparison. She verbalized understanding.

## 2019-09-29 NOTE — Telephone Encounter (Signed)
Pt calling; wants to know the meaning of her HCG result from yesterday.  [phone]

## 2019-09-30 ENCOUNTER — Other Ambulatory Visit: Payer: Medicaid Other

## 2019-09-30 NOTE — Telephone Encounter (Signed)
Pt calling; returning missed call; aware we need her to have this lab drawn so we can compare it to the last result.  Pt states she miscarried last night.  Adv we still needed to be sure it goes down to zero.  Pt states she is in too much pain to come in today.  Tx'd to SP to schedule for tomorrow.

## 2019-09-30 NOTE — Telephone Encounter (Signed)
Pt calling;; doesn't think she needs to keep lab appt today b/c she miscarried; has questions. [phone]  Mailbox is full

## 2019-10-01 ENCOUNTER — Other Ambulatory Visit: Payer: Medicaid Other

## 2019-10-01 ENCOUNTER — Other Ambulatory Visit: Payer: Self-pay | Admitting: Certified Nurse Midwife

## 2019-10-01 ENCOUNTER — Other Ambulatory Visit: Payer: Self-pay

## 2019-10-01 DIAGNOSIS — O200 Threatened abortion: Secondary | ICD-10-CM

## 2019-10-02 ENCOUNTER — Encounter: Payer: Self-pay | Admitting: Certified Nurse Midwife

## 2019-10-02 LAB — BETA HCG QUANT (REF LAB): hCG Quant: 2832 m[IU]/mL

## 2019-10-05 ENCOUNTER — Other Ambulatory Visit: Payer: Self-pay

## 2019-10-05 ENCOUNTER — Ambulatory Visit (INDEPENDENT_AMBULATORY_CARE_PROVIDER_SITE_OTHER): Payer: Medicaid Other

## 2019-10-05 ENCOUNTER — Ambulatory Visit (INDEPENDENT_AMBULATORY_CARE_PROVIDER_SITE_OTHER): Payer: Medicaid Other | Admitting: Obstetrics & Gynecology

## 2019-10-05 ENCOUNTER — Encounter: Payer: Self-pay | Admitting: Obstetrics & Gynecology

## 2019-10-05 VITALS — BP 142/84 | Wt 286.0 lb

## 2019-10-05 DIAGNOSIS — Z3A01 Less than 8 weeks gestation of pregnancy: Secondary | ICD-10-CM | POA: Diagnosis not present

## 2019-10-05 DIAGNOSIS — B373 Candidiasis of vulva and vagina: Secondary | ICD-10-CM

## 2019-10-05 DIAGNOSIS — O99210 Obesity complicating pregnancy, unspecified trimester: Secondary | ICD-10-CM

## 2019-10-05 DIAGNOSIS — B3731 Acute candidiasis of vulva and vagina: Secondary | ICD-10-CM

## 2019-10-05 DIAGNOSIS — O039 Complete or unspecified spontaneous abortion without complication: Secondary | ICD-10-CM | POA: Diagnosis not present

## 2019-10-05 MED ORDER — TERCONAZOLE 0.4 % VA CREA
1.0000 | TOPICAL_CREAM | Freq: Every day | VAGINAL | 0 refills | Status: DC
Start: 2019-10-05 — End: 2020-05-06

## 2019-10-05 NOTE — Patient Instructions (Signed)
Terconazole vaginal cream What is this medicine? TERCONAZOLE (ter KON a zole) is an antifungal medicine. It is used to treat yeast infections of the vagina. This medicine may be used for other purposes; ask your health care provider or pharmacist if you have questions. COMMON BRAND NAME(S): Terazol 3, Terazol 7, Zazole What should I tell my health care provider before I take this medicine? They need to know if you have any of these conditions:  an unusual or allergic reaction to terconazole, other antifungals, other medicines, foods, dyes or preservatives  pregnant or trying to get pregnant  breast-feeding How should I use this medicine? This medicine is only for use in the vagina. Do not take by mouth. Wash hands before and after use. Read package directions carefully before using. Use this medicine at bedtime, unless otherwise directed by your doctor or health care professional. Screw the applicator onto the end of the tube and squeeze the tube to fill the applicator. Remove the applicator from the tube. Lie on your back. Gently insert the applicator tip high in the vagina and push the plunger to release the cream into the vagina. Gently remove the applicator. Wash the applicator well with warm water and soap. Use at regular intervals. Do not get this medicine in your eyes. If you do, rinse out with plenty of cool tap water. Finish the full course prescribed by your doctor or health care professional even if you think your condition is better. Do not stop using this medicine if your menstrual period starts during the time of treatment. Talk to your pediatrician regarding the use of this medicine in children. Special care may be needed. Overdosage: If you think you have taken too much of this medicine contact a poison control center or emergency room at once. NOTE: This medicine is only for you. Do not share this medicine with others. What if I miss a dose? If you miss a dose, use it as soon as  you can. If it is almost time for your next dose, use only that dose. Do not use double or extra doses. What may interact with this medicine? Interactions are not expected. Do not use any other vaginal products without telling your doctor or health care professional. This list may not describe all possible interactions. Give your health care provider a list of all the medicines, herbs, non-prescription drugs, or dietary supplements you use. Also tell them if you smoke, drink alcohol, or use illegal drugs. Some items may interact with your medicine. What should I watch for while using this medicine? Tell your doctor or health care professional if your symptoms do not start to get better within a few days. It is better not to have sex until you have finished your treatment. If you have sex, your partner should use a condom during sex to help prevent transfer of the infection. Your sexual partner may also need treatment. Vaginal medicines usually will come out of the vagina during treatment. To keep the medicine from getting on your clothing, wear a mini-pad or sanitary napkin. The use of tampons is not recommended since they may soak up the medicine. To help clear up the infection, wear freshly washed cotton, not synthetic, underwear. What side effects may I notice from receiving this medicine? Side effects that you should report to your doctor or health care professional as soon as possible:  painful or difficult urination  vaginal pain Side effects that usually do not require medical attention (report to your doctor  or health care professional if they continue or are bothersome):  headache  menstrual pain  stomach upset  vaginal irritation, itching or burning This list may not describe all possible side effects. Call your doctor for medical advice about side effects. You may report side effects to FDA at 1-800-FDA-1088. Where should I keep my medicine? Keep out of the reach of  children. Store at room temperature between 15 and 30 degrees C (59 and 86 degrees F). Throw away any unused medicine after the expiration date. NOTE: This sheet is a summary. It may not cover all possible information. If you have questions about this medicine, talk to your doctor, pharmacist, or health care provider.  2020 Elsevier/Gold Standard (2007-12-10 13:51:27)

## 2019-10-05 NOTE — Progress Notes (Signed)
  HPI: Pt has had vaginal bleeding after noting a drop in beta hCG levels last week.  [place] reviewed today and no longer visualizes fetal pole ot gest sac.  Pt denoies nausea.  Pt expresses concern over treatments for chlamydia and yeast infection, and their possible etiology with her miscarriage.  Pt desires pregnancy attempt again soon.  Pt was treated for chlamydia last week, and is in need for test of cure prior to trying for pregnancy again.  PMHx: She  has a past medical history of Anxiety, Hypertension, PFO (patent foramen ovale), and Shortness of breath dyspnea. Also,  has a past surgical history that includes Cesarean section (10/28/2008); Cesarean section (N/A, 02/22/2015); Cesarean section (04/13/2010); and Cesarean section (N/A, 10/09/2018)., family history includes Cancer in her maternal grandfather; Hypertension in her mother; Stroke in her paternal aunt.,  reports that she has been smoking cigarettes. She has a 0.50 pack-year smoking history. She has never used smokeless tobacco. She reports that she does not drink alcohol and does not use drugs.  She has a current medication list which includes the following prescription(s): amlodipine, aspirin, ondansetron, and terconazole. Also, has No Known Allergies.  Review of Systems  All other systems reviewed and are negative.   Objective: BP (!) 142/84   Wt 286 lb (129.7 kg)   LMP 08/07/2019 (Exact Date)   BMI 44.79 kg/m   Physical examination Constitutional NAD, Conversant  Skin No rashes, lesions or ulceration.   Extremities: Moves all appropriately.  Normal ROM for age. No lymphadenopathy.  Neuro: Grossly intact  Psych: Oriented to PPT.  Normal mood. Normal affect.   US OB LESS THAN 14 WEEKS WITH OB TRANSVAGINAL  Result Date: 10/05/2019 Patient Name: Ashley Howell DOB: 03-Jun-1990 MRN: [id number] ULTRASOUND REPORT Location: [facility] Date of Service: 10/05/2019 Indications:Threatened Ashley, bleeding Findings: No  intra/extrauterine pregnancy is seen today.  The endometrial lining measures 11.8 mm. Right Ovary is normal in appearance. Left Ovary is not visualized. Corpus luteal cyst:  is not visualized Survey of the adnexa demonstrates no adnexal masses. There is no free peritoneal fluid in the cul de sac. Impression: 1. No intra/extrauterine pregnancy is seen today. 2. The endometrium is irregular in appearance suggestive of a miscarriage. There is normal blood flow seen within the uterus. 3. The cervix has some complex blood within it. 4. Normal appearing right ovary. 5. The left ovary is not visible. 6. Normal appearing adnexa. 7. No free fluid is seen. Recommendations: 1.Clinical correlation with the patient's History and Physical Exam. Ashley Howell, RT Review of ULTRASOUND.    I have personally reviewed images and report of recent ultrasound done at [facility].    Plan of management to be discussed with patient. Ashley Major, MD, FACOG [facility], [facility] 10/05/2019  4:27 PM    Assessment:  Spontaneous miscarriage Candida vaginitis  As she still has sx's, will treat w Terazol at this time. Plan follow up one month for retest of chlamydia Cont PNV, Norvasc  A total of 20 minutes were spent face-to-face with the patient as well as preparation, review, communication, and documentation during this encounter.   Ashley Major, MD, [facility], [facility] 10/05/2019  4:30 PM

## 2019-10-20 ENCOUNTER — Telehealth: Payer: Self-pay

## 2019-10-20 ENCOUNTER — Other Ambulatory Visit: Payer: Self-pay | Admitting: Obstetrics & Gynecology

## 2019-10-20 MED ORDER — METRONIDAZOLE 500 MG PO TABS: 500.0000 mg | ORAL_TABLET | Freq: Two times a day (BID) | ORAL | 0 refills | Status: DC

## 2019-10-20 NOTE — Telephone Encounter (Signed)
Pt aware.

## 2019-10-20 NOTE — Telephone Encounter (Signed)
Pt calling; doesn't think rx for BV was sent in.  [phone]  Pt has finished terazol 7; her sx now is a fishy odor.

## 2019-11-02 ENCOUNTER — Ambulatory Visit: Payer: Medicaid Other | Admitting: Obstetrics & Gynecology

## 2019-11-03 ENCOUNTER — Encounter: Payer: Self-pay | Admitting: *Deleted

## 2019-11-03 ENCOUNTER — Telehealth: Payer: Self-pay | Admitting: Cardiovascular Disease

## 2019-11-03 NOTE — Telephone Encounter (Signed)
Prior cardiac notes reviewed No cardiac reason why she could not get the covid vaccine We can provide a letter saying she has cardiac clearance for vaccine

## 2019-11-03 NOTE — Telephone Encounter (Signed)
Patient notified that ok to get the vaccine per Dr Mariah Milling. Patient will come by tomorrow to pick up the letter. Letter generated and placed at front desk for patient pick up.

## 2019-11-03 NOTE — Telephone Encounter (Signed)
Patient needs covid vaccine to prevent termination by 7/29 .  [facility] to [facility] but was told letter of clearance needed for due to card hx.

## 2019-11-03 NOTE — Telephone Encounter (Signed)
Routing to Dr Mariah Milling. Please advise on ok for COVID vaccine. Patient needs letter ASAP.

## 2019-11-16 ENCOUNTER — Ambulatory Visit (INDEPENDENT_AMBULATORY_CARE_PROVIDER_SITE_OTHER): Payer: Medicaid Other | Admitting: Obstetrics & Gynecology

## 2019-11-16 ENCOUNTER — Other Ambulatory Visit (HOSPITAL_COMMUNITY)
Admission: RE | Admit: 2019-11-16 | Discharge: 2019-11-16 | Disposition: A | Discharge status: Home / Self Care | Payer: Medicaid Other | Source: Ambulatory Visit | Attending: Obstetrics & Gynecology | Admitting: Obstetrics & Gynecology

## 2019-11-16 ENCOUNTER — Encounter: Payer: Self-pay | Admitting: Obstetrics & Gynecology

## 2019-11-16 ENCOUNTER — Other Ambulatory Visit: Payer: Self-pay

## 2019-11-16 VITALS — BP 120/80 | Ht 67.0 in | Wt 284.0 lb

## 2019-11-16 DIAGNOSIS — Z3009 Encounter for other general counseling and advice on contraception: Secondary | ICD-10-CM

## 2019-11-16 DIAGNOSIS — Z8619 Personal history of other infectious and parasitic diseases: Secondary | ICD-10-CM | POA: Insufficient documentation

## 2019-11-16 NOTE — Progress Notes (Signed)
History of Present Illness:  Ashley Howell is a 29 y.o. who was started on medicine for vaginitis including a chlamydial infection in June, she says since that time she has had no discharge, pain, burning, or irritation.  Pt reports having a period for 3 days since her miscarriage in June.  She does not desire BC at this time. IHIH.  She tolerated the Nexplanon well except for some mild chest discomfort towards the end of its three year cycle.  PMHx: She  has a past medical history of Anxiety, Hypertension, PFO (patent foramen ovale), and Shortness of breath dyspnea. Also,  has a past surgical history that includes Cesarean section (10/28/2008); Cesarean section (N/A, 02/22/2015); Cesarean section (04/13/2010); and Cesarean section (N/A, 10/09/2018)., family history includes Cancer in her maternal grandfather; Hypertension in her mother; Stroke in her paternal aunt.,  reports that she has been smoking cigarettes. She has a 0.50 pack-year smoking history. She has never used smokeless tobacco. She reports that she does not drink alcohol and does not use drugs. Current Meds  Medication Sig   amLODipine (NORVASC) 5 MG tablet Take 1 tablet (5 mg total) by mouth daily.   metroNIDAZOLE (FLAGYL) 500 MG tablet Take 1 tablet (500 mg total) by mouth 2 (two) times daily.  . Also, has No Known Allergies..  Review of Systems  All other systems reviewed and are negative.   Physical Exam:  BP 120/80    Ht 5\' 7"  (1.702 m)    Wt 284 lb (128.8 kg)    LMP 08/07/2019 (Exact Date)    BMI 44.48 kg/m  Body mass index is 44.48 kg/m. Physical Exam Constitutional:      General: She is not in acute distress.    Appearance: She is well-developed.  Genitourinary:     Pelvic exam was performed with patient supine.     Vagina and uterus normal.     No vaginal erythema or bleeding.     No cervical motion tenderness, discharge, polyp or nabothian cyst.     Uterus is mobile.     Uterus is not enlarged.     No  uterine mass detected.    Uterus is midaxial.     No right or left adnexal mass present.     Right adnexa not tender.     Left adnexa not tender.  HENT:     Head: Normocephalic and atraumatic.     Nose: Nose normal.  Abdominal:     General: There is no distension.     Palpations: Abdomen is soft.     Tenderness: There is no abdominal tenderness.  Musculoskeletal:        General: Normal range of motion.  Neurological:     Mental Status: She is alert and oriented to person, place, and time.     Cranial Nerves: No cranial nerve deficit.  Skin:    General: Skin is warm and dry.  Psychiatric:        Attention and Perception: Attention normal.        Mood and Affect: Mood and affect normal.        Speech: Speech normal.        Behavior: Behavior normal.        Thought Content: Thought content normal.        Judgment: Judgment normal.     Assessment:  Problem List Items Addressed This Visit    Miscarriage, recovered Contraception management, non desired    All options discusseed History  of chlamydia       Relevant Orders   Cervicovaginal ancillary only Test for recurrence today, no symptoms     A total of 20 minutes were spent face-to-face with the patient as well as preparation, review, communication, and documentation during this encounter.   Annamarie Major, MD, [facility], [facility] 11/16/2019  3:45 PM

## 2019-11-18 LAB — CERVICOVAGINAL ANCILLARY ONLY
Chlamydia: NEGATIVE
Comment: NEGATIVE
Comment: NEGATIVE
Comment: NORMAL
Neisseria Gonorrhea: NEGATIVE
Trichomonas: NEGATIVE

## 2019-11-20 ENCOUNTER — Other Ambulatory Visit: Payer: Medicaid Other

## 2019-11-23 ENCOUNTER — Other Ambulatory Visit: Payer: Medicaid Other

## 2019-12-15 ENCOUNTER — Encounter: Payer: Self-pay | Admitting: Obstetrics and Gynecology

## 2019-12-15 ENCOUNTER — Ambulatory Visit (INDEPENDENT_AMBULATORY_CARE_PROVIDER_SITE_OTHER): Payer: Medicaid Other | Admitting: Obstetrics and Gynecology

## 2019-12-15 ENCOUNTER — Other Ambulatory Visit: Payer: Self-pay

## 2019-12-15 VITALS — BP 140/84 | Ht 67.0 in | Wt 280.0 lb

## 2019-12-15 DIAGNOSIS — B9689 Other specified bacterial agents as the cause of diseases classified elsewhere: Secondary | ICD-10-CM

## 2019-12-15 DIAGNOSIS — N643 Galactorrhea not associated with childbirth: Secondary | ICD-10-CM

## 2019-12-15 DIAGNOSIS — N760 Acute vaginitis: Secondary | ICD-10-CM

## 2019-12-15 MED ORDER — CLINDAMYCIN HCL 300 MG PO CAPS: 300.0000 mg | ORAL_CAPSULE | Freq: Two times a day (BID) | ORAL | 0 refills | Status: AC

## 2019-12-15 NOTE — Progress Notes (Signed)
Center, [facility]   Chief Complaint  Patient presents with   Vaginal Odor    no discharge, itchiness x 3 weeks   Breast Exam    nipple discharge, tenderness, no lumps swelling  x 2 weeks    HPI:      [name] is a 29 y.o. [id number] whose LMP was Patient's last menstrual period was 12/01/2019 (approximate)., presents today for vaginal odor without increased d/c or irritation, for about 3 wks. Last treated with flagyl 7/21 without sx relief. Hx of BV. Neg chlamydia TOC 11/16/19, no new sex partners since. No LBP, pelvic pain, fevers. Uses dove sens skin soap, no dryer sheets, no wipes. Not taking probiotics, not using condoms.   Has also had LT nipple milky d/c with manipulation recently. S/p SAB 6/21. No breast masses. Does have bilat nipple rings without evidence of infection.   Past Medical History:  Diagnosis Date   Anxiety    Hypertension    PFO (patent foramen ovale)    a. 12/2014 Echo: EF 55-60%, mildly dil LA. PFO w/o significant shunting (reviewed by Dr. Mariah Milling 07/2018).   Shortness of breath dyspnea     Past Surgical History:  Procedure Laterality Date   CESAREAN SECTION  10/28/2008   CESAREAN SECTION N/A 02/22/2015   Procedure: CESAREAN SECTION;  Surgeon: Nadara Mustard, MD;  Location: [facility] ORS;  Service: Obstetrics;  Laterality: N/A;   CESAREAN SECTION  04/13/2010   CESAREAN SECTION N/A 10/09/2018   Procedure: CESAREAN SECTION;  Surgeon: Natale Milch, MD;  Location: [facility] ORS;  Service: Obstetrics;  Laterality: N/A;    Family History  Problem Relation Age of Onset   Hypertension Mother    Stroke Paternal Aunt    Cancer Maternal Grandfather        Colon    Social History   Socioeconomic History   Marital status: Single    Spouse name: Not on file   Number of children: 4   Years of education: Not on file   Highest education level: Not on file  Occupational History   Occupation: CNA  Tobacco Use    Smoking status: Current Some Day Smoker    Packs/day: 0.25    Years: 2.00    Pack years: 0.50    Types: Cigarettes   Smokeless tobacco: Never Used  Building services engineer Use: Never used  Substance and Sexual Activity   Alcohol use: No   Drug use: No   Sexual activity: Yes    Birth control/protection: None  Other Topics Concern   Not on file  Social History Narrative   Not on file   Social Determinants of Health   Financial Resource Strain:    Difficulty of Paying Living Expenses: Not on file  Food Insecurity:    Worried About Programme researcher, broadcasting/film/video in the Last Year: Not on file   [employer] of Food in the Last Year: Not on file  Transportation Needs:    Lack of Transportation (Medical): Not on file   Lack of Transportation (Non-Medical): Not on file  Physical Activity:    Days of Exercise per Week: Not on file   Minutes of Exercise per Session: Not on file  Stress:    Feeling of Stress : Not on file  Social Connections:    Frequency of Communication with Friends and Family: Not on file   Frequency of Social Gatherings with Friends and Family: Not on file  Attends Religious Services: Not on file   Active Member of Clubs or Organizations: Not on file   Attends Banker Meetings: Not on file   Marital Status: Not on file  Intimate Partner Violence:    Fear of Current or Ex-Partner: Not on file   Emotionally Abused: Not on file   Physically Abused: Not on file   Sexually Abused: Not on file    Outpatient Medications Prior to Visit  Medication Sig Dispense Refill   amLODipine (NORVASC) 5 MG tablet Take 1 tablet (5 mg total) by mouth daily. 30 tablet 9   aspirin 81 MG chewable tablet Chew 81 mg by mouth daily.      metroNIDAZOLE (FLAGYL) 500 MG tablet Take 1 tablet (500 mg total) by mouth 2 (two) times daily. 14 tablet 0   ondansetron (ZOFRAN ODT) 4 MG disintegrating tablet Take 1 tablet (4 mg total) by mouth every 6 (six) hours as  needed for nausea. 20 tablet 0   terconazole (TERAZOL 7) 0.4 % vaginal cream Place 1 applicator vaginally at bedtime. 45 g 0   No facility-administered medications prior to visit.      ROS:  Review of Systems  Constitutional: Negative for fever.  Gastrointestinal: Negative for blood in stool, constipation, diarrhea, nausea and vomiting.  Genitourinary: Negative for dyspareunia, dysuria, flank pain, frequency, hematuria, urgency, vaginal bleeding, vaginal discharge and vaginal pain.  Musculoskeletal: Negative for back pain.  Skin: Negative for rash.   BREAST: nipple d/c/tenderness   OBJECTIVE:   Vitals:  BP 140/84    Ht 5\' 7"  (1.702 m)    Wt 280 lb (127 kg)    LMP 12/01/2019 (Approximate)    Breastfeeding No    BMI 43.85 kg/m   Physical Exam Vitals reviewed.  Constitutional:      Appearance: She is well-developed.  Pulmonary:     Effort: Pulmonary effort is normal.  Chest:     Breasts: Breasts are symmetrical.        Right: No inverted nipple, mass, nipple discharge, skin change or tenderness.        Left: No inverted nipple, mass, nipple discharge, skin change or tenderness.     Comments: NO NIPPLE D/C ON EXAM Genitourinary:    General: Normal vulva.     Pubic Area: No rash.      Labia:        Right: No rash, tenderness or lesion.        Left: No rash, tenderness or lesion.      Vagina: Vaginal discharge present. No erythema or tenderness.     Cervix: Normal.     Uterus: Normal. Not enlarged and not tender.      Adnexa: Right adnexa normal and left adnexa normal.       Right: No mass or tenderness.         Left: No mass or tenderness.    Musculoskeletal:        General: Normal range of motion.     Cervical back: Normal range of motion.  Skin:    General: Skin is warm and dry.  Neurological:     General: No focal deficit present.     Mental Status: She is alert and oriented to person, place, and time.     Cranial Nerves: No cranial nerve deficit.    Psychiatric:        Mood and Affect: Mood normal.        Behavior: Behavior normal.  Thought Content: Thought content normal.        Judgment: Judgment normal.     Results: Results for orders placed or performed in visit on 12/15/19 (from the past 24 hour(s))  POCT Wet Prep with KOH     Status: Abnormal   Collection Time: 12/16/19  9:58 AM  Result Value Ref Range   Trichomonas, UA Negative    Clue Cells Wet Prep HPF POC pos    Epithelial Wet Prep HPF POC     Yeast Wet Prep HPF POC neg    Bacteria Wet Prep HPF POC     RBC Wet Prep HPF POC     WBC Wet Prep HPF POC     KOH Prep POC Positive (A) Negative     Assessment/Plan: Bacterial vaginosis - Plan: clindamycin (CLEOCIN) 300 MG capsule, POCT Wet Prep with KOH; Pos sx and wet prep. Failed flagyl tx. Treat with clindamycin, Rx eRxd. Add probiotics, condoms. F/u prn.   Galactorrhea - Plan: TSH + free T4, Prolactin; neg exam today. Check labs. If neg, most likely related to recent pregnancy (s/p SAB). Reassurance.   Meds ordered this encounter  Medications   clindamycin (CLEOCIN) 300 MG capsule    Sig: Take 1 capsule (300 mg total) by mouth 2 (two) times daily for 7 days.    Dispense:  14 capsule    Refill:  0    Order Specific Question:   Supervising Provider    Answer:   Nadara Mustard [[id number]]      Return if symptoms worsen or fail to improve.  Jakiera Ehler B. Mateen Franssen, PA-C 12/16/2019 10:00 AM

## 2019-12-16 ENCOUNTER — Encounter: Payer: Self-pay | Admitting: Obstetrics and Gynecology

## 2019-12-16 LAB — POCT WET PREP WITH KOH
Clue Cells Wet Prep HPF POC: POSITIVE
KOH Prep POC: POSITIVE — AB
Trichomonas, UA: NEGATIVE
Yeast Wet Prep HPF POC: NEGATIVE

## 2019-12-16 NOTE — Patient Instructions (Signed)
I value your feedback and entrusting us with your care. If you get a [facility] patient survey, I would appreciate you taking the time to let us know about your experience today. Thank you!  As of March 19, 2019, your lab results will be released to your MyChart immediately, before I even have a chance to see them. Please give me time to review them and contact you if there are any abnormalities. Thank you for your patience.  

## 2019-12-17 ENCOUNTER — Other Ambulatory Visit: Payer: Self-pay | Admitting: Obstetrics & Gynecology

## 2019-12-17 ENCOUNTER — Other Ambulatory Visit: Payer: Medicaid Other

## 2019-12-17 DIAGNOSIS — Z131 Encounter for screening for diabetes mellitus: Secondary | ICD-10-CM

## 2019-12-17 DIAGNOSIS — O99210 Obesity complicating pregnancy, unspecified trimester: Secondary | ICD-10-CM

## 2019-12-18 ENCOUNTER — Other Ambulatory Visit: Payer: Medicaid Other

## 2019-12-22 ENCOUNTER — Other Ambulatory Visit: Payer: Medicaid Other

## 2019-12-22 ENCOUNTER — Other Ambulatory Visit: Payer: Self-pay | Admitting: Obstetrics & Gynecology

## 2020-02-18 ENCOUNTER — Encounter: Payer: Self-pay | Admitting: Cardiovascular Disease

## 2020-03-16 ENCOUNTER — Other Ambulatory Visit (HOSPITAL_COMMUNITY)
Admission: RE | Admit: 2020-03-16 | Discharge: 2020-03-16 | Disposition: A | Discharge status: Home / Self Care | Payer: Medicaid Other | Source: Ambulatory Visit | Attending: Obstetrics and Gynecology | Admitting: Obstetrics and Gynecology

## 2020-03-16 ENCOUNTER — Ambulatory Visit (INDEPENDENT_AMBULATORY_CARE_PROVIDER_SITE_OTHER): Payer: Medicaid Other | Admitting: Obstetrics and Gynecology

## 2020-03-16 ENCOUNTER — Encounter: Payer: Self-pay | Admitting: Obstetrics and Gynecology

## 2020-03-16 ENCOUNTER — Other Ambulatory Visit: Payer: Self-pay

## 2020-03-16 VITALS — BP 120/80 | Wt 284.0 lb

## 2020-03-16 DIAGNOSIS — Z113 Encounter for screening for infections with a predominantly sexual mode of transmission: Secondary | ICD-10-CM

## 2020-03-16 DIAGNOSIS — O0992 Supervision of high risk pregnancy, unspecified, second trimester: Secondary | ICD-10-CM | POA: Insufficient documentation

## 2020-03-16 DIAGNOSIS — Z124 Encounter for screening for malignant neoplasm of cervix: Secondary | ICD-10-CM

## 2020-03-16 DIAGNOSIS — O0993 Supervision of high risk pregnancy, unspecified, third trimester: Secondary | ICD-10-CM | POA: Insufficient documentation

## 2020-03-16 DIAGNOSIS — O219 Vomiting of pregnancy, unspecified: Secondary | ICD-10-CM

## 2020-03-16 DIAGNOSIS — Q2112 Patent foramen ovale: Secondary | ICD-10-CM

## 2020-03-16 DIAGNOSIS — Z7185 Encounter for immunization safety counseling: Secondary | ICD-10-CM

## 2020-03-16 DIAGNOSIS — Q211 Atrial septal defect, unspecified: Secondary | ICD-10-CM

## 2020-03-16 DIAGNOSIS — I10 Essential (primary) hypertension: Secondary | ICD-10-CM

## 2020-03-16 DIAGNOSIS — Z369 Encounter for antenatal screening, unspecified: Secondary | ICD-10-CM

## 2020-03-16 DIAGNOSIS — Z131 Encounter for screening for diabetes mellitus: Secondary | ICD-10-CM

## 2020-03-16 DIAGNOSIS — O0991 Supervision of high risk pregnancy, unspecified, first trimester: Secondary | ICD-10-CM

## 2020-03-16 DIAGNOSIS — O34219 Maternal care for unspecified type scar from previous cesarean delivery: Secondary | ICD-10-CM

## 2020-03-16 DIAGNOSIS — O99210 Obesity complicating pregnancy, unspecified trimester: Secondary | ICD-10-CM

## 2020-03-16 DIAGNOSIS — N926 Irregular menstruation, unspecified: Secondary | ICD-10-CM

## 2020-03-16 DIAGNOSIS — Z3201 Encounter for pregnancy test, result positive: Secondary | ICD-10-CM

## 2020-03-16 MED ORDER — CITRANATAL ASSURE 35-1 & 300 MG PO MISC: 1.0000 | Freq: Every day | ORAL | 3 refills | Status: DC

## 2020-03-16 MED ORDER — DOXYLAMINE-PYRIDOXINE 10-10 MG PO TBEC: 2.0000 | DELAYED_RELEASE_TABLET | Freq: Every day | ORAL | 5 refills | Status: DC
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
## 2020-03-17 ENCOUNTER — Ambulatory Visit: Payer: Medicaid Other | Admitting: Physician Assistant

## 2020-03-17 LAB — COMPREHENSIVE METABOLIC PANEL
ALT: 23 IU/L (ref 0–32)
AST: 15 IU/L (ref 0–40)
Albumin/Globulin Ratio: 1.4 (ref 1.2–2.2)
Albumin: 4.2 g/dL (ref 3.9–5.0)
Alkaline Phosphatase: 71 IU/L (ref 44–121)
BUN/Creatinine Ratio: 14 (ref 9–23)
BUN: 11 mg/dL (ref 6–20)
Bilirubin Total: <0.2 mg/dL (ref 0.0–1.2)
CO2: 21 mmol/L (ref 20–29)
Calcium: 10.2 mg/dL (ref 8.7–10.2)
Chloride: 102 mmol/L (ref 96–106)
Creatinine, Ser: 0.8 mg/dL (ref 0.57–1.00)
GFR calc Af Amer: 115 mL/min/{1.73_m2} (ref >59)
GFR calc non Af Amer: 100 mL/min/{1.73_m2} (ref >59)
Globulin, Total: 3 g/dL (ref 1.5–4.5)
Glucose: 98 mg/dL (ref 65–99)
Potassium: 4 mmol/L (ref 3.5–5.2)
Sodium: 137 mmol/L (ref 134–144)
Total Protein: 7.2 g/dL (ref 6.0–8.5)

## 2020-03-17 LAB — RPR+RH+ABO+RUB AB+AB SCR+CB...
Antibody Screen: NEGATIVE
HIV Screen 4th Generation wRfx: NONREACTIVE
Hematocrit: 40.5 % (ref 34.0–46.6)
Hemoglobin: 13.4 g/dL (ref 11.1–15.9)
Hepatitis B Surface Ag: NEGATIVE
MCH: 26.9 pg (ref 26.6–33.0)
MCHC: 33.1 g/dL (ref 31.5–35.7)
MCV: 81 fL (ref 79–97)
Platelets: 353 10*3/uL (ref 150–450)
RBC: 4.98 x10E6/uL (ref 3.77–5.28)
RDW: 13.9 % (ref 11.7–15.4)
RPR Ser Ql: NONREACTIVE
Rh Factor: POSITIVE
Rubella Antibodies, IGG: <0.9 index — ABNORMAL LOW (ref >0.99)
Varicella zoster IgG: 632 index (ref >165)
WBC: 9.8 10*3/uL (ref 3.4–10.8)

## 2020-03-18 ENCOUNTER — Telehealth: Payer: Self-pay | Admitting: Obstetrics and Gynecology

## 2020-03-18 ENCOUNTER — Telehealth: Payer: Self-pay

## 2020-03-18 DIAGNOSIS — B9689 Other specified bacterial agents as the cause of diseases classified elsewhere: Secondary | ICD-10-CM

## 2020-03-18 LAB — CERVICOVAGINAL ANCILLARY ONLY
Bacterial Vaginitis (gardnerella): POSITIVE — AB
Chlamydia: NEGATIVE
Comment: NEGATIVE
Comment: NEGATIVE
Comment: NEGATIVE
Comment: NORMAL
Neisseria Gonorrhea: NEGATIVE
Trichomonas: NEGATIVE

## 2020-03-18 MED ORDER — METRONIDAZOLE 0.75 % VA GEL: 1.0000 | Freq: Every day | VAGINAL | 0 refills | Status: AC

## 2020-03-18 NOTE — Telephone Encounter (Signed)
Telephone call to patient to review results from NOB. Patient + for BV. Will Rx topical treatment. Patient states understanding. All questions answered.

## 2020-03-18 NOTE — Telephone Encounter (Signed)
Pt calling; was seen a few days ago; wants results.   [phone]

## 2020-03-20 LAB — URINE CULTURE

## 2020-03-22 LAB — CYTOLOGY - PAP
Adequacy: ABSENT
Diagnosis: NEGATIVE

## 2020-03-22 LAB — PROTEIN / CREATININE RATIO, URINE
Creatinine, Urine: 171.5 mg/dL
Protein, Ur: 20.3 mg/dL
Protein/Creat Ratio: 118 mg/g creat (ref 0–200)

## 2020-03-24 ENCOUNTER — Telehealth: Payer: Self-pay

## 2020-03-24 NOTE — Telephone Encounter (Signed)
Patient inquiring about status of Prior authorization for Diclegis. LJ#[id number]

## 2020-03-24 NOTE — Telephone Encounter (Signed)
Per Medical Records, no Prior Authorization request has been received for this patient. Nothing found in Cover my Meds. Will check again Initiate prior authorization tomorrow.

## 2020-03-26 LAB — URINE DRUG PANEL 7
Amphetamines, Urine: NEGATIVE ng/mL
Barbiturate Quant, Ur: NEGATIVE ng/mL
Benzodiazepine Quant, Ur: NEGATIVE ng/mL
Cannabinoid Quant, Ur: NEGATIVE ng/mL
Cocaine (Metab.): NEGATIVE ng/mL
Opiate Quant, Ur: NEGATIVE ng/mL
PCP Quant, Ur: NEGATIVE ng/mL

## 2020-03-30 ENCOUNTER — Ambulatory Visit (INDEPENDENT_AMBULATORY_CARE_PROVIDER_SITE_OTHER): Payer: Medicaid Other

## 2020-03-30 ENCOUNTER — Other Ambulatory Visit: Payer: Medicaid Other

## 2020-03-30 ENCOUNTER — Ambulatory Visit (INDEPENDENT_AMBULATORY_CARE_PROVIDER_SITE_OTHER): Payer: Medicaid Other | Admitting: Obstetrics and Gynecology

## 2020-03-30 ENCOUNTER — Other Ambulatory Visit: Payer: Self-pay

## 2020-03-30 VITALS — BP 132/80 | Wt 288.0 lb

## 2020-03-30 DIAGNOSIS — O0991 Supervision of high risk pregnancy, unspecified, first trimester: Secondary | ICD-10-CM

## 2020-03-30 DIAGNOSIS — O99210 Obesity complicating pregnancy, unspecified trimester: Secondary | ICD-10-CM

## 2020-03-30 DIAGNOSIS — Z1379 Encounter for other screening for genetic and chromosomal anomalies: Secondary | ICD-10-CM

## 2020-03-30 DIAGNOSIS — N926 Irregular menstruation, unspecified: Secondary | ICD-10-CM

## 2020-03-30 DIAGNOSIS — Z3149 Encounter for other procreative investigation and testing: Secondary | ICD-10-CM

## 2020-03-30 DIAGNOSIS — Z3A10 10 weeks gestation of pregnancy: Secondary | ICD-10-CM
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
## 2020-03-30 NOTE — Patient Instructions (Signed)
For nausea (these may be purchased over-the-counter): -Vitamin B6 (pyridoxine):  25 mg three times each day (may buy 100 mg tablet and take twice per day or try to cut into 4 equal pieces and take 1 piece three times each day).  - doxylamine (found in [employer] and other sleep agents that can be bought in the store): take 12.5 - 25 mg at bedtime.  May take up to 25 mg three time each day.  However, keep in mind that this might make you sleepy.  

## 2020-03-31 LAB — GLUCOSE, 1 HOUR GESTATIONAL: Gestational Diabetes Screen: 100 mg/dL (ref 65–139)

## 2020-04-06 LAB — MATERNIT 21 PLUS CORE, BLOOD
Fetal Fraction: 5
Result (T21): NEGATIVE
Trisomy 13 (Patau syndrome): NEGATIVE
Trisomy 18 (Edwards syndrome): NEGATIVE
Trisomy 21 (Down syndrome): NEGATIVE

## 2020-04-11 NOTE — Progress Notes (Deleted)
Evaluation Performed:  Follow-up visit  Date:  04/11/2020   ID:  Ashley Howell, DOB 1991/02/19, MRN [id number]  Patient Location:  [address] [place] [address]   Provider location:   [facility], [employer] office  PCP:  Center, [facility]  Cardiologist:  Hubbard Robinson [facility]   Chief Complaint: Hypertension in pregnancy, history of PFO  New patient  History of Present Illness:    Ashley Howell is a 30 y.o. female who presents via audio/video conferencing for a telehealth visit today.   The patient does not symptoms concerning for COVID-19 infection (fever, chills, cough, or new SHORTNESS OF BREATH).   30 y.o. female [phone] at [redacted]w[redacted]d  Patient has a past medical history of gestational hypertension and is currently taking Labetalol 100 mg BID Referred for PFO/ASD  Appears echocardiogram has been recently ordered but not completed Prior echocardiogram 2016 with conflicting details Details a PFO but also details possible ASD with shunting  In the hospital July 28, 2022 hypertension Follow-up pressures were normal, she had a mild headache  She has 3 children  9, 8 3, yo children Reports relatively uncomplicated pregnancies All by C-sections  At baseline no significant shortness of breath or chest pain with exertion labetolol holding the pressures No swelling Has only required medication for blood pressure this pregnancy   Prior CV studies:   The following studies were reviewed today:  Echocardiogram 12/2014, results discussed with her in detail - Left ventricle: The cavity size was normal. Systolic function was   normal. The estimated ejection fraction was in the range of 55%   to 60%. Wall motion was normal; there were no regional wall   motion abnormalities. Left ventricular diastolic function   parameters were normal. - Aortic valve: Valve area (Vmax): 2.49 cm^2. - Left atrium: The atrium was mildly  dilated. - Atrial septum: There was a patent foramen ovale. Echo contrast   study showed no right-to-left atrial level shunt, at baseline or   with provocation.  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  Read by outside cardiologist  Past Medical History:  Diagnosis Date  . Anxiety   . Benign essential hypertension, antepartum 05/19/2018   160/99 at anatomy scan 18 weeks   Pt sent to ER on 05/15/2018 resolved with rest   Elevated at u/s visit - labetalol 100 mg bid initiated  . Hypertension   . PFO (patent foramen ovale)    a. 12/2014 Echo: EF 55-60%, mildly dil LA. PFO w/o significant shunting (reviewed by Dr. Mariah Milling 07/2018).  . Shortness of breath dyspnea    Past Surgical History:  Procedure Laterality Date  . CESAREAN SECTION  10/28/2008  . CESAREAN SECTION N/A 02/22/2015   Procedure: CESAREAN SECTION;  Surgeon: Nadara Mustard, MD;  Location: [facility] ORS;  Service: Obstetrics;  Laterality: N/A;  . CESAREAN SECTION  04/13/2010  . CESAREAN SECTION N/A 10/09/2018   Procedure: CESAREAN SECTION;  Surgeon: Natale Milch, MD;  Location: [facility] ORS;  Service: Obstetrics;  Laterality: N/A;     No outpatient medications have been marked as taking for the 04/13/20 encounter (Appointment) with Antonieta Iba, MD.     Allergies:   Patient has no known allergies.   Social History   Tobacco Use  . Smoking status: Current Some Day Smoker    Packs/day: 0.25    Years: 2.00    Pack  years: 0.50    Types: Cigarettes  . Smokeless tobacco: Never Used  Vaping Use  . Vaping Use: Never used  Substance Use Topics  . Alcohol use: No  . Drug use: No     Current Outpatient Medications on File Prior to Visit  Medication Sig Dispense Refill  . amLODipine (NORVASC) 5 MG tablet Take 1 tablet (5 mg total) by mouth daily. (Patient not taking: Reported on 03/16/2020) 30 tablet 9  . aspirin 81 MG chewable tablet Chew 81 mg by mouth daily.  (Patient  not taking: Reported on 03/16/2020)    . Doxylamine-Pyridoxine (DICLEGIS) 10-10 MG TBEC Take 2 tablets by mouth at bedtime. If symptoms persist, add one tablet in the morning and one in the afternoon 100 tablet 5  . metroNIDAZOLE (FLAGYL) 500 MG tablet Take 1 tablet (500 mg total) by mouth 2 (two) times daily. (Patient not taking: Reported on 03/16/2020) 14 tablet 0  . ondansetron (ZOFRAN ODT) 4 MG disintegrating tablet Take 1 tablet (4 mg total) by mouth every 6 (six) hours as needed for nausea. (Patient not taking: Reported on 03/16/2020) 20 tablet 0  . Prenat w/o A-FeCbGl-DSS-FA-DHA (CITRANATAL ASSURE) 35-1 & 300 MG tablet Take 1 tablet by mouth daily. 60 each 3  . terconazole (TERAZOL 7) 0.4 % vaginal cream Place 1 applicator vaginally at bedtime. (Patient not taking: Reported on 03/16/2020) 45 g 0   No current facility-administered medications on file prior to visit.     Family Hx: The patient's family history includes Cancer in her maternal grandfather; Hypertension in her mother; Stroke in her paternal aunt.  ROS:   Please see the history of present illness.    Review of Systems  Constitutional: Negative.   Respiratory: Negative.   Cardiovascular: Negative.   Gastrointestinal: Negative.   Musculoskeletal: Negative.   Neurological: Negative.   Psychiatric/Behavioral: Negative.   All other systems reviewed and are negative.     Labs/Other Tests and Data Reviewed:    Recent Labs: 09/27/2019: B Natriuretic Peptide 24.2 03/16/2020: ALT 23; BUN 11; Creatinine, Ser 0.80; Hemoglobin 13.4; Platelets 353; Potassium 4.0; Sodium 137   Recent Lipid Panel No results found for: CHOL, TRIG, HDL, CHOLHDL, LDLCALC, LDLDIRECT  Wt Readings from Last 3 Encounters:  03/30/20 288 lb (130.6 kg)  03/16/20 284 lb (128.8 kg)  12/15/19 280 lb (127 kg)     Exam:    Vital Signs: Vital signs may also be detailed in the HPI LMP 01/19/2020   Wt Readings from Last 3 Encounters:  03/30/20 288 lb (130.6  kg)  03/16/20 284 lb (128.8 kg)  12/15/19 280 lb (127 kg)   Temp Readings from Last 3 Encounters:  06/28/19 97.9 F (36.6 C) (Oral)  05/19/19 98.2 F (36.8 C) (Oral)  04/04/19 98.5 F (36.9 C) (Oral)   BP Readings from Last 3 Encounters:  03/30/20 132/80  03/16/20 120/80  12/15/19 140/84   Pulse Readings from Last 3 Encounters:  09/27/19 70  06/28/19 98  05/19/19 83    Pulse 110 over 60s, pulse 80s, respirations 16  Well nourished, well developed female in no acute distress. Constitutional:  oriented to person, place, and time. No distress.  Head: Normocephalic and atraumatic.  Eyes:  no discharge. No scleral icterus.  Neck: Normal range of motion. Neck supple.  Pulmonary/Chest: No audible wheezing, no distress, appears comfortable Musculoskeletal: Normal range of motion.  no  tenderness or deformity.  Neurological:   Coordination normal. Full exam not performed Skin:  No rash  Psychiatric:  normal mood and affect. behavior is normal. Thought content normal.    ASSESSMENT & PLAN:    Patent foramen ovale/PFO Conflicting reports detailed on prior echocardiogram 2016 by outside cardiologist, listing PFO and ASD We have obtained the images and I reviewed them personally myself -Minimal shunt if any noted indicating likely small PFO -Repeat study not indicated at this time No further work-up needed prior to delivery No anticoagulation needed  Benign essential hypertension, antepartum Blood pressure is well controlled on today's visit. No changes made to the medications. Reports not requiring labetalol before this pregnancy  Pre-existing essential hypertension during pregnancy, antepartum Recent evaluation for hypertension in the setting of headache Further follow-up, blood pressure was stable 110 systolic   COVID-19 Education: The signs and symptoms of COVID-19 were discussed with the patient and how to seek care for testing (follow up with PCP or arrange E-visit).   The importance of social distancing was discussed today.  Patient Risk:   After full review of this patients clinical status, I feel that they are at least moderate risk at this time.  Time:   Today, I have spent 25 minutes with the patient with telehealth technology discussing the cardiac and medical problems/diagnoses detailed above   10 min spent reviewing the chart prior to patient visit today   Medication Adjustments/Labs and Tests Ordered: Current medicines are reviewed at length with the patient today.  Concerns regarding medicines are outlined above.   Tests Ordered: No tests ordered   Medication Changes: No changes made   Disposition: Follow-up as needed   Signed, Julien Nordmann, MD  04/11/2020 4:06 PM    [facility] [address], [place], [place] [address]

## 2020-04-13 ENCOUNTER — Ambulatory Visit: Payer: Medicaid Other | Admitting: Cardiovascular Disease

## 2020-04-13 DIAGNOSIS — Z720 Tobacco use: Secondary | ICD-10-CM

## 2020-04-13 DIAGNOSIS — I10 Essential (primary) hypertension: Secondary | ICD-10-CM

## 2020-04-13 DIAGNOSIS — R0789 Other chest pain: Secondary | ICD-10-CM

## 2020-04-13 DIAGNOSIS — Q211 Atrial septal defect: Secondary | ICD-10-CM

## 2020-04-13 DIAGNOSIS — Z6841 Body Mass Index (BMI) 40.0 and over, adult: Secondary | ICD-10-CM

## 2020-04-14 LAB — INHERITEST CORE(CF97,SMA,FRAX)

## 2020-04-15 ENCOUNTER — Other Ambulatory Visit: Payer: Self-pay | Admitting: Obstetrics and Gynecology

## 2020-04-15 DIAGNOSIS — Z148 Genetic carrier of other disease: Secondary | ICD-10-CM

## 2020-04-19 NOTE — Progress Notes (Deleted)
Cardiology Office Note:    Date:  04/19/2020   ID:  Ashley Howell, DOB 1990/05/27, MRN [id number]  PCP:  [facility], [facility]  [facility] Cardiologist:  Julien Nordmann, MD  [facility] Electrophysiologist:  None   Referring MD: Zipporah Plants, CNM   Chief Complaint: 1 year follow-up  History of Present Illness:    Ashley Howell is a 30 y.o. female with a hx of gestational HTN, HTN, PFO/ASD, current smoker, anxiety, who is being seen for follow-up. She had an outside echo in 2016 that reported PFO with possible ASD shunting however after reivew by primary cardiologist it was felt the shunt was minimal and liekly small PFO. Further work was deferred until ater delivery. A/c was not inficated. seh underwent c-section 10/2018 and she was seen in clinc 12/2018 and was doing well with no further headaches. She was still smoking and drinking alcohol. She was taking amlodipine/ She also described atypical chest pain. An echo with bubble study was ordered which showed preserved pump function, no signs of ASD or VSD and low suspicion of PFO. Small PFO could not be definitely excluded.   Today,   PFO Thought to be seen on echo in 2016 however felt to be samll after review of images. Echo limited bubble study with no ASD/VSD and cannot rule out small PFO but low suspicion.   Atypical chest pain Echo as above with prserved EF and no WMA  HTN amlodpine  Obesity  Tobacco use  Past Medical History:  Diagnosis Date  . Anxiety   . Benign essential hypertension, antepartum 05/19/2018   160/99 at anatomy scan 18 weeks   Pt sent to ER on 05/15/2018 resolved with rest   Elevated at u/s visit - labetalol 100 mg bid initiated  . Hypertension   . PFO (patent foramen ovale)    a. 12/2014 Echo: EF 55-60%, mildly dil LA. PFO w/o significant shunting (reviewed by Dr. Mariah Milling 07/2018).  . Shortness of breath dyspnea     Past Surgical History:  Procedure Laterality Date  .  CESAREAN SECTION  10/28/2008  . CESAREAN SECTION N/A 02/22/2015   Procedure: CESAREAN SECTION;  Surgeon: Nadara Mustard, MD;  Location: [facility] ORS;  Service: Obstetrics;  Laterality: N/A;  . CESAREAN SECTION  04/13/2010  . CESAREAN SECTION N/A 10/09/2018   Procedure: CESAREAN SECTION;  Surgeon: Natale Milch, MD;  Location: [facility] ORS;  Service: Obstetrics;  Laterality: N/A;    Current Medications: No outpatient medications have been marked as taking for the 04/20/20 encounter (Appointment) with Lennon Alstrom, PA-C.     Allergies:   Patient has no known allergies.   Social History   Socioeconomic History  . Marital status: Single    Spouse name: Not on file  . Number of children: 4  . Years of education: Not on file  . Highest education level: Not on file  Occupational History  . Occupation: CNA  Tobacco Use  . Smoking status: Current Some Day Smoker    Packs/day: 0.25    Years: 2.00    Pack years: 0.50    Types: Cigarettes  . Smokeless tobacco: Never Used  Vaping Use  . Vaping Use: Never used  Substance and Sexual Activity  . Alcohol use: No  . Drug use: No  . Sexual activity: Yes    Birth control/protection: None  Other Topics Concern  . Not on file  Social History Narrative  . Not on file   Social Determinants  of Health   Financial Resource Strain: Not on file  Food Insecurity: Not on file  Transportation Needs: Not on file  Physical Activity: Not on file  Stress: Not on file  Social Connections: Not on file     Family History: The patient's ***family history includes Cancer in her maternal grandfather; Hypertension in her mother; Stroke in her paternal aunt.  ROS:   Please see the history of present illness.    *** All other systems reviewed and are negative.  EKGs/Labs/Other Studies Reviewed:    The following studies were reviewed today: ***  EKG:  EKG is *** ordered today.  The ekg ordered today demonstrates ***  Recent  Labs: 09/27/2019: B Natriuretic Peptide 24.2 03/16/2020: ALT 23; BUN 11; Creatinine, Ser 0.80; Hemoglobin 13.4; Platelets 353; Potassium 4.0; Sodium 137  Recent Lipid Panel No results found for: CHOL, TRIG, HDL, CHOLHDL, VLDL, LDLCALC, LDLDIRECT   Risk Assessment/Calculations:   {Does this patient have ATRIAL FIBRILLATION?[phone]}   Physical Exam:    VS:  LMP 01/19/2020     Wt Readings from Last 3 Encounters:  03/30/20 288 lb (130.6 kg)  03/16/20 284 lb (128.8 kg)  12/15/19 280 lb (127 kg)     GEN: *** Well nourished, well developed in no acute distress HEENT: Normal NECK: No JVD; No carotid bruits LYMPHATICS: No lymphadenopathy CARDIAC: ***RRR, no murmurs, rubs, gallops RESPIRATORY:  Clear to auscultation without rales, wheezing or rhonchi  ABDOMEN: Soft, non-tender, non-distended MUSCULOSKELETAL:  No edema; No deformity  SKIN: Warm and dry NEUROLOGIC:  Alert and oriented x 3 PSYCHIATRIC:  Normal affect   ASSESSMENT:    No diagnosis found. PLAN:    In order of problems listed above:  1. ***  Disposition: Follow up {follow up:15908} with ***   Shared Decision Making/Informed Consent   {Are you ordering a CV Procedure (e.g. stress test, cath, DCCV, TEE, etc)?   Press F2        :[id number]}    Signed, Wynton Hufstetler David Stall, PA-C  04/19/2020 3:14 PM    [facility]

## 2020-04-20 ENCOUNTER — Ambulatory Visit: Payer: Medicaid Other | Admitting: Physician Assistant

## 2020-04-20 ENCOUNTER — Telehealth: Payer: Self-pay | Admitting: Obstetrics and Gynecology

## 2020-04-20 ENCOUNTER — Ambulatory Visit: Payer: Medicaid Other | Admitting: Obstetrics and Gynecology

## 2020-04-20 NOTE — Telephone Encounter (Signed)
OB pt, wants Rx for sleep medication since not sleeping well.

## 2020-04-25 ENCOUNTER — Ambulatory Visit: Payer: Medicaid Other | Admitting: Physician Assistant

## 2020-04-26 ENCOUNTER — Ambulatory Visit: Payer: Medicaid Other

## 2020-04-27 ENCOUNTER — Ambulatory Visit (INDEPENDENT_AMBULATORY_CARE_PROVIDER_SITE_OTHER): Payer: Medicaid Other | Admitting: Obstetrics and Gynecology

## 2020-04-27 ENCOUNTER — Encounter: Payer: Self-pay | Admitting: Obstetrics and Gynecology

## 2020-04-27 ENCOUNTER — Other Ambulatory Visit: Payer: Self-pay

## 2020-04-27 VITALS — BP 114/72 | Ht 67.0 in | Wt 284.4 lb

## 2020-04-27 DIAGNOSIS — Z3A14 14 weeks gestation of pregnancy: Secondary | ICD-10-CM

## 2020-04-27 DIAGNOSIS — O0992 Supervision of high risk pregnancy, unspecified, second trimester: Secondary | ICD-10-CM

## 2020-04-27 DIAGNOSIS — I10 Essential (primary) hypertension: Secondary | ICD-10-CM
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
## 2020-04-28 ENCOUNTER — Ambulatory Visit: Payer: Medicaid Other | Admitting: Physician Assistant

## 2020-04-28 ENCOUNTER — Ambulatory Visit: Payer: Medicaid Other | Attending: Maternal & Fetal Medicine

## 2020-04-28 ENCOUNTER — Other Ambulatory Visit: Payer: Self-pay

## 2020-04-28 DIAGNOSIS — I10 Essential (primary) hypertension: Secondary | ICD-10-CM

## 2020-04-28 DIAGNOSIS — Z1371 Encounter for nonprocreative screening for genetic disease carrier status: Secondary | ICD-10-CM

## 2020-04-28 DIAGNOSIS — Q211 Atrial septal defect, unspecified: Secondary | ICD-10-CM

## 2020-04-28 DIAGNOSIS — O99210 Obesity complicating pregnancy, unspecified trimester: Secondary | ICD-10-CM

## 2020-04-28 DIAGNOSIS — O0992 Supervision of high risk pregnancy, unspecified, second trimester: Secondary | ICD-10-CM

## 2020-04-28 NOTE — Progress Notes (Signed)
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  We reviewed those results as well as additional screening and testing options for this pregnancy.   SMA is a recessive genetic condition with variable age of onset and severity caused by mutations in the SMN1 gene.  To review, a recessive condition occurs in a child when both the mother and the father are carriers of the genetic condition and both pass on that altered gene to the child.  The features of SMA are caused by the loss of motor neurons which leads to progressive muscle weakness and muscle wasting (atrophy) in infancy.  There are several types of SMA based upon severity, but in the most common type (type 1), children may pass away as early as 33 years of age.  Recent advances in therapies for SMA show great promise in children who are diagnosed and treated early, even prior to the development of symptoms.  Carrier testing assesses the number of copies of the SMN1 gene, from zero to four.  Persons with one copy of the SMN1 gene are carriers, and those with no copies are affected with the condition.  Individuals with two or more copies have a reduced chance to be a carrier.  Persons with 3 or 4 copies of this gene are highly unlikely to be carriers.  However, someone with two copies of the gene can either have two copies of this gene on separate chromosomes and thus not be a carrier or they could have two copies of this gene on the same chromosome with no copies on the other chromosome and be a "silent" carrier.  This silent carrier status is known to be more common in persons of African American or Ashkenazi Jewish ancestry, particularly when they are found to be positive for a variant in the gene known as c. *3+80T>G. 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  This testing cannot confirm or eliminate the chance to have a child with SMA. It is estimated that current carrier screening can detect 94.8% of carriers in the Caucasian population and 70.5% of carriers in the African American population.    Then next option we reviewed is to have her partner tested to determine his status.  If he is found to be a carrier, then the pregnancy may be at increased risk for SMA and testing would be offered during the pregnancy through amniocentesis or at the time of birth. At this time, prior to testing, Leonette Most has a chance of 1 in 26 to be a carrier given his African American ancestry and no known family history of SMA.  Overall, the chance for this couple to have a child with SMA at this time is 1/72 X 1/34 X = 1 in 9,792. If he were to have testing that showed a low chance for him to be a carrier, this number could be decreased significantly.  If his testing were to show that he is a carrier, then the number would be increased. Leonette Most was unable to come to the visit today.  The patient stated that she would speak with him and let me know if they would like  to make arrangements for his testing.  This can be done through our clinic and sent to either [facility] or [facility], though [facility] may offer a less expensive option. Ms. Twitty was given my card and will call our office if she desires testing for [facility]  We also talked about the possibility of testing at the time of delivery. Beginning in 2021, all newborn babies in [place] are tested for SMA as well as numerous other conditions as part of state mandated newborn screening. Testing on cord blood or of the baby sometime after delivery could also be ordered if desired.  Lastly, we reviewed the results of the other testing performed by her OB for screening.  The results of the CF and  Fragile X screening were negative, thus greatly reducing the chance to have a child with either of these conditions.  MaterniT21 testing was ordered by [facility] and was negative. Hemoglobinopathy screening was previously performed at [facility].  We also reviewed the detailed family history and pregnancy history obtained during prior genetic counseling visits in 2016 and 2019.  She now has four healthy daughters, ages 1 to 68.  She reported no concerns with the girls and no changes in the family history.  In this pregnancy, Ms. Poznanski reported no complications or exposures in this pregnancy that would be expected to increase the risk for birth defects.  She does report a history of possible heart enlargement during pregnancy.  In 2019, it was recommended that she meet with a cardiologist.  Cardiac imaging previously showed an ASD and possible PFO.  The patient stated today that a bubble study after her last delivery showed no PFO. She has been scheduled to see cardiology in this pregnancy, but the appointment time had to be moved. I spoke with Dr. Grace Bushy and we stressed the importance of her following up with cardiology as soon as it is scheduled.  Because of her ASD, we reviewed the likely multifactorial inheritance of congenital heart defects and the estimated 5% recurrence risk in children of mothers with heart conditions.  We therefore put in a referral for a fetal echocardiogram at [redacted] weeks gestation. We also scheduled her for a detailed anatomy ultrasound and MFM consultation at [facility] at [place] at [redacted] weeks gestation.  Thank you for allowing [place] to be involved in the care of this patient.  We encouraged her to call with any questions or concerns as they arise.  We may be reached at [phone].  Plan of care:  Marland Kitchen Patient and partner to determine if they desire his testing. If so, she will call me to make arrangements. . If they decide not to have him tested, then patient plans to  follow up with newborn screening results at the time of delivery. . Detailed anatomy ultrasound at [facility]  06/02/20. . Orders placed for fetal echocardiogram in [place] (per patient request) at [redacted] weeks gestation.  Cherly Anderson, MS, CGC

## 2020-04-29 ENCOUNTER — Encounter: Payer: Self-pay | Admitting: Physician Assistant

## 2020-05-06 ENCOUNTER — Ambulatory Visit (INDEPENDENT_AMBULATORY_CARE_PROVIDER_SITE_OTHER): Payer: Medicaid Other | Admitting: Family

## 2020-05-06 ENCOUNTER — Encounter: Payer: Self-pay | Admitting: Family

## 2020-05-06 ENCOUNTER — Other Ambulatory Visit: Payer: Self-pay

## 2020-05-06 VITALS — BP 128/60 | HR 101 | Ht 67.0 in | Wt 284.0 lb

## 2020-05-06 DIAGNOSIS — Q211 Atrial septal defect: Secondary | ICD-10-CM

## 2020-05-06 DIAGNOSIS — Z720 Tobacco use: Secondary | ICD-10-CM | POA: Diagnosis not present

## 2020-05-06 DIAGNOSIS — Q2112 Patent foramen ovale: Secondary | ICD-10-CM

## 2020-05-06 DIAGNOSIS — Z6841 Body Mass Index (BMI) 40.0 and over, adult: Secondary | ICD-10-CM

## 2020-05-06 DIAGNOSIS — I10 Essential (primary) hypertension: Secondary | ICD-10-CM | POA: Diagnosis not present

## 2020-05-06 NOTE — Patient Instructions (Signed)
Medication Instructions:  No medication changes today.   *If you need a refill on your cardiac medications before your next appointment, please call your pharmacy*  Lab Work: None ordered today.   Testing/Procedures: None ordered today.   Follow-Up: At [facility], you and your health needs are our priority.  As part of our continuing mission to provide you with exceptional heart care, we have created designated Provider Care Teams.  These Care Teams include your primary Cardiologist (physician) and Advanced Practice Providers (APPs -  Physician Assistants and Nurse Practitioners) who all work together to provide you with the care you need, when you need it.  We recommend signing up for the patient portal called "MyChart".  Sign up information is provided on this After Visit Summary.  MyChart is used to connect with patients for Virtual Visits (Telemedicine).  Patients are able to view lab/test results, encounter notes, upcoming appointments, etc.  Non-urgent messages can be sent to your provider as well.   To learn more about what you can do with MyChart, go to [email or web address].    Your next appointment:   6 month(s) (or after delivery)  The format for your next appointment:   In Person  Provider:   You may see Julien Nordmann, MD or one of the following Advanced Practice Providers on your designated Care Team:    Nicolasa Ducking, NP  Eula Listen, PA-C  Marisue Ivan, PA-C  Cadence [place], [place]  Gillian Shields, NP  Other Instructions Congratulations on your pregnancy! Recommend staying well hydrated as this will help prevent the high heart rates from dehydration!

## 2020-05-06 NOTE — Progress Notes (Signed)
Office Visit    Patient Name: Ashley Howell Date of Encounter: 05/06/2020  Primary Care Provider:  [facility] Primary Cardiologist:  Julien Nordmann, MD Electrophysiologist:  None   Chief Complaint    Ashley Howell is a 30 y.o. female with a hx of gestational hypertension, possible PFO by echo, HTN, tobacco use presents today for follow up of hypertension in setting of pregnancy.    Past Medical History    Past Medical History:  Diagnosis Date  . Anxiety   . Benign essential hypertension, antepartum 05/19/2018   160/99 at anatomy scan 18 weeks   Pt sent to ER on 05/15/2018 resolved with rest   Elevated at u/s visit - labetalol 100 mg bid initiated  . Hypertension   . PFO (patent foramen ovale)    a. 12/2014 Echo: EF 55-60%, mildly dil LA. PFO w/o significant shunting (reviewed by Dr. Mariah Milling 07/2018).  . Shortness of breath dyspnea    Past Surgical History:  Procedure Laterality Date  . CESAREAN SECTION  10/28/2008  . CESAREAN SECTION N/A 02/22/2015   Procedure: CESAREAN SECTION;  Surgeon: Nadara Mustard, MD;  Location: [facility] ORS;  Service: Obstetrics;  Laterality: N/A;  . CESAREAN SECTION  04/13/2010  . CESAREAN SECTION N/A 10/09/2018   Procedure: CESAREAN SECTION;  Surgeon: Natale Milch, MD;  Location: [facility] ORS;  Service: Obstetrics;  Laterality: N/A;    Allergies  No Known Allergies  History of Present Illness    Ashley Howell is a 30 y.o. female with a hx of  gestational hypertension, possible PFO by echo, HTN, tobacco use last seen 12/2018 by Marisue Ivan, PA.  She had a prior echocardiogram in 2016 with conflicting details showing possible PFO but also possible ASD with shunting read by outside cardiologist.  However, she had echo bubble study 01/01/2019 with LVEF 60 to 65%, RV normal size and function, no ASD noted, low suspicion of PFO given negative contrast study though very small PFO cannot be definitively  excluded.  She presents today for follow-up.  She is pregnant with her fifth child and excited as this is her first boy. All of her deliveries have been by C-section and she plans to have this with delivery by C-section as well.  Tells me prior to this pregnancy she was working on weight loss cut out soda, stopped eating sweets and has been more active.  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  Reports her blood pressure has been well controlled.  Reports no shortness of breath nor dyspnea on exertion. Reports no chest pain, pressure, or tightness. No edema, orthopnea, PND. Reports no palpitations.   EKGs/Labs/Other Studies Reviewed:   The following studies were reviewed today:  Echo 01/01/2019  1. Left ventricular ejection fraction, by visual estimation, is 60 to  65%. The left ventricle has normal function. Normal left ventricular size.  There is no left ventricular hypertrophy.   2. Global right ventricle has normal systolic function.The right  ventricular size is normal. No increase in right ventricular wall  thickness.   3. Left atrial size was normal.   4. Saline contrast bubble study was performed which was nagative. No ASD  noted. Low suspicion of PFO given negative contrast study, though a very  small PFO can not be definatively excluded.   Echocardiogram 12/2014, results discussed with her in detail - Left ventricle: The cavity size was normal. Systolic function was  normal. The estimated ejection fraction was in the range of 55%   to 60%. Wall motion was normal; there were no regional wall   motion abnormalities. Left ventricular diastolic function   parameters were normal. - Aortic valve: Valve area (Vmax): 2.49 cm^2. - Left atrium: The atrium was mildly dilated. - Atrial septum: There was a patent foramen ovale. Echo contrast   study showed no right-to-left atrial level shunt, at baseline or   with provocation.   Impressions:   -  Possible atrial-septal defec (ASD) with left to right shunt   present.  Read by outside cardiologist  EKG:  EKG is ordered today.  The ekg ordered today demonstrates sinus tachycardia 101 bpm with no acute ST/T wave changes.  She had minimal voltage criteria for LVH which is likely normal variant.  Recent Labs: 09/27/2019: B Natriuretic Peptide 24.2 03/16/2020: ALT 23; BUN 11; Creatinine, Ser 0.80; Hemoglobin 13.4; Platelets 353; Potassium 4.0; Sodium 137  Recent Lipid Panel No results found for: CHOL, TRIG, HDL, CHOLHDL, VLDL, LDLCALC, LDLDIRECT   Home Medications   Current Meds  Medication Sig  . amLODipine (NORVASC) 5 MG tablet Take 1 tablet (5 mg total) by mouth daily.  Marland Kitchen aspirin 81 MG chewable tablet Chew 81 mg by mouth daily.  . Prenat w/o A-[id number]-[id number] (CITRANATAL ASSURE) 35-1 & 300 MG tablet Take 1 tablet by mouth daily.     Review of Systems     Review of Systems  Constitutional: Negative for chills, fever and malaise/fatigue.  Cardiovascular: Negative for chest pain, dyspnea on exertion, leg swelling, near-syncope, orthopnea, palpitations and syncope.  Respiratory: Negative for cough, shortness of breath and wheezing.   Gastrointestinal: Negative for nausea and vomiting.  Neurological: Negative for dizziness, light-headedness and weakness.   All other systems reviewed and are otherwise negative except as noted above.  Physical Exam    VS:  BP 128/60 (BP Location: Left Arm, Patient Position: Sitting, Cuff Size: Normal)   Pulse (!) 101   Ht 5\' 7"  (1.702 m)   Wt 284 lb (128.8 kg)   LMP 01/19/2020   SpO2 98%   BMI 44.48 kg/m  , BMI Body mass index is 44.48 kg/m.  Wt Readings from Last 3 Encounters:  05/06/20 284 lb (128.8 kg)  04/28/20 283 lb 8 oz (128.6 kg)  04/27/20 284 lb 6.4 oz (129 kg)    GEN: Well nourished, overweight, well developed, in no acute distress. HEENT: normal. Neck: Supple, no JVD, carotid bruits, or masses. Cardiac: RRR, no murmurs,  rubs, or gallops. No clubbing, cyanosis, edema.  Radials/PT 2+ and equal bilaterally.  Respiratory:  Respirations regular and unlabored, clear to auscultation bilaterally. GI: Soft, nontender, nondistended. MS: No deformity or atrophy. Skin: Warm and dry, no rash. Neuro:  Strength and sensation are intact. Psych: Normal affect.  Assessment & Plan    1. PFO - Unable to exclude small PFO by echo 12/2018.  She reports no dyspnea, near syncope, nor syncope.  No indication for repeat echocardiogram at this time as her echo 12/2018 showed normal LVEF and she has no symptoms suggesting heart failure.  2. Previously reported ASD -None noted by echo bubble study 12/2018.  No indication for repeat testing this time.  3. HTN - BP well controlled. Continue current antihypertensive regimen of amlodipine 5 mg daily.  If her OB prefers she be on alternative agent, could consider transition to labetalol during pregnancy.  4. Sinus tachycardia - EKG today ST 101 bpm with no acute ST/T  wave changes.  She has not had very much to drink this morning, only 1 glass of sweet tea.  We discussed the importance of staying well-hydrated during pregnancy and that this will help to prevent tachycardia.  She denies palpitations.  5. Tobacco use - Smoking cessation encouraged. Recommend utilization of 1800QUITNOW.  6. Obesity -Discussed the importance of eating healthy diet and staying active during the course of her pregnancy.  Discussed the role of this with her blood pressure.  Disposition: Follow up in 6 month(s) with Dr. Mariah Milling APP for reassessment of BP after delivery.  Signed, Alver Sorrow, NP 05/06/2020, 1:17 PM [facility]

## 2020-05-14 ENCOUNTER — Other Ambulatory Visit: Payer: Self-pay

## 2020-05-14 ENCOUNTER — Emergency Department: Payer: Medicaid Other

## 2020-05-14 DIAGNOSIS — Z79899 Other long term (current) drug therapy: Secondary | ICD-10-CM | POA: Insufficient documentation

## 2020-05-14 DIAGNOSIS — Z7982 Long term (current) use of aspirin: Secondary | ICD-10-CM | POA: Insufficient documentation

## 2020-05-14 DIAGNOSIS — Z3A16 16 weeks gestation of pregnancy: Secondary | ICD-10-CM | POA: Diagnosis not present

## 2020-05-14 DIAGNOSIS — R102 Pelvic and perineal pain: Secondary | ICD-10-CM | POA: Diagnosis not present

## 2020-05-14 DIAGNOSIS — F17210 Nicotine dependence, cigarettes, uncomplicated: Secondary | ICD-10-CM | POA: Insufficient documentation

## 2020-05-14 DIAGNOSIS — O26892 Other specified pregnancy related conditions, second trimester: Secondary | ICD-10-CM | POA: Diagnosis present

## 2020-05-14 LAB — URINALYSIS, COMPLETE (UACMP) WITH MICROSCOPIC
Bilirubin Urine: NEGATIVE
Glucose, UA: NEGATIVE mg/dL
Hgb urine dipstick: NEGATIVE
Ketones, ur: 5 mg/dL — AB
Leukocytes,Ua: NEGATIVE
Nitrite: NEGATIVE
Protein, ur: NEGATIVE mg/dL
Specific Gravity, Urine: 1.026 (ref 1.005–1.030)
pH: 5 (ref 5.0–8.0)

## 2020-05-14 LAB — CBC
HCT: 36 % (ref 36.0–46.0)
Hemoglobin: 11.9 g/dL — ABNORMAL LOW (ref 12.0–15.0)
MCH: 27.4 pg (ref 26.0–34.0)
MCHC: 33.1 g/dL (ref 30.0–36.0)
MCV: 82.9 fL (ref 80.0–100.0)
Platelets: 288 10*3/uL (ref 150–400)
RBC: 4.34 MIL/uL (ref 3.87–5.11)
RDW: 13.4 % (ref 11.5–15.5)
WBC: 10.4 10*3/uL (ref 4.0–10.5)
nRBC: 0 % (ref 0.0–0.2)

## 2020-05-14 LAB — COMPREHENSIVE METABOLIC PANEL
ALT: 18 U/L (ref 0–44)
AST: 19 U/L (ref 15–41)
Albumin: 3.3 g/dL — ABNORMAL LOW (ref 3.5–5.0)
Alkaline Phosphatase: 54 U/L (ref 38–126)
Anion gap: 7 (ref 5–15)
BUN: 6 mg/dL (ref 6–20)
CO2: 20 mmol/L — ABNORMAL LOW (ref 22–32)
Calcium: 8.9 mg/dL (ref 8.9–10.3)
Chloride: 107 mmol/L (ref 98–111)
Creatinine, Ser: 0.53 mg/dL (ref 0.44–1.00)
GFR, Estimated: >60 mL/min (ref >60)
Glucose, Bld: 138 mg/dL — ABNORMAL HIGH (ref 70–99)
Potassium: 3.1 mmol/L — ABNORMAL LOW (ref 3.5–5.1)
Sodium: 134 mmol/L — ABNORMAL LOW (ref 135–145)
Total Bilirubin: 0.3 mg/dL (ref 0.3–1.2)
Total Protein: 6.9 g/dL (ref 6.5–8.1)

## 2020-05-14 LAB — SAMPLE TO BLOOD BANK

## 2020-05-14 LAB — HCG, QUANTITATIVE, PREGNANCY: hCG, Beta Chain, Quant, S: 8890 m[IU]/mL — ABNORMAL HIGH (ref <5)

## 2020-05-14 NOTE — ED Triage Notes (Signed)
Pt states is [redacted] weeks pregnant and has been having pelvic pain for 2 weeks. Pt states does have nausea, denies vaginal bleeding, but states does feel like she is having some yeast vaginal discharge. Pt denies fever. Appears in no acute distress.

## 2020-05-15 ENCOUNTER — Emergency Department
Admission: EM | Admit: 2020-05-15 | Discharge: 2020-05-15 | Disposition: A | Discharge status: Home / Self Care | Payer: Medicaid Other | Attending: Emergency Medicine | Admitting: Emergency Medicine

## 2020-05-15 DIAGNOSIS — O26899 Other specified pregnancy related conditions, unspecified trimester: Secondary | ICD-10-CM

## 2020-05-15 LAB — WET PREP, GENITAL
Clue Cells Wet Prep HPF POC: NONE SEEN
Sperm: NONE SEEN
Trich, Wet Prep: NONE SEEN
WBC, Wet Prep HPF POC: NONE SEEN
Yeast Wet Prep HPF POC: NONE SEEN

## 2020-05-15 LAB — CHLAMYDIA/NGC RT PCR (ARMC ONLY)
Chlamydia Tr: NOT DETECTED
N gonorrhoeae: NOT DETECTED

## 2020-05-15 MED ORDER — ACETAMINOPHEN 500 MG PO TABS
1000.0000 mg | ORAL_TABLET | Freq: Once | ORAL | Status: AC
  Administered 2020-05-15: 1000 mg via ORAL
  Filled 2020-05-15: qty 2

## 2020-05-15 NOTE — ED Notes (Signed)
Pt c/o lower abdominal pain x 4 days, worse today. Also reports vaginal irritation. Denies bleeding. Pt currently 16-[redacted] weeks pregnant. G7, A2, L4. Hx of c-section.

## 2020-05-15 NOTE — Discharge Instructions (Addendum)
Please follow-up with your OB/GYN.  You may take Tylenol 1000 mg every 6 hours as needed for pain.  Your labs, urine and pelvic swabs today were unremarkable.  Ultrasound of your fetus appeared normal.
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
## 2020-05-20 ENCOUNTER — Telehealth: Payer: Self-pay

## 2020-05-20 NOTE — Telephone Encounter (Signed)
Pt returned call; thinks it's a yeast inf.  Adv to use monistat 3d or 7d; if doesn't help to call for appt and do not use med the night before.

## 2020-05-20 NOTE — Telephone Encounter (Signed)
Pt calling; private area is irritated; is pregnant.  [phone]  [facility]

## 2020-05-23 NOTE — Telephone Encounter (Signed)
Pt calling; is still irritated; rx or appt to be seen?  [phone]

## 2020-05-24 NOTE — Telephone Encounter (Signed)
Called and spoke with patient about scheduling an appointment for today. Patient states she got over the counter monstat and is feeling a little better, Doesn't need a appointment at this time

## 2020-05-27 ENCOUNTER — Ambulatory Visit: Payer: Medicaid Other

## 2020-05-27 ENCOUNTER — Encounter: Payer: Medicaid Other | Admitting: Obstetrics and Gynecology

## 2020-05-27 DIAGNOSIS — O0992 Supervision of high risk pregnancy, unspecified, second trimester: Secondary | ICD-10-CM

## 2020-05-31 ENCOUNTER — Other Ambulatory Visit: Payer: Self-pay | Admitting: Obstetrics and Gynecology

## 2020-05-31 DIAGNOSIS — O285 Abnormal chromosomal and genetic finding on antenatal screening of mother: Secondary | ICD-10-CM

## 2020-06-02 ENCOUNTER — Encounter: Payer: Self-pay | Admitting: Maternal & Fetal Medicine

## 2020-06-02 ENCOUNTER — Other Ambulatory Visit: Payer: Self-pay | Admitting: Obstetrics and Gynecology

## 2020-06-02 ENCOUNTER — Other Ambulatory Visit: Payer: Self-pay

## 2020-06-02 ENCOUNTER — Ambulatory Visit: Payer: Medicaid Other | Attending: Maternal & Fetal Medicine

## 2020-06-02 ENCOUNTER — Ambulatory Visit (HOSPITAL_BASED_OUTPATIENT_CLINIC_OR_DEPARTMENT_OTHER): Payer: Medicaid Other | Admitting: Maternal & Fetal Medicine

## 2020-06-02 DIAGNOSIS — O34219 Maternal care for unspecified type scar from previous cesarean delivery: Secondary | ICD-10-CM | POA: Diagnosis not present

## 2020-06-02 DIAGNOSIS — O99212 Obesity complicating pregnancy, second trimester: Secondary | ICD-10-CM | POA: Diagnosis not present

## 2020-06-02 DIAGNOSIS — O99332 Smoking (tobacco) complicating pregnancy, second trimester: Secondary | ICD-10-CM | POA: Diagnosis not present

## 2020-06-02 DIAGNOSIS — O365920 Maternal care for other known or suspected poor fetal growth, second trimester, not applicable or unspecified: Secondary | ICD-10-CM

## 2020-06-02 DIAGNOSIS — O321XX0 Maternal care for breech presentation, not applicable or unspecified: Secondary | ICD-10-CM

## 2020-06-02 DIAGNOSIS — O99891 Other specified diseases and conditions complicating pregnancy: Secondary | ICD-10-CM

## 2020-06-02 DIAGNOSIS — I10 Essential (primary) hypertension: Secondary | ICD-10-CM

## 2020-06-02 DIAGNOSIS — O10013 Pre-existing essential hypertension complicating pregnancy, third trimester: Secondary | ICD-10-CM

## 2020-06-02 DIAGNOSIS — O285 Abnormal chromosomal and genetic finding on antenatal screening of mother: Secondary | ICD-10-CM | POA: Insufficient documentation

## 2020-06-02 DIAGNOSIS — Q211 Atrial septal defect: Secondary | ICD-10-CM

## 2020-06-02 DIAGNOSIS — Z6841 Body Mass Index (BMI) 40.0 and over, adult: Secondary | ICD-10-CM

## 2020-06-02 DIAGNOSIS — O365921 Maternal care for other known or suspected poor fetal growth, second trimester, fetus 1: Secondary | ICD-10-CM

## 2020-06-02 DIAGNOSIS — Z7289 Other problems related to lifestyle: Secondary | ICD-10-CM

## 2020-06-02 DIAGNOSIS — Q2112 Patent foramen ovale: Secondary | ICD-10-CM

## 2020-06-02 DIAGNOSIS — Z3A19 19 weeks gestation of pregnancy: Secondary | ICD-10-CM | POA: Insufficient documentation

## 2020-06-02 DIAGNOSIS — O10912 Unspecified pre-existing hypertension complicating pregnancy, second trimester: Secondary | ICD-10-CM | POA: Insufficient documentation

## 2020-06-02 DIAGNOSIS — Z3A09 9 weeks gestation of pregnancy: Secondary | ICD-10-CM

## 2020-06-02 NOTE — Consult Note (Signed)
MFM Consultation   Requesting provider: Zipporah Plants, CNM Date of Service: 06/02/20 Reason for request: Chronic hypertension and maternal history of suspected ASD and PFO  Ashley Howell is a 30 yo G7P4 who is here in consultation at the request o Zipporah Plants.  She is has an EDD of 10/25/20 based on a sure LMP and confirmed by a 9w 4 d exam with EDD of 10/29/20.  She had a low risk NIPT and positive screen for SMA. She had genetic counseling and declined additional testing. She had a normal 1hr GTT as well.  Ashley Howell pregnancy related issues include: 1) Suspected history of ASD and PFO however, follow up echo with bubble study conveyed a low suspicion of a PFO with no evidence of ASD. She was last seen by Ashley Shields, Ashley Howell on 05/06/20 This studies were performed in 2020. Ashley Howell is without symptoms. There was no further indication for a repeat echocardiogram at this time per Ashley Howell. Lastly, there is consideration for changing therapeutic class for management of hypertension.   2) Chronic hypertension Ashley Howell has known chronic hypertension managed by Norvasc 5 mg daily. Her blood pressure is normal today. She she had normal baseline labs for CBC, CMP and UPC. She is also talking low dose ASA daily for preeclampsia prevention.   3) Four prior cesarean deliveries. She had four prior cesarean deliveries without complications. Her most previous pregnancy was complicated by gestational hypertension.  Vitals with BMI 06/02/2020 05/15/2020 05/14/2020  Height 5\' 7"  - -  Weight 286 lbs 8 oz - -  BMI 44.86 - -  Systolic 135 128  Diastolic 78 83 79  Pulse 92 92 88  Some encounter information is confidential and restricted. Go to Review Flowsheets activity to see all data.   CBC Latest Ref Rng & Units 05/14/2020 03/16/2020 09/27/2019  WBC 4.0 - 10.5 K/uL 10.4 9.8 10.5  Hemoglobin 12.0 - 15.0 g/dL 11.9(L) 13.4 13.3  Hematocrit 36.0 - 46.0 % 36.0 40.5 40.5  Platelets 150 - 400 K/uL 288  353 365   CMP Latest Ref Rng & Units 05/14/2020 03/16/2020 05/19/2019  Glucose 70 - 99 mg/dL 07/17/2019) 98 710(G)  BUN 6 - 20 mg/dL 6 11 10   Creatinine 0.44 - 1.00 mg/dL 269(S 8.54  Sodium 135 - 145 mmol/L 134(L) 137 140  Potassium 3.5 - 5.1 mmol/L 3.1(L) 4.0 3.4(L)  Chloride 98 - 111 mmol/L 107 102 107  CO2 22 - 32 mmol/L 20(L) 21 25  Calcium 8.9 - 10.3 mg/dL 8.9 6.27 9.2  Total Protein 6.5 - 8.1 g/dL 6.9 7.2 7.6  Total Bilirubin 0.3 - 1.2 mg/dL 0.3 0.35 0.8  Alkaline Phos 38 - 126 U/L 54 71 72  AST 15 - 41 U/L 19 15 21   ALT 0 - 44 U/L 18 23 26    Past Medical History:  Diagnosis Date  . Anxiety   . Benign essential hypertension, antepartum 05/19/2018   160/99 at anatomy scan 18 weeks   Pt sent to ER on 05/15/2018 resolved with rest   Elevated at u/s visit - labetalol 100 mg bid initiated  . Hypertension   . PFO (patent foramen ovale)    a. 12/2014 Echo: EF 55-60%, mildly dil LA. PFO w/o significant shunting (reviewed by Dr. 07/2018).  . Shortness of breath dyspnea    Past Surgical History:  Procedure Laterality Date  . CESAREAN SECTION  10/28/2008  . CESAREAN SECTION N/A 02/22/2015   Procedure: CESAREAN SECTION;  Surgeon:  Nadara Mustard, MD;  Location: [facility] ORS;  Service: Obstetrics;  Laterality: N/A;  . CESAREAN SECTION  04/13/2010  . CESAREAN SECTION N/A 10/09/2018   Procedure: CESAREAN SECTION;  Surgeon: Natale Milch, MD;  Location: [facility] ORS;  Service: Obstetrics;  Laterality: N/A;   Social History   Socioeconomic History  . Marital status: Single    Spouse name: Not on file  . Number of children: 4  . Years of education: Not on file  . Highest education level: Not on file  Occupational History  . Occupation: CNA  Tobacco Use  . Smoking status: Current Some Day Smoker    Packs/day: 0.25    Years: 2.00    Pack years: 0.50    Types: Cigarettes  . Smokeless tobacco: Never Used  Vaping Use  . Vaping Use: Never used  Substance and Sexual Activity  . Alcohol  use: No  . Drug use: No  . Sexual activity: Yes    Birth control/protection: None  Other Topics Concern  . Not on file  Social History Narrative  . Not on file   Social Determinants of Health   Financial Resource Strain: Not on file  Food Insecurity: Not on file  Transportation Needs: Not on file  Physical Activity: Not on file  Stress: Not on file  Social Connections: Not on file  Intimate Partner Violence: Not on file   Family History  Problem Relation Age of Onset  . Hypertension Mother   . Stroke Paternal Aunt   . Cancer Maternal Grandfather        Colon     Current Outpatient Medications (Cardiovascular):  .  amLODipine (NORVASC) 5 MG tablet, Take 1 tablet (5 mg total) by mouth daily.   Current Outpatient Medications (Analgesics):  .  aspirin 81 MG chewable tablet, Chew 81 mg by mouth daily.   Current Outpatient Medications (Other):  Marland Kitchen  Prenat w/o A-FeCbGl-DSS-FA-DHA (CITRANATAL ASSURE) 35-1 & 300 MG tablet, Take 1 tablet by mouth daily.  Imaging: Single intrauterine pregnancy here for a detailed anatomy with good dates. She has history of possible ASD/PFO, tobacco use and chronic hypertension. (An MFM consultation was performed please see note for details.) Normal anatomy with measurements less than dates due to IUGR EFW 8th%.  There is good fetal movement and amniotic fluid volume The UA Dopplers are normal with no evidence of AEDF or REDF.  Suboptimal views of the fetal anatomy was obtained secondary to fetal position and maternal habitus.   Impression/Counseling:  1) History of maternal possible PFO and ASD.  This has been generally ruled out by cardiology. At this time no further testing is recommended. In addition, we discussed the option of a fetal echocardiogram, which she will await for the next exam to determine whether she will pursue this study.   2) Chronic hypertension:   She is taking Norvasc- I agree that a more known therapeutic class like  procardia would be preferred at 30 mgXL. Her blood pressure and labs are stable. We discussed the increased risk for preeclampsia, FGR, abruption and stillbirth.   Given these risk we recommend serial growth every 4 week with weekly testing to be initiated at 32 weeks.    3) Prior cesarean delivery She had 4 prior cesarean deliveries. Today there is an anterior placenta that appears normally adhered. She plans on a repeat delivery with BTL at the conclusion of this pregnancy.    4) NEW IUGR I discussed today's visit with a diagnosis of  IUGR. I explained that the etiology includes placental insufficiency, chronic disease, infection, aneuploidy, smoking and other genetic syndromes. 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 She smokes approximately 1 ppd. She is also diagnosed with chronic hypertension managed by Norvasc 5 mg.At this time I explained the diagnosis, evaluation and management to include on going fetal growth and weekly antenatal testing to include UA Dopplers. If the EFW < 3rd% or abnormal testing, I recommend delivery at 37 weeks otherwise if all is normal consider delivery at 39 weeks.   She is schedule for repeat growth in 4 weeks with UA Dopplers.  Lastly, we discussed smoking cessation and she is committed to quitting. She also has an elevated BMI with a normal early 1hr GTT. We discussed TWG of 11-20 lbs.   All questions answered.  I spent 45 minutes with > 50% in face to face consultation  Rihana Kiddy J. Grace Bushy.

## 2020-06-06 ENCOUNTER — Ambulatory Visit: Payer: Medicaid Other

## 2020-06-06 ENCOUNTER — Encounter: Payer: Self-pay | Admitting: Obstetrics and Gynecology

## 2020-06-06 ENCOUNTER — Encounter: Payer: Medicaid Other | Admitting: Obstetrics and Gynecology

## 2020-06-06 ENCOUNTER — Ambulatory Visit (INDEPENDENT_AMBULATORY_CARE_PROVIDER_SITE_OTHER): Payer: Medicaid Other | Admitting: Obstetrics and Gynecology

## 2020-06-06 ENCOUNTER — Other Ambulatory Visit: Payer: Self-pay

## 2020-06-06 VITALS — BP 130/70 | HR 81 | Wt 287.0 lb

## 2020-06-06 DIAGNOSIS — I10 Essential (primary) hypertension: Secondary | ICD-10-CM

## 2020-06-06 DIAGNOSIS — O0992 Supervision of high risk pregnancy, unspecified, second trimester: Secondary | ICD-10-CM

## 2020-06-06 DIAGNOSIS — O99210 Obesity complicating pregnancy, unspecified trimester: Secondary | ICD-10-CM

## 2020-06-06 DIAGNOSIS — O34219 Maternal care for unspecified type scar from previous cesarean delivery: Secondary | ICD-10-CM

## 2020-06-06 MED ORDER — NIFEDIPINE ER OSMOTIC RELEASE 30 MG PO TB24
30.0000 mg | ORAL_TABLET | Freq: Every day | ORAL | 6 refills | Status: DC
Start: 2020-06-06 — End: 2020-08-03

## 2020-06-06 NOTE — Patient Instructions (Signed)
Managing the Challenge of Quitting Smoking Quitting smoking is a physical and mental challenge. You will face cravings, withdrawal symptoms, and temptation. Before quitting, work with your health care provider to make a plan that can help you manage quitting. Preparation can help you quit and keep you from giving in. How to manage lifestyle changes Managing stress Stress can make you want to smoke, and wanting to smoke may cause stress. It is important to find ways to manage your stress. You might try some of the following:  Practice relaxation techniques. ? Breathe slowly and deeply, in through your nose and out through your mouth. ? Listen to music. ? Soak in a bath or take a shower. ? Imagine a peaceful place or vacation.  Get some support. ? Talk with family or friends about your stress. ? Join a support group. ? Talk with a counselor or therapist.  Get some physical activity. ? Go for a walk, run, or bike ride. ? Play a favorite sport. ? Practice yoga.   Medicines Talk with your health care provider about medicines that might help you deal with cravings and make quitting easier for you. Relationships Social situations can be difficult when you are quitting smoking. To manage this, you can:  Avoid parties and other social situations where people might be smoking.  Avoid alcohol.  Leave right away if you have the urge to smoke.  Explain to your family and friends that you are quitting smoking. Ask for support and let them know you might be a bit grumpy.  Plan activities where smoking is not an option. General instructions Be aware that many people gain weight after they quit smoking. However, not everyone does. To keep from gaining weight, have a plan in place before you quit and stick to the plan after you quit. Your plan should include:  Having healthy snacks. When you have a craving, it may help to: ? Eat popcorn, carrots, celery, or other cut vegetables. ? Chew  sugar-free gum.  Changing how you eat. ? Eat small portion sizes at meals. ? Eat 4-6 small meals throughout the day instead of 1-2 large meals a day. ? Be mindful when you eat. Do not watch television or do other things that might distract you as you eat.  Exercising regularly. ? Make time to exercise each day. If you do not have time for a long workout, do short bouts of exercise for 5-10 minutes several times a day. ? Do some form of strengthening exercise, such as weight lifting. ? Do some exercise that gets your heart beating and causes you to breathe deeply, such as walking fast, running, swimming, or biking. This is very important.  Drinking plenty of water or other low-calorie or no-calorie drinks. Drink 6-8 glasses of water daily.   How to recognize withdrawal symptoms Your body and mind may experience discomfort as you try to get used to not having nicotine in your system. These effects are called withdrawal symptoms. They may include:  Feeling hungrier than normal.  Having trouble concentrating.  Feeling irritable or restless.  Having trouble sleeping.  Feeling depressed.  Craving a cigarette. To manage withdrawal symptoms:  Avoid places, people, and activities that trigger your cravings.  Remember why you want to quit.  Get plenty of sleep.  Avoid coffee and other caffeinated drinks. These may worsen some of your symptoms. These symptoms may surprise you. But be assured that they are normal to have when quitting smoking. How to manage cravings   Come up with a plan for how to deal with your cravings. The plan should include the following:  A definition of the specific situation you want to deal with.  An alternative action you will take.  A clear idea for how this action will help.  The name of someone who might help you with this. Cravings usually last for 5-10 minutes. Consider taking the following actions to help you with your plan to deal with  cravings:  Keep your mouth busy. ? Chew sugar-free gum. ? Suck on hard candies or a straw. ? Brush your teeth.  Keep your hands and body busy. ? Change to a different activity right away. ? Squeeze or play with a ball. ? Do an activity or a hobby, such as making bead jewelry, practicing needlepoint, or working with wood. ? Mix up your normal routine. ? Take a short exercise break. Go for a quick walk or run up and down stairs.  Focus on doing something kind or helpful for someone else.  Call a friend or family member to talk during a craving.  Join a support group.  Contact a quitline. Where to find support To get help or find a support group:  Call the National Cancer Institute's Smoking Quitline: 1-800-QUIT NOW (784-8669)  Visit the website of the Substance Abuse and Mental Health Services Administration: www.samhsa.gov  Text QUIT to SmokefreeTXT: 478848 Where to find more information Visit these websites to find more information on quitting smoking:  National Cancer Institute: www.smokefree.gov  American Lung Association: www.lung.org  American Cancer Society: www.cancer.org  Centers for Disease Control and Prevention: www.cdc.gov  American Heart Association: www.heart.org Contact a health care provider if:  You want to change your plan for quitting.  The medicines you are taking are not helping.  Your eating feels out of control or you cannot sleep. Get help right away if:  You feel depressed or become very anxious. Summary  Quitting smoking is a physical and mental challenge. You will face cravings, withdrawal symptoms, and temptation to smoke again. Preparation can help you as you go through these challenges.  Try different techniques to manage stress, handle social situations, and prevent weight gain.  You can deal with cravings by keeping your mouth busy (such as by chewing gum), keeping your hands and body busy, calling family or friends, or  contacting a quitline for people who want to quit smoking.  You can deal with withdrawal symptoms by avoiding places where people smoke, getting plenty of rest, and avoiding drinks with caffeine. This information is not intended to replace advice given to you by your health care provider. Make sure you discuss any questions you have with your health care provider. Document Revised: 01/13/2019 Document Reviewed: 01/13/2019 Elsevier Patient Education  2021 Elsevier Inc.  

## 2020-06-06 NOTE — Progress Notes (Addendum)
Routine Prenatal Care Visit  Subjective  Ashley Howell is a 30 y.o. [id number] at [redacted]w[redacted]d being seen today for ongoing prenatal care.  She is currently monitored for the following issues for this high-risk pregnancy and has History of cardiomegaly; Previous cesarean delivery, antepartum condition or complication; Obesity in pregnancy, antepartum; Chronic hypertension; PFO (patent foramen ovale); BMI 40.0-44.9, adult (HCC); Previous cesarean delivery, antepartum; Dysplasia of cervix, low grade (CIN 1); Bacterial vaginosis; Supervision of high-risk pregnancy, second trimester; and ASD (atrial septal defect) on their problem list.  ----------------------------------------------------------------------------------- Patient reports no complaints. Reports she was able to stop smoking.   Contractions: Not present. Vag. Bleeding: None.  Movement: Present. Denies leaking of fluid.  ----------------------------------------------------------------------------------- The following portions of the patient's history were reviewed and updated as appropriate: allergies, current medications, past family history, past medical history, past social history, past surgical history and problem list. Problem list updated.   Objective  Blood pressure 130/70, pulse 81, weight 287 lb (130.2 kg), last menstrual period 01/19/2020, not currently breastfeeding. Pregravid weight 280 lb (127 kg) Total Weight Gain 7 lb (3.175 kg) Urinalysis:      Fetal Status: Fetal Heart Rate (bpm): 150   Movement: Present     General:  Alert, oriented and cooperative. Patient is in no acute distress.  Skin: Skin is warm and dry. No rash noted.   Cardiovascular: Normal heart rate noted  Respiratory: Normal respiratory effort, no problems with respiration noted  Abdomen: Soft, gravid, appropriate for gestational age. Pain/Pressure: Absent     Pelvic:  Cervical exam deferred        Extremities: Normal range of motion.     Mental  Status: Normal mood and affect. Normal behavior. Normal judgment and thought content.     Assessment   30 y.o. [id number] at [redacted]w[redacted]d by  10/25/2020, by Last Menstrual Period presenting for routine prenatal visit  Plan   pregnancy6 Problems (from 01/19/20 to present)    Problem Noted Resolved   Supervision of high-risk pregnancy, second trimester 03/16/2020 by Zipporah Plants, CNM No   Overview Addendum 06/06/2020 12:32 PM by Natale Milch, MD     Nursing Staff Provider  Office Location  [facility] Dating   LMP = 10w [place]  Language  Spanish Anatomy US   with MFM - IUGR  Flu Vaccine   UTD (through employer) Genetic Screen  NIPS:   Negx3, XY  TDaP vaccine    Hgb A1C or  GTT Early : 100 Third trimester :   Rhogam   n/a   LAB RESULTS   Feeding Plan  Blood Type O/Positive/-- (12/08 1432)   Contraception  BTL Antibody Negative (12/08 1432)  Circumcision  Rubella <0.90 (12/08 1432)  Pediatrician   RPR Non Reactive (12/08 1432)   Support Person  Charles HBsAg Negative (12/08 1432)   Prenatal Classes  HIV Non Reactive (12/08 1432)  Covid vaccine  completed x2 Varicella  immune  BTL Consent  desires GBS  (For PCN allergy, check sensitivities)        VBAC Consent  n/a -repeat cesarean Pap      Hgb Electro      CF  negative     SMA  at risk - genetic counseling referral placed        High Risk Pregnancy Diagnoses  Hx of ASD/PFO Hx of cesarean delivery x4 Maternal obesity - pregravid BMI >40 cHTN - nifedipine  Hx of cardiomegaly  IUGR - noted at 19 weeks- following with  MFM - see recs Tobacco use in pregnancy- quit 06/02/2020      Previous Version   Chronic hypertension 05/19/2018 by Jimmey Ralph, MD No   Overview Addendum 04/27/2020 10:46 AM by Zipporah Plants, CNM    [x]  Aspirin 81 mg daily after 12 weeks; discontinue after 36 weeks [x]  baseline labs with CBC, CMP, urine protein/creatinine ratio [ ]  no BP meds unless BPs become elevated - controlled with amlodipine 5 mg [ ]   ultrasound for growth at 28, 32, 36 weeks [x]  Baseline EKG - cards referral 12/8  Current antihypertensives:  amlodipine 5 mg Baseline and surveillance labs (pulled in from [facility], refresh links as needed)  Lab Results  Component Value Date   PLT 365 09/27/2019   CREATININE 0.70 05/19/2019   AST 21 05/19/2019   ALT 26 05/19/2019   PROTCRRATIO 0.14 10/09/2018    Antenatal Testing CHTN - O10.919  Group I  BP < 140/90, no preeclampsia, AGA,  nml AFV, +/- meds    Group II BP > 140/90, on meds, no preeclampsia, AGA, nml AFV  20-28-34-38  20-24-28-32-35-38  32//2 x wk  28//BPP wkly then 32//2 x wk  40 no meds; 39 meds  PRN or 37  Pre-eclampsia  GHTN - O13.9/Preeclampsia without severe features  - O14.00   Preeclampsia with severe features - O14.10  Q 3-4wks  Q 2 wks  28//BPP wkly then 32//2 x wk  Inpatient  37  PRN or 34         Previous Version   Obesity in pregnancy, antepartum 02/15/2015 by 12/10/2018, CNM No   Overview Signed 03/16/2020  2:30 PM by [birth year]1971, CNM    BMI >=40 [x]  early 1h gtt - ordered  [ ]  screen sleep apnea [x]  u/s for dating [ ]   [x]  nutritional goals [x ] folic acid 1mg  [ ]  bASA (>12 weeks) [ ]  consider nutrition consult [ ]  consider maternal EKG 1st trimester - cards referral 12/8 [ ]  Growth u/s 28 [ ] , 32 [ ] , 36 weeks [ ]  [ ]  NST/AFI weekly 34+ weeks (34[] ,35[] ,36[] , 37[] , 38[] , 39[] , 40[] ) [ ]  IOL by 41 weeks (scheduled, prn [] )          Following with growth 13/11/2014 for IUGR.  Congratulated for stopping smoking.  Immunization UTD.  Tubal consents needed next visit Anesthesia referral placed  Gestational age appropriate obstetric precautions including but not limited to vaginal bleeding, contractions, leaking of fluid and fetal movement were reviewed in detail with the patient.    Return in about 2 weeks (around 06/20/2020) for ROB in person with MD.  14/11/2019 MD [facility], [facility] 06/06/2020, 12:32 PM

## 2020-06-20 ENCOUNTER — Encounter: Payer: Medicaid Other | Admitting: Obstetrics and Gynecology

## 2020-06-21 ENCOUNTER — Telehealth: Payer: Self-pay

## 2020-06-21 NOTE — Telephone Encounter (Signed)
LMVM TRC. Generic message as DPR states ok to leave message,but #'s are different that pt current number. Will send my chart message.

## 2020-06-22 NOTE — Telephone Encounter (Signed)
Spoke to patient. Apt r/s to 3/17 at 4:30.

## 2020-06-23 ENCOUNTER — Encounter: Payer: Self-pay | Admitting: Obstetrics and Gynecology

## 2020-06-23 ENCOUNTER — Other Ambulatory Visit: Payer: Self-pay

## 2020-06-23 ENCOUNTER — Ambulatory Visit (INDEPENDENT_AMBULATORY_CARE_PROVIDER_SITE_OTHER): Payer: Medicaid Other | Admitting: Obstetrics and Gynecology

## 2020-06-23 DIAGNOSIS — O0992 Supervision of high risk pregnancy, unspecified, second trimester: Secondary | ICD-10-CM

## 2020-06-23 NOTE — Patient Instructions (Signed)

## 2020-06-23 NOTE — Progress Notes (Signed)
Routine Prenatal Care Visit  Subjective  Ashley Howell is a 30 y.o. [id number] at [redacted]w[redacted]d being seen today for ongoing prenatal care.  She is currently monitored for the following issues for this high-risk pregnancy and has History of cardiomegaly; Previous cesarean delivery, antepartum condition or complication; Obesity in pregnancy, antepartum; Chronic hypertension; PFO (patent foramen ovale); BMI 40.0-44.9, adult (HCC); Previous cesarean delivery, antepartum; Dysplasia of cervix, low grade (CIN 1); Bacterial vaginosis; Supervision of high-risk pregnancy, second trimester; and ASD (atrial septal defect) on their problem list.  ----------------------------------------------------------------------------------- Patient reports no complaints.   Contractions: Not present. Vag. Bleeding: None.  Movement: Present. Denies leaking of fluid.  ----------------------------------------------------------------------------------- The following portions of the patient's history were reviewed and updated as appropriate: allergies, current medications, past family history, past medical history, past social history, past surgical history and problem list. Problem list updated.   Objective  Blood pressure 120/70, weight 290 lb 12.8 oz (131.9 kg), last menstrual period 01/19/2020, not currently breastfeeding. Pregravid weight 280 lb (127 kg) Total Weight Gain 10 lb 12.8 oz (4.899 kg) Urinalysis:      Fetal Status: Fetal Heart Rate (bpm): 140   Movement: Present     General:  Alert, oriented and cooperative. Patient is in no acute distress.  Skin: Skin is warm and dry. No rash noted.   Cardiovascular: Normal heart rate noted  Respiratory: Normal respiratory effort, no problems with respiration noted  Abdomen: Soft, gravid, appropriate for gestational age. Pain/Pressure: Absent     Pelvic:  Cervical exam deferred        Extremities: Normal range of motion.  Edema: None  Mental Status: Normal mood and  affect. Normal behavior. Normal judgment and thought content.     Assessment   29 y.o. [id number] at [redacted]w[redacted]d by  10/25/2020, by Last Menstrual Period presenting for routine prenatal visit  Plan   pregnancy6 Problems (from 01/19/20 to present)    Problem Noted Resolved   Supervision of high-risk pregnancy, second trimester 03/16/2020 by Zipporah Plants, CNM No   Overview Addendum 06/23/2020  4:53 PM by Natale Milch, MD     Nursing Staff Provider  Office Location  [facility] Dating   LMP = 10w [place]  Language  Spanish Anatomy US   with MFM - IUGR  Flu Vaccine   UTD (through employer) Genetic Screen  NIPS:   Negx3, XY  TDaP vaccine    Hgb A1C or  GTT Early : 100 Third trimester :   Rhogam   n/a   LAB RESULTS   Feeding Plan  Blood Type O/Positive/-- (12/08 1432)   Contraception  BTL Antibody Negative (12/08 1432)  Circumcision  Rubella <0.90 (12/08 1432)  Pediatrician   RPR Non Reactive (12/08 1432)   Support Person  Charles HBsAg Negative (12/08 1432)   Prenatal Classes  HIV Non Reactive (12/08 1432)  Covid vaccine  completed x2 Varicella  immune  BTL Consent  desires [x ] consents 06/23/2020 GBS  (For PCN allergy, check sensitivities)        VBAC Consent  n/a -repeat cesarean Pap  NIL 2021    Hgb Electro      CF  negative     SMA  at risk - genetic counseling referral placed        High Risk Pregnancy Diagnoses  Hx of ASD/PFO- small on bubble study Hx of cesarean delivery x4 Maternal obesity - pregravid BMI >40 cHTN - nifedipine 30 mg daily Hx of cardiomegaly  IUGR -  noted at 19 weeks- following with MFM - see recs Tobacco use in pregnancy- quit 06/02/2020      Previous Version   Chronic hypertension 05/19/2018 by Jimmey Ralph, MD No   Overview Addendum 04/27/2020 10:46 AM by Zipporah Plants, CNM    [x]  Aspirin 81 mg daily after 12 weeks; discontinue after 36 weeks [x]  baseline labs with CBC, CMP, urine protein/creatinine ratio [ ]  no BP meds unless BPs become  elevated - controlled with amlodipine 5 mg [ ]  ultrasound for growth at 28, 32, 36 weeks [x]  Baseline EKG - cards referral 12/8  Current antihypertensives:  amlodipine 5 mg Baseline and surveillance labs (pulled in from [facility], refresh links as needed)  Lab Results  Component Value Date   PLT 365 09/27/2019   CREATININE 0.70 05/19/2019   AST 21 05/19/2019   ALT 26 05/19/2019   PROTCRRATIO 0.14 10/09/2018    Antenatal Testing CHTN - O10.919  Group I  BP < 140/90, no preeclampsia, AGA,  nml AFV, +/- meds    Group II BP > 140/90, on meds, no preeclampsia, AGA, nml AFV  20-28-34-38  20-24-28-32-35-38  32//2 x wk  28//BPP wkly then 32//2 x wk  40 no meds; 39 meds  PRN or 37  Pre-eclampsia  GHTN - O13.9/Preeclampsia without severe features  - O14.00   Preeclampsia with severe features - O14.10  Q 3-4wks  Q 2 wks  28//BPP wkly then 32//2 x wk  Inpatient  37  PRN or 34         Previous Version   Obesity in pregnancy, antepartum 02/15/2015 by 12/10/2018, CNM No   Overview Signed 03/16/2020  2:30 PM by 01-[birth year]-1971, CNM    BMI >=40 [x]  early 1h gtt - ordered  [ ]  screen sleep apnea [x]  u/s for dating [ ]   [x]  nutritional goals [x ] folic acid 1mg  [ ]  bASA (>12 weeks) [ ]  consider nutrition consult [ ]  consider maternal EKG 1st trimester - cards referral 12/8 [ ]  Growth u/s 28 [ ] , 32 [ ] , 36 weeks [ ]  [ ]  NST/AFI weekly 34+ weeks (34[] ,35[] ,36[] , 37[] , 38[] , 39[] , 40[] ) [ ]  IOL by 41 weeks (scheduled, prn [] )          Reports appointment with anesthesia is scheduled Tubal consents today Following soon with MFM for growth 13/11/2014 next week.   Gestational age appropriate obstetric precautions including but not limited to vaginal bleeding, contractions, leaking of fluid and fetal movement were reviewed in detail with the patient.    Return in about 3 weeks (around 07/14/2020) for ROB in person.  14/11/2019 MD [facility], [facility] 06/23/2020, 4:53 PM

## 2020-06-25 ENCOUNTER — Other Ambulatory Visit: Payer: Self-pay | Admitting: Obstetrics and Gynecology

## 2020-06-25 DIAGNOSIS — O99212 Obesity complicating pregnancy, second trimester: Secondary | ICD-10-CM

## 2020-06-25 DIAGNOSIS — O10912 Unspecified pre-existing hypertension complicating pregnancy, second trimester: Secondary | ICD-10-CM

## 2020-06-25 DIAGNOSIS — O99332 Smoking (tobacco) complicating pregnancy, second trimester: Secondary | ICD-10-CM

## 2020-06-28 ENCOUNTER — Encounter: Payer: Self-pay | Admitting: Obstetrics and Gynecology

## 2020-06-29 ENCOUNTER — Other Ambulatory Visit: Payer: Self-pay

## 2020-06-29 ENCOUNTER — Other Ambulatory Visit
Admission: RE | Admit: 2020-06-29 | Discharge: 2020-06-29 | Disposition: A | Discharge status: Home / Self Care | Payer: Medicaid Other | Source: Ambulatory Visit | Attending: Obstetrics and Gynecology | Admitting: Obstetrics and Gynecology

## 2020-06-29 NOTE — Consult Note (Signed)
[facility] Anesthesia Consultation  Ashley Howell ZOX:[id number] DOB: [birth year] DOA: 06/29/2020 PCP: [facility]   Requesting physician: Jerene Pitch Date of consultation: 06/29/20 Reason for consultation: Obesity during pregnancy  CHIEF COMPLAINT:  Obesity during pregnancy  HISTORY OF PRESENT ILLNESS: Ashley Howell  is a 30 y.o. female with a known history of  Obesity and  prior C-S' s under successful spinal anesthesia.  PAST MEDICAL HISTORY:   Past Medical History:  Diagnosis Date  . Anxiety   . Benign essential hypertension, antepartum 05/19/2018   160/99 at anatomy scan 18 weeks   Pt sent to ER on 05/15/2018 resolved with rest   Elevated at u/s visit - labetalol 100 mg bid initiated  . Hypertension   . PFO (patent foramen ovale)    a. 12/2014 Echo: EF 55-60%, mildly dil LA. PFO w/o significant shunting (reviewed by Dr. Mariah Milling 07/2018).  . Shortness of breath dyspnea     PAST SURGICAL HISTORY:  Past Surgical History:  Procedure Laterality Date  . CESAREAN SECTION  10/28/2008  . CESAREAN SECTION N/A 02/22/2015   Procedure: CESAREAN SECTION;  Surgeon: Nadara Mustard, MD;  Location: [facility] ORS;  Service: Obstetrics;  Laterality: N/A;  . CESAREAN SECTION  04/13/2010  . CESAREAN SECTION N/A 10/09/2018   Procedure: CESAREAN SECTION;  Surgeon: Natale Milch, MD;  Location: [facility] ORS;  Service: Obstetrics;  Laterality: N/A;    SOCIAL HISTORY:  Social History   Tobacco Use  . Smoking status: Current Some Day Smoker    Packs/day: 0.25    Years: 2.00    Pack years: 0.50    Types: Cigarettes  . Smokeless tobacco: Never Used  Substance Use Topics  . Alcohol use: No    FAMILY HISTORY:  Family History  Problem Relation Age of Onset  . Hypertension Mother   . Stroke Paternal Aunt   . Cancer Maternal Grandfather        Colon    DRUG ALLERGIES: No Known Allergies  REVIEW OF SYSTEMS:   RESPIRATORY: No  cough, shortness of breath, wheezing.  CARDIOVASCULAR: No chest pain, orthopnea, edema.  HEMATOLOGY: No anemia, easy bruising or bleeding SKIN: No rash or lesion. NEUROLOGIC: No tingling, numbness, weakness.  PSYCHIATRY: No anxiety or depression.   MEDICATIONS AT HOME:  Prior to Admission medications   Medication Sig Start Date End Date Taking? Authorizing Provider  amLODipine (NORVASC) 5 MG tablet Take 1 tablet (5 mg total) by mouth daily. 09/21/19   Nadara Mustard, MD  aspirin 81 MG chewable tablet Chew 81 mg by mouth daily.    [provider]  NIFEdipine (PROCARDIA-XL/NIFEDICAL-XL) 30 MG 24 hr tablet Take 1 tablet (30 mg total) by mouth daily. Can increase to twice a day as needed for symptomatic contractions 06/06/20   Schuman, Christanna R, MD  Prenat w/o [id number] (CITRANATAL ASSURE) 35-1 & 300 MG tablet Take 1 tablet by mouth daily. 03/16/20   Zipporah Plants, CNM      PHYSICAL EXAMINATION:   VITAL SIGNS: Last menstrual period 01/19/2020, not currently breastfeeding.  GENERAL:  30 y.o.-year-old patient no acute distress.  HEENT: Head atraumatic, normocephalic. Oropharynx and nasopharynx clear. MP 2, TM distance >3 cm, normal mouth opening. LUNGS: No use of accessory muscles of respiration.   EXTREMITIES: No pedal edema, cyanosis, or clubbing.  NEUROLOGIC: normal gait PSYCHIATRIC: The patient is alert and oriented x  3.  SKIN: No obvious rash, lesion, or ulcer.  Back:  Reasonable anatomy with palpable interspaces and prior Hx of successful spinals for C-section.  Last C-S may have been complicated by a positional headache, but patient still prefers regional block for OB care and patient understands risks and accepts the risks.  IMPRESSION AND PLAN:   Ashley Howell  is a 30 y.o. female presenting with obesity during pregnancy. BMI is currently 45.3 with an approximate due date of July 19th.  We discussed analgesic options during labor including epidural  analgesia. Discussed that in obesity there can be increased difficulty with epidural placement or even failure of successful epidural. We also discussed that even after successful epidural placement there is increased risk of catheter migration out of the epidural space that would require catheter replacement. Discussed use of epidural vs spinal vs GA if cesarean delivery is required. Discussed increased risk of difficult intubation during pregnancy should an emergency cesarean delivery be required.   We discussed repeat evaluation at 35-36 weeks by anesthesia to determine whether there is a high risk of complications of anesthesia for which we would recommend transfer of OB care to a facility with a higher maternal level of care designation.

## 2020-06-30 ENCOUNTER — Ambulatory Visit: Payer: Medicaid Other | Attending: Maternal & Fetal Medicine

## 2020-06-30 ENCOUNTER — Ambulatory Visit: Payer: Medicaid Other

## 2020-06-30 ENCOUNTER — Other Ambulatory Visit: Payer: Self-pay | Admitting: Obstetrics and Gynecology

## 2020-06-30 ENCOUNTER — Other Ambulatory Visit: Payer: Self-pay

## 2020-06-30 DIAGNOSIS — O99212 Obesity complicating pregnancy, second trimester: Secondary | ICD-10-CM | POA: Diagnosis not present

## 2020-06-30 DIAGNOSIS — O10912 Unspecified pre-existing hypertension complicating pregnancy, second trimester: Secondary | ICD-10-CM | POA: Diagnosis not present

## 2020-06-30 DIAGNOSIS — F17200 Nicotine dependence, unspecified, uncomplicated: Secondary | ICD-10-CM | POA: Insufficient documentation

## 2020-06-30 DIAGNOSIS — E669 Obesity, unspecified: Secondary | ICD-10-CM | POA: Diagnosis not present

## 2020-06-30 DIAGNOSIS — O365920 Maternal care for other known or suspected poor fetal growth, second trimester, not applicable or unspecified: Secondary | ICD-10-CM | POA: Insufficient documentation

## 2020-06-30 DIAGNOSIS — O99332 Smoking (tobacco) complicating pregnancy, second trimester: Secondary | ICD-10-CM

## 2020-06-30 DIAGNOSIS — Z3A23 23 weeks gestation of pregnancy: Secondary | ICD-10-CM | POA: Diagnosis not present

## 2020-06-30 NOTE — Procedures (Deleted)
No note

## 2020-07-03 ENCOUNTER — Observation Stay
Admission: EM | Admit: 2020-07-03 | Discharge: 2020-07-03 | Disposition: A | Discharge status: Home / Self Care | Payer: Medicaid Other | Attending: Obstetrics and Gynecology | Admitting: Obstetrics and Gynecology

## 2020-07-03 ENCOUNTER — Encounter: Payer: Self-pay | Admitting: Obstetrics

## 2020-07-03 DIAGNOSIS — Z87891 Personal history of nicotine dependence: Secondary | ICD-10-CM | POA: Diagnosis not present

## 2020-07-03 DIAGNOSIS — Z79899 Other long term (current) drug therapy: Secondary | ICD-10-CM | POA: Diagnosis not present

## 2020-07-03 DIAGNOSIS — Z7982 Long term (current) use of aspirin: Secondary | ICD-10-CM | POA: Insufficient documentation

## 2020-07-03 DIAGNOSIS — O99210 Obesity complicating pregnancy, unspecified trimester: Secondary | ICD-10-CM

## 2020-07-03 DIAGNOSIS — I10 Essential (primary) hypertension: Secondary | ICD-10-CM

## 2020-07-03 DIAGNOSIS — O368120 Decreased fetal movements, second trimester, not applicable or unspecified: Secondary | ICD-10-CM | POA: Diagnosis not present

## 2020-07-03 DIAGNOSIS — O132 Gestational [pregnancy-induced] hypertension without significant proteinuria, second trimester: Secondary | ICD-10-CM | POA: Diagnosis not present

## 2020-07-03 DIAGNOSIS — O368121 Decreased fetal movements, second trimester, fetus 1: Principal | ICD-10-CM | POA: Insufficient documentation

## 2020-07-03 DIAGNOSIS — Z3A23 23 weeks gestation of pregnancy: Secondary | ICD-10-CM | POA: Diagnosis not present

## 2020-07-03 DIAGNOSIS — O0992 Supervision of high risk pregnancy, unspecified, second trimester: Secondary | ICD-10-CM

## 2020-07-03 NOTE — Discharge Summary (Addendum)
Please see Final progress note.  Mirna Mires, CNM  07/03/2020 3:04 PM

## 2020-07-03 NOTE — Discharge Summary (Signed)
Final Progress Note  Patient ID: Ashley Howell MRN: [id number] DOB/AGE: Feb 23, 1991 30 y.o.  Admit date: 07/03/2020 Admitting provider: Mirna Mires, CNM Discharge date: 07/03/2020   Admission Diagnoses: decreased fetal movement at [redacted] weeks gestation of pregnancy  Discharge Diagnoses:  Active Problems:   Decreased fetal movement affecting management of pregnancy in second trimester  Reassuring fetal heart tones Positive fetal movement  History of Present Illness: The patient is a 30 y.o. female [phone] at [redacted]w[redacted]d who presents for decreased fetal movement. She woke up late in the day and shares not feeling any fetal movement. She had not eaten since yesterday afternoon when she made note that there was not fetal movement. Ashley Howell then drank some cold fluids and ate a snack.she is stillnot aware of fetal movement so she cam to the hospital.   Past Medical History:  Diagnosis Date  . Anxiety   . Benign essential hypertension, antepartum 05/19/2018   160/99 at anatomy scan 18 weeks   Pt sent to ER on 05/15/2018 resolved with rest   Elevated at u/s visit - labetalol 100 mg bid initiated  . Hypertension   . PFO (patent foramen ovale)    a. 12/2014 Echo: EF 55-60%, mildly dil LA. PFO w/o significant shunting (reviewed by Dr. Mariah Milling 07/2018).  . Shortness of breath dyspnea     Past Surgical History:  Procedure Laterality Date  . CESAREAN SECTION  10/28/2008  . CESAREAN SECTION N/A 02/22/2015   Procedure: CESAREAN SECTION;  Surgeon: Nadara Mustard, MD;  Location: [facility] ORS;  Service: Obstetrics;  Laterality: N/A;  . CESAREAN SECTION  04/13/2010  . CESAREAN SECTION N/A 10/09/2018   Procedure: CESAREAN SECTION;  Surgeon: Natale Milch, MD;  Location: [facility] ORS;  Service: Obstetrics;  Laterality: N/A;    No current facility-administered medications on file prior to encounter.   Current Outpatient Medications on File Prior to Encounter  Medication Sig Dispense Refill  . aspirin  81 MG chewable tablet Chew 81 mg by mouth daily.    Marland Kitchen NIFEdipine (PROCARDIA-XL/NIFEDICAL-XL) 30 MG 24 hr tablet Take 1 tablet (30 mg total) by mouth daily. Can increase to twice a day as needed for symptomatic contractions 30 tablet 6  . Prenat w/o A-[id number] (CITRANATAL ASSURE) 35-1 & 300 MG tablet Take 1 tablet by mouth daily. 60 each 3    No Known Allergies  Social History   Socioeconomic History  . Marital status: Single    Spouse name: Not on file  . Number of children: 4  . Years of education: Not on file  . Highest education level: Not on file  Occupational History  . Occupation: CNA  Tobacco Use  . Smoking status: Former Smoker    Packs/day: 0.25    Years: 2.00    Pack years: 0.50    Types: Cigarettes  . Smokeless tobacco: Never Used  Vaping Use  . Vaping Use: Never used  Substance and Sexual Activity  . Alcohol use: No  . Drug use: No  . Sexual activity: Yes    Birth control/protection: None  Other Topics Concern  . Not on file  Social History Narrative  . Not on file   Social Determinants of Health   Financial Resource Strain: Not on file  Food Insecurity: Not on file  Transportation Needs: Not on file  Physical Activity: Not on file  Stress: Not on file  Social Connections: Not on file  Intimate Partner Violence: Not on file    Family History  Problem Relation Age of Onset  . Hypertension Mother   . Stroke Paternal Aunt   . Cancer Maternal Grandfather        Colon     Review of Systems  Constitutional: Negative.   HENT: Negative.   Eyes: Negative.   Respiratory: Negative.   Cardiovascular: Negative.   Gastrointestinal: Negative.   Genitourinary: Negative.   Musculoskeletal: Negative.   Skin: Negative.   Neurological: Negative.   Endo/Heme/Allergies: Negative.   Psychiatric/Behavioral: Negative.      Physical Exam: LMP 01/19/2020   Physical Exam Constitutional:      Appearance: Normal appearance. She is obese.   Genitourinary:     Genitourinary Comments: Exam deferred fhts auscultated by RN and CNM 130s, 140s. Palpable fetal movement.  HENT:     Head: Normocephalic and atraumatic.     Nose: Nose normal.  Cardiovascular:     Rate and Rhythm: Normal rate and regular rhythm.     Pulses: Normal pulses.     Heart sounds: Normal heart sounds.  Pulmonary:     Effort: Pulmonary effort is normal.     Breath sounds: Normal breath sounds.  Abdominal:     Palpations: Abdomen is soft.  Musculoskeletal:        General: Normal range of motion.     Cervical back: Normal range of motion and neck supple.  Neurological:     General: No focal deficit present.     Mental Status: She is alert and oriented to person, place, and time.  Skin:    General: Skin is warm and dry.  Psychiatric:        Mood and Affect: Mood normal.        Behavior: Behavior normal.     Consults: None  Significant Findings/ Diagnostic Studies: fetal heart tones auscultated  Procedures: EFM NST  FHR: 140s beats/min  Fetus too premature for NST testing  Interpretation: Reassuring fetal heart tones    Hospital Course: The patient was admitted to Labor and Delivery Triage for observation. Her OB history was reviewed. She was interviewed regarding her concern, and fetal heart tones were easily auscultated with the doppler from the EFM machine. During the visit, she reported feeling active fetal movement.  Discharge Condition: good  Disposition: Discharge disposition: 01-Home or Self Care     Home  Diet: Regular diet  Discharge Activity: Activity as tolerated. She is reminded to avoid fasting for extended periods of time as she did from yesterday afternoon through noon today. Suggestions to elicit fetal movement also made should she be concerned in the future. She will f/u at her next OB appointment.  Discharge Instructions    Discharge activity:  No Restrictions   Complete by: As directed    Discharge diet:  No  restrictions   Complete by: As directed    Notify physician for a general feeling that "something is not right"   Complete by: As directed    Notify physician for increase or change in vaginal discharge   Complete by: As directed    Notify physician for intestinal cramps, with or without diarrhea, sometimes described as "gas pain"   Complete by: As directed    Notify physician for leaking of fluid   Complete by: As directed    Notify physician for low, dull backache, unrelieved by heat or Tylenol   Complete by: As directed    Notify physician for menstrual like cramps   Complete by: As directed    Notify physician for  pelvic pressure   Complete by: As directed    Notify physician for uterine contractions.  These may be painless and feel like the uterus is tightening or the baby is  "balling up"   Complete by: As directed    Notify physician for vaginal bleeding   Complete by: As directed    PRETERM LABOR:  Includes any of the follwing symptoms that occur between 20 - [redacted] weeks gestation.  If these symptoms are not stopped, preterm labor can result in preterm delivery, placing your baby at risk   Complete by: As directed      Allergies as of 07/03/2020   No Known Allergies     Medication List    TAKE these medications   aspirin 81 MG chewable tablet Chew 81 mg by mouth daily.   CitraNatal Assure 35-1 & 300 MG tablet Take 1 tablet by mouth daily.   NIFEdipine 30 MG 24 hr tablet Commonly known as: PROCARDIA-XL/NIFEDICAL-XL Take 1 tablet (30 mg total) by mouth daily. Can increase to twice a day as needed for symptomatic contractions       Follow-up Information    [facility] Follow up.   Contact information: [address] [place] [address] [phone]              Total time spent taking care of this patient: 20 minutes  Signed: Mirna Mires, CNM  07/03/2020, 3:03 PM

## 2020-07-03 NOTE — Discharge Instructions (Signed)
Please keep your next scheduled appointment.  If you have questions or concerns you may call the on call provider.  If you have urgent concerns please go to nearest emergency department for evaluation.

## 2020-07-03 NOTE — OB Triage Note (Signed)
Patient to OBS 3 with complaint of decreased fetal movement.  [place] applied and fetal heart tones noted with positive fetal movement felt and also reported by the patient. Paula Compton CNM in to see patient and assess fetal heart tones.  Patient reassured and to be discharged home.  Next OB appointment as previously scheduled on 07/12/20.

## 2020-07-12 ENCOUNTER — Encounter: Payer: Medicaid Other | Admitting: Obstetrics and Gynecology

## 2020-07-12 ENCOUNTER — Encounter: Payer: Medicaid Other | Admitting: Obstetrics

## 2020-07-13 ENCOUNTER — Other Ambulatory Visit: Payer: Self-pay

## 2020-07-13 ENCOUNTER — Ambulatory Visit (INDEPENDENT_AMBULATORY_CARE_PROVIDER_SITE_OTHER): Payer: Medicaid Other | Admitting: Obstetrics and Gynecology

## 2020-07-13 ENCOUNTER — Encounter: Payer: Self-pay | Admitting: Obstetrics and Gynecology

## 2020-07-13 VITALS — BP 136/84 | Wt 287.0 lb

## 2020-07-13 DIAGNOSIS — O34219 Maternal care for unspecified type scar from previous cesarean delivery: Secondary | ICD-10-CM

## 2020-07-13 DIAGNOSIS — O365990 Maternal care for other known or suspected poor fetal growth, unspecified trimester, not applicable or unspecified: Secondary | ICD-10-CM

## 2020-07-13 DIAGNOSIS — O0992 Supervision of high risk pregnancy, unspecified, second trimester: Secondary | ICD-10-CM

## 2020-07-13 DIAGNOSIS — Z131 Encounter for screening for diabetes mellitus: Secondary | ICD-10-CM

## 2020-07-13 DIAGNOSIS — Z8679 Personal history of other diseases of the circulatory system: Secondary | ICD-10-CM

## 2020-07-13 DIAGNOSIS — Z113 Encounter for screening for infections with a predominantly sexual mode of transmission: Secondary | ICD-10-CM

## 2020-07-13 DIAGNOSIS — O99210 Obesity complicating pregnancy, unspecified trimester: Secondary | ICD-10-CM

## 2020-07-13 DIAGNOSIS — Z3A25 25 weeks gestation of pregnancy: Secondary | ICD-10-CM

## 2020-07-13 NOTE — Progress Notes (Signed)
Routine Prenatal Care Visit  Subjective  Ashley Howell is a 30 y.o. [id number] at [redacted]w[redacted]d being seen today for ongoing prenatal care.  She is currently monitored for the following issues for this high-risk pregnancy and has History of cardiomegaly; Previous cesarean delivery, antepartum condition or complication; Obesity in pregnancy, antepartum; Chronic hypertension; PFO (patent foramen ovale); BMI 40.0-44.9, adult (HCC); Previous cesarean delivery, antepartum; Dysplasia of cervix, low grade (CIN 1); Bacterial vaginosis; Supervision of high-risk pregnancy, second trimester; ASD (atrial septal defect); Decreased fetal movement affecting management of pregnancy in second trimester; and Pregnancy affected by fetal growth restriction on their problem list.  ----------------------------------------------------------------------------------- Patient reports no complaints.   Contractions: Not present. Vag. Bleeding: None.  Movement: Present. Leaking Fluid denies.  ----------------------------------------------------------------------------------- The following portions of the patient's history were reviewed and updated as appropriate: allergies, current medications, past family history, past medical history, past social history, past surgical history and problem list. Problem list updated.  Objective  Blood pressure 136/84, weight 287 lb (130.2 kg), last menstrual period 01/19/2020, not currently breastfeeding. Pregravid weight 280 lb (127 kg) Total Weight Gain 7 lb (3.175 kg) Urinalysis: Urine Protein    Urine Glucose    Fetal Status: Fetal Heart Rate (bpm): 145   Movement: Present     General:  Alert, oriented and cooperative. Patient is in no acute distress.  Skin: Skin is warm and dry. No rash noted.   Cardiovascular: Normal heart rate noted  Respiratory: Normal respiratory effort, no problems with respiration noted  Abdomen: Soft, gravid, appropriate for gestational age. Pain/Pressure: Absent      Pelvic:  Cervical exam deferred        Extremities: Normal range of motion.     Mental Status: Normal mood and affect. Normal behavior. Normal judgment and thought content.   Assessment   30 y.o. [id number] at [redacted]w[redacted]d by  10/25/2020, by Last Menstrual Period presenting for routine prenatal visit  Plan   pregnancy6 Problems (from 01/19/20 to present)    Problem Noted Resolved   Pregnancy affected by fetal growth restriction 07/13/2020 by Conard Novak, MD No   Overview Signed 07/13/2020  4:41 PM by Conard Novak, MD    - followed by MFM      Supervision of high-risk pregnancy, second trimester 03/16/2020 by Zipporah Plants, CNM No   Overview Addendum 06/23/2020  4:53 PM by Natale Milch, MD     Nursing Staff Provider  Office Location  [facility] Dating   LMP = 10w [place]  Language  Spanish Anatomy US   with MFM - IUGR  Flu Vaccine   UTD (through employer) Genetic Screen  NIPS:   Negx3, XY  TDaP vaccine    Hgb A1C or  GTT Early : 100 Third trimester :   Rhogam   n/a   LAB RESULTS   Feeding Plan  Blood Type O/Positive/-- (12/08 1432)   Contraception  BTL Antibody Negative (12/08 1432)  Circumcision  Rubella <0.90 (12/08 1432)  Pediatrician   RPR Non Reactive (12/08 1432)   Support Person  Charles HBsAg Negative (12/08 1432)   Prenatal Classes  HIV Non Reactive (12/08 1432)  Covid vaccine  completed x2 Varicella  immune  BTL Consent  desires [x ] consents 06/23/2020 GBS  (For PCN allergy, check sensitivities)        VBAC Consent  n/a -repeat cesarean Pap  NIL 2021    Hgb Electro      CF  negative     SMA  at risk - genetic counseling referral placed        High Risk Pregnancy Diagnoses  Hx of ASD/PFO- small on bubble study Hx of cesarean delivery x4 Maternal obesity - pregravid BMI >40 cHTN - nifedipine 30 mg daily Hx of cardiomegaly  IUGR - noted at 19 weeks- following with MFM - see recs Tobacco use in pregnancy- quit 06/02/2020      Previous Version   Chronic  hypertension 05/19/2018 by Jimmey Ralph, MD No   Overview Addendum 04/27/2020 10:46 AM by Zipporah Plants, CNM    [x]  Aspirin 81 mg daily after 12 weeks; discontinue after 36 weeks [x]  baseline labs with CBC, CMP, urine protein/creatinine ratio [ ]  no BP meds unless BPs become elevated - controlled with amlodipine 5 mg [ ]  ultrasound for growth at 28, 32, 36 weeks [x]  Baseline EKG - cards referral 12/8  Current antihypertensives:  amlodipine 5 mg Baseline and surveillance labs (pulled in from [facility], refresh links as needed)  Lab Results  Component Value Date   PLT 365 09/27/2019   CREATININE 0.70 05/19/2019   AST 21 05/19/2019   ALT 26 05/19/2019   PROTCRRATIO 0.14 10/09/2018    Antenatal Testing CHTN - O10.919  Group I  BP < 140/90, no preeclampsia, AGA,  nml AFV, +/- meds    Group II BP > 140/90, on meds, no preeclampsia, AGA, nml AFV  20-28-34-38  20-24-28-32-35-38  32//2 x wk  28//BPP wkly then 32//2 x wk  40 no meds; 39 meds  PRN or 37  Pre-eclampsia  GHTN - O13.9/Preeclampsia without severe features  - O14.00   Preeclampsia with severe features - O14.10  Q 3-4wks  Q 2 wks  28//BPP wkly then 32//2 x wk  Inpatient  37  PRN or 34         Previous Version   Obesity in pregnancy, antepartum 02/15/2015 by 12/10/2018, CNM No   Overview Signed 03/16/2020  2:30 PM by 01-08-1971, CNM    BMI >=40 [x]  early 1h gtt - ordered  [ ]  screen sleep apnea [x]  u/s for dating [ ]   [x]  nutritional goals [x ] folic acid 1mg  [ ]  bASA (>12 weeks) [ ]  consider nutrition consult [ ]  consider maternal EKG 1st trimester - cards referral 12/8 [ ]  Growth u/s 28 [ ] , 32 [ ] , 36 weeks [ ]  [ ]  NST/AFI weekly 34+ weeks (34[] ,35[] ,36[] , 37[] , 38[] , 39[] , 40[] ) [ ]  IOL by 41 weeks (scheduled, prn [] )          Preterm labor symptoms and general obstetric precautions including but not limited to vaginal bleeding, contractions, leaking of fluid and fetal movement were  reviewed in detail with the patient. Please refer to After Visit Summary for other counseling recommendations.   - followed by MFM for growth restriction, - s/p anesthesia consult. Needs another consult at 34 weeks  Return in about 3 weeks (around 08/03/2020) for 28 week labs and routine prenatal.   Farrel Conners, MD, 14/11/2019 OB/GYN, [facility] 07/13/2020 4:52 PM

## 2020-07-14 ENCOUNTER — Other Ambulatory Visit: Payer: Self-pay | Admitting: Obstetrics and Gynecology

## 2020-07-14 DIAGNOSIS — O365921 Maternal care for other known or suspected poor fetal growth, second trimester, fetus 1: Secondary | ICD-10-CM

## 2020-07-14 DIAGNOSIS — O99212 Obesity complicating pregnancy, second trimester: Secondary | ICD-10-CM

## 2020-07-14 DIAGNOSIS — O10912 Unspecified pre-existing hypertension complicating pregnancy, second trimester: Secondary | ICD-10-CM

## 2020-07-21 ENCOUNTER — Other Ambulatory Visit: Payer: Self-pay

## 2020-07-21 ENCOUNTER — Ambulatory Visit (HOSPITAL_BASED_OUTPATIENT_CLINIC_OR_DEPARTMENT_OTHER): Payer: Medicaid Other | Admitting: Obstetrics

## 2020-07-21 ENCOUNTER — Ambulatory Visit: Payer: Medicaid Other | Attending: Obstetrics

## 2020-07-21 DIAGNOSIS — O365920 Maternal care for other known or suspected poor fetal growth, second trimester, not applicable or unspecified: Secondary | ICD-10-CM | POA: Insufficient documentation

## 2020-07-21 DIAGNOSIS — Z3A26 26 weeks gestation of pregnancy: Secondary | ICD-10-CM | POA: Diagnosis not present

## 2020-07-21 DIAGNOSIS — O10912 Unspecified pre-existing hypertension complicating pregnancy, second trimester: Secondary | ICD-10-CM | POA: Insufficient documentation

## 2020-07-21 DIAGNOSIS — O365921 Maternal care for other known or suspected poor fetal growth, second trimester, fetus 1: Secondary | ICD-10-CM | POA: Diagnosis not present

## 2020-07-21 DIAGNOSIS — O99212 Obesity complicating pregnancy, second trimester: Secondary | ICD-10-CM | POA: Insufficient documentation

## 2020-07-21 DIAGNOSIS — O99332 Smoking (tobacco) complicating pregnancy, second trimester: Secondary | ICD-10-CM | POA: Insufficient documentation

## 2020-07-21 NOTE — Procedures (Signed)
NST started at 1254 FHR 147 NST stopped 1333 Dr. Parke Poisson reviewed the NST and reactive

## 2020-07-21 NOTE — Progress Notes (Signed)
The NST performed today was reactive for her gestational age.

## 2020-07-28 ENCOUNTER — Other Ambulatory Visit: Payer: Self-pay | Admitting: Obstetrics and Gynecology

## 2020-07-28 DIAGNOSIS — O365930 Maternal care for other known or suspected poor fetal growth, third trimester, not applicable or unspecified: Secondary | ICD-10-CM

## 2020-07-28 DIAGNOSIS — O10919 Unspecified pre-existing hypertension complicating pregnancy, unspecified trimester: Secondary | ICD-10-CM

## 2020-07-28 DIAGNOSIS — O99333 Smoking (tobacco) complicating pregnancy, third trimester: Secondary | ICD-10-CM

## 2020-07-28 DIAGNOSIS — O99213 Obesity complicating pregnancy, third trimester: Secondary | ICD-10-CM

## 2020-08-02 ENCOUNTER — Ambulatory Visit: Payer: Medicaid Other | Attending: Maternal & Fetal Medicine

## 2020-08-02 ENCOUNTER — Other Ambulatory Visit: Payer: Self-pay

## 2020-08-02 DIAGNOSIS — O10919 Unspecified pre-existing hypertension complicating pregnancy, unspecified trimester: Secondary | ICD-10-CM

## 2020-08-02 DIAGNOSIS — Z3A28 28 weeks gestation of pregnancy: Secondary | ICD-10-CM | POA: Diagnosis not present

## 2020-08-02 DIAGNOSIS — O10019 Pre-existing essential hypertension complicating pregnancy, unspecified trimester: Secondary | ICD-10-CM | POA: Insufficient documentation

## 2020-08-02 DIAGNOSIS — O99213 Obesity complicating pregnancy, third trimester: Secondary | ICD-10-CM

## 2020-08-02 DIAGNOSIS — O99333 Smoking (tobacco) complicating pregnancy, third trimester: Secondary | ICD-10-CM

## 2020-08-02 DIAGNOSIS — O365930 Maternal care for other known or suspected poor fetal growth, third trimester, not applicable or unspecified: Secondary | ICD-10-CM | POA: Diagnosis present

## 2020-08-03 ENCOUNTER — Other Ambulatory Visit: Payer: Medicaid Other

## 2020-08-03 ENCOUNTER — Encounter: Payer: Self-pay | Admitting: Obstetrics and Gynecology

## 2020-08-03 ENCOUNTER — Ambulatory Visit (INDEPENDENT_AMBULATORY_CARE_PROVIDER_SITE_OTHER): Payer: Medicaid Other | Admitting: Obstetrics and Gynecology

## 2020-08-03 ENCOUNTER — Other Ambulatory Visit: Payer: Self-pay

## 2020-08-03 VITALS — BP 130/72 | Wt 294.0 lb

## 2020-08-03 DIAGNOSIS — O0993 Supervision of high risk pregnancy, unspecified, third trimester: Secondary | ICD-10-CM

## 2020-08-03 DIAGNOSIS — Z131 Encounter for screening for diabetes mellitus: Secondary | ICD-10-CM

## 2020-08-03 DIAGNOSIS — O34219 Maternal care for unspecified type scar from previous cesarean delivery: Secondary | ICD-10-CM

## 2020-08-03 DIAGNOSIS — Z3A28 28 weeks gestation of pregnancy: Secondary | ICD-10-CM

## 2020-08-03 DIAGNOSIS — I10 Essential (primary) hypertension: Secondary | ICD-10-CM

## 2020-08-03 DIAGNOSIS — Z113 Encounter for screening for infections with a predominantly sexual mode of transmission: Secondary | ICD-10-CM

## 2020-08-03 DIAGNOSIS — O0992 Supervision of high risk pregnancy, unspecified, second trimester: Secondary | ICD-10-CM

## 2020-08-03 LAB — POCT URINALYSIS DIPSTICK OB: Glucose, UA: NEGATIVE

## 2020-08-03 MED ORDER — NIFEDIPINE ER OSMOTIC RELEASE 60 MG PO TB24: 60.0000 mg | ORAL_TABLET | Freq: Every day | ORAL | 3 refills | Status: DC

## 2020-08-03 NOTE — Progress Notes (Signed)
Routine Prenatal Care Visit  Subjective  Ashley Howell is a 30 y.o. [id number] at [redacted]w[redacted]d being seen today for ongoing prenatal care.  She is currently monitored for the following issues for this high-risk pregnancy and has History of cardiomegaly; Previous cesarean delivery, antepartum condition or complication; Obesity in pregnancy, antepartum; Chronic hypertension; PFO (patent foramen ovale); BMI 40.0-44.9, adult (HCC); Previous cesarean delivery, antepartum; Dysplasia of cervix, low grade (CIN 1); Bacterial vaginosis; Supervision of high-risk pregnancy, second trimester; ASD (atrial septal defect); Decreased fetal movement affecting management of pregnancy in second trimester; and Pregnancy affected by fetal growth restriction on their problem list.  ----------------------------------------------------------------------------------- Patient reports intermittent mid/lower back pain and exhaustion..   Contractions: Not present. Vag. Bleeding: None.  Movement: Present. Denies leaking of fluid.  ----------------------------------------------------------------------------------- The following portions of the patient's history were reviewed and updated as appropriate: allergies, current medications, past family history, past medical history, past social history, past surgical history and problem list. Problem list updated.   Objective  Blood pressure (!) 150/70, weight 294 lb (133.4 kg), last menstrual period 01/19/2020, not currently breastfeeding. Pregravid weight 280 lb (127 kg) Total Weight Gain 14 lb (6.35 kg) Urinalysis:      Fetal Status: Fetal Heart Rate (bpm): 140   Movement: Present     General:  Alert, oriented and cooperative. Patient is in no acute distress.  Skin: Skin is warm and dry. No rash noted.   Cardiovascular: Normal heart rate noted  Respiratory: Normal respiratory effort, no problems with respiration noted  Abdomen: Soft, gravid, appropriate for gestational age.  Pain/Pressure: Present     Pelvic:  Cervical exam deferred        Extremities: Normal range of motion.     ental Status: Normal mood and affect. Normal behavior. Normal judgment and thought content.     Assessment   30 y.o. [id number] at [redacted]w[redacted]d by  10/25/2020, by Last Menstrual Period presenting for routine prenatal visit  Plan   pregnancy6 Problems (from 01/19/20 to present)    Problem Noted Resolved   Pregnancy affected by fetal growth restriction 07/13/2020 by Conard Novak, MD No   Overview Signed 07/13/2020  4:41 PM by Conard Novak, MD    - followed by MFM      Supervision of high-risk pregnancy, second trimester 03/16/2020 by Zipporah Plants, CNM No   Overview Addendum 06/23/2020  4:53 PM by Natale Milch, MD     Nursing Staff Provider  Office Location  [facility] Dating   LMP = 10w [place]  Language  Spanish Anatomy US   with MFM - IUGR  Flu Vaccine   UTD (through employer) Genetic Screen  NIPS:   Negx3, XY  TDaP vaccine    Hgb A1C or  GTT Early : 100 Third trimester :   Rhogam   n/a   LAB RESULTS   Feeding Plan  Blood Type O/Positive/-- (12/08 1432)   Contraception  BTL Antibody Negative (12/08 1432)  Circumcision  Rubella <0.90 (12/08 1432)  Pediatrician   RPR Non Reactive (12/08 1432)   Support Person  Charles HBsAg Negative (12/08 1432)   Prenatal Classes  HIV Non Reactive (12/08 1432)  Covid vaccine  completed x2 Varicella  immune  BTL Consent  desires [x ] consents 06/23/2020 GBS  (For PCN allergy, check sensitivities)        VBAC Consent  n/a -repeat cesarean Pap  NIL 2021    Hgb Electro      CF  negative     SMA  at risk - genetic counseling referral placed        High Risk Pregnancy Diagnoses  Hx of ASD/PFO- small on bubble study Hx of cesarean delivery x4 Maternal obesity - pregravid BMI >40 cHTN - nifedipine 30 mg daily Hx of cardiomegaly  IUGR - noted at 19 weeks- following with MFM - see recs Tobacco use in pregnancy- quit 06/02/2020       Previous Version   Chronic hypertension 05/19/2018 by Jimmey Ralph, MD No   Overview Addendum 04/27/2020 10:46 AM by Zipporah Plants, CNM    [x]  Aspirin 81 mg daily after 12 weeks; discontinue after 36 weeks [x]  baseline labs with CBC, CMP, urine protein/creatinine ratio [ ]  no BP meds unless BPs become elevated - controlled with amlodipine 5 mg [ ]  ultrasound for growth at 28, 32, 36 weeks [x]  Baseline EKG - cards referral 12/8  Current antihypertensives:  amlodipine 5 mg Baseline and surveillance labs (pulled in from [facility], refresh links as needed)  Lab Results  Component Value Date   PLT 365 09/27/2019   CREATININE 0.70 05/19/2019   AST 21 05/19/2019   ALT 26 05/19/2019   PROTCRRATIO 0.14 10/09/2018    Antenatal Testing CHTN - O10.919  Group I  BP < 140/90, no preeclampsia, AGA,  nml AFV, +/- meds    Group II BP > 140/90, on meds, no preeclampsia, AGA, nml AFV  20-28-34-38  20-24-28-32-35-38  32//2 x wk  28//BPP wkly then 32//2 x wk  40 no meds; 39 meds  PRN or 37  Pre-eclampsia  GHTN - O13.9/Preeclampsia without severe features  - O14.00   Preeclampsia with severe features - O14.10  Q 3-4wks  Q 2 wks  28//BPP wkly then 32//2 x wk  Inpatient  37  PRN or 34         Previous Version   Obesity in pregnancy, antepartum 02/15/2015 by 12/10/2018, CNM No   Overview Addendum 07/13/2020  4:54 PM by 01-08-1971, MD    BMI >=40 [x]  early 1h gtt - ordered  [x]  u/s for dating [ ]   [x]  nutritional goals [x ] folic acid 1mg  [x]  bASA (>12 weeks) [x]  anesthesia consult  [ ]  repeat anesthesia consult @ 34 wks      Previous Version      -Patient has had several elevated blood pressures documented on last several visits with f/u measurements usually wnl - has been taking 30 mg procardia XL - today's initial BP 150/70 with recheck at 130/72 - will have patient RTC in 1wk for BP check -Patient saw MFM for 01-05-1989 yesterday - report not generated - will  await results - patient stated she was scheduled for f/u in 4 weeks -1h GTT and labs today  Preterm labor precautions including but not limited to vaginal bleeding, contractions, leaking of fluid and fetal movement were reviewed in detail with the patient.    Return in about 1 week (around 08/10/2020) for HROB with MD (BP check), needs to schedule CS.  Farrel Conners, CNM, MSN [facility], [facility] 08/03/2020, 11:03 AM

## 2020-08-04 LAB — 28 WEEK RH+PANEL
Basophils Absolute: 0 10*3/uL (ref 0.0–0.2)
Basos: 0 %
EOS (ABSOLUTE): 0.1 10*3/uL (ref 0.0–0.4)
Eos: 1 %
Gestational Diabetes Screen: 79 mg/dL (ref 65–139)
HIV Screen 4th Generation wRfx: NONREACTIVE
Hematocrit: 34.6 % (ref 34.0–46.6)
Hemoglobin: 11.7 g/dL (ref 11.1–15.9)
Immature Grans (Abs): 0 10*3/uL (ref 0.0–0.1)
Immature Granulocytes: 0 %
Lymphocytes Absolute: 2.5 10*3/uL (ref 0.7–3.1)
Lymphs: 23 %
MCH: 28.3 pg (ref 26.6–33.0)
MCHC: 33.8 g/dL (ref 31.5–35.7)
MCV: 84 fL (ref 79–97)
Monocytes Absolute: 0.8 10*3/uL (ref 0.1–0.9)
Monocytes: 7 %
Neutrophils Absolute: 7.4 10*3/uL — ABNORMAL HIGH (ref 1.4–7.0)
Neutrophils: 69 %
Platelets: 337 10*3/uL (ref 150–450)
RBC: 4.14 x10E6/uL (ref 3.77–5.28)
RDW: 12.9 % (ref 11.7–15.4)
RPR Ser Ql: NONREACTIVE
WBC: 10.8 10*3/uL (ref 3.4–10.8)

## 2020-08-08 ENCOUNTER — Encounter: Payer: Self-pay | Admitting: Intensive Care

## 2020-08-08 ENCOUNTER — Other Ambulatory Visit: Payer: Self-pay

## 2020-08-08 ENCOUNTER — Emergency Department
Admission: EM | Admit: 2020-08-08 | Discharge: 2020-08-08 | Disposition: A | Discharge status: Home / Self Care | Payer: Medicaid Other | Attending: Emergency Medicine | Admitting: Emergency Medicine

## 2020-08-08 ENCOUNTER — Telehealth: Payer: Self-pay | Admitting: Family

## 2020-08-08 ENCOUNTER — Emergency Department: Payer: Medicaid Other

## 2020-08-08 ENCOUNTER — Other Ambulatory Visit: Payer: Self-pay | Admitting: Obstetrics and Gynecology

## 2020-08-08 DIAGNOSIS — Z720 Tobacco use: Secondary | ICD-10-CM

## 2020-08-08 DIAGNOSIS — Z3A28 28 weeks gestation of pregnancy: Secondary | ICD-10-CM | POA: Insufficient documentation

## 2020-08-08 DIAGNOSIS — O365930 Maternal care for other known or suspected poor fetal growth, third trimester, not applicable or unspecified: Secondary | ICD-10-CM

## 2020-08-08 DIAGNOSIS — O99213 Obesity complicating pregnancy, third trimester: Secondary | ICD-10-CM

## 2020-08-08 DIAGNOSIS — R0789 Other chest pain: Secondary | ICD-10-CM | POA: Diagnosis not present

## 2020-08-08 DIAGNOSIS — Z7982 Long term (current) use of aspirin: Secondary | ICD-10-CM | POA: Diagnosis not present

## 2020-08-08 DIAGNOSIS — I10 Essential (primary) hypertension: Secondary | ICD-10-CM | POA: Insufficient documentation

## 2020-08-08 DIAGNOSIS — O26893 Other specified pregnancy related conditions, third trimester: Secondary | ICD-10-CM | POA: Diagnosis present

## 2020-08-08 DIAGNOSIS — Z87891 Personal history of nicotine dependence: Secondary | ICD-10-CM | POA: Diagnosis not present

## 2020-08-08 DIAGNOSIS — O10919 Unspecified pre-existing hypertension complicating pregnancy, unspecified trimester: Secondary | ICD-10-CM

## 2020-08-08 DIAGNOSIS — Z79899 Other long term (current) drug therapy: Secondary | ICD-10-CM | POA: Diagnosis not present

## 2020-08-08 DIAGNOSIS — R0602 Shortness of breath: Secondary | ICD-10-CM | POA: Insufficient documentation

## 2020-08-08 LAB — CBC
HCT: 34.5 % — ABNORMAL LOW (ref 36.0–46.0)
Hemoglobin: 11.4 g/dL — ABNORMAL LOW (ref 12.0–15.0)
MCH: 27.4 pg (ref 26.0–34.0)
MCHC: 33 g/dL (ref 30.0–36.0)
MCV: 82.9 fL (ref 80.0–100.0)
Platelets: 339 10*3/uL (ref 150–400)
RBC: 4.16 MIL/uL (ref 3.87–5.11)
RDW: 13 % (ref 11.5–15.5)
WBC: 11.4 10*3/uL — ABNORMAL HIGH (ref 4.0–10.5)
nRBC: 0 % (ref 0.0–0.2)

## 2020-08-08 LAB — BASIC METABOLIC PANEL
Anion gap: 8 (ref 5–15)
BUN: 8 mg/dL (ref 6–20)
CO2: 20 mmol/L — ABNORMAL LOW (ref 22–32)
Calcium: 9.1 mg/dL (ref 8.9–10.3)
Chloride: 107 mmol/L (ref 98–111)
Creatinine, Ser: 0.5 mg/dL (ref 0.44–1.00)
GFR, Estimated: >60 mL/min (ref >60)
Glucose, Bld: 122 mg/dL — ABNORMAL HIGH (ref 70–99)
Potassium: 3.2 mmol/L — ABNORMAL LOW (ref 3.5–5.1)
Sodium: 135 mmol/L (ref 135–145)

## 2020-08-08 LAB — TROPONIN I (HIGH SENSITIVITY)
Troponin I (High Sensitivity): 4 ng/L (ref <18)
Troponin I (High Sensitivity): 2 ng/L (ref <18)

## 2020-08-08 LAB — HCG, QUANTITATIVE, PREGNANCY: hCG, Beta Chain, Quant, S: 1351 m[IU]/mL — ABNORMAL HIGH (ref <5)

## 2020-08-08 MED ORDER — IOHEXOL 350 MG/ML SOLN
75.0000 mL | Freq: Once | INTRAVENOUS | Status: AC | PRN
  Administered 2020-08-08: 75 mL via INTRAVENOUS
  Filled 2020-08-08: qty 75

## 2020-08-08 NOTE — Telephone Encounter (Signed)
Spoke to pt. C/o shortness of breath and "sharp pain" under left breast that began on Friday 08/05/20.  States pain under breast is intermittent, sharp pain, lasts approx 1-2 seconds. Shortness of breath has been constant since Friday. SOB occurs with walking and at rest "when lying down." Pt has not taken BP/HR at home, unable to. States she was at the doctor office last week and "my BP was very high, but I do not remember what it was." Pt short of breath now when on the phone.   Pt was last seen in office 05/06/20 by Gillian Shields, NP. Pt w/ possible PFO and follow up echo.  At visit pt without any c/o shortness of breath or chest pain.   Due to new onset chest pain and shortness of breath, and SOB constant, advised pt go to the emergency room.  Pt verbalized she will have someone take her to the ER at [facility] now.

## 2020-08-08 NOTE — Telephone Encounter (Signed)
Agree with recommendation for ED evaluation.   Alver Sorrow, NP

## 2020-08-08 NOTE — ED Provider Notes (Signed)
[facility] Emergency Department Provider Note  ____________________________________________   Event Date/Time   First MD Initiated Contact with Patient 08/08/20 1825     (approximate)  I have reviewed the triage vital signs and the nursing notes.   HISTORY  Chief Complaint Chest Pain and Shortness of Breath  HPI Ashley Howell is a 30 y.o. female with a history of hypertension and  at [redacted] weeks gestation with a single IUP, presents for several days of worsening central and left sternal chest pain.  She also reporting associated shortness of breath.  Patient localizes pain to the central chest and just under the left breast.  She denies any diaphoresis, syncope, weakness patient also denies any cough or hemoptysis.  Patient has no complaints related to the pregnancy at this time.  She is considered a high risk given her obesity and hypertension.     Past Medical History:  Diagnosis Date  . Anxiety   . Benign essential hypertension, antepartum 05/19/2018   160/99 at anatomy scan 18 weeks   Pt sent to ER on 05/15/2018 resolved with rest   Elevated at u/s visit - labetalol 100 mg bid initiated  . Hypertension   . PFO (patent foramen ovale)    a. 12/2014 Echo: EF 55-60%, mildly dil LA. PFO w/o significant shunting (reviewed by Dr. Mariah Milling 07/2018).  . Shortness of breath dyspnea     Patient Active Problem List   Diagnosis Date Noted  . Pregnancy affected by fetal growth restriction 07/13/2020  . Decreased fetal movement affecting management of pregnancy in second trimester 07/03/2020  . Supervision of high-risk pregnancy, second trimester 03/16/2020  . ASD (atrial septal defect) 03/16/2020  . Bacterial vaginosis 06/25/2019  . Dysplasia of cervix, low grade (CIN 1) 12/11/2018  . Previous cesarean delivery, antepartum 10/09/2018  . BMI 40.0-44.9, adult (HCC) 09/04/2018  . Chronic hypertension 05/19/2018  . PFO (patent foramen ovale) 02/18/2015  . Obesity  in pregnancy, antepartum 02/15/2015  . Previous cesarean delivery, antepartum condition or complication 02/08/2015  . History of cardiomegaly 10/28/2014    Past Surgical History:  Procedure Laterality Date  . CESAREAN SECTION  10/28/2008  . CESAREAN SECTION N/A 02/22/2015   Procedure: CESAREAN SECTION;  Surgeon: Nadara Mustard, MD;  Location: [facility] ORS;  Service: Obstetrics;  Laterality: N/A;  . CESAREAN SECTION  04/13/2010  . CESAREAN SECTION N/A 10/09/2018   Procedure: CESAREAN SECTION;  Surgeon: Natale Milch, MD;  Location: [facility] ORS;  Service: Obstetrics;  Laterality: N/A;    Prior to Admission medications   Medication Sig Start Date End Date Taking? Authorizing Provider  NIFEdipine (PROCARDIA-XL/NIFEDICAL-XL) 30 MG 24 hr tablet Take 30 mg by mouth daily.   Yes [provider]  aspirin 81 MG chewable tablet Chew 81 mg by mouth daily.    [provider]  Prenat w/o A-[id number]-[id number]-DHA (CITRANATAL ASSURE) 35-1 & 300 MG tablet Take 1 tablet by mouth daily. 03/16/20   Zipporah Plants, CNM    Allergies Patient has no known allergies.  Family History  Problem Relation Age of Onset  . Hypertension Mother   . Stroke Paternal Aunt   . Cancer Maternal Grandfather        Colon    Social History Social History   Tobacco Use  . Smoking status: Former Smoker    Packs/day: 0.25    Years: 2.00    Pack years: 0.50    Types: Cigarettes  . Smokeless tobacco: Never Used  Vaping Use  .  Vaping Use: Never used  Substance Use Topics  . Alcohol use: No  . Drug use: No    Review of Systems  Constitutional: No fever/chills Eyes: No visual changes. ENT: No sore throat. Cardiovascular: Denies chest pain. Respiratory: Reports shortness of breath. Gastrointestinal: No abdominal pain.  No nausea, no vomiting.  No diarrhea.  No constipation. Genitourinary: Negative for dysuria. Musculoskeletal: Negative for back pain. Skin: Negative for rash. Neurological:  Negative for headaches, focal weakness or numbness. ____________________________________________   PHYSICAL EXAM:  VITAL SIGNS: ED Triage Vitals  Enc Vitals Group     BP 08/08/20 1753 (!) 147/87     Pulse Rate 08/08/20 1753 (!) 101     Resp 08/08/20 1753 (!) 22     Temp 08/08/20 1753 98.6 F (37 C)     Temp Source 08/08/20 1753 Oral     SpO2 08/08/20 1752 98 %     Weight 08/08/20 1753 290 lb (131.5 kg)     Height 08/08/20 1753 5\' 7"  (1.702 m)     Head Circumference --      Peak Flow --      Pain Score 08/08/20 1753 9     Pain Loc --      Pain Edu? --      Excl. in GC? --    Constitutional: Alert and oriented. Well appearing and in no acute distress. Eyes: Conjunctivae are normal. PERRL. EOMI. Head: Atraumatic. Nose: No congestion/rhinnorhea. Mouth/Throat: Mucous membranes are moist.  Oropharynx non-erythematous. Neck: No stridor.   Cardiovascular: Normal rate, regular rhythm. Grossly normal heart sounds.  Good peripheral circulation. Respiratory: Normal respiratory effort.  No retractions. Lungs CTAB. Gastrointestinal: Gravid, soft and nontender. No distention. No abdominal bruits. No CVA tenderness. Musculoskeletal: No lower extremity tenderness nor edema.  No joint effusions. Neurologic:  Normal speech and language. No gross focal neurologic deficits are appreciated. No gait instability. Skin:  Skin is warm, dry and intact. No rash noted. Psychiatric: Mood and affect are normal. Speech and behavior are normal.  ____________________________________________   LABS (all labs ordered are listed, but only abnormal results are displayed)  Labs Reviewed  BASIC METABOLIC PANEL - Abnormal; Notable for the following components:      Result Value   Potassium 3.2 (*)    CO2 20 (*)    Glucose, Bld 122 (*)    All other components within normal limits  CBC - Abnormal; Notable for the following components:   WBC 11.4 (*)    Hemoglobin 11.4 (*)    HCT 34.5 (*)    All other  components within normal limits  HCG, QUANTITATIVE, PREGNANCY - Abnormal; Notable for the following components:   hCG, Beta Chain, Quant, S 1,351 (*)    All other components within normal limits  TROPONIN I (HIGH SENSITIVITY)  TROPONIN I (HIGH SENSITIVITY)   ____________________________________________  EKG  ____________________________________________  RADIOLOGY I, 10/08/20, personally viewed and evaluated these images (plain radiographs) as part of my medical decision making, as well as reviewing the written report by the radiologist.  ED MD interpretation:  Agree with report  Official radiology report(s): CT Angio Chest PE W and/or Wo Contrast  Result Date: 08/08/2020 CLINICAL DATA:  Progressive shortness of breath.  Pregnancy. EXAM: CT ANGIOGRAPHY CHEST WITH CONTRAST TECHNIQUE: Multidetector CT imaging of the chest was performed using the standard protocol during bolus administration of intravenous contrast. Multiplanar CT image reconstructions and MIPs were obtained to evaluate the vascular anatomy. CONTRAST:  7mL OMNIPAQUE IOHEXOL  350 MG/ML SOLN COMPARISON:  CT dated April 23, 2018 FINDINGS: Cardiovascular: Contrast injection is sufficient to demonstrate satisfactory opacification of the pulmonary arteries to the segmental level. There is no pulmonary embolus or evidence of right heart strain. The size of the main pulmonary artery is normal. Mild cardiomegaly. The course and caliber of the aorta are normal. There is no atherosclerotic calcification. Opacification decreased due to pulmonary arterial phase contrast bolus timing. Mediastinum/Nodes: -- No mediastinal lymphadenopathy. -- No hilar lymphadenopathy. -- No axillary lymphadenopathy. -- No supraclavicular lymphadenopathy. -- Normal thyroid gland where visualized. -  Unremarkable esophagus. Lungs/Pleura: Airways are patent. No pleural effusion, lobar consolidation, pneumothorax or pulmonary infarction. Upper Abdomen:  Contrast bolus timing is not optimized for evaluation of the abdominal organs. The visualized portions of the organs of the upper abdomen are normal. Musculoskeletal: No chest wall abnormality. No bony spinal canal stenosis. Review of the MIP images confirms the above findings. IMPRESSION: No acute abnormality. Electronically Signed   By: Katherine Mantle M.D.   On: 08/08/2020 20:38    ____________________________________________   PROCEDURES  Procedure(s) performed (including Critical Care):  Procedures ____________________________________________   INITIAL IMPRESSION / ASSESSMENT AND PLAN / ED COURSE  As part of my medical decision making, I reviewed the following data within the electronic MEDICAL RECORD NUMBER Labs reviewed WNL and Notes from prior ED visits     Differential includes, but is not limited to, viral syndrome, bronchitis including COPD exacerbation, pneumonia, reactive airway disease including asthma, CHF including exacerbation with or without pulmonary/interstitial edema, pneumothorax, ACS, thoracic trauma, and pulmonary embolism.  Female patient G7P4, presents to the ED for evaluation of several days of progressive anterior shortness of breath and chest pain.  She was evaluated for complaints in the ED, and given her increased risk of PE, was evaluated with a CT angio.  This test is negative and reassuring at this time.  Patient is overall reassured by her work-up at this time.  She is encouraged to follow with primary provider or OB provider for ongoing symptoms.  She is not inclined at this time to take any medications for symptom relief.  She will return to the ED as discussed for worsening symptoms. ____________________________________________   FINAL CLINICAL IMPRESSION(S) / ED DIAGNOSES  Final diagnoses:  Atypical chest pain     ED Discharge Orders    None      *Please note:  Ashley Howell was evaluated in Emergency Department on 08/09/2020 for the  symptoms described in the history of present illness. She was evaluated in the context of the global COVID-19 pandemic, which necessitated consideration that the patient might be at risk for infection with the SARS-CoV-2 virus that causes COVID-19. Institutional protocols and algorithms that pertain to the evaluation of patients at risk for COVID-19 are in a state of rapid change based on information released by regulatory bodies including the CDC and federal and state organizations. These policies and algorithms were followed during the patient's care in the ED.  Some ED evaluations and interventions may be delayed as a result of limited staffing during and the pandemic.*   Note:  This document was prepared using Dragon voice recognition software and may include unintentional dictation errors.    Lissa Hoard, PA-C 08/09/20 0114    Gilles Chiquito, MD 08/09/20 1115

## 2020-08-08 NOTE — ED Triage Notes (Signed)
PAtient c/o sob and chest that started Friday and has progressively gotten worse. Reports being [redacted] weeks pregnant. C/o some nausea.

## 2020-08-08 NOTE — ED Notes (Signed)
See triage note   Presents with some SOB and chest discomfort   States sx's started on Friday  No fever

## 2020-08-08 NOTE — Discharge Instructions (Signed)
Your exam, labs, and CT angio study were negative for any pulmonary emboli (blood clots).  You should take over-the-counter Tylenol as needed for pain.  Follow-up with your OB provider for ongoing symptom management.  Return to the ED if necessary.

## 2020-08-08 NOTE — Telephone Encounter (Signed)
Patient states she is [redacted]wks pregnant experiencing shortness of breath & pain under left breast. Also said she was probably going to visit hospital

## 2020-08-09 ENCOUNTER — Ambulatory Visit: Payer: Medicaid Other | Attending: Maternal & Fetal Medicine

## 2020-08-09 ENCOUNTER — Other Ambulatory Visit: Payer: Self-pay

## 2020-08-09 DIAGNOSIS — O99213 Obesity complicating pregnancy, third trimester: Secondary | ICD-10-CM

## 2020-08-09 DIAGNOSIS — Z720 Tobacco use: Secondary | ICD-10-CM

## 2020-08-09 DIAGNOSIS — O365930 Maternal care for other known or suspected poor fetal growth, third trimester, not applicable or unspecified: Secondary | ICD-10-CM | POA: Diagnosis not present

## 2020-08-09 DIAGNOSIS — Z3A29 29 weeks gestation of pregnancy: Secondary | ICD-10-CM | POA: Diagnosis not present

## 2020-08-09 DIAGNOSIS — O10919 Unspecified pre-existing hypertension complicating pregnancy, unspecified trimester: Secondary | ICD-10-CM

## 2020-08-10 ENCOUNTER — Ambulatory Visit (INDEPENDENT_AMBULATORY_CARE_PROVIDER_SITE_OTHER): Payer: Medicaid Other | Admitting: Obstetrics and Gynecology

## 2020-08-10 ENCOUNTER — Encounter: Payer: Self-pay | Admitting: Obstetrics and Gynecology

## 2020-08-10 VITALS — BP 124/70 | Wt 293.4 lb

## 2020-08-10 DIAGNOSIS — Z23 Encounter for immunization: Secondary | ICD-10-CM

## 2020-08-10 DIAGNOSIS — O0992 Supervision of high risk pregnancy, unspecified, second trimester: Secondary | ICD-10-CM

## 2020-08-10 NOTE — Progress Notes (Signed)
Routine Prenatal Care Visit  Subjective  Ashley Howell is a 30 y.o. [id number] at [redacted]w[redacted]d being seen today for ongoing prenatal care.  She is currently monitored for the following issues for this low-risk pregnancy and has History of cardiomegaly; Previous cesarean delivery, antepartum condition or complication; Obesity in pregnancy, antepartum; Chronic hypertension; PFO (patent foramen ovale); BMI 40.0-44.9, adult (HCC); Previous cesarean delivery, antepartum; Dysplasia of cervix, low grade (CIN 1); Bacterial vaginosis; Supervision of high-risk pregnancy, second trimester; ASD (atrial septal defect); Decreased fetal movement affecting management of pregnancy in second trimester; and Pregnancy affected by fetal growth restriction on their problem list.  ----------------------------------------------------------------------------------- Patient reports no complaints.   Contractions: Not present. Vag. Bleeding: None.  Movement: Present. Denies leaking of fluid.  ----------------------------------------------------------------------------------- The following portions of the patient's history were reviewed and updated as appropriate: allergies, current medications, past family history, past medical history, past social history, past surgical history and problem list. Problem list updated.   Objective  Blood pressure 124/70, weight 293 lb 6.4 oz (133.1 kg), last menstrual period 01/19/2020, not currently breastfeeding. Pregravid weight 280 lb (127 kg) Total Weight Gain 13 lb 6.4 oz (6.078 kg) Urinalysis:      Fetal Status: Fetal Heart Rate (bpm): 140   Movement: Present     General:  Alert, oriented and cooperative. Patient is in no acute distress.  Skin: Skin is warm and dry. No rash noted.   Cardiovascular: Normal heart rate noted  Respiratory: Normal respiratory effort, no problems with respiration noted  Abdomen: Soft, gravid, appropriate for gestational age. Pain/Pressure: Absent      Pelvic:  Cervical exam deferred        Extremities: Normal range of motion.  Edema: None  Mental Status: Normal mood and affect. Normal behavior. Normal judgment and thought content.     Assessment   30 y.o. [id number] at [redacted]w[redacted]d by  10/25/2020, by Last Menstrual Period presenting for routine prenatal visit  Plan   pregnancy6 Problems (from 01/19/20 to present)    Problem Noted Resolved   Pregnancy affected by fetal growth restriction 07/13/2020 by Conard Novak, MD No   Overview Signed 07/13/2020  4:41 PM by Conard Novak, MD    - followed by MFM      Supervision of high-risk pregnancy, second trimester 03/16/2020 by Zipporah Plants, CNM No   Overview Addendum 08/10/2020  1:49 PM by Natale Milch, MD     Nursing Staff Provider  Office Location  [facility] Dating   LMP = 10w [place]  Language  English Anatomy US   with MFM - IUGR  Flu Vaccine   UTD (through employer) Genetic Screen  NIPS:   Negx3, XY  TDaP vaccine   08/10/2020 Hgb A1C or  GTT Early : 100 Third trimester : 79  Rhogam   n/a   LAB RESULTS   Feeding Plan  Blood Type O/Positive/-- (12/08 1432)   Contraception  BTL Antibody Negative (12/08 1432)  Circumcision  Rubella <0.90 (12/08 1432)  Pediatrician   RPR Non Reactive (12/08 1432)   Support Person  Charles HBsAg Negative (12/08 1432)   Prenatal Classes Discussed HIV Non Reactive (12/08 1432)  Covid vaccine  completed x2 Varicella  immune  BTL Consent  desires [x ] consents 06/23/2020 GBS  (For PCN allergy, check sensitivities)        VBAC Consent  n/a -repeat cesarean Pap  NIL 2021    Hgb Electro      CF  negative  SMA  at risk - genetic counseling referral placed        High Risk Pregnancy Diagnoses  Hx of ASD/PFO- small on bubble study Hx of cesarean delivery x4 Maternal obesity - pregravid BMI >40 cHTN - nifedipine 30 mg daily Hx of cardiomegaly  IUGR - noted at 19 weeks- following with MFM - see recs Tobacco use in pregnancy- quit 06/02/2020       Previous Version   Chronic hypertension 05/19/2018 by Jimmey Ralph, MD No   Overview Addendum 04/27/2020 10:46 AM by Zipporah Plants, CNM    [x]  Aspirin 81 mg daily after 12 weeks; discontinue after 36 weeks [x]  baseline labs with CBC, CMP, urine protein/creatinine ratio [ ]  no BP meds unless BPs become elevated - controlled with amlodipine 5 mg [ ]  ultrasound for growth at 28, 32, 36 weeks [x]  Baseline EKG - cards referral 12/8  Current antihypertensives:  amlodipine 5 mg Baseline and surveillance labs (pulled in from [facility], refresh links as needed)  Lab Results  Component Value Date   PLT 365 09/27/2019   CREATININE 0.70 05/19/2019   AST 21 05/19/2019   ALT 26 05/19/2019   PROTCRRATIO 0.14 10/09/2018    Antenatal Testing CHTN - O10.919  Group I  BP < 140/90, no preeclampsia, AGA,  nml AFV, +/- meds    Group II BP > 140/90, on meds, no preeclampsia, AGA, nml AFV  20-28-34-38  20-24-28-32-35-38  32//2 x wk  28//BPP wkly then 32//2 x wk  40 no meds; 39 meds  PRN or 37  Pre-eclampsia  GHTN - O13.9/Preeclampsia without severe features  - O14.00   Preeclampsia with severe features - O14.10  Q 3-4wks  Q 2 wks  28//BPP wkly then 32//2 x wk  Inpatient  37  PRN or 34         Previous Version   Obesity in pregnancy, antepartum 02/15/2015 by 12/10/2018, CNM No   Overview Addendum 07/13/2020  4:54 PM by [birth year]1971, MD    BMI >=40 [x]  early 1h gtt - ordered  [x]  u/s for dating [ ]   [x]  nutritional goals [x ] folic acid 1mg  [x]  bASA (>12 weeks) [x]  anesthesia consult  [ ]  repeat anesthesia consult @ 34 wks      Previous Version       Start NSTs next week  Gestational age appropriate obstetric precautions including but not limited to vaginal bleeding, contractions, leaking of fluid and fetal movement were reviewed in detail with the patient.    Return in about 2 weeks (around 08/24/2020) for ROB with NST.  13/11/2014 MD [facility], [facility] 08/10/2020, 1:50 PM

## 2020-08-10 NOTE — Progress Notes (Signed)
tdap given

## 2020-08-15 ENCOUNTER — Other Ambulatory Visit: Payer: Self-pay | Admitting: Obstetrics and Gynecology

## 2020-08-15 DIAGNOSIS — O365921 Maternal care for other known or suspected poor fetal growth, second trimester, fetus 1: Secondary | ICD-10-CM

## 2020-08-15 DIAGNOSIS — O99212 Obesity complicating pregnancy, second trimester: Secondary | ICD-10-CM

## 2020-08-15 DIAGNOSIS — O10912 Unspecified pre-existing hypertension complicating pregnancy, second trimester: Secondary | ICD-10-CM

## 2020-08-15 DIAGNOSIS — O99332 Smoking (tobacco) complicating pregnancy, second trimester: Secondary | ICD-10-CM

## 2020-08-16 ENCOUNTER — Telehealth: Payer: Self-pay

## 2020-08-16 NOTE — Telephone Encounter (Signed)
Pt calling; has questions.  [phone]  Pt wants to let CRS know, if possible, that she is admitted to [facility]; possible delivery at 34wks d/t her BP.  Pt states if questions call her or her doctor there.

## 2020-08-18 ENCOUNTER — Ambulatory Visit: Payer: Medicaid Other

## 2020-08-22 ENCOUNTER — Other Ambulatory Visit: Payer: Self-pay | Admitting: Obstetrics and Gynecology

## 2020-08-22 DIAGNOSIS — O10913 Unspecified pre-existing hypertension complicating pregnancy, third trimester: Secondary | ICD-10-CM

## 2020-08-22 DIAGNOSIS — O99213 Obesity complicating pregnancy, third trimester: Secondary | ICD-10-CM

## 2020-08-22 DIAGNOSIS — O365931 Maternal care for other known or suspected poor fetal growth, third trimester, fetus 1: Secondary | ICD-10-CM

## 2020-08-22 DIAGNOSIS — O99333 Smoking (tobacco) complicating pregnancy, third trimester: Secondary | ICD-10-CM

## 2020-08-23 ENCOUNTER — Telehealth: Payer: Self-pay

## 2020-08-23 ENCOUNTER — Observation Stay
Admission: EM | Admit: 2020-08-23 | Discharge: 2020-08-24 | Disposition: A | Discharge status: Home / Self Care | Payer: Medicaid Other | Attending: Obstetrics & Gynecology | Admitting: Obstetrics & Gynecology

## 2020-08-23 DIAGNOSIS — O99213 Obesity complicating pregnancy, third trimester: Secondary | ICD-10-CM | POA: Diagnosis not present

## 2020-08-23 DIAGNOSIS — Z3A31 31 weeks gestation of pregnancy: Secondary | ICD-10-CM | POA: Insufficient documentation

## 2020-08-23 DIAGNOSIS — Z7982 Long term (current) use of aspirin: Secondary | ICD-10-CM | POA: Insufficient documentation

## 2020-08-23 DIAGNOSIS — I10 Essential (primary) hypertension: Secondary | ICD-10-CM | POA: Diagnosis present

## 2020-08-23 DIAGNOSIS — O139 Gestational [pregnancy-induced] hypertension without significant proteinuria, unspecified trimester: Secondary | ICD-10-CM | POA: Diagnosis present

## 2020-08-23 DIAGNOSIS — Z79899 Other long term (current) drug therapy: Secondary | ICD-10-CM | POA: Diagnosis not present

## 2020-08-23 DIAGNOSIS — E669 Obesity, unspecified: Secondary | ICD-10-CM | POA: Insufficient documentation

## 2020-08-23 DIAGNOSIS — O133 Gestational [pregnancy-induced] hypertension without significant proteinuria, third trimester: Secondary | ICD-10-CM | POA: Diagnosis present

## 2020-08-23 DIAGNOSIS — O365931 Maternal care for other known or suspected poor fetal growth, third trimester, fetus 1: Secondary | ICD-10-CM | POA: Insufficient documentation

## 2020-08-23 LAB — CBC WITH DIFFERENTIAL/PLATELET
Abs Immature Granulocytes: 0.05 10*3/uL (ref 0.00–0.07)
Basophils Absolute: 0 10*3/uL (ref 0.0–0.1)
Basophils Relative: 0 %
Eosinophils Absolute: 0.1 10*3/uL (ref 0.0–0.5)
Eosinophils Relative: 1 %
HCT: 34.9 % — ABNORMAL LOW (ref 36.0–46.0)
Hemoglobin: 11.1 g/dL — ABNORMAL LOW (ref 12.0–15.0)
Immature Granulocytes: 0 %
Lymphocytes Relative: 21 %
Lymphs Abs: 2.8 10*3/uL (ref 0.7–4.0)
MCH: 26.8 pg (ref 26.0–34.0)
MCHC: 31.8 g/dL (ref 30.0–36.0)
MCV: 84.3 fL (ref 80.0–100.0)
Monocytes Absolute: 1 10*3/uL (ref 0.1–1.0)
Monocytes Relative: 7 %
Neutro Abs: 9.5 10*3/uL — ABNORMAL HIGH (ref 1.7–7.7)
Neutrophils Relative %: 71 %
Platelets: 317 10*3/uL (ref 150–400)
RBC: 4.14 MIL/uL (ref 3.87–5.11)
RDW: 13.2 % (ref 11.5–15.5)
WBC: 13.4 10*3/uL — ABNORMAL HIGH (ref 4.0–10.5)
nRBC: 0 % (ref 0.0–0.2)

## 2020-08-23 LAB — COMPREHENSIVE METABOLIC PANEL
ALT: 15 U/L (ref 0–44)
AST: 15 U/L (ref 15–41)
Albumin: 2.8 g/dL — ABNORMAL LOW (ref 3.5–5.0)
Alkaline Phosphatase: 79 U/L (ref 38–126)
Anion gap: 9 (ref 5–15)
BUN: 8 mg/dL (ref 6–20)
CO2: 20 mmol/L — ABNORMAL LOW (ref 22–32)
Calcium: 8.7 mg/dL — ABNORMAL LOW (ref 8.9–10.3)
Chloride: 103 mmol/L (ref 98–111)
Creatinine, Ser: 0.54 mg/dL (ref 0.44–1.00)
GFR, Estimated: >60 mL/min (ref >60)
Glucose, Bld: 89 mg/dL (ref 70–99)
Potassium: 3.6 mmol/L (ref 3.5–5.1)
Sodium: 132 mmol/L — ABNORMAL LOW (ref 135–145)
Total Bilirubin: 0.2 mg/dL — ABNORMAL LOW (ref 0.3–1.2)
Total Protein: 6.6 g/dL (ref 6.5–8.1)

## 2020-08-23 LAB — URINALYSIS, ROUTINE W REFLEX MICROSCOPIC
Bilirubin Urine: NEGATIVE
Glucose, UA: NEGATIVE mg/dL
Hgb urine dipstick: NEGATIVE
Ketones, ur: NEGATIVE mg/dL
Leukocytes,Ua: NEGATIVE
Nitrite: NEGATIVE
Protein, ur: NEGATIVE mg/dL
Specific Gravity, Urine: 1.02 (ref 1.005–1.030)
pH: 5 (ref 5.0–8.0)

## 2020-08-23 LAB — PROTEIN / CREATININE RATIO, URINE
Creatinine, Urine: 171 mg/dL
Protein Creatinine Ratio: 0.05 mg/mg{Cre} (ref 0.00–0.15)
Total Protein, Urine: 8 mg/dL

## 2020-08-23 NOTE — Telephone Encounter (Signed)
FMLA/DISABILITY form for Ashley Howell filled out, signature obtained and give to [facility] for processing.

## 2020-08-23 NOTE — OB Triage Note (Signed)
PAtient arrived to unit via EMS for evaluation of BP and headache. PAtient is G7P4 31w 0d.  Patient reports being admitted to [facility] [place] from 5/8-5/11 where she was on IV magnesium and suggested to delivery at 34 weeks due to IUGR and history of PFO and Atrial Septal defect, cardiomegaly, and gestational HTN. Patient placed on EFM and TOCO to non tender area of abdomen. Patient reports headache and BP 200s/90s at home. Patient denies LOF or vaginal bleeding. Patient oriented to care environment. Will notify provider of patient's arrival.

## 2020-08-24 ENCOUNTER — Encounter: Payer: Self-pay | Admitting: Obstetrics and Gynecology

## 2020-08-24 ENCOUNTER — Ambulatory Visit (INDEPENDENT_AMBULATORY_CARE_PROVIDER_SITE_OTHER): Payer: Medicaid Other | Admitting: Obstetrics and Gynecology

## 2020-08-24 ENCOUNTER — Other Ambulatory Visit: Payer: Self-pay

## 2020-08-24 VITALS — BP 136/70 | Ht 67.0 in | Wt 292.4 lb

## 2020-08-24 DIAGNOSIS — Z3A31 31 weeks gestation of pregnancy: Secondary | ICD-10-CM | POA: Diagnosis not present

## 2020-08-24 DIAGNOSIS — O0992 Supervision of high risk pregnancy, unspecified, second trimester: Secondary | ICD-10-CM

## 2020-08-24 DIAGNOSIS — O10913 Unspecified pre-existing hypertension complicating pregnancy, third trimester: Secondary | ICD-10-CM

## 2020-08-24 DIAGNOSIS — O133 Gestational [pregnancy-induced] hypertension without significant proteinuria, third trimester: Secondary | ICD-10-CM | POA: Diagnosis not present

## 2020-08-24 DIAGNOSIS — I10 Essential (primary) hypertension: Secondary | ICD-10-CM

## 2020-08-24 DIAGNOSIS — O163 Unspecified maternal hypertension, third trimester: Secondary | ICD-10-CM

## 2020-08-24 DIAGNOSIS — O0993 Supervision of high risk pregnancy, unspecified, third trimester: Secondary | ICD-10-CM

## 2020-08-24 LAB — FETAL NONSTRESS TEST

## 2020-08-24 MED ORDER — NIFEDIPINE ER OSMOTIC RELEASE 30 MG PO TB24: 90.0000 mg | ORAL_TABLET | Freq: Every day | ORAL | 3 refills | Status: DC

## 2020-08-24 NOTE — Final Progress Note (Signed)
Physician Final Progress Note  Patient ID: Ashley Howell MRN: [id number] DOB/AGE: [birth year] 29 y.o.  Admit date: 08/23/2020 Admitting provider: Nadara Mustard, MD Discharge date: 08/24/2020  Admission Diagnoses: 31 weeks w chronic hypertension and now gestational hypertension  Discharge Diagnoses:  Principal Problem:   Gestational hypertension Active Problems:   Chronic hypertension   Consults: None  Significant Findings/ Diagnostic Studies:  Obstetrics Admission History & Physical   No chief complaint on file.   HPI:  30 y.o. [id number] @ [redacted]w[redacted]d (10/25/2020, by Last Menstrual Period). Admitted on 08/23/2020:   Patient Active Problem List   Diagnosis Date Noted  . Gestational hypertension 08/23/2020  . Pregnancy affected by fetal growth restriction 07/13/2020  . Decreased fetal movement affecting management of pregnancy in second trimester 07/03/2020  . Supervision of high-risk pregnancy, second trimester 03/16/2020  . ASD (atrial septal defect) 03/16/2020  . Bacterial vaginosis 06/25/2019  . Dysplasia of cervix, low grade (CIN 1) 12/11/2018  . Previous cesarean delivery, antepartum 10/09/2018  . BMI 40.0-44.9, adult (HCC) 09/04/2018  . Chronic hypertension 05/19/2018  . PFO (patent foramen ovale) 02/18/2015  . Obesity in pregnancy, antepartum 02/15/2015  . Previous cesarean delivery, antepartum condition or complication 02/08/2015  . History of cardiomegaly 10/28/2014     Presents for high blood pressures as noted at home ,also intermittent headaches. She has been seen at hospitals [facility]) for gestational hypertension, on Procardia.  Currently on outpt mgt.   No CP, SOB, epigastric pain.  Noted home BP (w smaller sized cuff) of 200/100s.  Prenatal care at: at [facility]. Pregnancy complicated by gestational HTN, obesity.  ROS: A review of systems was performed and negative, except as stated in the above HPI. PMHx:  Past Medical History:  Diagnosis Date  .  Anxiety   . Benign essential hypertension, antepartum 05/19/2018   160/99 at anatomy scan 18 weeks   Pt sent to ER on 05/15/2018 resolved with rest   Elevated at u/s visit - labetalol 100 mg bid initiated  . Hypertension   . PFO (patent foramen ovale)    a. 12/2014 Echo: EF 55-60%, mildly dil LA. PFO w/o significant shunting (reviewed by Dr. Mariah Milling 07/2018).  . Shortness of breath dyspnea    PSHx:  Past Surgical History:  Procedure Laterality Date  . CESAREAN SECTION  10/28/2008  . CESAREAN SECTION N/A 02/22/2015   Procedure: CESAREAN SECTION;  Surgeon: Nadara Mustard, MD;  Location: [facility] ORS;  Service: Obstetrics;  Laterality: N/A;  . CESAREAN SECTION  04/13/2010  . CESAREAN SECTION N/A 10/09/2018   Procedure: CESAREAN SECTION;  Surgeon: Natale Milch, MD;  Location: [facility] ORS;  Service: Obstetrics;  Laterality: N/A;   Medications:  Medications Prior to Admission  Medication Sig Dispense Refill Last Dose  . aspirin 81 MG chewable tablet Chew 81 mg by mouth daily.     Marland Kitchen NIFEdipine (PROCARDIA-XL/NIFEDICAL-XL) 30 MG 24 hr tablet Take 30 mg by mouth daily.     . Prenat w/o A-[id number] (CITRANATAL ASSURE) 35-1 & 300 MG tablet Take 1 tablet by mouth daily. 60 each 3    Allergies: has No Known Allergies. OBHx:  OB History  Gravida Para Term Preterm AB Living  7 4 4   2 4   SAB IAB Ectopic Multiple Live Births  1 1   0 4    # Outcome Date GA Lbr Len/2nd Weight Sex Delivery Anes PTL Lv  7 Current           6 SAB  09/28/19 [redacted]w[redacted]d    SAB     5 Term 10/09/18 [redacted]w[redacted]d  2760 g F CS-LTranv Spinal  LIV  4 Term 02/22/15 [redacted]w[redacted]d  2950 g F CS-LTranv Spinal  LIV  3 IAB 05/25/11          2 Term 04/13/10    F CS-LTranv   LIV  1 Term 10/28/08    F CS-LTranv   LIV   XNT:ZGYFVCBS/WHQPRFFMBWGY except as detailed in HPI.Marland Kitchen  No family history of birth defects. Soc Hx: Alcohol: none and Recreational drug use: none  Objective:   Vitals:   08/23/20 2307 08/23/20 2322  BP: 130/61 131/62  Pulse: 84  85  Resp:    Temp:     Constitutional: Well nourished, well developed female in no acute distress.  HEENT: normal Skin: Warm and dry.  Cardiovascular:Regular rate and rhythm.   Extremity: trace to 1+ bilateral pedal edema Respiratory: Clear to auscultation bilateral. Normal respiratory effort Abdomen: gravid, ND, FHT present, without guarding, without rebound tenderness on exam Back: no CVAT Neuro: DTRs 2+, Cranial nerves grossly intact Psych: Alert and Oriented x3. No memory deficits. Normal mood and affect.  MS: normal gait, normal bilateral lower extremity ROM/strength/stability.  Assessment & Plan:   30 y.o. [id number] @ [redacted]w[redacted]d, Admitted on 08/23/2020:Gestataional HTN w 130s/80s at this time, likely malfunctioning or too small home cuff.  Resolved headache.  No s/sx severe criteria preeclampsia.  Labs normal.    Procedures: A NST procedure was performed with FHR monitoring and a normal baseline established, appropriate time of 20-40 minutes of evaluation, and accels >2 seen w 15x15 characteristics.  Results show a REACTIVE NST.   Results for orders placed or performed during the hospital encounter of 08/23/20  CBC with Differential/Platelet  Result Value Ref Range   WBC 13.4 (H) 4.0 - 10.5 K/uL   RBC 4.14 3.87 - 5.11 MIL/uL   Hemoglobin 11.1 (L) 12.0 - 15.0 g/dL   HCT 01.7 (L) 79.3 - 90.3 %   MCV 84.3 80.0 - 100.0 fL   MCH 26.8 26.0 - 34.0 pg   MCHC 31.8 30.0 - 36.0 g/dL   RDW 00.9 23.3 - 00.7 %   Platelets 317 150 - 400 K/uL   nRBC 0.0 0.0 - 0.2 %   Neutrophils Relative % 71 %   Neutro Abs 9.5 (H) 1.7 - 7.7 K/uL   Lymphocytes Relative 21 %   Lymphs Abs 2.8 0.7 - 4.0 K/uL   Monocytes Relative 7 %   Monocytes Absolute 1.0 0.1 - 1.0 K/uL   Eosinophils Relative 1 %   Eosinophils Absolute 0.1 0.0 - 0.5 K/uL   Basophils Relative 0 %   Basophils Absolute 0.0 0.0 - 0.1 K/uL   Immature Granulocytes 0 %   Abs Immature Granulocytes 0.05 0.00 - 0.07 K/uL  Comprehensive metabolic  panel  Result Value Ref Range   Sodium 132 (L) 135 - 145 mmol/L   Potassium 3.6 3.5 - 5.1 mmol/L   Chloride 103 98 - 111 mmol/L   CO2 20 (L) 22 - 32 mmol/L   Glucose, Bld 89 70 - 99 mg/dL   BUN 8 6 - 20 mg/dL   Creatinine, Ser 6.22 0.44 - 1.00 mg/dL   Calcium 8.7 (L) 8.9 - 10.3 mg/dL   Total Protein 6.6 6.5 - 8.1 g/dL   Albumin 2.8 (L) 3.5 - 5.0 g/dL   AST 15 15 - 41 U/L   ALT 15 0 - 44 U/L   Alkaline Phosphatase 79  38 - 126 U/L   Total Bilirubin 0.2 (L) 0.3 - 1.2 mg/dL   GFR, Estimated >19 >14 mL/min   Anion gap 9 5 - 15  Urinalysis, Routine w reflex microscopic Urine, Clean Catch  Result Value Ref Range   Color, Urine YELLOW (A) YELLOW   APPearance HAZY (A) CLEAR   Specific Gravity, Urine 1.020 1.005 - 1.030   pH 5.0 5.0 - 8.0   Glucose, UA NEGATIVE NEGATIVE mg/dL   Hgb urine dipstick NEGATIVE NEGATIVE   Bilirubin Urine NEGATIVE NEGATIVE   Ketones, ur NEGATIVE NEGATIVE mg/dL   Protein, ur NEGATIVE NEGATIVE mg/dL   Nitrite NEGATIVE NEGATIVE   Leukocytes,Ua NEGATIVE NEGATIVE  Protein / creatinine ratio, urine  Result Value Ref Range   Creatinine, Urine 171 mg/dL   Total Protein, Urine 8 mg/dL   Protein Creatinine Ratio 0.05 0.00 - 0.15 mg/mg[Cre]     Discharge Condition: good  Disposition: Discharge disposition: 01-Home or Self Care       Diet: Regular diet  Discharge Activity: Activity as tolerated  Discharge Instructions    Call MD for:   Complete by: As directed    Worsening contractions or pain; leakage of fluid; bleeding.   Diet - low sodium heart healthy   Complete by: As directed    Increase activity slowly   Complete by: As directed      Allergies as of 08/24/2020   No Known Allergies     Medication List    TAKE these medications   aspirin 81 MG chewable tablet Chew 81 mg by mouth daily.   CitraNatal Assure 35-1 & 300 MG tablet Take 1 tablet by mouth daily.   NIFEdipine 30 MG 24 hr tablet Commonly known as:  PROCARDIA-XL/NIFEDICAL-XL Take 30 mg by mouth daily.       Follow-up Information    Schuman, Jaquelyn Bitter, MD. Go in 1 day(s).   Specialty: Obstetrics and Gynecology Contact information: [address] [place] [address] [phone]               Total time spent taking care of this patient: 30 minutes  Signed: Letitia Libra 08/24/2020, 12:19 AM

## 2020-08-24 NOTE — Progress Notes (Signed)
Routine Prenatal Care Visit  Subjective  Ashley Howell is a 30 y.o. [id number] at [redacted]w[redacted]d being seen today for ongoing prenatal care.  She is currently monitored for the following issues for this high-risk pregnancy and has History of cardiomegaly; Previous cesarean delivery, antepartum condition or complication; Obesity in pregnancy, antepartum; Chronic hypertension; PFO (patent foramen ovale); BMI 40.0-44.9, adult (HCC); Previous cesarean delivery, antepartum; Dysplasia of cervix, low grade (CIN 1); Bacterial vaginosis; Supervision of high-risk pregnancy, third trimester; ASD (atrial septal defect); Decreased fetal movement affecting management of pregnancy in second trimester; Pregnancy affected by fetal growth restriction; and Gestational hypertension on their problem list.  ----------------------------------------------------------------------------------- Patient reports no complaints.  She denies headaches, she denies RUQ pain, she denies vision changes, she denies swelling.   She was hospitalized last week at [facility] for 3 days related to elevated severe-range BPs and decreased fetal movement. She reports fetal movement has improved. She received a course of magnesium, although labs were negative for preeclampsia. Her procardia was increased to 90 mg. She reports she was offered inpatient admission and delivery at 34 weeks there, but she would prefer to remain outpatient if possible and lives closer to our facility so prefers delivery and care with [facility].  She reports on [place] at [facility] was 2% and baby was in the breech presentation.  Review of chart confirms this, overall gorwth 10% and EFW: 1313 grams.  Contractions: Not present. Vag. Bleeding: None.  Movement: Present. Denies leaking of fluid.  ----------------------------------------------------------------------------------- The following portions of the patient's history were reviewed and updated as appropriate: allergies, current  medications, past family history, past medical history, past social history, past surgical history and problem list. Problem list updated.   Objective  Blood pressure 136/70, height 5\' 7"  (1.702 m), weight 292 lb 6.4 oz (132.6 kg), last menstrual period 01/19/2020, not currently breastfeeding. Pregravid weight 280 lb (127 kg) Total Weight Gain 12 lb 6.4 oz (5.625 kg) Urinalysis:      Fetal Status: Fetal Heart Rate (bpm): 140   Movement: Present  Presentation: Complete Breech  General:  Alert, oriented and cooperative. Patient is in no acute distress.  Skin: Skin is warm and dry. No rash noted.   Cardiovascular: Normal heart rate noted  Respiratory: Normal respiratory effort, no problems with respiration noted  Abdomen: Soft, gravid, appropriate for gestational age. Pain/Pressure: Absent     Pelvic:  Cervical exam deferred        Extremities: Normal range of motion.  Edema: None  Mental Status: Normal mood and affect. Normal behavior. Normal judgment and thought content.   NST: 145 bpm baseline, moderate variability, 10x10 accelerations, no decelerations.   Assessment   30 y.o. 37 at [redacted]w[redacted]d by  10/25/2020, by Last Menstrual Period presenting for routine prenatal visit  Plan   pregnancy6 Problems (from 01/19/20 to present)    Problem Noted Resolved   Pregnancy affected by fetal growth restriction 07/13/2020 by 09/12/2020, MD No   Overview Signed 07/13/2020  4:41 PM by 09/12/2020, MD    - followed by MFM      Supervision of high-risk pregnancy, third trimester 03/16/2020 by 14/11/2019, CNM No   Overview Addendum 08/10/2020  1:49 PM by 10/10/2020, MD     Nursing Staff Provider  Office Location  [facility] Dating   LMP = 10w Natale Milch  Language  English Anatomy US   with MFM - IUGR  Flu Vaccine   UTD (through employer) Genetic  Screen  NIPS:   Negx3, XY  TDaP vaccine   08/10/2020 Hgb A1C or  GTT Early : 100 Third trimester : 79  Rhogam   n/a   LAB RESULTS    Feeding Plan  Blood Type O/Positive/-- (12/08 1432)   Contraception  BTL Antibody Negative (12/08 1432)  Circumcision  Rubella <0.90 (12/08 1432)  Pediatrician   RPR Non Reactive (12/08 1432)   Support Person  Charles HBsAg Negative (12/08 1432)   Prenatal Classes Discussed HIV Non Reactive (12/08 1432)  Covid vaccine  completed x2 Varicella  immune  BTL Consent  desires [x ] consents 06/23/2020 GBS  (For PCN allergy, check sensitivities)        VBAC Consent  n/a -repeat cesarean Pap  NIL 2021    Hgb Electro      CF  negative     SMA  at risk - genetic counseling referral placed        High Risk Pregnancy Diagnoses  Hx of ASD/PFO- small on bubble study Hx of cesarean delivery x4 Maternal obesity - pregravid BMI >40 cHTN - nifedipine 90 mg daily Hx of cardiomegaly  IUGR - noted at 19 weeks- following with MFM - see recs Tobacco use in pregnancy- quit 06/02/2020      Previous Version   Chronic hypertension 05/19/2018 by Jimmey Ralph, MD No   Overview Addendum 04/27/2020 10:46 AM by Zipporah Plants, CNM    [x]  Aspirin 81 mg daily after 12 weeks; discontinue after 36 weeks [x]  baseline labs with CBC, CMP, urine protein/creatinine ratio [ ]  no BP meds unless BPs become elevated - controlled with amlodipine 5 mg [ ]  ultrasound for growth at 28, 32, 36 weeks [x]  Baseline EKG - cards referral 12/8  Current antihypertensives:  amlodipine 5 mg Baseline and surveillance labs (pulled in from [facility], refresh links as needed)  Lab Results  Component Value Date   PLT 365 09/27/2019   CREATININE 0.70 05/19/2019   AST 21 05/19/2019   ALT 26 05/19/2019   PROTCRRATIO 0.14 10/09/2018    Antenatal Testing CHTN - O10.919  Group I  BP < 140/90, no preeclampsia, AGA,  nml AFV, +/- meds    Group II BP > 140/90, on meds, no preeclampsia, AGA, nml AFV  20-28-34-38  20-24-28-32-35-38  32//2 x wk  28//BPP wkly then 32//2 x wk  40 no meds; 39 meds  PRN or 37  Pre-eclampsia  GHTN -  O13.9/Preeclampsia without severe features  - O14.00   Preeclampsia with severe features - O14.10  Q 3-4wks  Q 2 wks  28//BPP wkly then 32//2 x wk  Inpatient  37  PRN or 34         Previous Version   Obesity in pregnancy, antepartum 02/15/2015 by 12/10/2018, CNM No   Overview Addendum 07/13/2020  4:54 PM by [birth year]1971, MD    BMI >=40 [x]  early 1h gtt - ordered  [x]  u/s for dating [ ]   [x]  nutritional goals [x ] folic acid 1mg  [x]  bASA (>12 weeks) [x]  anesthesia consult  [ ]  repeat anesthesia consult @ 34 wks      Previous Version       Discussed warning signs of preeclampsia. Patient is very familiar with these signs.  Discussed how to perform a fetal kick count.  MFM visit tomorrow, plan with them a day tentative time to schedule cesarean section.  Continue with 90mg  procardia for BP control.  Patient should have once weekly visits with  MFM and once weekly visit with our office moving forward.   Gestational age appropriate obstetric precautions including but not limited to vaginal bleeding, contractions, leaking of fluid and fetal movement were reviewed in detail with the patient.    Return in about 1 week (around 08/31/2020) for Weekly ROB/NST with MD for 5 weeks.  Natale Milch MD [facility], [facility] 08/24/2020, 5:28 PM

## 2020-08-24 NOTE — OB Triage Note (Signed)
Tiburcio Pea, MD at bedside evaluating patient chief complaint. I, RN at bedside to assist as needed.  New orders given for discharge to home and recommendation to keep all remaining outpatient OB visits.

## 2020-08-24 NOTE — OB Triage Note (Signed)
Patient left unit ambulatory with all personal belongings including cell phone and clothing to be driven home by FOB in private passenger vehicle. Patient verbalized understanding of discharge instructions and need to keep all remaining outpatient OB visits. Patient instructed on reasons for seeking care for possible BP needs including worsening headaches and visual changes. Patient was thankful for care.

## 2020-08-24 NOTE — Patient Instructions (Signed)

## 2020-08-24 NOTE — Telephone Encounter (Signed)
Called pt to let her know FMLA papers are ready to be faxed? Needs to fill out forms and pay fee. No answer, LVMTRC.

## 2020-08-24 NOTE — Discharge Summary (Signed)
  See FPN 

## 2020-08-25 ENCOUNTER — Ambulatory Visit: Payer: Medicaid Other | Attending: Obstetrics and Gynecology

## 2020-08-25 ENCOUNTER — Other Ambulatory Visit: Payer: Self-pay | Admitting: Obstetrics and Gynecology

## 2020-08-25 DIAGNOSIS — Z3A31 31 weeks gestation of pregnancy: Secondary | ICD-10-CM | POA: Diagnosis not present

## 2020-08-25 DIAGNOSIS — O99213 Obesity complicating pregnancy, third trimester: Secondary | ICD-10-CM

## 2020-08-25 DIAGNOSIS — O365931 Maternal care for other known or suspected poor fetal growth, third trimester, fetus 1: Secondary | ICD-10-CM

## 2020-08-25 DIAGNOSIS — O10913 Unspecified pre-existing hypertension complicating pregnancy, third trimester: Secondary | ICD-10-CM | POA: Diagnosis not present

## 2020-08-25 DIAGNOSIS — O99333 Smoking (tobacco) complicating pregnancy, third trimester: Secondary | ICD-10-CM | POA: Diagnosis not present

## 2020-08-25 DIAGNOSIS — O365930 Maternal care for other known or suspected poor fetal growth, third trimester, not applicable or unspecified: Secondary | ICD-10-CM | POA: Diagnosis not present

## 2020-08-25 LAB — COMPREHENSIVE METABOLIC PANEL
ALT: 15 IU/L (ref 0–32)
AST: 9 IU/L (ref 0–40)
Albumin/Globulin Ratio: 1.1 — ABNORMAL LOW (ref 1.2–2.2)
Albumin: 3.8 g/dL — ABNORMAL LOW (ref 3.9–5.0)
Alkaline Phosphatase: 104 IU/L (ref 44–121)
BUN/Creatinine Ratio: 13 (ref 9–23)
BUN: 7 mg/dL (ref 6–20)
Bilirubin Total: <0.2 mg/dL (ref 0.0–1.2)
CO2: 19 mmol/L — ABNORMAL LOW (ref 20–29)
Calcium: 9.3 mg/dL (ref 8.7–10.2)
Chloride: 105 mmol/L (ref 96–106)
Creatinine, Ser: 0.54 mg/dL — ABNORMAL LOW (ref 0.57–1.00)
Globulin, Total: 3.4 g/dL (ref 1.5–4.5)
Glucose: 83 mg/dL (ref 65–99)
Potassium: 4 mmol/L (ref 3.5–5.2)
Sodium: 139 mmol/L (ref 134–144)
Total Protein: 7.2 g/dL (ref 6.0–8.5)
eGFR: 128 mL/min/{1.73_m2} (ref >59)

## 2020-08-25 LAB — CBC
Hematocrit: 36.9 % (ref 34.0–46.6)
Hemoglobin: 12.2 g/dL (ref 11.1–15.9)
MCH: 27.6 pg (ref 26.6–33.0)
MCHC: 33.1 g/dL (ref 31.5–35.7)
MCV: 84 fL (ref 79–97)
Platelets: 377 10*3/uL (ref 150–450)
RBC: 4.42 x10E6/uL (ref 3.77–5.28)
RDW: 13 % (ref 11.7–15.4)
WBC: 12.5 10*3/uL — ABNORMAL HIGH (ref 3.4–10.8)

## 2020-08-31 ENCOUNTER — Other Ambulatory Visit: Payer: Self-pay

## 2020-08-31 ENCOUNTER — Encounter: Payer: Self-pay | Admitting: Obstetrics and Gynecology

## 2020-08-31 ENCOUNTER — Ambulatory Visit (INDEPENDENT_AMBULATORY_CARE_PROVIDER_SITE_OTHER): Payer: Medicaid Other | Admitting: Obstetrics and Gynecology

## 2020-08-31 VITALS — BP 130/72 | Ht 67.0 in | Wt 292.0 lb

## 2020-08-31 DIAGNOSIS — Z3A32 32 weeks gestation of pregnancy: Secondary | ICD-10-CM

## 2020-08-31 DIAGNOSIS — I10 Essential (primary) hypertension: Secondary | ICD-10-CM | POA: Diagnosis not present

## 2020-08-31 DIAGNOSIS — O0992 Supervision of high risk pregnancy, unspecified, second trimester: Secondary | ICD-10-CM

## 2020-08-31 LAB — FETAL NONSTRESS TEST

## 2020-08-31 NOTE — Addendum Note (Signed)
Addended by: Clement Husbands A on: 08/31/2020 05:15 PM   Modules accepted: Orders

## 2020-08-31 NOTE — Progress Notes (Signed)
Routine Prenatal Care Visit  Subjective  Ashley Howell is a 30 y.o. [id number] at [redacted]w[redacted]d being seen today for ongoing prenatal care.  She is currently monitored for the following issues for this high-risk pregnancy and has History of cardiomegaly; Previous cesarean delivery, antepartum condition or complication; Obesity in pregnancy, antepartum; Chronic hypertension; PFO (patent foramen ovale); BMI 40.0-44.9, adult (HCC); Previous cesarean delivery, antepartum; Dysplasia of cervix, low grade (CIN 1); Bacterial vaginosis; Supervision of high-risk pregnancy, third trimester; ASD (atrial septal defect); Decreased fetal movement affecting management of pregnancy in second trimester; Pregnancy affected by fetal growth restriction; and Gestational hypertension on their problem list.  ----------------------------------------------------------------------------------- Patient reports no complaints.   Contractions: Not present. Vag. Bleeding: None.  Movement: Absent. Denies leaking of fluid.  ----------------------------------------------------------------------------------- The following portions of the patient's history were reviewed and updated as appropriate: allergies, current medications, past family history, past medical history, past social history, past surgical history and problem list. Problem list updated.   Objective  Blood pressure 130/72, height 5\' 7"  (1.702 m), weight 292 lb (132.5 kg), last menstrual period 01/19/2020, not currently breastfeeding. Pregravid weight 280 lb (127 kg) Total Weight Gain 12 lb (5.443 kg) Urinalysis:      Fetal Status: Fetal Heart Rate (bpm): 140   Movement: Absent     General:  Alert, oriented and cooperative. Patient is in no acute distress.  Skin: Skin is warm and dry. No rash noted.   Cardiovascular: Normal heart rate noted  Respiratory: Normal respiratory effort, no problems with respiration noted  Abdomen: Soft, gravid, appropriate for gestational  age. Pain/Pressure: Present     Pelvic:  Cervical exam deferred        Extremities: Normal range of motion.  Edema: None  Mental Status: Normal mood and affect. Normal behavior. Normal judgment and thought content.     Assessment   30 y.o. 37 at [redacted]w[redacted]d by  10/25/2020, by Last Menstrual Period presenting for routine prenatal visit  Plan   pregnancy6 Problems (from 01/19/20 to present)    Problem Noted Resolved   Pregnancy affected by fetal growth restriction 07/13/2020 by 09/12/2020, MD No   Overview Signed 07/13/2020  4:41 PM by 09/12/2020, MD    - followed by MFM      Supervision of high-risk pregnancy, third trimester 03/16/2020 by 14/11/2019, CNM No   Overview Addendum 08/24/2020  5:28 PM by 08/26/2020, MD     Nursing Staff Provider  Office Location  [facility] Dating   LMP = 10w Natale Milch  Language  English Anatomy US   with MFM - IUGR  Flu Vaccine   UTD (through employer) Genetic Screen  NIPS:   Negx3, XY  TDaP vaccine   08/10/2020 Hgb A1C or  GTT Early : 100 Third trimester : 79  Rhogam   n/a   LAB RESULTS   Feeding Plan  Blood Type O/Positive/-- (12/08 1432)   Contraception  BTL Antibody Negative (12/08 1432)  Circumcision  Rubella <0.90 (12/08 1432)  Pediatrician   RPR Non Reactive (12/08 1432)   Support Person  Charles HBsAg Negative (12/08 1432)   Prenatal Classes Discussed HIV Non Reactive (12/08 1432)  Covid vaccine  completed x2 Varicella  immune  BTL Consent  desires [x ] consents 06/23/2020 GBS  (For PCN allergy, check sensitivities)        VBAC Consent  n/a -repeat cesarean Pap  NIL 2021    Hgb Electro  CF  negative     SMA  at risk - genetic counseling referral placed        High Risk Pregnancy Diagnoses  Hx of ASD/PFO- small on bubble study Hx of cesarean delivery x4 Maternal obesity - pregravid BMI >40 cHTN - nifedipine 90 mg daily (increased from 30 to 90 by [facility] 08/17/2020) Hx of cardiomegaly  IUGR - noted at 19 weeks-  following with MFM - see recs Tobacco use in pregnancy- quit 06/02/2020      Previous Version   Chronic hypertension 05/19/2018 by Jimmey Ralph, MD No   Overview Addendum 04/27/2020 10:46 AM by Zipporah Plants, CNM    [x]  Aspirin 81 mg daily after 12 weeks; discontinue after 36 weeks [x]  baseline labs with CBC, CMP, urine protein/creatinine ratio [ ]  no BP meds unless BPs become elevated - controlled with amlodipine 5 mg [ ]  ultrasound for growth at 28, 32, 36 weeks [x]  Baseline EKG - cards referral 12/8  Current antihypertensives:  amlodipine 5 mg Baseline and surveillance labs (pulled in from [facility], refresh links as needed)  Lab Results  Component Value Date   PLT 365 09/27/2019   CREATININE 0.70 05/19/2019   AST 21 05/19/2019   ALT 26 05/19/2019   PROTCRRATIO 0.14 10/09/2018    Antenatal Testing CHTN - O10.919  Group I  BP < 140/90, no preeclampsia, AGA,  nml AFV, +/- meds    Group II BP > 140/90, on meds, no preeclampsia, AGA, nml AFV  20-28-34-38  20-24-28-32-35-38  32//2 x wk  28//BPP wkly then 32//2 x wk  40 no meds; 39 meds  PRN or 37  Pre-eclampsia  GHTN - O13.9/Preeclampsia without severe features  - O14.00   Preeclampsia with severe features - O14.10  Q 3-4wks  Q 2 wks  28//BPP wkly then 32//2 x wk  Inpatient  37  PRN or 34         Previous Version   Obesity in pregnancy, antepartum 02/15/2015 by 12/10/2018, CNM No   Overview Addendum 07/13/2020  4:54 PM by 01-[birth year]-1971, MD    BMI >=40 [x]  early 1h gtt - ordered  [x]  u/s for dating [ ]   [x]  nutritional goals [x ] folic acid 1mg  [x]  bASA (>12 weeks) [x]  anesthesia consult  [ ]  repeat anesthesia consult @ 34 wks      Previous Version       NST: 140 bpm baseline, moderate variability, 15x15 accelerations, no decelerations. Reactive  Gestational age appropriate obstetric precautions including but not limited to vaginal bleeding, contractions, leaking of fluid and fetal  movement were reviewed in detail with the patient.    Return in about 1 week (around 09/07/2020) for ROB/NST in person.  13/11/2014 MD [facility], [facility] 08/31/2020, 3:49 PM

## 2020-09-01 ENCOUNTER — Ambulatory Visit: Payer: Medicaid Other | Attending: Maternal & Fetal Medicine

## 2020-09-01 ENCOUNTER — Other Ambulatory Visit: Payer: Self-pay | Admitting: Obstetrics and Gynecology

## 2020-09-01 ENCOUNTER — Other Ambulatory Visit: Payer: Self-pay

## 2020-09-01 DIAGNOSIS — O365930 Maternal care for other known or suspected poor fetal growth, third trimester, not applicable or unspecified: Secondary | ICD-10-CM | POA: Diagnosis not present

## 2020-09-01 DIAGNOSIS — O365931 Maternal care for other known or suspected poor fetal growth, third trimester, fetus 1: Secondary | ICD-10-CM

## 2020-09-01 DIAGNOSIS — O99333 Smoking (tobacco) complicating pregnancy, third trimester: Secondary | ICD-10-CM | POA: Diagnosis not present

## 2020-09-01 DIAGNOSIS — O99213 Obesity complicating pregnancy, third trimester: Secondary | ICD-10-CM | POA: Insufficient documentation

## 2020-09-01 DIAGNOSIS — O10913 Unspecified pre-existing hypertension complicating pregnancy, third trimester: Secondary | ICD-10-CM

## 2020-09-01 DIAGNOSIS — Z3A32 32 weeks gestation of pregnancy: Secondary | ICD-10-CM | POA: Insufficient documentation

## 2020-09-01 LAB — COMPREHENSIVE METABOLIC PANEL
ALT: 9 IU/L (ref 0–32)
AST: 8 IU/L (ref 0–40)
Albumin/Globulin Ratio: 1.4 (ref 1.2–2.2)
Albumin: 3.8 g/dL — ABNORMAL LOW (ref 3.9–5.0)
Alkaline Phosphatase: 110 IU/L (ref 44–121)
BUN/Creatinine Ratio: 13 (ref 9–23)
BUN: 7 mg/dL (ref 6–20)
Bilirubin Total: <0.2 mg/dL (ref 0.0–1.2)
CO2: 18 mmol/L — ABNORMAL LOW (ref 20–29)
Calcium: 8.9 mg/dL (ref 8.7–10.2)
Chloride: 102 mmol/L (ref 96–106)
Creatinine, Ser: 0.54 mg/dL — ABNORMAL LOW (ref 0.57–1.00)
Globulin, Total: 2.8 g/dL (ref 1.5–4.5)
Glucose: 78 mg/dL (ref 65–99)
Potassium: 4 mmol/L (ref 3.5–5.2)
Sodium: 137 mmol/L (ref 134–144)
Total Protein: 6.6 g/dL (ref 6.0–8.5)
eGFR: 128 mL/min/{1.73_m2} (ref >59)

## 2020-09-01 LAB — CBC WITH DIFFERENTIAL
Basophils Absolute: 0 10*3/uL (ref 0.0–0.2)
Basos: 0 %
EOS (ABSOLUTE): 0.1 10*3/uL (ref 0.0–0.4)
Eos: 1 %
Hematocrit: 35.8 % (ref 34.0–46.6)
Hemoglobin: 12 g/dL (ref 11.1–15.9)
Immature Grans (Abs): 0 10*3/uL (ref 0.0–0.1)
Immature Granulocytes: 0 %
Lymphocytes Absolute: 2.3 10*3/uL (ref 0.7–3.1)
Lymphs: 21 %
MCH: 27.3 pg (ref 26.6–33.0)
MCHC: 33.5 g/dL (ref 31.5–35.7)
MCV: 81 fL (ref 79–97)
Monocytes Absolute: 0.8 10*3/uL (ref 0.1–0.9)
Monocytes: 7 %
Neutrophils Absolute: 7.9 10*3/uL — ABNORMAL HIGH (ref 1.4–7.0)
Neutrophils: 71 %
RBC: 4.4 x10E6/uL (ref 3.77–5.28)
RDW: 12.6 % (ref 11.7–15.4)
WBC: 11.1 10*3/uL — ABNORMAL HIGH (ref 3.4–10.8)

## 2020-09-02 LAB — PROTEIN / CREATININE RATIO, URINE
Creatinine, Urine: 253.7 mg/dL
Protein, Ur: 33.6 mg/dL
Protein/Creat Ratio: 132 mg/g creat (ref 0–200)

## 2020-09-08 ENCOUNTER — Other Ambulatory Visit: Payer: Self-pay | Admitting: Obstetrics and Gynecology

## 2020-09-08 ENCOUNTER — Ambulatory Visit: Payer: Medicaid Other | Attending: Maternal & Fetal Medicine

## 2020-09-08 ENCOUNTER — Observation Stay: Payer: Medicaid Other

## 2020-09-08 ENCOUNTER — Encounter: Payer: Medicaid Other | Admitting: Obstetrics and Gynecology

## 2020-09-08 ENCOUNTER — Other Ambulatory Visit: Payer: Self-pay

## 2020-09-08 ENCOUNTER — Observation Stay
Admission: EM | Admit: 2020-09-08 | Discharge: 2020-09-08 | Disposition: A | Discharge status: Home / Self Care | Payer: Medicaid Other | Attending: Obstetrics and Gynecology | Admitting: Obstetrics and Gynecology

## 2020-09-08 ENCOUNTER — Ambulatory Visit (INDEPENDENT_AMBULATORY_CARE_PROVIDER_SITE_OTHER): Payer: Medicaid Other | Admitting: Obstetrics and Gynecology

## 2020-09-08 ENCOUNTER — Encounter: Payer: Self-pay | Admitting: Obstetrics and Gynecology

## 2020-09-08 VITALS — BP 132/82 | Wt 292.0 lb

## 2020-09-08 DIAGNOSIS — O99213 Obesity complicating pregnancy, third trimester: Secondary | ICD-10-CM

## 2020-09-08 DIAGNOSIS — O99210 Obesity complicating pregnancy, unspecified trimester: Secondary | ICD-10-CM

## 2020-09-08 DIAGNOSIS — O365931 Maternal care for other known or suspected poor fetal growth, third trimester, fetus 1: Principal | ICD-10-CM | POA: Insufficient documentation

## 2020-09-08 DIAGNOSIS — Z364 Encounter for antenatal screening for fetal growth retardation: Secondary | ICD-10-CM

## 2020-09-08 DIAGNOSIS — O365930 Maternal care for other known or suspected poor fetal growth, third trimester, not applicable or unspecified: Secondary | ICD-10-CM | POA: Diagnosis not present

## 2020-09-08 DIAGNOSIS — Z3A33 33 weeks gestation of pregnancy: Secondary | ICD-10-CM | POA: Insufficient documentation

## 2020-09-08 DIAGNOSIS — O99333 Smoking (tobacco) complicating pregnancy, third trimester: Secondary | ICD-10-CM

## 2020-09-08 DIAGNOSIS — R300 Dysuria: Secondary | ICD-10-CM

## 2020-09-08 DIAGNOSIS — I10 Essential (primary) hypertension: Secondary | ICD-10-CM

## 2020-09-08 DIAGNOSIS — O4100X0 Oligohydramnios, unspecified trimester, not applicable or unspecified: Secondary | ICD-10-CM

## 2020-09-08 DIAGNOSIS — O365990 Maternal care for other known or suspected poor fetal growth, unspecified trimester, not applicable or unspecified: Secondary | ICD-10-CM

## 2020-09-08 DIAGNOSIS — O0993 Supervision of high risk pregnancy, unspecified, third trimester: Secondary | ICD-10-CM

## 2020-09-08 DIAGNOSIS — O163 Unspecified maternal hypertension, third trimester: Secondary | ICD-10-CM

## 2020-09-08 DIAGNOSIS — O10013 Pre-existing essential hypertension complicating pregnancy, third trimester: Secondary | ICD-10-CM

## 2020-09-08 LAB — POCT URINALYSIS DIPSTICK
Bilirubin, UA: NEGATIVE
Blood, UA: NEGATIVE
Glucose, UA: NEGATIVE
Ketones, UA: NEGATIVE
Nitrite, UA: NEGATIVE
Protein, UA: NEGATIVE
Spec Grav, UA: 1.02 (ref 1.010–1.025)
Urobilinogen, UA: 0.2 E.U./dL
pH, UA: 6.5 (ref 5.0–8.0)

## 2020-09-08 MED ORDER — NITROFURANTOIN MONOHYD MACRO 100 MG PO CAPS: 100.0000 mg | ORAL_CAPSULE | Freq: Two times a day (BID) | ORAL | 0 refills | Status: AC

## 2020-09-08 NOTE — Discharge Instructions (Signed)

## 2020-09-08 NOTE — OB Triage Note (Signed)
Pt [id number] [redacted]w[redacted]d presents to L&D from office visit due to non-reactive NST and for pt to get BPP. PT denies leaking of fluid, vaginal bleeding, and contractions. Pt reports positive fetal movement. Initial BP 140/71. Pt denies headache, visual disturbances, RUQ pain, swelling. On RN assessment reflexes +2 with absent clonus, and no edema present. MD aware. No new orders given. VSS. Monitors applied and assessing.

## 2020-09-08 NOTE — Progress Notes (Deleted)
Pt was not able to keep her scheduled appt today at [facility] @ [facility].  She called to notify us.

## 2020-09-08 NOTE — OB Triage Note (Signed)
Pt discharged home in stable condition. RN provided discharged instructions to pt, reviewing follow up care and when to return. Pt verbalized understanding and all questions answered at this time.

## 2020-09-08 NOTE — Discharge Summary (Signed)
Physician Final Progress Note  Patient ID: Ashley Howell MRN: [id number] DOB/AGE: Nov 02, 1990 29 y.o.  Admit date: 09/08/2020 Admitting provider: Vena Austria, MD Discharge date: 09/08/2020   Admission Diagnoses: IUGR  Discharge Diagnoses:  Active Problems:   IUGR (intrauterine growth restriction) affecting care of mother  64 y.o. [id number] at [redacted]w[redacted]d by Estimated Date of Delivery: 10/25/20 with pregnancy complicated by IUGR.  Non-reactive NST in clinic and missed ultrasound appointment at [facility] today for BPP.  +FM, no LOF, no VB, no contractions.  NST reactive on labor and delivery with BPP 8/8.  Temp:  [98.3 F (36.8 C)-98.5 F (36.9 C)] 98.3 F (36.8 C) (06/02 1915) Pulse Rate:  [98-110] 98 (06/02 1915) Resp:  [14-16] 14 (06/02 1915) BP: (129-140)/(72-82) 129/75 (06/02 1915) Weight:  [132.5 kg] 132.5 kg (06/02 1852)  pregnancy6 Problems (from 01/19/20 to present)    Problem Noted Resolved   Pregnancy affected by fetal growth restriction 07/13/2020 by Conard Novak, MD No   Overview Signed 07/13/2020  4:41 PM by Conard Novak, MD    - followed by MFM      Supervision of high-risk pregnancy, third trimester 03/16/2020 by Zipporah Plants, CNM No   Overview Addendum 08/24/2020  5:28 PM by Natale Milch, MD     Nursing Staff Provider  Office Location  [facility] Dating   LMP = 10w [place]  Language  English Anatomy US   with MFM - IUGR  Flu Vaccine   UTD (through employer) Genetic Screen  NIPS:   Negx3, XY  TDaP vaccine   08/10/2020 Hgb A1C or  GTT Early : 100 Third trimester : 79  Rhogam   n/a   LAB RESULTS   Feeding Plan  Blood Type O/Positive/-- (12/08 1432)   Contraception  BTL Antibody Negative (12/08 1432)  Circumcision  Rubella <0.90 (12/08 1432)  Pediatrician   RPR Non Reactive (12/08 1432)   Support Person  Charles HBsAg Negative (12/08 1432)   Prenatal Classes Discussed HIV Non Reactive (12/08 1432)  Covid vaccine  completed x2 Varicella  immune  BTL Consent   desires [x ] consents 06/23/2020 GBS  (For PCN allergy, check sensitivities)        VBAC Consent  n/a -repeat cesarean Pap  NIL 2021    Hgb Electro      CF  negative     SMA  at risk - genetic counseling referral placed        High Risk Pregnancy Diagnoses  Hx of ASD/PFO- small on bubble study Hx of cesarean delivery x4 Maternal obesity - pregravid BMI >40 cHTN - nifedipine 90 mg daily (increased from 30 to 90 by [facility] 08/17/2020) Hx of cardiomegaly  IUGR - noted at 19 weeks- following with MFM - see recs Tobacco use in pregnancy- quit 06/02/2020      Previous Version   Chronic hypertension 05/19/2018 by Jimmey Ralph, MD No   Overview Addendum 04/27/2020 10:46 AM by Zipporah Plants, CNM    [x]  Aspirin 81 mg daily after 12 weeks; discontinue after 36 weeks [x]  baseline labs with CBC, CMP, urine protein/creatinine ratio [ ]  no BP meds unless BPs become elevated - controlled with amlodipine 5 mg [ ]  ultrasound for growth at 28, 32, 36 weeks [x]  Baseline EKG - cards referral 12/8  Current antihypertensives:  amlodipine 5 mg Baseline and surveillance labs (pulled in from [facility], refresh links as needed)  Lab Results  Component Value Date   PLT 365 09/27/2019   CREATININE  0.70 05/19/2019   AST 21 05/19/2019   ALT 26 05/19/2019   PROTCRRATIO 0.14 10/09/2018    Antenatal Testing CHTN - O10.919  Group I  BP < 140/90, no preeclampsia, AGA,  nml AFV, +/- meds    Group II BP > 140/90, on meds, no preeclampsia, AGA, nml AFV  20-28-34-38  20-24-28-32-35-38  32//2 x wk  28//BPP wkly then 32//2 x wk  40 no meds; 39 meds  PRN or 37  Pre-eclampsia  GHTN - O13.9/Preeclampsia without severe features  - O14.00   Preeclampsia with severe features - O14.10  Q 3-4wks  Q 2 wks  28//BPP wkly then 32//2 x wk  Inpatient  37  PRN or 34         Previous Version   Obesity in pregnancy, antepartum 02/15/2015 by Farrel Conners, CNM No   Overview Addendum 07/13/2020  4:54 PM  by Conard Novak, MD    BMI >=40 [x]  early 1h gtt - ordered  [x]  u/s for dating [ ]   [x]  nutritional goals [x ] folic acid 1mg  [x]  bASA (>12 weeks) [x]  anesthesia consult  [ ]  repeat anesthesia consult @ 34 wks      Previous Version       Consults: None  Significant Findings/ Diagnostic Studies: none  Procedures:  BPP 8/8  Baseline: 130 Variability: moderate Accelerations: present Decelerations: absent Tocometry: none The patient was monitored for 30 minutes, fetal heart rate tracing was deemed reactive, category I tracing,  CPT   Discharge Condition: good  Disposition: Discharge disposition: 01-Home or Self Care       Diet: Regular diet  Discharge Activity: Activity as tolerated  Discharge Instructions    Discharge activity:  No Restrictions   Complete by: As directed    Discharge diet:  No restrictions   Complete by: As directed    No sexual activity restrictions   Complete by: As directed    Notify physician for a general feeling that "something is not right"   Complete by: As directed    Notify physician for increase or change in vaginal discharge   Complete by: As directed    Notify physician for intestinal cramps, with or without diarrhea, sometimes described as "gas pain"   Complete by: As directed    Notify physician for leaking of fluid   Complete by: As directed    Notify physician for low, dull backache, unrelieved by heat or Tylenol   Complete by: As directed    Notify physician for menstrual like cramps   Complete by: As directed    Notify physician for pelvic pressure   Complete by: As directed    Notify physician for uterine contractions.  These may be painless and feel like the uterus is tightening or the baby is  "balling up"   Complete by: As directed    Notify physician for vaginal bleeding   Complete by: As directed    PRETERM LABOR:  Includes any of the follwing symptoms that occur between 20 - [redacted] weeks gestation.   If these symptoms are not stopped, preterm labor can result in preterm delivery, placing your baby at risk   Complete by: As directed      Allergies as of 09/08/2020   No Known Allergies     Medication List    TAKE these medications   aspirin 81 MG chewable tablet Chew 81 mg by mouth daily.   CitraNatal Assure 35-1 & 300 MG tablet Take 1 tablet by  mouth daily.   NIFEdipine 30 MG 24 hr tablet Commonly known as: PROCARDIA-XL/NIFEDICAL-XL Take 3 tablets (90 mg total) by mouth daily.   nitrofurantoin (macrocrystal-monohydrate) 100 MG capsule Commonly known as: Macrobid Take 1 capsule (100 mg total) by mouth 2 (two) times daily for 7 days.        Total time spent taking care of this patient: 30 minutes  Signed: Vena Austria 09/08/2020, 8:19 PM

## 2020-09-08 NOTE — Progress Notes (Signed)
Routine Prenatal Care Visit  Subjective  Ashley Howell is a 30 y.o. [id number] at [redacted]w[redacted]d being seen today for ongoing prenatal care.  She is currently monitored for the following issues for this high-risk pregnancy and has History of cardiomegaly; Previous cesarean delivery, antepartum condition or complication; Obesity in pregnancy, antepartum; Chronic hypertension; PFO (patent foramen ovale); BMI 40.0-44.9, adult (HCC); Previous cesarean delivery, antepartum; Dysplasia of cervix, low grade (CIN 1); Bacterial vaginosis; Supervision of high-risk pregnancy, third trimester; ASD (atrial septal defect); Decreased fetal movement affecting management of pregnancy in second trimester; Pregnancy affected by fetal growth restriction; and Gestational hypertension on their problem list.  ----------------------------------------------------------------------------------- Patient reports burning with urination over the last four days. She denies additional concerns. She does report missing her MFM appointment today with plans to reschedule her BPP.  Contractions: Not present. Vag. Bleeding: None.  Movement: Present. Denies leaking of fluid.  ----------------------------------------------------------------------------------- The following portions of the patient's history were reviewed and updated as appropriate: allergies, current medications, past family history, past medical history, past social history, past surgical history and problem list. Problem list updated.  Objective  Blood pressure 132/82, weight 292 lb (132.5 kg), last menstrual period 01/19/2020, not currently breastfeeding. Pregravid weight 280 lb (127 kg) Total Weight Gain 12 lb (5.443 kg) Urinalysis:      Fetal Status: Fetal Heart Rate (bpm): 135 (NRNST)   Movement: Present     General:  Alert, oriented and cooperative. Patient is in no acute distress.  Skin: Skin is warm and dry. No rash noted.   Cardiovascular: Normal heart rate  noted  Respiratory: Normal respiratory effort, no problems with respiration noted  Abdomen: Soft, gravid, appropriate for gestational age. Pain/Pressure: Absent     Pelvic:  Cervical exam deferred        Extremities: Normal range of motion.  Edema: None  ental Status: Normal mood and affect. Normal behavior. Normal judgment and thought content.   NONSTRESS TEST INTERPRETATION  INDICATIONS: cHTN, IUGR FHR baseline: 135 bmp RESULTS:  A NST procedure was performed with FHR monitoring and a normal baseline established, appropriate time of 20-40 minutes of evaluation, and accels >2 seen w 10x10 characteristics.  Results show a NON-REACTIVE NST.    Assessment   30 y.o. Ashley Howell at [redacted]w[redacted]d by  10/25/2020, by Last Menstrual Period presenting for routine prenatal visit  Plan   pregnancy6 Problems (from 01/19/20 to present)    Problem Noted Resolved   Pregnancy affected by fetal growth restriction 07/13/2020 by Conard Novak, MD No   Overview Signed 07/13/2020  4:41 PM by Conard Novak, MD    - followed by MFM      Supervision of high-risk pregnancy, third trimester 03/16/2020 by Zipporah Plants, CNM No   Overview Addendum 08/24/2020  5:28 PM by Natale Milch, MD     Nursing Staff Provider  Office Location  [facility] Dating   LMP = 10w [place]  Language  English Anatomy US   with MFM - IUGR  Flu Vaccine   UTD (through employer) Genetic Screen  NIPS:   Negx3, XY  TDaP vaccine   08/10/2020 Hgb A1C or  GTT Early : 100 Third trimester : 79  Rhogam   n/a   LAB RESULTS   Feeding Plan  Blood Type O/Positive/-- (12/08 1432)   Contraception  BTL Antibody Negative (12/08 1432)  Circumcision  Rubella <0.90 (12/08 1432)  Pediatrician   RPR Non Reactive (12/08 1432)   Support Person  Leonette Most HBsAg  Negative (12/08 1432)   Prenatal Classes Discussed HIV Non Reactive (12/08 1432)  Covid vaccine  completed x2 Varicella  immune  BTL Consent  desires [x ] consents 06/23/2020 GBS  (For PCN allergy,  check sensitivities)        VBAC Consent  n/a -repeat cesarean Pap  NIL 2021    Hgb Electro      CF  negative     SMA  at risk - genetic counseling referral placed        High Risk Pregnancy Diagnoses  Hx of ASD/PFO- small on bubble study Hx of cesarean delivery x4 Maternal obesity - pregravid BMI >40 cHTN - nifedipine 90 mg daily (increased from 30 to 90 by [facility] 08/17/2020) Hx of cardiomegaly  IUGR - noted at 19 weeks- following with MFM - see recs Tobacco use in pregnancy- quit 06/02/2020      Previous Version   Chronic hypertension 05/19/2018 by Jimmey Ralph, MD No   Overview Addendum 04/27/2020 10:46 AM by Zipporah Plants, CNM    [x]  Aspirin 81 mg daily after 12 weeks; discontinue after 36 weeks [x]  baseline labs with CBC, CMP, urine protein/creatinine ratio [ ]  no BP meds unless BPs become elevated - controlled with amlodipine 5 mg [ ]  ultrasound for growth at 28, 32, 36 weeks [x]  Baseline EKG - cards referral 12/8  Current antihypertensives:  amlodipine 5 mg Baseline and surveillance labs (pulled in from [facility], refresh links as needed)  Lab Results  Component Value Date   PLT 365 09/27/2019   CREATININE 0.70 05/19/2019   AST 21 05/19/2019   ALT 26 05/19/2019   PROTCRRATIO 0.14 10/09/2018    Antenatal Testing CHTN - O10.919  Group I  BP < 140/90, no preeclampsia, AGA,  nml AFV, +/- meds    Group II BP > 140/90, on meds, no preeclampsia, AGA, nml AFV  20-28-34-38  20-24-28-32-35-38  32//2 x wk  28//BPP wkly then 32//2 x wk  40 no meds; 39 meds  PRN or 37  Pre-eclampsia  GHTN - O13.9/Preeclampsia without severe features  - O14.00   Preeclampsia with severe features - O14.10  Q 3-4wks  Q 2 wks  28//BPP wkly then 32//2 x wk  Inpatient  37  PRN or 34         Previous Version   Obesity in pregnancy, antepartum 02/15/2015 by 12/10/2018, CNM No   Overview Addendum 07/13/2020  4:54 PM by 01-08-1971, MD    BMI >=40 [x]  early 1h gtt -  ordered  [x]  u/s for dating [ ]   [x]  nutritional goals [x ] folic acid 1mg  [x]  bASA (>12 weeks) [x]  anesthesia consult  [ ]  repeat anesthesia consult @ 34 wks      Previous Version      -NRNST in office - 10x10 accels noted - patient instructed to present to [facility] triage for additional evaluation - reviewed plan with Dr. 13/11/2014 -Burning with urination - urine culture collected - antibx Rx'd. Reviewed with patient, she stated understanding.  Preterm obstetric precautions including but not limited to vaginal bleeding, contractions, leaking of fluid and fetal movement were reviewed in detail with the patient.    Keep previously scheduled follow-up.  Farrel Conners, CNM, MSN [facility], [facility] 09/08/2020, 4:43 PM

## 2020-09-09 LAB — CBC
Hematocrit: 36.1 % (ref 34.0–46.6)
Hemoglobin: 12.2 g/dL (ref 11.1–15.9)
MCH: 27.4 pg (ref 26.6–33.0)
MCHC: 33.8 g/dL (ref 31.5–35.7)
MCV: 81 fL (ref 79–97)
Platelets: 355 10*3/uL (ref 150–450)
RBC: 4.46 x10E6/uL (ref 3.77–5.28)
RDW: 12.9 % (ref 11.7–15.4)
WBC: 10.7 10*3/uL (ref 3.4–10.8)

## 2020-09-09 LAB — COMPREHENSIVE METABOLIC PANEL
ALT: 12 IU/L (ref 0–32)
AST: 12 IU/L (ref 0–40)
Albumin/Globulin Ratio: 1.1 — ABNORMAL LOW (ref 1.2–2.2)
Albumin: 3.6 g/dL — ABNORMAL LOW (ref 3.9–5.0)
Alkaline Phosphatase: 110 IU/L (ref 44–121)
BUN/Creatinine Ratio: 14 (ref 9–23)
BUN: 7 mg/dL (ref 6–20)
Bilirubin Total: 0.2 mg/dL (ref 0.0–1.2)
CO2: 16 mmol/L — ABNORMAL LOW (ref 20–29)
Calcium: 9.3 mg/dL (ref 8.7–10.2)
Chloride: 103 mmol/L (ref 96–106)
Creatinine, Ser: 0.5 mg/dL — ABNORMAL LOW (ref 0.57–1.00)
Globulin, Total: 3.4 g/dL (ref 1.5–4.5)
Glucose: 79 mg/dL (ref 65–99)
Potassium: 3.8 mmol/L (ref 3.5–5.2)
Sodium: 135 mmol/L (ref 134–144)
Total Protein: 7 g/dL (ref 6.0–8.5)
eGFR: 130 mL/min/{1.73_m2} (ref >59)

## 2020-09-12 ENCOUNTER — Other Ambulatory Visit: Payer: Self-pay | Admitting: Obstetrics and Gynecology

## 2020-09-12 DIAGNOSIS — O4100X0 Oligohydramnios, unspecified trimester, not applicable or unspecified: Secondary | ICD-10-CM

## 2020-09-12 DIAGNOSIS — O99213 Obesity complicating pregnancy, third trimester: Secondary | ICD-10-CM

## 2020-09-12 DIAGNOSIS — Z364 Encounter for antenatal screening for fetal growth retardation: Secondary | ICD-10-CM

## 2020-09-12 DIAGNOSIS — O99333 Smoking (tobacco) complicating pregnancy, third trimester: Secondary | ICD-10-CM

## 2020-09-12 DIAGNOSIS — O10919 Unspecified pre-existing hypertension complicating pregnancy, unspecified trimester: Secondary | ICD-10-CM

## 2020-09-14 ENCOUNTER — Encounter: Payer: Medicaid Other | Admitting: Obstetrics and Gynecology

## 2020-09-14 LAB — URINE CULTURE

## 2020-09-15 ENCOUNTER — Encounter: Payer: Self-pay | Admitting: Obstetrics & Gynecology

## 2020-09-15 ENCOUNTER — Ambulatory Visit: Payer: Medicaid Other | Attending: Maternal & Fetal Medicine

## 2020-09-15 ENCOUNTER — Ambulatory Visit (INDEPENDENT_AMBULATORY_CARE_PROVIDER_SITE_OTHER): Payer: Medicaid Other | Admitting: Obstetrics & Gynecology

## 2020-09-15 ENCOUNTER — Other Ambulatory Visit: Payer: Self-pay

## 2020-09-15 VITALS — BP 134/85 | HR 107 | Temp 98.3°F | Resp 18 | Ht 67.0 in | Wt 296.0 lb

## 2020-09-15 VITALS — BP 130/80 | Wt 297.0 lb

## 2020-09-15 DIAGNOSIS — O99333 Smoking (tobacco) complicating pregnancy, third trimester: Secondary | ICD-10-CM | POA: Diagnosis not present

## 2020-09-15 DIAGNOSIS — O0993 Supervision of high risk pregnancy, unspecified, third trimester: Secondary | ICD-10-CM

## 2020-09-15 DIAGNOSIS — O365930 Maternal care for other known or suspected poor fetal growth, third trimester, not applicable or unspecified: Secondary | ICD-10-CM | POA: Diagnosis not present

## 2020-09-15 DIAGNOSIS — Z3A34 34 weeks gestation of pregnancy: Secondary | ICD-10-CM | POA: Diagnosis not present

## 2020-09-15 DIAGNOSIS — O10013 Pre-existing essential hypertension complicating pregnancy, third trimester: Secondary | ICD-10-CM | POA: Insufficient documentation

## 2020-09-15 DIAGNOSIS — F17210 Nicotine dependence, cigarettes, uncomplicated: Secondary | ICD-10-CM | POA: Insufficient documentation

## 2020-09-15 DIAGNOSIS — I10 Essential (primary) hypertension: Secondary | ICD-10-CM

## 2020-09-15 DIAGNOSIS — O4100X0 Oligohydramnios, unspecified trimester, not applicable or unspecified: Secondary | ICD-10-CM | POA: Insufficient documentation

## 2020-09-15 DIAGNOSIS — O99210 Obesity complicating pregnancy, unspecified trimester: Secondary | ICD-10-CM

## 2020-09-15 DIAGNOSIS — Z364 Encounter for antenatal screening for fetal growth retardation: Secondary | ICD-10-CM | POA: Insufficient documentation

## 2020-09-15 DIAGNOSIS — O365990 Maternal care for other known or suspected poor fetal growth, unspecified trimester, not applicable or unspecified: Secondary | ICD-10-CM

## 2020-09-15 DIAGNOSIS — E669 Obesity, unspecified: Secondary | ICD-10-CM | POA: Diagnosis not present

## 2020-09-15 DIAGNOSIS — O99213 Obesity complicating pregnancy, third trimester: Secondary | ICD-10-CM | POA: Diagnosis not present

## 2020-09-15 NOTE — Patient Instructions (Signed)

## 2020-09-15 NOTE — Progress Notes (Signed)
Subjective  Fetal Movement? yes Contractions? no Leaking Fluid? no Vaginal Bleeding? No Denies sx's of preeclampsia  Objective  BP 130/80   Wt 297 lb (134.7 kg)   LMP 01/19/2020   BMI 46.52 kg/m  General: NAD Pumonary: no increased work of breathing Abdomen: gravid, non-tender Extremities: no edema Psychiatric: mood appropriate, affect full  A NST procedure was performed with FHR monitoring and a normal baseline established, appropriate time of 20-40 minutes of evaluation, and accels >2 seen w 10x10 characteristics.  Results show a REACTIVE NST.   Review of ULTRASOUND.    I have personally reviewed images and report of recent ultrasound done at [facility].    Plan of management to be discussed with patient. AFI and growth nml  Assessment  30 y.o. [id number] at [redacted]w[redacted]d by  10/25/2020, by Last Menstrual Period presenting for routine prenatal visit  Plan   Problem List Items Addressed This Visit      Cardiovascular and Mediastinum   Chronic hypertension     Other   Obesity in pregnancy, antepartum   Supervision of high-risk pregnancy, third trimester - Primary   Poor fetal growth affecting management of mother in third trimester  Other Visit Diagnoses    [redacted] weeks gestation of pregnancy        PNV, FMC, PTL precautions CS planned, w BTL APT weekly     Nursing Staff Provider  Office Location  [facility] Dating   LMP = 10w [place]  Language  English Anatomy US   with MFM - IUGR  Flu Vaccine   UTD (through employer) Genetic Screen  NIPS:   Negx3, XY  TDaP vaccine   08/10/2020 Hgb A1C or  GTT Early : 100 Third trimester : 79  Rhogam   n/a   LAB RESULTS   Feeding Plan  Blood Type O/Positive/-- (12/08 1432)   Contraception  BTL Antibody Negative (12/08 1432)  Circumcision  Rubella <0.90 (12/08 1432)  Pediatrician   RPR Non Reactive (12/08 1432)   Support Person  Charles HBsAg Negative (12/08 1432)   Prenatal Classes Discussed HIV Non Reactive (12/08 1432)  Covid vaccine   completed x2 Varicella  immune  BTL Consent  desires [x ] consents 06/23/2020 GBS  (For PCN allergy, check sensitivities)        VBAC Consent  n/a -repeat cesarean Pap  NIL 2021    Hgb Electro      CF  negative     SMA  at risk - genetic counseling referral placed        High Risk Pregnancy Diagnoses  Hx of ASD/PFO- small on bubble study Hx of cesarean delivery x4 Maternal obesity - pregravid BMI >40 cHTN - nifedipine 90 mg daily (increased from 30 to 90 by [facility] 08/17/2020) Hx of cardiomegaly  IUGR - noted at 19 weeks- following with MFM - see recs Tobacco use in pregnancy- quit 06/02/2020       Chronic hypertension 05/19/2018 by Jimmey Ralph, MD No   Overview Addendum 04/27/2020 10:46 AM by Zipporah Plants, CNM    [x]  Aspirin 81 mg daily after 12 weeks; discontinue after 36 weeks [x]  baseline labs with CBC, CMP, urine protein/creatinine ratio [ ]  no BP meds unless BPs become elevated - controlled with amlodipine 5 mg [ ]  ultrasound for growth at 28, 32, 36 weeks [x]  Baseline EKG - cards referral 12/8  Current antihypertensives:  amlodipine 5 mg Baseline and surveillance labs (pulled in from [facility], refresh links as needed)  Lab Results  Component Value Date   PLT 365 09/27/2019   CREATININE 0.70 05/19/2019   AST 21 05/19/2019   ALT 26 05/19/2019   PROTCRRATIO 0.14 10/09/2018    Antenatal Testing CHTN - O10.919  Group I  BP < 140/90, no preeclampsia, AGA,  nml AFV, +/- meds    Group II BP > 140/90, on meds, no preeclampsia, AGA, nml AFV  20-28-34-38  20-24-28-32-35-38  32//2 x wk  28//BPP wkly then 32//2 x wk  40 no meds; 39 meds  PRN or 37  Pre-eclampsia  GHTN - O13.9/Preeclampsia without severe features  - O14.00   Preeclampsia with severe features - O14.10  Q 3-4wks  Q 2 wks  28//BPP wkly then 32//2 x wk  Inpatient  37  PRN or 34          Obesity in pregnancy, antepartum 02/15/2015 by Farrel Conners, CNM No   Overview Addendum 07/13/2020   4:54 PM by Conard Novak, MD    BMI >=40 [x]  early 1h gtt - ordered  [x]  u/s for dating [ ]   [x]  nutritional goals [x ] folic acid 1mg  [x]  bASA (>12 weeks) [x]  anesthesia consult  [ ]  repeat anesthesia consult @ 34 wks           , MD, Ob/Gyn, [facility] 09/15/2020  2:39 PM

## 2020-09-16 ENCOUNTER — Other Ambulatory Visit: Payer: Medicaid Other | Attending: Obstetrics and Gynecology

## 2020-09-16 NOTE — Telephone Encounter (Signed)
Pt has been seen here since this msg.  Msg closed.

## 2020-09-22 ENCOUNTER — Ambulatory Visit (INDEPENDENT_AMBULATORY_CARE_PROVIDER_SITE_OTHER): Payer: Medicaid Other | Admitting: Obstetrics and Gynecology

## 2020-09-22 ENCOUNTER — Ambulatory Visit: Payer: Medicaid Other | Attending: Maternal & Fetal Medicine

## 2020-09-22 ENCOUNTER — Other Ambulatory Visit: Payer: Self-pay

## 2020-09-22 ENCOUNTER — Ambulatory Visit: Payer: Medicaid Other

## 2020-09-22 ENCOUNTER — Encounter: Payer: Medicaid Other | Admitting: Obstetrics and Gynecology

## 2020-09-22 VITALS — BP 128/72 | Ht 67.0 in | Wt 297.4 lb

## 2020-09-22 DIAGNOSIS — O365930 Maternal care for other known or suspected poor fetal growth, third trimester, not applicable or unspecified: Secondary | ICD-10-CM | POA: Diagnosis not present

## 2020-09-22 DIAGNOSIS — O10013 Pre-existing essential hypertension complicating pregnancy, third trimester: Secondary | ICD-10-CM | POA: Insufficient documentation

## 2020-09-22 DIAGNOSIS — O4100X0 Oligohydramnios, unspecified trimester, not applicable or unspecified: Secondary | ICD-10-CM | POA: Insufficient documentation

## 2020-09-22 DIAGNOSIS — Z3A35 35 weeks gestation of pregnancy: Secondary | ICD-10-CM | POA: Insufficient documentation

## 2020-09-22 DIAGNOSIS — O10919 Unspecified pre-existing hypertension complicating pregnancy, unspecified trimester: Secondary | ICD-10-CM

## 2020-09-22 DIAGNOSIS — O99333 Smoking (tobacco) complicating pregnancy, third trimester: Secondary | ICD-10-CM | POA: Diagnosis not present

## 2020-09-22 DIAGNOSIS — O99210 Obesity complicating pregnancy, unspecified trimester: Secondary | ICD-10-CM

## 2020-09-22 DIAGNOSIS — I10 Essential (primary) hypertension: Secondary | ICD-10-CM

## 2020-09-22 DIAGNOSIS — Z364 Encounter for antenatal screening for fetal growth retardation: Secondary | ICD-10-CM | POA: Diagnosis present

## 2020-09-22 DIAGNOSIS — O0993 Supervision of high risk pregnancy, unspecified, third trimester: Secondary | ICD-10-CM

## 2020-09-22 DIAGNOSIS — O99213 Obesity complicating pregnancy, third trimester: Secondary | ICD-10-CM | POA: Diagnosis not present

## 2020-09-22 NOTE — Procedures (Unsigned)
Ashley Howell 08-09-1990 [redacted]w[redacted]d  Fetus A Non-Stress Test Interpretation for 09/22/20  Indication: {MFM NST INDICATIONS:20869}  Fetal Heart Rate A Mode: External Baseline Rate (A): 132 bpm Variability: Moderate Accelerations: 10 x 10 Decelerations: None  Uterine Activity Mode: Toco  Interpretation (Fetal Testing) Nonstress Test Interpretation: Reactive (Dr. Grace Bushy Read NST) Comments: NST completed at 1541

## 2020-09-23 ENCOUNTER — Encounter: Payer: Self-pay | Admitting: Obstetrics and Gynecology

## 2020-09-23 ENCOUNTER — Other Ambulatory Visit (HOSPITAL_COMMUNITY)
Admission: RE | Admit: 2020-09-23 | Discharge: 2020-09-23 | Disposition: A | Discharge status: Home / Self Care | Payer: Medicaid Other | Source: Ambulatory Visit | Attending: Obstetrics and Gynecology | Admitting: Obstetrics and Gynecology

## 2020-09-23 DIAGNOSIS — O0993 Supervision of high risk pregnancy, unspecified, third trimester: Secondary | ICD-10-CM | POA: Insufficient documentation

## 2020-09-23 NOTE — Progress Notes (Signed)
Routine Prenatal Care Visit  Subjective  Ashley Howell is a 30 y.o. O9G2952 at [redacted]w[redacted]d being seen today for ongoing prenatal care.  She is currently monitored for the following issues for this high-risk pregnancy and has History of cardiomegaly; Previous cesarean delivery, antepartum condition or complication; Obesity in pregnancy, antepartum; Chronic hypertension; PFO (patent foramen ovale); BMI 40.0-44.9, adult (HCC); Previous cesarean delivery, antepartum; Dysplasia of cervix, low grade (CIN 1); Bacterial vaginosis; Supervision of high-risk pregnancy, third trimester; ASD (atrial septal defect); Decreased fetal movement affecting management of pregnancy in second trimester; Pregnancy affected by fetal growth restriction; Gestational hypertension; and Poor fetal growth affecting management of mother in third trimester on their problem list.  ----------------------------------------------------------------------------------- Patient reports no complaints.   Contractions: Not present. Vag. Bleeding: None.  Movement: Present. Denies leaking of fluid.  ----------------------------------------------------------------------------------- The following portions of the patient's history were reviewed and updated as appropriate: allergies, current medications, past family history, past medical history, past social history, past surgical history and problem list. Problem list updated.   Objective  Blood pressure 128/72, height 5\' 7"  (1.702 m), weight 297 lb 6.4 oz (134.9 kg), last menstrual period 01/19/2020, not currently breastfeeding. Pregravid weight 280 lb (127 kg) Total Weight Gain 17 lb 6.4 oz (7.893 kg) Urinalysis:      Fetal Status: Fetal Heart Rate (bpm): 129   Movement: Present     General:  Alert, oriented and cooperative. Patient is in no acute distress.  Skin: Skin is warm and dry. No rash noted.   Cardiovascular: Normal heart rate noted  Respiratory: Normal respiratory effort, no  problems with respiration noted  Abdomen: Soft, gravid, appropriate for gestational age. Pain/Pressure: Absent     Pelvic:  Cervical exam deferred        Extremities: Normal range of motion.  Edema: None  Mental Status: Normal mood and affect. Normal behavior. Normal judgment and thought content.     Assessment   31 y.o. 37 at [redacted]w[redacted]d by  10/25/2020, by Last Menstrual Period presenting for routine prenatal visit   Plan   pregnancy6 Problems (from 01/19/20 to present)     Problem Noted Resolved   Pregnancy affected by fetal growth restriction 07/13/2020 by 09/12/2020, MD No   Overview Signed 07/13/2020  4:41 PM by 09/12/2020, MD    - followed by MFM       Supervision of high-risk pregnancy, third trimester 03/16/2020 by 14/11/2019, CNM No   Overview Addendum 08/24/2020  5:28 PM by 08/26/2020, MD     Nursing Staff Provider  Office Location  [facility] Dating   LMP = 10w Natale Milch  Language  English Anatomy US   with MFM - IUGR  Flu Vaccine   UTD (through employer) Genetic Screen  NIPS:   Negx3, XY  TDaP vaccine   08/10/2020 Hgb A1C or  GTT Early : 100 Third trimester : 79  Rhogam   n/a   LAB RESULTS   Feeding Plan  Blood Type O/Positive/-- (12/08 1432)   Contraception  BTL Antibody Negative (12/08 1432)  Circumcision  Rubella <0.90 (12/08 1432)  Pediatrician   RPR Non Reactive (12/08 1432)   Support Person  Charles HBsAg Negative (12/08 1432)   Prenatal Classes Discussed HIV Non Reactive (12/08 1432)  Covid vaccine  completed x2 Varicella  immune  BTL Consent  desires [x ] consents 06/23/2020 GBS  (For PCN allergy, check sensitivities)        VBAC Consent  n/a -repeat cesarean Pap  NIL 2021    Hgb Electro      CF  negative     SMA  at risk - genetic counseling referral placed       High Risk Pregnancy Diagnoses  Hx of ASD/PFO- small on bubble study Hx of cesarean delivery x4 Maternal obesity - pregravid BMI >40 cHTN - nifedipine 90 mg daily  (increased from 30 to 90 by [facility] 08/17/2020) Hx of cardiomegaly  IUGR - noted at 19 weeks- following with MFM - see recs Tobacco use in pregnancy- quit 06/02/2020       Chronic hypertension 05/19/2018 by Jimmey Ralph, MD No   Overview Addendum 04/27/2020 10:46 AM by Zipporah Plants, CNM    [x]  Aspirin 81 mg daily after 12 weeks; discontinue after 36 weeks [x]  baseline labs with CBC, CMP, urine protein/creatinine ratio [ ]  no BP meds unless BPs become elevated - controlled with amlodipine 5 mg [ ]  ultrasound for growth at 28, 32, 36 weeks [x]  Baseline EKG - cards referral 12/8  Current antihypertensives:  amlodipine 5 mg Baseline and surveillance labs (pulled in from [facility], refresh links as needed)  Lab Results  Component Value Date   PLT 365 09/27/2019   CREATININE 0.70 05/19/2019   AST 21 05/19/2019   ALT 26 05/19/2019   PROTCRRATIO 0.14 10/09/2018   Antenatal Testing CHTN - O10.919  Group I  BP < 140/90, no preeclampsia, AGA,  nml AFV, +/- meds    Group II BP > 140/90, on meds, no preeclampsia, AGA, nml AFV  20-28-34-38  20-24-28-32-35-38  32//2 x wk  28//BPP wkly then 32//2 x wk  40 no meds; 39 meds  PRN or 37  Pre-eclampsia  GHTN - O13.9/Preeclampsia without severe features  - O14.00   Preeclampsia with severe features - O14.10  Q 3-4wks  Q 2 wks  28//BPP wkly then 32//2 x wk  Inpatient  37  PRN or 34         Obesity in pregnancy, antepartum 02/15/2015 by 12/10/2018, CNM No   Overview Addendum 07/13/2020  4:54 PM by 01-08-1971, MD    BMI >=40 [x]  early 1h gtt - ordered  [x]  u/s for dating [ ]   [x]  nutritional goals [x ] folic acid 1mg  [x]  bASA (>12 weeks) [x]  anesthesia consult  [ ]  repeat anesthesia consult @ 34 wks            GBS/ GC/CT collected today Consents for cesarean and BTL signed.   Gestational age appropriate obstetric precautions including but not limited to vaginal bleeding, contractions, leaking of fluid and  fetal movement were reviewed in detail with the patient.    Return in about 4 days (around 09/26/2020) for ROB with MD/ NST.  13/11/2014 MD [facility], [facility] 09/23/2020, 2:00 AM

## 2020-09-26 ENCOUNTER — Encounter: Payer: Medicaid Other | Admitting: Obstetrics and Gynecology

## 2020-09-27 ENCOUNTER — Other Ambulatory Visit: Admission: RE | Admit: 2020-09-27 | Payer: Medicaid Other | Source: Ambulatory Visit

## 2020-09-27 LAB — CERVICOVAGINAL ANCILLARY ONLY
Chlamydia: NEGATIVE
Comment: NEGATIVE
Comment: NORMAL
Neisseria Gonorrhea: NEGATIVE

## 2020-09-27 LAB — CULTURE, BETA STREP (GROUP B ONLY): Strep Gp B Culture: NEGATIVE

## 2020-09-28 ENCOUNTER — Ambulatory Visit (INDEPENDENT_AMBULATORY_CARE_PROVIDER_SITE_OTHER): Payer: Medicaid Other | Admitting: Obstetrics and Gynecology

## 2020-09-28 ENCOUNTER — Encounter: Payer: Medicaid Other | Admitting: Obstetrics and Gynecology

## 2020-09-28 ENCOUNTER — Other Ambulatory Visit: Payer: Self-pay | Admitting: Obstetrics and Gynecology

## 2020-09-28 ENCOUNTER — Encounter: Payer: Self-pay | Admitting: Obstetrics and Gynecology

## 2020-09-28 ENCOUNTER — Other Ambulatory Visit: Payer: Self-pay

## 2020-09-28 VITALS — BP 128/72 | Ht 67.0 in | Wt 296.6 lb

## 2020-09-28 DIAGNOSIS — O99213 Obesity complicating pregnancy, third trimester: Secondary | ICD-10-CM

## 2020-09-28 DIAGNOSIS — O4103X0 Oligohydramnios, third trimester, not applicable or unspecified: Secondary | ICD-10-CM

## 2020-09-28 DIAGNOSIS — O365930 Maternal care for other known or suspected poor fetal growth, third trimester, not applicable or unspecified: Secondary | ICD-10-CM | POA: Diagnosis not present

## 2020-09-28 DIAGNOSIS — Z364 Encounter for antenatal screening for fetal growth retardation: Secondary | ICD-10-CM

## 2020-09-28 DIAGNOSIS — O99333 Smoking (tobacco) complicating pregnancy, third trimester: Secondary | ICD-10-CM

## 2020-09-28 DIAGNOSIS — O99210 Obesity complicating pregnancy, unspecified trimester: Secondary | ICD-10-CM

## 2020-09-28 DIAGNOSIS — O10919 Unspecified pre-existing hypertension complicating pregnancy, unspecified trimester: Secondary | ICD-10-CM

## 2020-09-28 DIAGNOSIS — O0993 Supervision of high risk pregnancy, unspecified, third trimester: Secondary | ICD-10-CM

## 2020-09-28 DIAGNOSIS — O10013 Pre-existing essential hypertension complicating pregnancy, third trimester: Secondary | ICD-10-CM

## 2020-09-28 DIAGNOSIS — I10 Essential (primary) hypertension: Secondary | ICD-10-CM

## 2020-09-28 DIAGNOSIS — Z3A36 36 weeks gestation of pregnancy: Secondary | ICD-10-CM

## 2020-09-28 NOTE — Patient Instructions (Signed)
Cesarean Delivery Cesarean birth, or cesarean delivery, is the surgical delivery of a baby through an incision in the abdomen and the uterus. This may be referred to as a C-section. This procedure may be scheduled ahead of time, or it may be done inan emergency situation. Tell a health care provider about: Any allergies you have. All medicines you are taking, including vitamins, herbs, eye drops, creams, and over-the-counter medicines. Any problems you or family members have had with anesthetic medicines. Any blood disorders you have. Any surgeries you have had. Any medical conditions you have. Whether you or any members of your family have a history of deep vein thrombosis (DVT) or pulmonary embolism (PE). What are the risks? Generally, this is a safe procedure. However, problems may occur, including: Infection. Bleeding. Allergic reactions to medicines. Damage to other structures or organs. Blood clots. Injury to your baby. What happens before the procedure? General instructions Follow instructions from your health care provider about eating or drinking restrictions. If you know that you are going to have a cesarean delivery, do not shave your pubic area. Shaving before the procedure may increase your risk of infection. Plan to have someone take you home from the hospital. Ask your health care provider what steps will be taken to prevent infection. These may include: Removing hair at the surgery site. Washing skin with a germ-killing soap. Taking antibiotic medicine. Depending on the reason for your cesarean delivery, you may have a physical exam or additional testing, such as an ultrasound. You may have your blood or urine tested. Questions for your health care provider Ask your health care provider about: Changing or stopping your regular medicines. This is especially important if you are taking diabetes medicines or blood thinners. Your pain management plan. This is especially  important if you plan to breastfeed your baby. How long you will be in the hospital after the procedure. Any concerns you may have about receiving blood products, if you need them during the procedure. Cord blood banking, if you plan to collect your baby's umbilical cord blood. You may also want to ask your health care provider: Whether you will be able to hold or breastfeed your baby while you are still in the operating room. Whether your baby can stay with you immediately after the procedure and during your recovery. Whether a family member or a person of your choice can go with you into the operating room and stay with you during the procedure, immediately after the procedure, and during your recovery. What happens during the procedure?  An IV will be inserted into one of your veins. Fluid and medicines, such as antibiotics, will be given before the surgery. Fetal monitors will be placed on your abdomen to check your baby's heart rate. You may be given a special warming gown to wear to keep your temperature stable. A catheter may be inserted into your bladder through your urethra. This drains your urine during the procedure. You may be given one or more of the following: A medicine to numb the area (local anesthetic). A medicine to make you fall asleep (general anesthetic). A medicine (regional anesthetic) that is injected into your back or through a small thin tube placed in your back (spinal anesthetic or epidural anesthetic). This numbs everything below the injection site and allows you to stay awake during your procedure. If this makes you feel nauseous, tell your health care provider. Medicines will be available to help reduce any nausea you may feel. An incision will   be made in your abdomen, and then in your uterus. If you are awake during your procedure, you may feel tugging and pulling in your abdomen, but you should not feel pain. If you feel pain, tell your health care provider  immediately. Your baby will be removed from your uterus. You may feel more pressure or pushing while this happens. Immediately after birth, your baby will be dried and kept warm. You may be able to hold and breastfeed your baby. The umbilical cord may be clamped and cut during this time. This usually occurs after waiting a period of 1-2 minutes after delivery. Your placenta will be removed from your uterus. Your incisions will be closed with stitches (sutures). Staples, skin glue, or adhesive strips may also be applied to the incision in your abdomen. Bandages (dressings) may be placed over the incision in your abdomen. The procedure may vary among health care providers and hospitals. What happens after the procedure? Your blood pressure, heart rate, breathing rate, and blood oxygen level will be monitored until you are discharged from the hospital. You may continue to receive fluids and medicines through an IV. You will have some pain. Medicines will be available to help control your pain. To help prevent blood clots: You may be given medicines. You may have to wear compression stockings or devices. You will be encouraged to walk around when you are able. Hospital staff will encourage and support bonding with your baby. Your hospital may have you and your baby to stay in the same room (rooming in) during your hospital stay to encourage successful bonding and breastfeeding. You may be encouraged to cough and breathe deeply often. This helps to prevent lung problems. If you have a catheter draining your urine, it will be removed as soon as possible after your procedure. Summary Cesarean birth, or cesarean delivery, is the surgical delivery of a baby through an incision in the abdomen and the uterus. Follow instructions from your health care provider about eating or drinking restrictions before the procedure. You will have some pain after the procedure. Medicines will be available to help control  your pain. Hospital staff will encourage and support bonding with your baby after the procedure. Your hospital may have you and your baby to stay in the same room (rooming in) during your hospital stay to encourage successful bonding and breastfeeding. This information is not intended to replace advice given to you by your health care provider. Make sure you discuss any questions you have with your healthcare provider. Document Revised: 11/23/2019 Document Reviewed: 09/30/2017 Elsevier Patient Education  2022 Elsevier Inc.  

## 2020-09-28 NOTE — Progress Notes (Signed)
Routine Prenatal Care Visit  Subjective  Ashley Howell is a 30 y.o. [id number] at [redacted]w[redacted]d being seen today for ongoing prenatal care.  She is currently monitored for the following issues for this high-risk pregnancy and has History of cardiomegaly; Previous cesarean delivery, antepartum condition or complication; Obesity in pregnancy, antepartum; Chronic hypertension; PFO (patent foramen ovale); BMI 40.0-44.9, adult (HCC); Previous cesarean delivery, antepartum; Dysplasia of cervix, low grade (CIN 1); Bacterial vaginosis; Supervision of high-risk pregnancy, third trimester; ASD (atrial septal defect); Decreased fetal movement affecting management of pregnancy in second trimester; Pregnancy affected by fetal growth restriction; Gestational hypertension; and Poor fetal growth affecting management of mother in third trimester on their problem list.  ----------------------------------------------------------------------------------- Patient reports no complaints.   Contractions: Not present. Vag. Bleeding: None.  Movement: Present. Denies leaking of fluid.  ----------------------------------------------------------------------------------- The following portions of the patient's history were reviewed and updated as appropriate: allergies, current medications, past family history, past medical history, past social history, past surgical history and problem list. Problem list updated.   Objective  Blood pressure 128/72, height 5\' 7"  (1.702 m), weight 296 lb 9.6 oz (134.5 kg), last menstrual period 01/19/2020, not currently breastfeeding. Pregravid weight 280 lb (127 kg) Total Weight Gain 16 lb 9.6 oz (7.53 kg) Urinalysis:      Fetal Status: Fetal Heart Rate (bpm): 130   Movement: Present     General:  Alert, oriented and cooperative. Patient is in no acute distress.  Skin: Skin is warm and dry. No rash noted.   Cardiovascular: Normal heart rate noted  Respiratory: Normal respiratory effort, no  problems with respiration noted  Abdomen: Soft, gravid, appropriate for gestational age. Pain/Pressure: Absent     Pelvic:  Cervical exam deferred        Extremities: Normal range of motion.  Edema: None  Mental Status: Normal mood and affect. Normal behavior. Normal judgment and thought content.     Assessment   30 y.o. 37 at [redacted]w[redacted]d by  10/25/2020, by Last Menstrual Period presenting for routine prenatal visit  Plan   pregnancy6 Problems (from 01/19/20 to present)     Problem Noted Resolved   Pregnancy affected by fetal growth restriction 07/13/2020 by 09/12/2020, MD No   Overview Signed 07/13/2020  4:41 PM by 09/12/2020, MD    - followed by MFM       Supervision of high-risk pregnancy, third trimester 03/16/2020 by 14/11/2019, CNM No   Overview Addendum 08/24/2020  5:28 PM by 08/26/2020, MD     Nursing Staff Provider  Office Location  [facility] Dating   LMP = 10w Natale Milch  Language  English Anatomy US   with MFM - IUGR  Flu Vaccine   UTD (through employer) Genetic Screen  NIPS:   Negx3, XY  TDaP vaccine   08/10/2020 Hgb A1C or  GTT Early : 100 Third trimester : 79  Rhogam   n/a   LAB RESULTS   Feeding Plan  Blood Type O/Positive/-- (12/08 1432)   Contraception  BTL Antibody Negative (12/08 1432)  Circumcision  Rubella <0.90 (12/08 1432)  Pediatrician   RPR Non Reactive (12/08 1432)   Support Person  Charles HBsAg Negative (12/08 1432)   Prenatal Classes Discussed HIV Non Reactive (12/08 1432)  Covid vaccine  completed x2 Varicella  immune  BTL Consent  desires [x ] consents 06/23/2020 GBS  (For PCN allergy, check sensitivities)        VBAC Consent  n/a -  repeat cesarean Pap  NIL 2021    Hgb Electro      CF  negative     SMA  at risk - genetic counseling referral placed       High Risk Pregnancy Diagnoses  Hx of ASD/PFO- small on bubble study Hx of cesarean delivery x4 Maternal obesity - pregravid BMI >40 cHTN - nifedipine 90 mg daily (increased  from 30 to 90 by [facility] 08/17/2020) Hx of cardiomegaly  IUGR - noted at 19 weeks- following with MFM - see recs Tobacco use in pregnancy- quit 06/02/2020       Chronic hypertension 05/19/2018 by Jimmey Ralph, MD No   Overview Addendum 04/27/2020 10:46 AM by Zipporah Plants, CNM    [x]  Aspirin 81 mg daily after 12 weeks; discontinue after 36 weeks [x]  baseline labs with CBC, CMP, urine protein/creatinine ratio [ ]  no BP meds unless BPs become elevated - controlled with amlodipine 5 mg [ ]  ultrasound for growth at 28, 32, 36 weeks [x]  Baseline EKG - cards referral 12/8  Current antihypertensives:  amlodipine 5 mg Baseline and surveillance labs (pulled in from [facility], refresh links as needed)  Lab Results  Component Value Date   PLT 365 09/27/2019   CREATININE 0.70 05/19/2019   AST 21 05/19/2019   ALT 26 05/19/2019   PROTCRRATIO 0.14 10/09/2018   Antenatal Testing CHTN - O10.919  Group I  BP < 140/90, no preeclampsia, AGA,  nml AFV, +/- meds    Group II BP > 140/90, on meds, no preeclampsia, AGA, nml AFV  20-28-34-38  20-24-28-32-35-38  32//2 x wk  28//BPP wkly then 32//2 x wk  40 no meds; 39 meds  PRN or 37  Pre-eclampsia  GHTN - O13.9/Preeclampsia without severe features  - O14.00   Preeclampsia with severe features - O14.10  Q 3-4wks  Q 2 wks  28//BPP wkly then 32//2 x wk  Inpatient  37  PRN or 34         Obesity in pregnancy, antepartum 02/15/2015 by 12/10/2018, CNM No   Overview Addendum 07/13/2020  4:54 PM by 01-08-1971, MD    BMI >=40 [x]  early 1h gtt - ordered  [x]  u/s for dating [ ]   [x]  nutritional goals [x ] folic acid 1mg  [x]  bASA (>12 weeks) [x]  anesthesia consult  [ ]  repeat anesthesia consult @ 34 wks            NST: 130 bpm baseline, moderate variability, 15x15 accelerations, no decelerations.   Gestational age appropriate obstetric precautions including but not limited to vaginal bleeding, contractions, leaking of  fluid and fetal movement were reviewed in detail with the patient.    Return in about 6 days (around 10/04/2020) for ROB/NST with me, okay to double book.  13/11/2014 MD [facility], [facility] 09/28/2020, 3:48 PM

## 2020-09-29 ENCOUNTER — Encounter: Payer: Medicaid Other | Admitting: Obstetrics and Gynecology

## 2020-09-29 ENCOUNTER — Other Ambulatory Visit: Payer: Self-pay

## 2020-09-29 ENCOUNTER — Other Ambulatory Visit: Payer: Self-pay | Admitting: Obstetrics and Gynecology

## 2020-09-29 ENCOUNTER — Ambulatory Visit: Payer: Medicaid Other | Attending: Maternal & Fetal Medicine

## 2020-09-29 DIAGNOSIS — O365930 Maternal care for other known or suspected poor fetal growth, third trimester, not applicable or unspecified: Secondary | ICD-10-CM | POA: Insufficient documentation

## 2020-09-29 DIAGNOSIS — Z3A36 36 weeks gestation of pregnancy: Secondary | ICD-10-CM | POA: Insufficient documentation

## 2020-09-29 DIAGNOSIS — O365990 Maternal care for other known or suspected poor fetal growth, unspecified trimester, not applicable or unspecified: Secondary | ICD-10-CM

## 2020-09-30 ENCOUNTER — Encounter
Admission: RE | Admit: 2020-09-30 | Discharge: 2020-09-30 | Disposition: A | Discharge status: Home / Self Care | Payer: Medicaid Other | Source: Ambulatory Visit | Attending: Obstetrics and Gynecology | Admitting: Obstetrics and Gynecology

## 2020-09-30 HISTORY — DX: Atrial septal defect, unspecified: Q21.10

## 2020-09-30 HISTORY — DX: Atrial septal defect: Q21.1

## 2020-09-30 HISTORY — DX: Personal history of other diseases of the circulatory system: Z86.79

## 2020-09-30 HISTORY — DX: Other reaction to spinal and lumbar puncture: G97.1

## 2020-09-30 NOTE — Patient Instructions (Signed)
Your procedure is scheduled on: 10/06/20 Report to [facility], ENTER THRU THE EMERGENCY DEPARTMENT ARRIVAL FOR 5:30 AM [phone] for any questions regarding visitation policy  Remember: Instructions that are not followed completely may result in serious medical risk, up to and including death, or upon the discretion of your surgeon and anesthesiologist your surgery may need to be rescheduled.     _X__ 1. Do not eat food or drink any liquids after midnight the night before your procedure.                 No gum chewing or hard candies.   __X__2.  On the morning of surgery brush your teeth with toothpaste and water, you                 may rinse your mouth with mouthwash if you wish.  Do not swallow any              toothpaste of mouthwash.     _X__ 3.  No Alcohol for 24 hours before or after surgery.   _X__ 4.  Do Not Smoke or use e-cigarettes For 24 Hours Prior to Your Surgery.                 Do not use any chewable tobacco products for at least 6 hours prior to                 surgery.  ____  5.  Bring all medications with you on the day of surgery if instructed.   __X__  6.  Notify your doctor if there is any change in your medical condition      (cold, fever, infections).     Do not wear jewelry, make-up, hairpins, clips or nail polish. Do not wear lotions, powders, or perfumes.  Do not shave 48 hours prior to surgery. Men may shave face and neck. Do not bring valuables to the hospital.    [facility] is not responsible for any belongings or valuables.  Contacts, dentures/partials or body piercings may not be worn into surgery. Bring a case for your contacts, glasses or hearing aids, a denture cup will be supplied. Leave your suitcase in the car. After surgery it may be brought to your room. For patients admitted to the hospital, discharge time is determined by your treatment team.   Patients discharged the day of surgery will not be allowed to drive home.   Please read  over the following fact sheets that you were given:   CHG SOAP  __X__ Take these medicines the morning of surgery with A SIP OF WATER:    1. NIFEdipine (PROCARDIA-XL/NIFEDICAL-XL) 30 MG 24 hr tablet  2.   3.   4.  5.  6.  ____ Fleet Enema (as directed)   __X__ Use CHG Soap/SAGE wipes as directed  ____ Use inhalers on the day of surgery  ____ Stop metformin/Janumet/Farxiga 2 days prior to surgery    ____ Take 1/2 of usual insulin dose the night before surgery. No insulin the morning          of surgery.   ____ Stop Blood Thinners Coumadin/Plavix/Xarelto/Pleta/Pradaxa/Eliquis/Effient/Aspirin  on   Or contact your Surgeon, Cardiologist or Medical Doctor regarding  ability to stop your blood thinners  __X__ Stop Anti-inflammatories 7 days before surgery such as Advil, Ibuprofen, Motrin,  BC or Goodies Powder, Naprosyn, Naproxen, Aleve    __X__ Stop all herbal supplements, fish oil or vitamin E until after surgery.  ____ Bring C-Pap to the hospital.

## 2020-09-30 NOTE — Pre-Procedure Instructions (Signed)
Attempts made to contact patient for her pre op interview. Number noted in EPIC OR scheduling as "best contact"   incorrect. Female answered and stated we had the wrong number. Different number found on facesheet. AM identified patient msg was left. Mother identified as a contact person for patient. She was called and validated we had correct number for patient and stated she would call and let the patient know we were attempting to reach her. No response ,another attempt to reach patient direclty was made and again AM picked up, left another detailed message to call PAT to complete interview for her upcoming procedure

## 2020-10-04 ENCOUNTER — Other Ambulatory Visit: Payer: Medicaid Other

## 2020-10-04 ENCOUNTER — Other Ambulatory Visit: Payer: Self-pay

## 2020-10-04 ENCOUNTER — Other Ambulatory Visit
Admission: RE | Admit: 2020-10-04 | Discharge: 2020-10-04 | Disposition: A | Discharge status: Home / Self Care | Payer: Medicaid Other | Source: Ambulatory Visit | Attending: Obstetrics and Gynecology | Admitting: Obstetrics and Gynecology

## 2020-10-04 ENCOUNTER — Encounter: Payer: Medicaid Other | Admitting: Obstetrics and Gynecology

## 2020-10-04 DIAGNOSIS — Z20822 Contact with and (suspected) exposure to covid-19: Secondary | ICD-10-CM | POA: Diagnosis not present

## 2020-10-04 DIAGNOSIS — Z01812 Encounter for preprocedural laboratory examination: Secondary | ICD-10-CM | POA: Diagnosis present

## 2020-10-04 LAB — CBC
HCT: 35 % — ABNORMAL LOW (ref 36.0–46.0)
Hemoglobin: 11.7 g/dL — ABNORMAL LOW (ref 12.0–15.0)
MCH: 27 pg (ref 26.0–34.0)
MCHC: 33.4 g/dL (ref 30.0–36.0)
MCV: 80.6 fL (ref 80.0–100.0)
Platelets: 347 10*3/uL (ref 150–400)
RBC: 4.34 MIL/uL (ref 3.87–5.11)
RDW: 13.5 % (ref 11.5–15.5)
WBC: 13.2 10*3/uL — ABNORMAL HIGH (ref 4.0–10.5)
nRBC: 0 % (ref 0.0–0.2)

## 2020-10-04 LAB — RAPID HIV SCREEN (HIV 1/2 AB+AG)
HIV 1/2 Antibodies: NONREACTIVE
HIV-1 P24 Antigen - HIV24: NONREACTIVE

## 2020-10-04 LAB — TYPE AND SCREEN
ABO/RH(D): O POS
Antibody Screen: NEGATIVE
Extend sample reason: UNDETERMINED

## 2020-10-04 LAB — BASIC METABOLIC PANEL
Anion gap: 8 (ref 5–15)
BUN: 6 mg/dL (ref 6–20)
CO2: 21 mmol/L — ABNORMAL LOW (ref 22–32)
Calcium: 9 mg/dL (ref 8.9–10.3)
Chloride: 106 mmol/L (ref 98–111)
Creatinine, Ser: 0.54 mg/dL (ref 0.44–1.00)
GFR, Estimated: >60 mL/min (ref >60)
Glucose, Bld: 87 mg/dL (ref 70–99)
Potassium: 3.2 mmol/L — ABNORMAL LOW (ref 3.5–5.1)
Sodium: 135 mmol/L (ref 135–145)

## 2020-10-05 ENCOUNTER — Telehealth: Payer: Self-pay

## 2020-10-05 LAB — RPR: RPR Ser Ql: NONREACTIVE

## 2020-10-05 LAB — SARS CORONAVIRUS 2 (TAT 6-24 HRS): SARS Coronavirus 2: NEGATIVE

## 2020-10-05 NOTE — Telephone Encounter (Signed)
Pt aware.

## 2020-10-05 NOTE — Telephone Encounter (Signed)
Pt calling; CRS left message in MyChart; has question.  [phone]  Exactly when does she need to take the potassium - before midnight tonight or right before she comes in for surgery?

## 2020-10-05 NOTE — Telephone Encounter (Signed)
Today before midnight

## 2020-10-06 ENCOUNTER — Inpatient Hospital Stay: Payer: Medicaid Other | Admitting: Urgent Care

## 2020-10-06 ENCOUNTER — Inpatient Hospital Stay: Payer: Medicaid Other | Admitting: Anesthesiology

## 2020-10-06 ENCOUNTER — Inpatient Hospital Stay
Admission: RE | Admit: 2020-10-06 | Discharge: 2020-10-08 | DRG: 784 | Disposition: A | Discharge status: Home / Self Care | Payer: Medicaid Other | Attending: Obstetrics and Gynecology | Admitting: Obstetrics and Gynecology

## 2020-10-06 ENCOUNTER — Encounter: Payer: Self-pay | Admitting: Obstetrics and Gynecology

## 2020-10-06 ENCOUNTER — Encounter
Admission: RE | Disposition: A | Discharge status: Home / Self Care | Payer: Self-pay | Source: Home / Self Care | Attending: Obstetrics and Gynecology

## 2020-10-06 ENCOUNTER — Other Ambulatory Visit: Payer: Self-pay

## 2020-10-06 DIAGNOSIS — F17210 Nicotine dependence, cigarettes, uncomplicated: Secondary | ICD-10-CM | POA: Diagnosis present

## 2020-10-06 DIAGNOSIS — Z23 Encounter for immunization: Secondary | ICD-10-CM

## 2020-10-06 DIAGNOSIS — O1002 Pre-existing essential hypertension complicating childbirth: Secondary | ICD-10-CM | POA: Diagnosis present

## 2020-10-06 DIAGNOSIS — Z20822 Contact with and (suspected) exposure to covid-19: Secondary | ICD-10-CM | POA: Diagnosis present

## 2020-10-06 DIAGNOSIS — O1092 Unspecified pre-existing hypertension complicating childbirth: Secondary | ICD-10-CM | POA: Diagnosis not present

## 2020-10-06 DIAGNOSIS — O99214 Obesity complicating childbirth: Secondary | ICD-10-CM | POA: Diagnosis present

## 2020-10-06 DIAGNOSIS — I10 Essential (primary) hypertension: Secondary | ICD-10-CM | POA: Diagnosis present

## 2020-10-06 DIAGNOSIS — Z98891 History of uterine scar from previous surgery: Secondary | ICD-10-CM

## 2020-10-06 DIAGNOSIS — O365930 Maternal care for other known or suspected poor fetal growth, third trimester, not applicable or unspecified: Secondary | ICD-10-CM | POA: Diagnosis present

## 2020-10-06 DIAGNOSIS — Z3A37 37 weeks gestation of pregnancy: Secondary | ICD-10-CM

## 2020-10-06 DIAGNOSIS — O365990 Maternal care for other known or suspected poor fetal growth, unspecified trimester, not applicable or unspecified: Secondary | ICD-10-CM | POA: Diagnosis present

## 2020-10-06 DIAGNOSIS — Z302 Encounter for sterilization: Secondary | ICD-10-CM

## 2020-10-06 DIAGNOSIS — O99334 Smoking (tobacco) complicating childbirth: Secondary | ICD-10-CM | POA: Diagnosis present

## 2020-10-06 DIAGNOSIS — O99210 Obesity complicating pregnancy, unspecified trimester: Secondary | ICD-10-CM | POA: Diagnosis present

## 2020-10-06 DIAGNOSIS — D62 Acute posthemorrhagic anemia: Secondary | ICD-10-CM | POA: Diagnosis not present

## 2020-10-06 DIAGNOSIS — O34211 Maternal care for low transverse scar from previous cesarean delivery: Secondary | ICD-10-CM | POA: Diagnosis present

## 2020-10-06 DIAGNOSIS — O0993 Supervision of high risk pregnancy, unspecified, third trimester: Secondary | ICD-10-CM

## 2020-10-06 DIAGNOSIS — O9081 Anemia of the puerperium: Secondary | ICD-10-CM | POA: Diagnosis not present

## 2020-10-06 SURGERY — Surgical Case
Anesthesia: Spinal | Laterality: Bilateral

## 2020-10-06 MED ORDER — ONDANSETRON HCL 4 MG/2ML IJ SOLN: 4.0000 mg | Freq: Three times a day (TID) | INTRAMUSCULAR | Status: DC | PRN

## 2020-10-06 MED ORDER — FERROUS SULFATE 325 (65 FE) MG PO TABS
325.0000 mg | ORAL_TABLET | Freq: Two times a day (BID) | ORAL | Status: DC
  Administered 2020-10-06 – 2020-10-08 (×4): 325 mg via ORAL
  Filled 2020-10-06 (×4): qty 1

## 2020-10-06 MED ORDER — SOD CITRATE-CITRIC ACID 500-334 MG/5ML PO SOLN
ORAL | Status: AC
  Administered 2020-10-06: 30 mL via ORAL
  Filled 2020-10-06: qty 15

## 2020-10-06 MED ORDER — MORPHINE SULFATE (PF) 0.5 MG/ML IJ SOLN
INTRAMUSCULAR | Status: AC
  Filled 2020-10-06: qty 10

## 2020-10-06 MED ORDER — BUPIVACAINE HCL (PF) 0.5 % IJ SOLN
INTRAMUSCULAR | Status: AC
  Filled 2020-10-06: qty 30

## 2020-10-06 MED ORDER — OXYTOCIN-SODIUM CHLORIDE 30-0.9 UT/500ML-% IV SOLN
2.5000 [IU]/h | INTRAVENOUS | Status: AC
  Administered 2020-10-06: 2.5 [IU]/h via INTRAVENOUS

## 2020-10-06 MED ORDER — COCONUT OIL OIL: 1.0000 "application " | TOPICAL_OIL | Status: DC | PRN

## 2020-10-06 MED ORDER — LACTATED RINGERS IV SOLN: INTRAVENOUS | Status: DC

## 2020-10-06 MED ORDER — KETOROLAC TROMETHAMINE 30 MG/ML IJ SOLN
INTRAMUSCULAR | Status: AC
  Filled 2020-10-06: qty 1

## 2020-10-06 MED ORDER — OXYTOCIN-SODIUM CHLORIDE 30-0.9 UT/500ML-% IV SOLN
INTRAVENOUS | Status: DC | PRN
  Administered 2020-10-06: 30 mL via INTRAVENOUS

## 2020-10-06 MED ORDER — ACETAMINOPHEN 500 MG PO TABS
1000.0000 mg | ORAL_TABLET | Freq: Four times a day (QID) | ORAL | Status: AC
  Administered 2020-10-06 – 2020-10-07 (×4): 1000 mg via ORAL
  Filled 2020-10-06 (×5): qty 2

## 2020-10-06 MED ORDER — ZOLPIDEM TARTRATE 5 MG PO TABS: 5.0000 mg | ORAL_TABLET | Freq: Every evening | ORAL | Status: DC | PRN

## 2020-10-06 MED ORDER — FENTANYL CITRATE (PF) 100 MCG/2ML IJ SOLN
INTRAMUSCULAR | Status: AC
  Filled 2020-10-06: qty 2

## 2020-10-06 MED ORDER — PRENATAL MULTIVITAMIN CH
1.0000 | ORAL_TABLET | Freq: Every day | ORAL | Status: DC
  Administered 2020-10-06 – 2020-10-08 (×3): 1 via ORAL
  Filled 2020-10-06 (×4): qty 1

## 2020-10-06 MED ORDER — MEPERIDINE HCL 25 MG/ML IJ SOLN: 6.2500 mg | INTRAMUSCULAR | Status: DC | PRN

## 2020-10-06 MED ORDER — WITCH HAZEL-GLYCERIN EX PADS: 1.0000 "application " | MEDICATED_PAD | CUTANEOUS | Status: DC | PRN

## 2020-10-06 MED ORDER — DIPHENHYDRAMINE HCL 25 MG PO CAPS
25.0000 mg | ORAL_CAPSULE | Freq: Four times a day (QID) | ORAL | Status: DC | PRN
  Administered 2020-10-06: 25 mg via ORAL

## 2020-10-06 MED ORDER — NALOXONE HCL 0.4 MG/ML IJ SOLN: 0.4000 mg | INTRAMUSCULAR | Status: DC | PRN

## 2020-10-06 MED ORDER — KETOROLAC TROMETHAMINE 30 MG/ML IJ SOLN: 30.0000 mg | Freq: Four times a day (QID) | INTRAMUSCULAR | Status: DC | PRN

## 2020-10-06 MED ORDER — BUPIVACAINE HCL (PF) 0.5 % IJ SOLN
INTRAMUSCULAR | Status: DC | PRN
  Administered 2020-10-06: 10 mL

## 2020-10-06 MED ORDER — SCOPOLAMINE 1 MG/3DAYS TD PT72: 1.0000 | MEDICATED_PATCH | Freq: Once | TRANSDERMAL | Status: DC

## 2020-10-06 MED ORDER — NALBUPHINE HCL 10 MG/ML IJ SOLN: 5.0000 mg | INTRAMUSCULAR | Status: DC | PRN

## 2020-10-06 MED ORDER — MISOPROSTOL 200 MCG PO TABS
ORAL_TABLET | ORAL | Status: AC
  Filled 2020-10-06: qty 5

## 2020-10-06 MED ORDER — IBUPROFEN 600 MG PO TABS
600.0000 mg | ORAL_TABLET | Freq: Four times a day (QID) | ORAL | Status: DC
  Administered 2020-10-06 – 2020-10-08 (×7): 600 mg via ORAL
  Filled 2020-10-06 (×8): qty 1

## 2020-10-06 MED ORDER — LIDOCAINE HCL (PF) 1 % IJ SOLN
INTRAMUSCULAR | Status: DC | PRN
  Administered 2020-10-06: 3 mL via SUBCUTANEOUS

## 2020-10-06 MED ORDER — ONDANSETRON HCL 4 MG/2ML IJ SOLN
INTRAMUSCULAR | Status: AC
  Filled 2020-10-06: qty 2

## 2020-10-06 MED ORDER — SENNOSIDES-DOCUSATE SODIUM 8.6-50 MG PO TABS
2.0000 | ORAL_TABLET | ORAL | Status: DC
  Administered 2020-10-06 – 2020-10-07 (×2): 2 via ORAL
  Filled 2020-10-06 (×2): qty 2

## 2020-10-06 MED ORDER — NALOXONE HCL 4 MG/10ML IJ SOLN
1.0000 ug/kg/h | INTRAVENOUS | Status: DC | PRN
  Filled 2020-10-06: qty 5

## 2020-10-06 MED ORDER — LACTATED RINGERS IV SOLN: Freq: Once | INTRAVENOUS | Status: AC

## 2020-10-06 MED ORDER — DIPHENHYDRAMINE HCL 50 MG/ML IJ SOLN
INTRAMUSCULAR | Status: AC
  Filled 2020-10-06: qty 1

## 2020-10-06 MED ORDER — SIMETHICONE 80 MG PO CHEW
80.0000 mg | CHEWABLE_TABLET | ORAL | Status: DC | PRN
  Filled 2020-10-06: qty 1

## 2020-10-06 MED ORDER — FLEET ENEMA 7-19 GM/118ML RE ENEM: 1.0000 | ENEMA | Freq: Every day | RECTAL | Status: DC | PRN

## 2020-10-06 MED ORDER — CARBOPROST TROMETHAMINE 250 MCG/ML IM SOLN
INTRAMUSCULAR | Status: AC
  Filled 2020-10-06: qty 1

## 2020-10-06 MED ORDER — KETOROLAC TROMETHAMINE 30 MG/ML IJ SOLN
INTRAMUSCULAR | Status: DC | PRN
  Administered 2020-10-06: 30 mg via INTRAVENOUS

## 2020-10-06 MED ORDER — BUPIVACAINE IN DEXTROSE 0.75-8.25 % IT SOLN
INTRATHECAL | Status: DC | PRN
  Administered 2020-10-06: 1.7 mL via INTRATHECAL

## 2020-10-06 MED ORDER — MENTHOL 3 MG MT LOZG
1.0000 | LOZENGE | OROMUCOSAL | Status: DC | PRN
  Filled 2020-10-06: qty 9

## 2020-10-06 MED ORDER — SOD CITRATE-CITRIC ACID 500-334 MG/5ML PO SOLN: 30.0000 mL | ORAL | Status: AC

## 2020-10-06 MED ORDER — BUPIVACAINE 0.25 % ON-Q PUMP DUAL CATH 400 ML
400.0000 mL | INJECTION | Status: DC
  Filled 2020-10-06: qty 400

## 2020-10-06 MED ORDER — SODIUM CHLORIDE 0.9% FLUSH: 3.0000 mL | INTRAVENOUS | Status: DC | PRN

## 2020-10-06 MED ORDER — CEFAZOLIN SODIUM-DEXTROSE 2-4 GM/100ML-% IV SOLN
2.0000 g | INTRAVENOUS | Status: AC
  Administered 2020-10-06: 2 g via INTRAVENOUS
  Filled 2020-10-06: qty 100

## 2020-10-06 MED ORDER — NALBUPHINE HCL 10 MG/ML IJ SOLN: 5.0000 mg | Freq: Once | INTRAMUSCULAR | Status: DC | PRN

## 2020-10-06 MED ORDER — FENTANYL CITRATE (PF) 100 MCG/2ML IJ SOLN
INTRAMUSCULAR | Status: DC | PRN
  Administered 2020-10-06: 50 ug via INTRAVENOUS

## 2020-10-06 MED ORDER — SIMETHICONE 80 MG PO CHEW
80.0000 mg | CHEWABLE_TABLET | Freq: Three times a day (TID) | ORAL | Status: DC
  Administered 2020-10-06 – 2020-10-08 (×6): 80 mg via ORAL
  Filled 2020-10-06 (×5): qty 1

## 2020-10-06 MED ORDER — DIPHENHYDRAMINE HCL 25 MG PO CAPS
25.0000 mg | ORAL_CAPSULE | ORAL | Status: DC | PRN
  Filled 2020-10-06: qty 1

## 2020-10-06 MED ORDER — LACTATED RINGERS IV SOLN: INTRAVENOUS | Status: DC | PRN

## 2020-10-06 MED ORDER — OXYTOCIN-SODIUM CHLORIDE 30-0.9 UT/500ML-% IV SOLN
INTRAVENOUS | Status: AC
  Filled 2020-10-06: qty 500

## 2020-10-06 MED ORDER — SODIUM CHLORIDE 0.9 % IV SOLN
INTRAVENOUS | Status: DC | PRN
  Administered 2020-10-06: 50 ug/min via INTRAVENOUS

## 2020-10-06 MED ORDER — ONDANSETRON HCL 4 MG/2ML IJ SOLN
INTRAMUSCULAR | Status: DC | PRN
  Administered 2020-10-06: 4 mg via INTRAVENOUS

## 2020-10-06 MED ORDER — FENTANYL CITRATE (PF) 100 MCG/2ML IJ SOLN
INTRAMUSCULAR | Status: DC | PRN
  Administered 2020-10-06: 15 ug via INTRATHECAL

## 2020-10-06 MED ORDER — DIPHENHYDRAMINE HCL 50 MG/ML IJ SOLN: 12.5000 mg | INTRAMUSCULAR | Status: DC | PRN

## 2020-10-06 MED ORDER — BUPIVACAINE ON-Q PAIN PUMP (FOR ORDER SET NO CHG): INJECTION | Status: DC

## 2020-10-06 MED ORDER — MORPHINE SULFATE (PF) 0.5 MG/ML IJ SOLN
INTRAMUSCULAR | Status: DC | PRN
  Administered 2020-10-06: .1 mg via INTRATHECAL

## 2020-10-06 MED ORDER — DIBUCAINE (PERIANAL) 1 % EX OINT
1.0000 | TOPICAL_OINTMENT | CUTANEOUS | Status: DC | PRN
Start: 2020-10-06 — End: 2020-10-08

## 2020-10-06 MED ORDER — OXYCODONE HCL 5 MG PO TABS
5.0000 mg | ORAL_TABLET | ORAL | Status: DC | PRN
  Administered 2020-10-07: 10 mg via ORAL
  Administered 2020-10-07: 5 mg via ORAL
  Administered 2020-10-07: 10 mg via ORAL
  Administered 2020-10-07: 5 mg via ORAL
  Administered 2020-10-08: 10 mg via ORAL
  Administered 2020-10-08 (×2): 5 mg via ORAL
  Filled 2020-10-06 (×3): qty 2
  Filled 2020-10-06: qty 1
  Filled 2020-10-06: qty 2
  Filled 2020-10-06 (×2): qty 1

## 2020-10-06 MED ORDER — NIFEDIPINE ER OSMOTIC RELEASE 30 MG PO TB24
90.0000 mg | ORAL_TABLET | Freq: Every day | ORAL | Status: DC
  Administered 2020-10-07 – 2020-10-08 (×2): 90 mg via ORAL
  Filled 2020-10-06 (×2): qty 3

## 2020-10-06 MED ORDER — HYDROMORPHONE HCL 1 MG/ML IJ SOLN: 0.2000 mg | INTRAMUSCULAR | Status: DC | PRN

## 2020-10-06 MED ORDER — NALBUPHINE HCL 10 MG/ML IJ SOLN
5.0000 mg | Freq: Once | INTRAMUSCULAR | Status: DC | PRN
Start: 2020-10-06 — End: 2020-10-08

## 2020-10-06 MED ORDER — DIPHENHYDRAMINE HCL 50 MG/ML IJ SOLN
INTRAMUSCULAR | Status: DC | PRN
  Administered 2020-10-06: 12.5 mg via INTRAVENOUS

## 2020-10-06 MED ORDER — BISACODYL 10 MG RE SUPP: 10.0000 mg | Freq: Every day | RECTAL | Status: DC | PRN

## 2020-10-06 MED ORDER — CEFAZOLIN SODIUM-DEXTROSE 1-4 GM/50ML-% IV SOLN
INTRAVENOUS | Status: DC | PRN
  Administered 2020-10-06: 1 g via INTRAVENOUS

## 2020-10-06 MED ORDER — PHENYLEPHRINE HCL (PRESSORS) 10 MG/ML IV SOLN
INTRAVENOUS | Status: DC | PRN
  Administered 2020-10-06: 100 ug via INTRAVENOUS

## 2020-10-06 MED ORDER — MEASLES, MUMPS & RUBELLA VAC IJ SOLR
0.5000 mL | INTRAMUSCULAR | Status: AC | PRN
  Administered 2020-10-08: 0.5 mL via SUBCUTANEOUS
  Filled 2020-10-06: qty 0.5

## 2020-10-06 MED ORDER — METHYLERGONOVINE MALEATE 0.2 MG/ML IJ SOLN
INTRAMUSCULAR | Status: AC
  Filled 2020-10-06: qty 1

## 2020-10-06 SURGICAL SUPPLY — 35 items
BACTOSHIELD CHG 4% 4OZ (MISCELLANEOUS) ×2
CHLORAPREP W/TINT 26 (MISCELLANEOUS) ×6 IMPLANT
DERMABOND ADHESIVE PROPEN (GAUZE/BANDAGES/DRESSINGS) ×2
DERMABOND ADVANCED (GAUZE/BANDAGES/DRESSINGS) ×2
DERMABOND ADVANCED .7 DNX12 (GAUZE/BANDAGES/DRESSINGS) ×1 IMPLANT
DERMABOND ADVANCED .7 DNX6 (GAUZE/BANDAGES/DRESSINGS) ×1 IMPLANT
DRESSING PEEL AND PLAC PRVNA20 (GAUZE/BANDAGES/DRESSINGS) ×2 IMPLANT
DRSG OPSITE POSTOP 4X10 (GAUZE/BANDAGES/DRESSINGS) ×3 IMPLANT
DRSG PEEL AND PLACE PREVENA 20 (GAUZE/BANDAGES/DRESSINGS) ×6
DRSG TELFA 3X8 NADH (GAUZE/BANDAGES/DRESSINGS) ×3 IMPLANT
ELECT CAUTERY BLADE 6.4 (BLADE) ×3 IMPLANT
ELECT REM PT RETURN 9FT ADLT (ELECTROSURGICAL) ×3
ELECTRODE REM PT RTRN 9FT ADLT (ELECTROSURGICAL) ×1 IMPLANT
GAUZE SPONGE 4X4 12PLY STRL (GAUZE/BANDAGES/DRESSINGS) ×3 IMPLANT
GLOVE SURG UNDER POLY LF SZ6.5 (GLOVE) ×6 IMPLANT
GOWN STRL REUS W/ TWL LRG LVL3 (GOWN DISPOSABLE) ×1 IMPLANT
GOWN STRL REUS W/ TWL XL LVL3 (GOWN DISPOSABLE) ×2 IMPLANT
GOWN STRL REUS W/TWL LRG LVL3 (GOWN DISPOSABLE) ×2
GOWN STRL REUS W/TWL XL LVL3 (GOWN DISPOSABLE) ×4
MANIFOLD NEPTUNE II (INSTRUMENTS) ×3 IMPLANT
MAT PREVALON FULL STRYKER (MISCELLANEOUS) ×3 IMPLANT
NS IRRIG 1000ML POUR BTL (IV SOLUTION) ×3 IMPLANT
PACK C SECTION AR (MISCELLANEOUS) ×3 IMPLANT
PAD OB MATERNITY 4.3X12.25 (PERSONAL CARE ITEMS) ×3 IMPLANT
PAD PREP 24X41 OB/GYN DISP (PERSONAL CARE ITEMS) ×3 IMPLANT
PENCIL SMOKE EVACUATOR (MISCELLANEOUS) ×3 IMPLANT
RTRCTR C-SECT PINK 25CM LRG (MISCELLANEOUS) ×3 IMPLANT
SCRUB CHG 4% DYNA-HEX 4OZ (MISCELLANEOUS) ×1 IMPLANT
SUT CHROMIC 0 CT 1 (SUTURE) ×3 IMPLANT
SUT MNCRL AB 4-0 PS2 18 (SUTURE) ×3 IMPLANT
SUT PDS AB 1 TP1 96 (SUTURE) ×3 IMPLANT
SUT PLAIN 3-0 (SUTURE) ×3 IMPLANT
SUT VIC AB 0 CT1 36 (SUTURE) ×9 IMPLANT
SUT VIC AB 2-0 CT1 36 (SUTURE) ×3 IMPLANT
SYR 30ML LL (SYRINGE) ×6 IMPLANT

## 2020-10-06 NOTE — Anesthesia Postprocedure Evaluation (Signed)
Anesthesia Post Note  Patient: Ashley Howell  Procedure(s) Performed: CESAREAN SECTION WITH BILATERAL TUBAL LIGATION (Bilateral)  Patient location during evaluation: Mother Baby Anesthesia Type: Spinal Level of consciousness: oriented and awake and alert Pain management: pain level controlled Vital Signs Assessment: post-procedure vital signs reviewed and stable Respiratory status: spontaneous breathing and respiratory function stable Cardiovascular status: blood pressure returned to baseline and stable Postop Assessment: no headache, no backache, no apparent nausea or vomiting and able to ambulate Anesthetic complications: no   No notable events documented.   Last Vitals:  Vitals:   10/06/20 1249 10/06/20 1352  BP: (!) 106/58 122/70  Pulse: 84 80  Resp: 16 15  Temp: 36.8 C 36.7 C  SpO2: 99% 99%    Last Pain:  Vitals:   10/06/20 1352  TempSrc: Oral  PainSc:                  Ashley Howell

## 2020-10-06 NOTE — Anesthesia Procedure Notes (Signed)
Spinal  Patient location during procedure: OR Start time: 10/06/2020 7:54 AM End time: 10/06/2020 7:56 AM Reason for block: surgical anesthesia Staffing Performed: resident/CRNA  Anesthesiologist: Alphonsus Sias, MD Resident/CRNA: Hedda Slade, CRNA Preanesthetic Checklist Completed: patient identified, IV checked, site marked, risks and benefits discussed, surgical consent, monitors and equipment checked, pre-op evaluation and timeout performed Spinal Block Patient position: sitting Prep: ChloraPrep Patient monitoring: heart rate, continuous pulse ox, blood pressure and cardiac monitor Approach: midline Location: L3-4 Injection technique: single-shot Needle Needle type: Whitacre and Introducer  Needle gauge: 24 G Needle length: 9 cm Assessment Events: CSF return Additional Notes Negative paresthesia. Negative blood return. Positive free-flowing CSF. Expiration date of kit checked and confirmed. Patient tolerated procedure well, without complications.

## 2020-10-06 NOTE — Lactation Note (Signed)
This note was copied from a baby's chart. Lactation Consultation Note  Patient Name: Ashley Howell [id number] Date: 10/06/2020 Reason for consult: L&D Initial assessment;Initial assessment;Early term 37-38.6wks;Infant < 6lbs;Other (Comment) (IUGR) Age:30 hours  Initial lactation visit in L&D. Mom is G7P5, rpt C-section at [redacted]w[redacted]d d/t IUGR. Baby is <5lbs, and will have to have blood glucose level tested.   Mom BF her other children, the longest for 3 months- desires a longer BF journey this time due to infant's size.  Mom attempted independently to get baby to latch, baby appeared uninterested and sleepy. LC stepped in and offered to help with feeding attempt- Nipple rubbed nose to chin, hand expressed colostrum onto tip of nipple. Baby did open and accepted nipple- rhythmic sucking pattern established and audible swallows noted. Parents advised to keep infant awake and alert, breast compression/massage to help move colostrum for baby, and note end of feeding time- encouraging baby to feed as long as he will.   LC got called away for another assistance with another mom. Transition RN updated, and mindful of start time.  Maternal Data Has patient been taught Hand Expression?: Yes Does the patient have breastfeeding experience prior to this delivery?: Yes How long did the patient breastfeed?: 3 months max  Feeding Mother's Current Feeding Choice: Breast Milk and Formula  LATCH Score Latch: Repeated attempts needed to sustain latch, nipple held in mouth throughout feeding, stimulation needed to elicit sucking reflex.  Audible Swallowing: A few with stimulation  Type of Nipple: Everted at rest and after stimulation  Comfort (Breast/Nipple): Soft / non-tender  Hold (Positioning): Assistance needed to correctly position infant at breast and maintain latch.  LATCH Score: 7   Lactation Tools Discussed/Used    Interventions Interventions: Breast feeding basics reviewed;Assisted with  latch;Hand express;Breast compression;Support pillows;Education  Discharge    Consult Status Consult Status: Follow-up Date: 10/06/20 Follow-up type: In-patient    Danford Bad 10/06/2020, 10:42 AM

## 2020-10-06 NOTE — Anesthesia Preprocedure Evaluation (Signed)
Anesthesia Evaluation  Patient identified by MRN, date of birth, ID band Patient awake    Reviewed: Allergy & Precautions, H&P , NPO status , reviewed documented beta blocker date and time   History of Anesthesia Complications (+) POST - OP SPINAL HEADACHE and history of anesthetic complications  Airway Mallampati: II  TM Distance: >3 FB Neck ROM: limited    Dental  (+) Teeth Intact   Pulmonary shortness of breath, Current Smoker and Patient abstained from smoking.,    Pulmonary exam normal        Cardiovascular hypertension, Normal cardiovascular exam  12/2018 ECHO IMPRESSIONS    1. Left ventricular ejection fraction, by visual estimation, is 60 to  65%. The left ventricle has normal function. Normal left ventricular size.  There is no left ventricular hypertrophy.  2. Global right ventricle has normal systolic function.The right  ventricular size is normal. No increase in right ventricular wall  thickness.  3. Left atrial size was normal.  4. Saline contrast bubble study was performed which was nagative. No ASD  noted. Low suspicion of PFO given negative contrast study, though a very  small PFO can not be definatively excluded.    Neuro/Psych  Headaches, PSYCHIATRIC DISORDERS Anxiety    GI/Hepatic neg GERD  ,  Endo/Other  Morbid obesity  Renal/GU      Musculoskeletal   Abdominal   Peds  Hematology   Anesthesia Other Findings Past Medical History: No date: Anxiety No date: ASD (atrial septal defect) 05/19/2018: Benign essential hypertension, antepartum     Comment:  160/99 at anatomy scan 18 weeks   Pt sent to ER on               05/15/2018 resolved with rest   Elevated at u/s visit -               labetalol 100 mg bid initiated No date: Hx of cardiomegaly No date: Hypertension No date: PFO (patent foramen ovale)     Comment:  a. 12/2014 Echo: EF 55-60%, mildly dil LA. PFO w/o               significant  shunting (reviewed by Dr. Mariah Milling 07/2018). No date: Shortness of breath dyspnea No date: Spinal headache  Past Surgical History: 10/28/2008: CESAREAN SECTION 02/22/2015: CESAREAN SECTION; N/A     Comment:  Procedure: CESAREAN SECTION;  Surgeon: Nadara Mustard,               MD;  Location: [facility] ORS;  Service: Obstetrics;                Laterality: N/A; 04/13/2010: CESAREAN SECTION 10/09/2018: CESAREAN SECTION; N/A     Comment:  Procedure: CESAREAN SECTION;  Surgeon: Natale Milch, MD;  Location: [facility] ORS;  Service:               Obstetrics;  Laterality: N/A;  BMI    Body Mass Index: 46.27 kg/m      Reproductive/Obstetrics                             Anesthesia Physical Anesthesia Plan  ASA: 3  Anesthesia Plan: Spinal   Post-op Pain Management:    Induction: Intravenous  PONV Risk Score and Plan: 3 and Ondansetron and Treatment may vary due to age or medical condition  Airway Management Planned: Nasal  Cannula and Natural Airway  Additional Equipment:   Intra-op Plan:   Post-operative Plan:   Informed Consent: I have reviewed the patients History and Physical, chart, labs and discussed the procedure including the risks, benefits and alternatives for the proposed anesthesia with the patient or authorized representative who has indicated his/her understanding and acceptance.     Dental Advisory Given  Plan Discussed with: CRNA  Anesthesia Plan Comments:         Anesthesia Quick Evaluation

## 2020-10-06 NOTE — Op Note (Signed)
Cesarean Section Procedure Note 10/06/20  Pre-operative Diagnosis:  1. History of cesarean section  2. [redacted] weeks gestation  3. Desire for Sterilization  4. Chronic HTN  5. IUGR Post-operative Diagnosis: same, delivered. Procedure: Repeat Low Transverse Cesarean Section and bilateral tubal ligation Surgeon: Adelene Idler MD  Assistant(s): [name] CNM - No other skilled surgical assistant available. Anesthesia: Spinal Estimated Blood Loss: 355 cc Complications: None; patient tolerated the procedure well.  Disposition: PACU - hemodynamically stable. Condition: stable   Findings: A female infant in the cephalic presentation. Amniotic fluid - clear  Birth weight: 4 lbs 15 oz Apgars of 9 and 10.   Intact placenta with a three-vessel cord. Grossly normal uterus, tubes and ovaries bilaterally.   No intraabdominal adhesions were noted.   Procedure Details    The patient was taken to operating room, identified as the correct patient and the procedure verified as C-Section Delivery. A time out was held and the above information confirmed. After induction of anesthesia, the patient was draped and prepped in the usual sterile manner. A Pfannenstiel incision was made and carried down through the subcutaneous tissue to the fascia. Fascial incision was made and extended transversely with the Mayo scissors. The fascia was separated from the underlying rectus tissue superiorly and inferiorly. The peritoneum was identified and entered bluntly. Peritoneal incision was extended longitudinally. A low transverse hysterotomy was made. The fetus was delivered atraumatically. The umbilical cord was clamped x2 and cut and the infant was handed to the awaiting pediatricians. The placenta was removed intact and appeared normal with a 3-vessel cord. The uterus was exteriorized and cleared of all clot and debris. The hysterotomy was closed with running sutures of 0 Vicryl suture.  A second imbricating layer  was placed with the same suture. Excellent hemostasis was observed.   Desire for tubal ligation was again verbally confirmed with the patient. The right fallopian tube was followed from the cornua to the fimbria. The babcock clamp was used to grasp and elevate an avascular midsection of the tube approximately 3-4cm from the cornua. 0-chromic suture was used passed through the mesosalpinx and used to create a knuckle to fallopian tube. The tube was double ligated with 0- chromic suture and the intervening portion of tube was transected and removed with the Metzenbaum scissors. Excellent hemostasis was noted. Attention was then turned to the left fallopian tube after confirmation of identification by tracing the tube out to the fimbriae. The same procedure was then performed on the left fallopian tube. Again, excellent hemostasis was noted at the end of the procedure at both sites of the tubal ligation.   The uterus was returned to the abdomen. The pelvis was inspected and again, excellent hemostasis was noted. The peritoneum was closed with a running stitch of 2-0 Vicryl. The On Q Pain pump system was then placed.  Trocars were placed through the abdominal wall into the subfascial space and these were used to thread the silver soaker cathaters into place.The rectus muscles were inspected and were hemostatic. The rectus fascia was then reapproximated with running sutures of 0-vicryl, with careful placement not to incorporate the cathaters. Subcutaneous tissues are then irrigated with saline and hemostasis assured with the bovie. The subcutaneous fat was approximated with 3-0 plain and a running stitch.  The skin was closed with 4-0 monocryl suture in a subcuticular fashion followed by skin adhesive. The cathaters are flushed each with 5 mL of Bupivicaine and stabilized into place with dressing. Instrument, sponge, and needle  counts were correct prior to the abdominal closure and at the conclusion of the case.   The patient tolerated the procedure well and was transferred to the recovery room in stable condition.   [name] assisted with this case. This was a high level case requiring a Financial controller. No other assistant was readily available. She assisted with retraction and application of fundal pressure during the case.   Natale Milch MD [facility] 10/06/20 9:54 AM

## 2020-10-06 NOTE — Discharge Instructions (Signed)
Discharge Instructions:   Follow-up Appointment: 1 week, please call the office to schedule  If there are any new medications, they have been ordered and will be available for pickup at the listed pharmacy on your way home from the hospital.   Call the office if you have any of the following: headache, visual changes, fever >101.0 F, chills, shortness of breath, breast concerns, excessive vaginal bleeding, incision drainage or problems, leg pain or redness, depression or any other concerns. If you have vaginal discharge with an odor, let your doctor know.   It is normal to bleed for up to 6 weeks. You should not soak through more than 1 pad in 1 hour. If you have a blood clot larger than your fist with continued bleeding, call your doctor.   After a c-section, you should expect a small amount of blood or clear fluid coming from the incision and abdominal cramping/soreness. Inspect your incision site daily. Stand in front of a mirror to look for any redness, incision opening, or discolored/odorness drainage. Take a shower daily and continue good hygiene. Use own towel and washcloth (do not share). Make sure your sheets on your bed are clean. No pets sleeping around your incision site. Dressing will be removed at your postpartum visit. If the dressing does become wet or soiled underneath, it is okay to remove it before your visit.    On-Q pump: You will remove on day 4 after insertion or if the ball becomes flat before day 4. You will remove on: 10/10/2020  Activity: Do not lift > 15 lbs for 6 weeks (do not lift anything heavier than your baby). No intercourse, tampons, swimming pools, hot tubs, baths (only showers) for 6 weeks.  No driving for 1-2 weeks. Do not drive while taking narcotic or opioid pain medication.  Continue taking your prenatal vitamin, especially if breastfeeding. Increase calories and fluids (water) while breastfeeding.   Your milk will come in, in the next couple of days (right  now it is colostrum). You may have a slight fever when your milk comes in, but it should go away on its own.  If it does not, and rises above 101 F please call the doctor. You will also feel achy and your breasts will be firm. They will also start to leak. If you are breastfeeding, continue as you have been and you can pump/express milk for comfort.   If you have too much milk, your breasts can become engorged, which could lead to mastitis. This is an infection of the milk ducts. It can be very painful and you will need to notify your doctor to obtain a prescription for antibiotics. You can also treat it with a shower or hot/cold compress.   For concerns about your baby, please call your pediatrician.  For breastfeeding concerns, the lactation consultant can be reached at [phone].   Postpartum blues (feelings of happy one minute and sad another minute) are normal for the first few weeks but if it gets worse let your doctor know.   Congratulations! We enjoyed caring for you and your new bundle of joy!

## 2020-10-06 NOTE — Transfer of Care (Signed)
Immediate Anesthesia Transfer of Care Note  Patient: Ashley Howell  Procedure(s) Performed: CESAREAN SECTION WITH BILATERAL TUBAL LIGATION (Bilateral)  Patient Location: PACU  Anesthesia Type:Spinal  Level of Consciousness: awake, alert  and oriented  Airway & Oxygen Therapy: Patient Spontanous Breathing  Post-op Assessment: Report given to RN and Post -op Vital signs reviewed and stable  Post vital signs: Reviewed and stable  Last Vitals:  Vitals Value Taken Time  BP 139/86 10/06/20 1030  Temp 36.8 C 10/06/20 1000  Pulse 80 10/06/20 1043  Resp 17 10/06/20 1043  SpO2 97 % 10/06/20 1043  Vitals shown include unvalidated device data.  Last Pain:  Vitals:   10/06/20 1030  TempSrc:   PainSc: 7          Complications: No notable events documented.

## 2020-10-06 NOTE — H&P (Signed)
Ashley Howell is an 30 y.o. female.   Chief Complaint: History of prior cesarean section, IUGR  HPI: She is feeling well. She is here today for scheduled cesarean section.   pregnancy6 Problems (from 01/19/20 to present)     Problem Noted Resolved   Pregnancy affected by fetal growth restriction 07/13/2020 by Conard Novak, MD No   Overview Signed 07/13/2020  4:41 PM by Conard Novak, MD    - followed by MFM       Supervision of high-risk pregnancy, third trimester 03/16/2020 by Zipporah Plants, CNM No   Overview Addendum 08/24/2020  5:28 PM by Natale Milch, MD     Nursing Staff Provider  Office Location  [facility] Dating   LMP = 10w [place]  Language  English Anatomy US   with MFM - IUGR  Flu Vaccine   UTD (through employer) Genetic Screen  NIPS:   Negx3, XY  TDaP vaccine   08/10/2020 Hgb A1C or  GTT Early : 100 Third trimester : 79  Rhogam   n/a   LAB RESULTS   Feeding Plan  Blood Type O/Positive/-- (12/08 1432)   Contraception  BTL Antibody Negative (12/08 1432)  Circumcision  Rubella <0.90 (12/08 1432)  Pediatrician   RPR Non Reactive (12/08 1432)   Support Person  Charles HBsAg Negative (12/08 1432)   Prenatal Classes Discussed HIV Non Reactive (12/08 1432)  Covid vaccine  completed x2 Varicella  immune  BTL Consent  desires [x ] consents 06/23/2020 GBS  (For PCN allergy, check sensitivities)        VBAC Consent  n/a -repeat cesarean Pap  NIL 2021    Hgb Electro      CF  negative     SMA  at risk - genetic counseling referral placed       High Risk Pregnancy Diagnoses  Hx of ASD/PFO- small on bubble study Hx of cesarean delivery x4 Maternal obesity - pregravid BMI >40 cHTN - nifedipine 90 mg daily (increased from 30 to 90 by [facility] 08/17/2020) Hx of cardiomegaly  IUGR - noted at 19 weeks- following with MFM - see recs Tobacco use in pregnancy- quit 06/02/2020       Chronic hypertension 05/19/2018 by Jimmey Ralph, MD No   Overview Addendum  04/27/2020 10:46 AM by Zipporah Plants, CNM    [x]  Aspirin 81 mg daily after 12 weeks; discontinue after 36 weeks [x]  baseline labs with CBC, CMP, urine protein/creatinine ratio [ ]  no BP meds unless BPs become elevated - controlled with amlodipine 5 mg [ ]  ultrasound for growth at 28, 32, 36 weeks [x]  Baseline EKG - cards referral 12/8  Current antihypertensives:  amlodipine 5 mg Baseline and surveillance labs (pulled in from [facility], refresh links as needed)  Lab Results  Component Value Date   PLT 365 09/27/2019   CREATININE 0.70 05/19/2019   AST 21 05/19/2019   ALT 26 05/19/2019   PROTCRRATIO 0.14 10/09/2018   Antenatal Testing CHTN - O10.919  Group I  BP < 140/90, no preeclampsia, AGA,  nml AFV, +/- meds    Group II BP > 140/90, on meds, no preeclampsia, AGA, nml AFV  20-28-34-38  20-24-28-32-35-38  32//2 x wk  28//BPP wkly then 32//2 x wk  40 no meds; 39 meds  PRN or 37  Pre-eclampsia  GHTN - O13.9/Preeclampsia without severe features  - O14.00   Preeclampsia with severe features - O14.10  Q 3-4wks  Q 2 wks  28//BPP wkly  then 32//2 x wk  Inpatient  37  PRN or 34         Obesity in pregnancy, antepartum 02/15/2015 by Farrel Conners, CNM No   Overview Addendum 07/13/2020  4:54 PM by Conard Novak, MD    BMI >=40 [x]  early 1h gtt - ordered  [x]  u/s for dating [ ]   [x]  nutritional goals [x ] folic acid 1mg  [x]  bASA (>12 weeks) [x]  anesthesia consult  [ ]  repeat anesthesia consult @ 34 wks            Past Medical History:  Diagnosis Date   Anxiety    ASD (atrial septal defect)    Benign essential hypertension, antepartum 05/19/2018   160/99 at anatomy scan 18 weeks   Pt sent to ER on 05/15/2018 resolved with rest   Elevated at u/s visit - labetalol 100 mg bid initiated   Hx of cardiomegaly    Hypertension    PFO (patent foramen ovale)    a. 12/2014 Echo: EF 55-60%, mildly dil LA. PFO w/o significant shunting (reviewed by Dr. 07/2018).    Shortness of breath dyspnea    Spinal headache     Past Surgical History:  Procedure Laterality Date   CESAREAN SECTION  10/28/2008   CESAREAN SECTION N/A 02/22/2015   Procedure: CESAREAN SECTION;  Surgeon: , MD;  Location: [facility] ORS;  Service: Obstetrics;  Laterality: N/A;   CESAREAN SECTION  04/13/2010   CESAREAN SECTION N/A 10/09/2018   Procedure: CESAREAN SECTION;  Surgeon: 01/2015, MD;  Location: [facility] ORS;  Service: Obstetrics;  Laterality: N/A;    Family History  Problem Relation Age of Onset   Hypertension Mother    Stroke Paternal Aunt    Cancer Maternal Grandfather        Colon   Social History:  reports that she has been smoking cigarettes. She has a 0.50 pack-year smoking history. She has never used smokeless tobacco. She reports that she does not drink alcohol and does not use drugs.  Allergies: No Known Allergies  Medications Prior to Admission  Medication Sig Dispense Refill   aspirin 81 MG EC tablet Chew 81 mg by mouth daily.     NIFEdipine (PROCARDIA-XL/NIFEDICAL-XL) 30 MG 24 hr tablet Take 3 tablets (90 mg total) by mouth daily. 30 tablet 3   Prenat w/o A-FeCbGl-DSS-FA-DHA (CITRANATAL ASSURE) 35-1 & 300 MG tablet Take 1 tablet by mouth daily. 60 each 3    Results for orders placed or performed during the hospital encounter of 10/04/20 (from the past 48 hour(s))  SARS CORONAVIRUS 2 (TAT 6-24 HRS) Nasopharyngeal Nasopharyngeal Swab     Status: None   Collection Time: 10/04/20  2:36 PM   Specimen: Nasopharyngeal Swab  Result Value Ref Range   SARS Coronavirus 2 NEGATIVE NEGATIVE    Comment: (NOTE) SARS-CoV-2 target nucleic acids are NOT DETECTED.  The SARS-CoV-2 RNA is generally detectable in upper and lower respiratory specimens during the acute phase of infection. Negative results do not preclude SARS-CoV-2 infection, do not rule out co-infections with other pathogens, and should not be used as the sole basis for treatment or  other patient management decisions. Negative results must be combined with clinical observations, patient history, and epidemiological information. The expected result is Negative.  Fact Sheet for Patients: 10/30/2008  Fact Sheet for Healthcare Providers: 02/24/2015  This test is not yet approved or cleared by the [place] FDA and  has been authorized for detection and/or diagnosis  of SARS-CoV-2 by FDA under an Emergency Use Authorization (EUA). This EUA will remain  in effect (meaning this test can be used) for the duration of the COVID-19 declaration under Se ction 564(b)(1) of the Act, 21 U.S.C. section 360bbb-3(b)(1), unless the authorization is terminated or revoked sooner.  Performed at [facility], [address] [place], [place] [address]   CBC     Status: Abnormal   Collection Time: 10/04/20  2:48 PM  Result Value Ref Range   WBC 13.2 (H) 4.0 - 10.5 K/uL   RBC 4.34 3.87 - 5.11 MIL/uL   Hemoglobin 11.7 (L) 12.0 - 15.0 g/dL   HCT 60.435.0 (L) 54.036.0 - 98.146.0 %   MCV 80.6 80.0 - 100.0 fL   MCH 27.0 26.0 - 34.0 pg   MCHC 33.4 30.0 - 36.0 g/dL   RDW 19.113.5 47.811.5 - 29.515.5 %   Platelets 347 150 - 400 K/uL   nRBC 0.0 0.0 - 0.2 %    Comment: Performed at [facility], [address] [place], [place] [address]  RPR     Status: None   Collection Time: 10/04/20  2:48 PM  Result Value Ref Range   RPR Ser Ql NON REACTIVE NON REACTIVE    Comment: Performed at [facility], [address] [place], [place] [address]  Rapid HIV screen (HIV 1/2 Ab+Ag)     Status: None   Collection Time: 10/04/20  2:48 PM  Result Value Ref Range   HIV-1 P24 Antigen - HIV24 NON REACTIVE NON REACTIVE    Comment: (NOTE) Detection of p24 may be inhibited by biotin in the sample, causing false negative results in acute infection.    HIV 1/2 Antibodies NON REACTIVE NON REACTIVE   Interpretation (HIV Ag Ab)      A non  reactive test result means that HIV 1 or HIV 2 antibodies and HIV 1 p24 antigen were not detected in the specimen.    Comment: Performed at [facility], [address] [place], [place] [address]  Type and screen [facility]     Status: None   Collection Time: 10/04/20  2:48 PM  Result Value Ref Range   ABO/RH(D) O POS    Antibody Screen NEG    Sample Expiration 10/07/2020,2359    Extend sample reason      PREGNANT WITHIN 3 MONTHS, UNABLE TO EXTEND Performed at [facility], [address] [place], [place] [address]   Basic metabolic panel     Status: Abnormal   Collection Time: 10/04/20  2:57 PM  Result Value Ref Range   Sodium 135 135 - 145 mmol/L   Potassium 3.2 (L) 3.5 - 5.1 mmol/L   Chloride 106 98 - 111 mmol/L   CO2 21 (L) 22 - 32 mmol/L   Glucose, Bld 87 70 - 99 mg/dL    Comment: Glucose reference range applies only to samples taken after fasting for at least 8 hours.   BUN 6 6 - 20 mg/dL   Creatinine, Ser 1.320.54 0.44 - 1.00 mg/dL   Calcium 9.0 8.9 - 44.010.3 mg/dL   GFR, Estimated >10>60 >27>60 mL/min    Comment: (NOTE) Calculated using the CKD-EPI Creatinine Equation (2021)    Anion gap 8 5 - 15    Comment: Performed at [facility], [address] [place], [place] [address]   No results found.  Review of Systems  Constitutional:  Negative for chills and fever.  HENT:  Negative for congestion, hearing loss and sinus pain.   Respiratory:  Negative for cough, shortness of breath and wheezing.   Cardiovascular:  Negative for chest pain, palpitations and leg swelling.  Gastrointestinal:  Negative for abdominal pain, constipation, diarrhea, nausea and vomiting.  Genitourinary:  Negative for dysuria, flank pain, frequency, hematuria and urgency.  Musculoskeletal:  Negative for back pain.  Skin:  Negative for rash.  Neurological:  Negative for dizziness and headaches.  Psychiatric/Behavioral:  Negative for suicidal ideas. The  patient is not nervous/anxious.    Blood pressure 134/79, pulse (!) 118, temperature 98.9 F (37.2 C), temperature source Oral, resp. rate 16, height 5\' 7"  (1.702 m), weight 134 kg, last menstrual period 01/19/2020, not currently breastfeeding. Physical Exam Vitals and nursing note reviewed.  Constitutional:      Appearance: Normal appearance. She is well-developed.  HENT:     Head: Normocephalic and atraumatic.  Cardiovascular:     Rate and Rhythm: Normal rate and regular rhythm.  Pulmonary:     Effort: Pulmonary effort is normal.     Breath sounds: Normal breath sounds.  Abdominal:     General: Bowel sounds are normal.     Palpations: Abdomen is soft.  Musculoskeletal:        General: Normal range of motion.  Skin:    General: Skin is warm and dry.  Neurological:     Mental Status: She is alert and oriented to person, place, and time.  Psychiatric:        Behavior: Behavior normal.        Thought Content: Thought content normal.        Judgment: Judgment normal.     Assessment/Plan  30 y.o. 37 at [redacted]w[redacted]d with history of prior cesarean section who desires an elective repeat cesarean section and sterilization. Delivery indicated for IUGR.   Discussed risks, benefits, and alternatives with the patient for the cesarean section.  Discussed the risk of infection bleeding and damage to surrounding pelvic tissues including but not limited to the uterus, fallopian tubes, ovaries, bowel, and bladder.  Discussed proper care for the incision postoperatively to avoid infection and signs and symptoms of infection to watch for.  Discussed possibility of an emergent blood transfusion for heavy blood loss.  Discussed risks associated with blood transfusion including transfusion reaction and chronic infections, or sepsis which could lead to death.  Patient gave consent for the procedure and blood transfusion if needed. Consent forms were completed.   [redacted]w[redacted]d, MD 10/06/2020, 7:22  AM

## 2020-10-07 LAB — CBC
HCT: 32.4 % — ABNORMAL LOW (ref 36.0–46.0)
Hemoglobin: 10.9 g/dL — ABNORMAL LOW (ref 12.0–15.0)
MCH: 27.5 pg (ref 26.0–34.0)
MCHC: 33.6 g/dL (ref 30.0–36.0)
MCV: 81.8 fL (ref 80.0–100.0)
Platelets: 299 10*3/uL (ref 150–400)
RBC: 3.96 MIL/uL (ref 3.87–5.11)
RDW: 13.3 % (ref 11.5–15.5)
WBC: 10.1 10*3/uL (ref 4.0–10.5)
nRBC: 0 % (ref 0.0–0.2)

## 2020-10-07 LAB — SURGICAL PATHOLOGY

## 2020-10-07 MED ORDER — ACETAMINOPHEN 500 MG PO TABS
1000.0000 mg | ORAL_TABLET | Freq: Four times a day (QID) | ORAL | Status: DC | PRN
  Administered 2020-10-07 – 2020-10-08 (×3): 1000 mg via ORAL
  Filled 2020-10-07 (×3): qty 2

## 2020-10-07 NOTE — Progress Notes (Signed)
Obstetric Postpartum/PostOperative Daily Progress Note Subjective:  30 y.o. [id number] post-operative day # 1 status post repeat cesarean delivery with tubal ligation.  She is ambulating, is tolerating po, is voiding spontaneously.  Her pain is well controlled on PO pain medications. Her lochia is less than menses. She reports baby has latched well and she is also supplementing with formula.    Medications SCHEDULED MEDICATIONS   acetaminophen  1,000 mg Oral Q6H   ferrous sulfate  325 mg Oral BID WC   ibuprofen  600 mg Oral Q6H   NIFEdipine  90 mg Oral Daily   prenatal multivitamin  1 tablet Oral Q1200   scopolamine  1 patch Transdermal Once   senna-docusate  2 tablet Oral Q24H   simethicone  80 mg Oral TID PC    MEDICATION INFUSIONS   bupivacaine ON-Q pain pump     lactated ringers Stopped (10/06/20 2300)   naLOXone (NARCAN) adult infusion for PRURITIS      PRN MEDICATIONS  bisacodyl, coconut oil, witch hazel-glycerin **AND** dibucaine, diphenhydrAMINE **OR** diphenhydrAMINE, diphenhydrAMINE, HYDROmorphone (DILAUDID) injection, measles, mumps & rubella vaccine, menthol-cetylpyridinium, nalbuphine **OR** nalbuphine, nalbuphine **OR** nalbuphine, naloxone **AND** sodium chloride flush, naLOXone (NARCAN) adult infusion for PRURITIS, ondansetron (ZOFRAN) IV, oxyCODONE, simethicone, sodium phosphate, zolpidem    Objective:   Vitals:   10/07/20 0300 10/07/20 0333 10/07/20 0700 10/07/20 0802  BP:  117/81  119/77  Pulse: 87 80 94 89  Resp:  18  16  Temp:  98.5 F (36.9 C)  98 F (36.7 C)  TempSrc:  Oral  Oral  SpO2: 92% 95% (!) 85% 100%  Weight:      Height:        Current Vital Signs 24h Vital Sign Ranges  T 98 F (36.7 C) Temp  Avg: 98.2 F (36.8 C)  Min: 97.9 F (36.6 C)  Max: 98.5 F (36.9 C)  BP 119/77 BP  Min: 106/58  Max: 139/81  HR 89 Pulse  Avg: 85.5  Min: 76  Max: 109  RR 16 Resp  Avg: 18.8  Min: 0  Max: 28  SaO2 100 % Room Air SpO2  Avg: 96.3 %  Min: 85 %  Max: 100 %        24 Hour I/O Current Shift I/O  Time Ins Outs 06/30 0701 - 07/01 0700 In: 2046.5 [P.O.:500; I.V.:1436.5] Out: 1015 [Urine:560] No intake/output data recorded.   General: NAD Pulmonary: no increased work of breathing Abdomen: non-distended, non-tender, fundus firm at level of umbilicus Inc: Clean/dry/intact, On Q intact Extremities: no edema, no erythema, no tenderness  Labs:  Recent Labs  Lab 10/04/20 1448 10/07/20 0718  WBC 13.2* 10.1  HGB 11.7* 10.9*  HCT 35.0* 32.4*  PLT 347 299     Assessment:   30 y.o. [id number] postoperative day # 1 status post repeat cesarean section/bilateral tubal ligation, lactating  Plan:  1) Acute blood loss anemia - hemodynamically stable and asymptomatic - po ferrous sulfate  2) CHTN: 90 mg Procardia XL daily  3) O POS / Rubella <0.90 (12/08 1432)/ Varicella Immune  4) TDAP status given antepartum  5) Breast and Formula feeding/Contraception = bilateral tubal ligation  6) Disposition: continue current care   Ashley Howell, CNM 10/07/2020 9:29 AM

## 2020-10-08 MED ORDER — OXYCODONE HCL 5 MG PO TABS: 5.0000 mg | ORAL_TABLET | Freq: Four times a day (QID) | ORAL | 0 refills | Status: AC | PRN

## 2020-10-08 MED ORDER — FERROUS SULFATE 325 (65 FE) MG PO TABS: 325.0000 mg | ORAL_TABLET | Freq: Two times a day (BID) | ORAL | 3 refills | Status: DC

## 2020-10-08 MED ORDER — ACETAMINOPHEN 500 MG PO TABS: 1000.0000 mg | ORAL_TABLET | Freq: Four times a day (QID) | ORAL | 0 refills | Status: DC | PRN

## 2020-10-08 MED ORDER — IBUPROFEN 600 MG PO TABS: 600.0000 mg | ORAL_TABLET | Freq: Four times a day (QID) | ORAL | 0 refills | Status: DC

## 2020-10-08 NOTE — Discharge Summary (Signed)
Postpartum Discharge Summary    Patient Name: Ashley Howell DOB: May 30, 1990 MRN: [id number]  Date of admission: 10/06/2020 Delivery date:10/06/2020  Delivering provider: Adrian Prows R  Date of discharge: 10/08/2020  Admitting diagnosis: History of cesarean section [Z98.891] Intrauterine pregnancy: [redacted]w[redacted]d    Secondary diagnosis:  Active Problems:   Obesity in pregnancy, antepartum   Chronic hypertension   Supervision of high-risk pregnancy, third trimester   Pregnancy affected by fetal growth restriction   History of cesarean section   Encounter for sterilization   [redacted] weeks gestation of pregnancy  Additional problems: none    Discharge diagnosis: Term Pregnancy Delivered and CHTN                                              Post partum procedures: none Augmentation: N/A Complications: None  Hospital course: Sceduled C/S   30y.o. yo [id number] 330w2das admitted to the hospital 10/06/2020 for scheduled cesarean section with the following indication:Elective Repeat.Delivery details are as follows:  Membrane Rupture Time/Date: 8:34 AM ,10/06/2020   Delivery Method:C-Section, Low Transverse  Details of operation can be found in separate operative note.  Patient had an uncomplicated postpartum course.  She is ambulating, tolerating a regular diet, passing flatus, and urinating well. Patient is discharged home in stable condition on  10/08/20        Newborn Data: Birth date:10/06/2020  Birth time:8:35 AM  Gender:Female  Living status:Living  Apgars:9 ,10  Weight:2240 g     Magnesium Sulfate received: No BMZ received: Yes Rhophylac:N/A MMR:Yes T-DaP:Given prenatally on 08/10/2020 Flu: yes 03/09/2020 Transfusion:No  Physical exam  Vitals:   10/07/20 2000 10/07/20 2344 10/08/20 0808 10/08/20 1201  BP: 117/68 123/70 130/74 133/79  Pulse:  97 93 92  Resp: _0 Temp: 98.7 F (37.1 C) 98.7 F (37.1 C) 98.7 F (37.1 C) 98.7 F (37.1 C)  TempSrc: Oral Oral  Oral Oral  SpO2: 100%  100%   Weight:      Height:       General: alert, cooperative, and no distress Lochia: appropriate Uterine Fundus: firm Incision: Healing well with no significant drainage, Dressing is clean, dry, and intact, Prevena vacuum in place DVT Evaluation: No evidence of DVT seen on physical exam. No significant calf/ankle edema. Labs: Lab Results  Component Value Date   WBC 10.1 10/07/2020   HGB 10.9 (L) 10/07/2020   HCT 32.4 (L) 10/07/2020   MCV 81.8 10/07/2020   PLT 299 10/07/2020   CMP Latest Ref Rng & Units 10/04/2020  Glucose 70 - 99 mg/dL 87  BUN 6 - 20 mg/dL 6  Creatinine 0.44 - 1.00 mg/dL 0.54  Sodium 135 - 145 mmol/L 135  Potassium 3.5 - 5.1 mmol/L 3.2(L)  Chloride 98 - 111 mmol/L 106  CO2 22 - 32 mmol/L 21(L)  Calcium 8.9 - 10.3 mg/dL 9.0  Total Protein 6.0 - 8.5 g/dL -  Total Bilirubin 0.0 - 1.2 mg/dL -  Alkaline Phos 44 - 121 IU/L -  AST 0 - 40 IU/L -  ALT 0 - 32 IU/L -   Edinburgh Score: Edinburgh Postnatal Depression Scale Screening Tool 10/07/2020  I have been able to laugh and see the funny side of things. 0  I have looked forward with enjoyment to things. 0  I have blamed myself unnecessarily when  things went wrong. 1  I have been anxious or worried for no good reason. 0  I have felt scared or panicky for no good reason. 1  Things have been getting on top of me. 1  I have been so unhappy that I have had difficulty sleeping. 0  I have felt sad or miserable. 0  I have been so unhappy that I have been crying. 1  The thought of harming myself has occurred to me. 0  Edinburgh Postnatal Depression Scale Total 4      After visit meds:  Allergies as of 10/08/2020   No Known Allergies      Medication List     TAKE these medications    acetaminophen 500 MG tablet Commonly known as: TYLENOL Take 2 tablets (1,000 mg total) by mouth every 6 (six) hours as needed for moderate pain.   aspirin 81 MG EC tablet Chew 81 mg by mouth daily.    CitraNatal Assure 35-1 & 300 MG tablet Take 1 tablet by mouth daily.   ferrous sulfate 325 (65 FE) MG tablet Take 1 tablet (325 mg total) by mouth 2 (two) times daily with a meal.   ibuprofen 600 MG tablet Commonly known as: ADVIL Take 1 tablet (600 mg total) by mouth every 6 (six) hours.   NIFEdipine 30 MG 24 hr tablet Commonly known as: PROCARDIA-XL/NIFEDICAL-XL Take 3 tablets (90 mg total) by mouth daily.   oxyCODONE 5 MG immediate release tablet Commonly known as: Oxy IR/ROXICODONE Take 1 tablet (5 mg total) by mouth every 6 (six) hours as needed for up to 7 days for moderate pain.               Discharge Care Instructions  (From admission, onward)           Start     Ordered   10/08/20 0000  Discharge wound care:       Comments: Perform wound care instructions   10/08/20 1203             Discharge home in stable condition Infant Feeding:  formula and breast milk Infant Disposition:home with mother Discharge instruction: per After Visit Summary and Postpartum booklet. Activity: Advance as tolerated. Pelvic rest for 6 weeks.  Diet: routine diet Anticipated Birth Control: BTL done during c-section Postpartum Appointment:1 week Additional Postpartum F/U: Incision check 1 week Future Appointments:No future appointments. Follow up Visit:  Follow-up Information     Schuman, Stefanie Libel, MD. Schedule an appointment as soon as possible for a visit in 1 week(s).   Specialty: Obstetrics and Gynecology Contact information: [address] [place] [address] [phone]                 SIGNED:  Prentice Docker, MD, [facility], [facility] 10/08/2020 12:07 PM

## 2020-10-08 NOTE — Plan of Care (Signed)
Patient Appropriate for discharge  ?

## 2020-10-12 ENCOUNTER — Ambulatory Visit: Payer: Medicaid Other | Admitting: Obstetrics and Gynecology

## 2020-10-13 ENCOUNTER — Other Ambulatory Visit: Payer: Self-pay

## 2020-10-13 ENCOUNTER — Ambulatory Visit (INDEPENDENT_AMBULATORY_CARE_PROVIDER_SITE_OTHER): Payer: Medicaid Other | Admitting: Obstetrics and Gynecology

## 2020-10-13 ENCOUNTER — Encounter: Payer: Self-pay | Admitting: Obstetrics and Gynecology

## 2020-10-13 MED ORDER — FLUCONAZOLE 150 MG PO TABS: 150.0000 mg | ORAL_TABLET | Freq: Once | ORAL | 0 refills | Status: AC

## 2020-10-13 NOTE — Progress Notes (Signed)
   Postoperative Follow-up Patient presents post op from cesarean section  8days ago.  Subjective: She denies fever, chills, nausea, and vomiting. Eating a regular diet without difficulty. Pain control is adequate  Activity: increasing slowly. She denies issues with her incision.    Objective: BP (!) 139/92   Ht 5\' 8"  (1.727 m)   Wt 291 lb 12.8 oz (132.4 kg)   LMP 01/19/2020   BMI 44.37 kg/m   Physical Exam Constitutional:      General: She is not in acute distress.    Appearance: Normal appearance.  HENT:     Head: Normocephalic and atraumatic.  Eyes:     General: No scleral icterus.    Conjunctiva/sclera: Conjunctivae normal.  Abdominal:     General: There is no distension.     Palpations: Abdomen is soft. There is mass (uterine fundus at U-3).     Tenderness: There is no abdominal tenderness. There is no guarding or rebound.     Comments: Vacuum removed after powering machine off, disconnecting tubing.   Incision: without erythema, induration, warmth, and tenderness. It is clean, dry, and intact.   Benzoin and several steri-strips applied.   Neurological:     General: No focal deficit present.     Mental Status: She is alert and oriented to person, place, and time.     Cranial Nerves: No cranial nerve deficit.  Psychiatric:        Mood and Affect: Mood normal.        Behavior: Behavior normal.        Judgment: Judgment normal.    Assessment: 30 y.o. s/p cesarean section progressing well  Plan: Patient has done well after surgery with no apparent complications.  I have discussed the post-operative course to date, and the expected progress moving forward.  The patient understands what complications to be concerned about.    Activity plan: increase slowly  Return in about 5 weeks (around 11/17/2020) for Six Week Postpartum wtih Dr 01/17/2021.  Jerene Pitch, MD 10/13/2020 11:36 AM

## 2020-10-24 ENCOUNTER — Telehealth: Payer: Self-pay

## 2020-10-24 ENCOUNTER — Other Ambulatory Visit: Payer: Self-pay | Admitting: Obstetrics and Gynecology

## 2020-10-24 DIAGNOSIS — B373 Candidiasis of vulva and vagina: Secondary | ICD-10-CM

## 2020-10-24 DIAGNOSIS — B3731 Acute candidiasis of vulva and vagina: Secondary | ICD-10-CM

## 2020-10-24 MED ORDER — FLUCONAZOLE 150 MG PO TABS: 150.0000 mg | ORAL_TABLET | ORAL | 0 refills | Status: AC

## 2020-10-24 NOTE — Telephone Encounter (Signed)
Called patient. Discussed bleeding  precautions and encouraged to come to the hospital if her bleeding is heavy or she feels faint/ fatigued. She reports on and off bleeding and passage of two large clots today. Also having itching suggestive of yeast. Rx for diflucan sent. Offered double book appointment, but she prefers to wait until the 15th at this time.

## 2020-10-24 NOTE — Telephone Encounter (Signed)
Pt calling; is passing clots; thinks she has a yeast inf; back cramps.  [phone]  Pt is 2wks pp; states she has only passed one large clot; adv normal; if passed one large clot after another needs to be seen.  My [place] monistat 3d or 7d for yeast inf; as for back cramps may take 600mg  IBU q6hrs or 800mg  q8hrs.

## 2020-11-21 ENCOUNTER — Encounter: Payer: Self-pay | Admitting: Obstetrics and Gynecology

## 2020-11-21 ENCOUNTER — Other Ambulatory Visit: Payer: Self-pay

## 2020-11-21 ENCOUNTER — Ambulatory Visit (INDEPENDENT_AMBULATORY_CARE_PROVIDER_SITE_OTHER): Payer: Medicaid Other | Admitting: Obstetrics and Gynecology

## 2020-11-21 VITALS — BP 138/74 | Ht 67.0 in | Wt 282.6 lb

## 2020-11-21 DIAGNOSIS — Z113 Encounter for screening for infections with a predominantly sexual mode of transmission: Secondary | ICD-10-CM

## 2020-11-21 NOTE — Progress Notes (Signed)
Postpartum Visit  Chief Complaint:  Chief Complaint  Patient presents with   Postpartum Care    History of Present Illness: Patient is a 30 y.o. [id number] presents for postpartum visit.  Date of delivery: 10/06/2020 Type of delivery: cesarean  Pregnancy or labor problems: IUGR, CHTN  Breast Feeding:  yes Lochia: none Post partum depression/anxiety noted:  discussed, patient is managing symptoms with self care Edinburgh Post-Partum Depression Score: 9  Date of last PAP: 2021  normal   Any problems since the delivery:  none  She reports she is feeling well  Newborn Details:  SINGLETON :  1. Baby Gender:female.  Infant Status: Infant doing well at home with mother.   Review of Systems: Review of Systems  Constitutional:  Negative for chills, fever, malaise/fatigue and weight loss.  HENT:  Negative for congestion, hearing loss and sinus pain.   Eyes:  Negative for blurred vision and double vision.  Respiratory:  Negative for cough, sputum production, shortness of breath and wheezing.   Cardiovascular:  Negative for chest pain, palpitations, orthopnea and leg swelling.  Gastrointestinal:  Negative for abdominal pain, constipation, diarrhea, nausea and vomiting.  Genitourinary:  Negative for dysuria, flank pain, frequency, hematuria and urgency.  Musculoskeletal:  Negative for back pain, falls and joint pain.  Skin:  Negative for itching and rash.  Neurological:  Negative for dizziness and headaches.  Psychiatric/Behavioral:  Negative for depression, substance abuse and suicidal ideas. The patient is not nervous/anxious.    Past Medical History:  Past Medical History:  Diagnosis Date   Anxiety    ASD (atrial septal defect)    Benign essential hypertension, antepartum 05/19/2018   160/99 at anatomy scan 18 weeks   Pt sent to ER on 05/15/2018 resolved with rest   Elevated at u/s visit - labetalol 100 mg bid initiated   Hx of cardiomegaly    Hypertension    PFO (patent foramen  ovale)    a. 12/2014 Echo: EF 55-60%, mildly dil LA. PFO w/o significant shunting (reviewed by Dr. Mariah Milling 07/2018).   Shortness of breath dyspnea    Spinal headache     Past Surgical History:  Past Surgical History:  Procedure Laterality Date   CESAREAN SECTION  10/28/2008   CESAREAN SECTION N/A 02/22/2015   Procedure: CESAREAN SECTION;  Surgeon: Nadara Mustard, MD;  Location: [facility] ORS;  Service: Obstetrics;  Laterality: N/A;   CESAREAN SECTION  04/13/2010   CESAREAN SECTION N/A 10/09/2018   Procedure: CESAREAN SECTION;  Surgeon: Natale Milch, MD;  Location: [facility] ORS;  Service: Obstetrics;  Laterality: N/A;   CESAREAN SECTION WITH BILATERAL TUBAL LIGATION Bilateral 10/06/2020   Procedure: CESAREAN SECTION WITH BILATERAL TUBAL LIGATION;  Surgeon: Natale Milch, MD;  Location: [facility] ORS;  Service: Obstetrics;  Laterality: Bilateral;    Family History:  Family History  Problem Relation Age of Onset   Hypertension Mother    Stroke Paternal Aunt    Cancer Maternal Grandfather        Colon    Social History:  Social History   Socioeconomic History   Marital status: Single    Spouse name: Not on file   Number of children: 4   Years of education: Not on file   Highest education level: Not on file  Occupational History   Occupation: CNA  Tobacco Use   Smoking status: Some Days    Packs/day: 0.25    Years: 2.00    Pack years: 0.50    Types:  Cigarettes   Smokeless tobacco: Never  Vaping Use   Vaping Use: Never used  Substance and Sexual Activity   Alcohol use: No   Drug use: No   Sexual activity: Yes    Birth control/protection: None  Other Topics Concern   Not on file  Social History Narrative   Not on file   Social Determinants of Health   Financial Resource Strain: Not on file  Food Insecurity: Not on file  Transportation Needs: Not on file  Physical Activity: Not on file  Stress: Not on file  Social Connections: Not on file  Intimate Partner  Violence: Not on file    Allergies:  No Known Allergies  Medications: Prior to Admission medications   Medication Sig Start Date End Date Taking? Authorizing Provider  acetaminophen (TYLENOL) 500 MG tablet Take 2 tablets (1,000 mg total) by mouth every 6 (six) hours as needed for moderate pain. 10/08/20  Yes Conard Novak, MD  aspirin 81 MG EC tablet Chew 81 mg by mouth daily.   Yes [provider]  ferrous sulfate 325 (65 FE) MG tablet Take 1 tablet (325 mg total) by mouth 2 (two) times daily with a meal. 10/08/20  Yes Conard Novak, MD  ibuprofen (ADVIL) 600 MG tablet Take 1 tablet (600 mg total) by mouth every 6 (six) hours. 10/08/20  Yes Conard Novak, MD  NIFEdipine (PROCARDIA-XL/NIFEDICAL-XL) 30 MG 24 hr tablet Take 3 tablets (90 mg total) by mouth daily. 08/24/20  Yes Joshua Zeringue, Jaquelyn Bitter, MD  Prenat w/o A-FeCbGl-DSS-FA-DHA (CITRANATAL ASSURE) 35-1 & 300 MG tablet Take 1 tablet by mouth daily. Patient not taking: Reported on 11/21/2020 03/16/20   Zipporah Plants, CNM    Physical Exam Vitals:  Vitals:   11/21/20 1017  BP: 138/74    Physical Exam Constitutional:      Appearance: Normal appearance. She is well-developed.  Genitourinary:     Vulva normal.  HENT:     Head: Normocephalic and atraumatic.  Eyes:     Extraocular Movements: Extraocular movements intact.     Pupils: Pupils are equal, round, and reactive to light.  Neck:     Thyroid: No thyromegaly.  Cardiovascular:     Rate and Rhythm: Normal rate and regular rhythm.     Heart sounds: Normal heart sounds.  Pulmonary:     Effort: Pulmonary effort is normal.     Breath sounds: Normal breath sounds.  Abdominal:     General: Bowel sounds are normal. There is no distension.     Palpations: Abdomen is soft. There is no mass.     Comments: Incision is well healed  Musculoskeletal:     Cervical back: Neck supple.  Neurological:     Mental Status: She is alert and oriented to person, place, and  time.  Skin:    General: Skin is warm and dry.  Psychiatric:        Behavior: Behavior normal.        Thought Content: Thought content normal.        Judgment: Judgment normal.  Vitals reviewed.    Assessment: 30 y.o. [id number] presenting for 6 week postpartum visit  Plan: Problem List Items Addressed This Visit   None Visit Diagnoses     Screen for STD (sexually transmitted disease)    -  Primary   Relevant Orders   NuSwab Vaginitis Plus (VG+)   HEP, RPR, HIV Panel       1) Contraception-  tubal ligation  2)  Pap: up to date  3) Patient underwent screening for postpartum depression with no concerns noted.  4) Continue Procardia for BP control  5) STD testing today at patient request  - Follow up in 12 months for annual   Adelene Idler MD, [facility], [facility] 11/21/2020 10:51 AM

## 2020-11-22 LAB — HEP, RPR, HIV PANEL
HIV Screen 4th Generation wRfx: NONREACTIVE
Hepatitis B Surface Ag: NEGATIVE
RPR Ser Ql: NONREACTIVE

## 2020-11-24 ENCOUNTER — Other Ambulatory Visit: Payer: Self-pay | Admitting: Obstetrics and Gynecology

## 2020-11-24 DIAGNOSIS — B9689 Other specified bacterial agents as the cause of diseases classified elsewhere: Secondary | ICD-10-CM

## 2020-11-24 DIAGNOSIS — A5909 Other urogenital trichomoniasis: Secondary | ICD-10-CM

## 2020-11-24 DIAGNOSIS — N760 Acute vaginitis: Secondary | ICD-10-CM

## 2020-11-24 LAB — NUSWAB VAGINITIS PLUS (VG+)
Atopobium vaginae: HIGH Score — AB
BVAB 2: HIGH Score — AB
Candida albicans, NAA: NEGATIVE
Candida glabrata, NAA: NEGATIVE
Chlamydia trachomatis, NAA: NEGATIVE
Megasphaera 1: HIGH Score — AB
Neisseria gonorrhoeae, NAA: NEGATIVE
Trich vag by NAA: POSITIVE — AB

## 2020-11-24 MED ORDER — METRONIDAZOLE 500 MG PO TABS: ORAL_TABLET | ORAL | 0 refills | Status: DC

## 2020-11-24 MED ORDER — METRONIDAZOLE 0.75 % VA GEL: 1.0000 | Freq: Every day | VAGINAL | 1 refills | Status: DC

## 2020-11-24 NOTE — Telephone Encounter (Signed)
Called patient, sent prescriptons

## 2020-12-04 ENCOUNTER — Emergency Department
Admission: EM | Admit: 2020-12-04 | Discharge: 2020-12-04 | Disposition: A | Discharge status: Home / Self Care | Payer: Medicaid Other | Attending: Emergency Medicine | Admitting: Emergency Medicine

## 2020-12-04 ENCOUNTER — Emergency Department: Payer: Medicaid Other

## 2020-12-04 ENCOUNTER — Encounter: Payer: Self-pay | Admitting: Emergency Medicine

## 2020-12-04 ENCOUNTER — Other Ambulatory Visit: Payer: Self-pay

## 2020-12-04 DIAGNOSIS — M25532 Pain in left wrist: Secondary | ICD-10-CM | POA: Insufficient documentation

## 2020-12-04 DIAGNOSIS — I10 Essential (primary) hypertension: Secondary | ICD-10-CM | POA: Diagnosis not present

## 2020-12-04 DIAGNOSIS — Z7982 Long term (current) use of aspirin: Secondary | ICD-10-CM | POA: Diagnosis not present

## 2020-12-04 DIAGNOSIS — Z79899 Other long term (current) drug therapy: Secondary | ICD-10-CM | POA: Diagnosis not present

## 2020-12-04 DIAGNOSIS — F17210 Nicotine dependence, cigarettes, uncomplicated: Secondary | ICD-10-CM | POA: Insufficient documentation

## 2020-12-04 DIAGNOSIS — W010XXA Fall on same level from slipping, tripping and stumbling without subsequent striking against object, initial encounter: Secondary | ICD-10-CM | POA: Insufficient documentation

## 2020-12-04 DIAGNOSIS — S6992XA Unspecified injury of left wrist, hand and finger(s), initial encounter: Secondary | ICD-10-CM | POA: Insufficient documentation

## 2020-12-04 MED ORDER — HYDROCODONE-ACETAMINOPHEN 5-325 MG PO TABS: 1.0000 | ORAL_TABLET | ORAL | 0 refills | Status: DC | PRN

## 2020-12-04 MED ORDER — HYDROCODONE-ACETAMINOPHEN 5-325 MG PO TABS
1.0000 | ORAL_TABLET | Freq: Once | ORAL | Status: AC
  Administered 2020-12-04: 1 via ORAL
  Filled 2020-12-04: qty 1

## 2020-12-04 MED ORDER — MELOXICAM 15 MG PO TABS: 15.0000 mg | ORAL_TABLET | Freq: Every day | ORAL | 0 refills | Status: DC

## 2020-12-04 NOTE — ED Notes (Signed)
Pt NAD, a/ox4. Pt verbalizes understanding of all DC and f/u instructions. Pt demonstrates use of brace. All questions answered. Pt walks with steady gait to lobby at DC where pt states friend is waiting to pick her up.

## 2020-12-04 NOTE — ED Provider Notes (Signed)
[facility] Emergency Department Provider Note  ____________________________________________  Time seen: Approximately 6:21 PM  I have reviewed the triage vital signs and the nursing notes.   HISTORY  Chief Complaint Fall and Wrist Pain    HPI Ashley Howell is a 30 y.o. female who presents the emergency department complaining of a left forearm injury.  Patient states that she was drinking last night, she had tripped, fallen onto an outstretched hand.  She did not realize the extent of pain until this morning to her wrist.  She is complaining of pain and edema to the wrist.  No other injury or complaint at this time.  She did not hit her head or lose consciousness.  Patient has no history of previous fracture to the left wrist.       Past Medical History:  Diagnosis Date   Anxiety    ASD (atrial septal defect)    Benign essential hypertension, antepartum 05/19/2018   160/99 at anatomy scan 18 weeks   Pt sent to ER on 05/15/2018 resolved with rest   Elevated at u/s visit - labetalol 100 mg bid initiated   Hx of cardiomegaly    Hypertension    PFO (patent foramen ovale)    a. 12/2014 Echo: EF 55-60%, mildly dil LA. PFO w/o significant shunting (reviewed by Dr. Mariah Milling 07/2018).   Shortness of breath dyspnea    Spinal headache     Patient Active Problem List   Diagnosis Date Noted   History of cesarean section 10/06/2020   Encounter for sterilization    [redacted] weeks gestation of pregnancy    Poor fetal growth affecting management of mother in third trimester 09/08/2020   Gestational hypertension 08/23/2020   Pregnancy affected by fetal growth restriction 07/13/2020   Decreased fetal movement affecting management of pregnancy in second trimester 07/03/2020   Supervision of high-risk pregnancy, third trimester 03/16/2020   ASD (atrial septal defect) 03/16/2020   Bacterial vaginosis 06/25/2019   Dysplasia of cervix, low grade (CIN 1) 12/11/2018   Previous  cesarean delivery, antepartum 10/09/2018   BMI 40.0-44.9, adult (HCC) 09/04/2018   Chronic hypertension 05/19/2018   PFO (patent foramen ovale) 02/18/2015   Obesity in pregnancy, antepartum 02/15/2015   Previous cesarean delivery, antepartum condition or complication 02/08/2015   History of cardiomegaly 10/28/2014    Past Surgical History:  Procedure Laterality Date   CESAREAN SECTION  10/28/2008   CESAREAN SECTION N/A 02/22/2015   Procedure: CESAREAN SECTION;  Surgeon: Nadara Mustard, MD;  Location: [facility] ORS;  Service: Obstetrics;  Laterality: N/A;   CESAREAN SECTION  04/13/2010   CESAREAN SECTION N/A 10/09/2018   Procedure: CESAREAN SECTION;  Surgeon: Natale Milch, MD;  Location: [facility] ORS;  Service: Obstetrics;  Laterality: N/A;   CESAREAN SECTION WITH BILATERAL TUBAL LIGATION Bilateral 10/06/2020   Procedure: CESAREAN SECTION WITH BILATERAL TUBAL LIGATION;  Surgeon: Natale Milch, MD;  Location: [facility] ORS;  Service: Obstetrics;  Laterality: Bilateral;    Prior to Admission medications   Medication Sig Start Date End Date Taking? Authorizing Provider  HYDROcodone-acetaminophen (NORCO/VICODIN) 5-325 MG tablet Take 1 tablet by mouth every 4 (four) hours as needed for moderate pain. 12/04/20 12/04/21 Yes Leovanni Bjorkman, Delorise Royals, PA-C  meloxicam (MOBIC) 15 MG tablet Take 1 tablet (15 mg total) by mouth daily. 12/04/20  Yes Chelsy Parrales, Delorise Royals, PA-C  acetaminophen (TYLENOL) 500 MG tablet Take 2 tablets (1,000 mg total) by mouth every 6 (six) hours as needed for moderate pain. 10/08/20  Conard Novak, MD  aspirin 81 MG EC tablet Chew 81 mg by mouth daily.    [provider]  ferrous sulfate 325 (65 FE) MG tablet Take 1 tablet (325 mg total) by mouth 2 (two) times daily with a meal. 10/08/20   Conard Novak, MD  ibuprofen (ADVIL) 600 MG tablet Take 1 tablet (600 mg total) by mouth every 6 (six) hours. 10/08/20   Conard Novak, MD  metroNIDAZOLE (FLAGYL) 500 MG  tablet Take two tablets by mouth twice a day, for one day.  Or you can take all four tablets at once if you can tolerate it. 11/24/20   Schuman, Jaquelyn Bitter, MD  metroNIDAZOLE (METROGEL) 0.75 % vaginal gel Place 1 Applicatorful vaginally at bedtime. Apply one applicatorful to vagina at bedtime for 5 days 11/24/20   Natale Milch, MD  NIFEdipine (PROCARDIA-XL/NIFEDICAL-XL) 30 MG 24 hr tablet Take 3 tablets (90 mg total) by mouth daily. 08/24/20   Schuman, Jaquelyn Bitter, MD  Prenat w/o A-FeCbGl-DSS-FA-DHA (CITRANATAL ASSURE) 35-1 & 300 MG tablet Take 1 tablet by mouth daily. Patient not taking: Reported on 11/21/2020 03/16/20   Zipporah Plants, CNM    Allergies Patient has no known allergies.  Family History  Problem Relation Age of Onset   Hypertension Mother    Stroke Paternal Aunt    Cancer Maternal Grandfather        Colon    Social History Social History   Tobacco Use   Smoking status: Some Days    Packs/day: 0.25    Years: 2.00    Pack years: 0.50    Types: Cigarettes   Smokeless tobacco: Never  Vaping Use   Vaping Use: Never used  Substance Use Topics   Alcohol use: No   Drug use: No     Review of Systems  Constitutional: No fever/chills Eyes: No visual changes. No discharge ENT: No upper respiratory complaints. Cardiovascular: no chest pain. Respiratory: no cough. No SOB. Gastrointestinal: No abdominal pain.  No nausea, no vomiting.  No diarrhea.  No constipation Musculoskeletal: Positive for left wrist injury Skin: Negative for rash, abrasions, lacerations, ecchymosis. Neurological: Negative for headaches, focal weakness or numbness.  10 System ROS otherwise negative.  ____________________________________________   PHYSICAL EXAM:  VITAL SIGNS: ED Triage Vitals  Enc Vitals Group     BP 12/04/20 1644 (!) 138/93     Pulse Rate 12/04/20 1644 80     Resp 12/04/20 1644 20     Temp 12/04/20 1644 98.3 F (36.8 C)     Temp Source 12/04/20 1644 Oral      SpO2 12/04/20 1644 99 %     Weight 12/04/20 1517 286 lb 9.6 oz (130 kg)     Height 12/04/20 1517 5\' 7"  (1.702 m)     Head Circumference --      Peak Flow --      Pain Score 12/04/20 1517 8     Pain Loc --      Pain Edu? --      Excl. in GC? --      Constitutional: Alert and oriented. Well appearing and in no acute distress. Eyes: Conjunctivae are normal. PERRL. EOMI. Head: Atraumatic. ENT:      Ears:       Nose: No congestion/rhinnorhea.      Mouth/Throat: Mucous membranes are moist.  Neck: No stridor.    Cardiovascular: Normal rate, regular rhythm. Normal S1 and S2.  Good peripheral circulation. Respiratory: Normal respiratory effort  without tachypnea or retractions. Lungs CTAB. Good air entry to the bases with no decreased or absent breath sounds. Musculoskeletal: Full range of motion to all extremities. No gross deformities appreciated.  Visualization of the left wrist reveals edema about the distal radius and ulna.  Limited range of motion due to pain.  Patient is very tender to palpation over the distal radius with no palpable abnormality.  No other significant tenderness over the ostia structures of the hand and wrist.  Sensation and capillary refill intact. Neurologic:  Normal speech and language. No gross focal neurologic deficits are appreciated.  Skin:  Skin is warm, dry and intact. No rash noted. Psychiatric: Mood and affect are normal. Speech and behavior are normal. Patient exhibits appropriate insight and judgement.   ____________________________________________   LABS (all labs ordered are listed, but only abnormal results are displayed)  Labs Reviewed - No data to display ____________________________________________  EKG   ____________________________________________  RADIOLOGY I personally viewed and evaluated these images as part of my medical decision making, as well as reviewing the written report by the radiologist.  ED Provider Interpretation:  Questionable cortical disruption along the distal radius.  DG Wrist Complete Left  Result Date: 12/04/2020 CLINICAL DATA:  Fall, painful wrist. EXAM: LEFT WRIST - COMPLETE 3+ VIEW COMPARISON:  None. FINDINGS: There is no definite acute displaced fracture or dislocation. Vague cortical irregularity and transverse curvilinear lucency through the distal radius. There is no evidence of arthropathy or other focal bone abnormality. Mild subcutaneus soft tissue edema of the wrist. IMPRESSION: No acute displaced fracture or dislocation of the left wrist. Consider repeat x-ray in 7-10 days for further evaluation of a possible nondisplaced distal radial fracture. Electronically Signed   By: Tish FredericksonMorgane  Naveau M.D.   On: 12/04/2020 16:01    ____________________________________________    PROCEDURES  Procedure(s) performed:    .Splint Application  Date/Time: 12/04/2020 6:29 PM Performed by: Racheal Patchesuthriell, Rei Contee D, PA-C Authorized by: Racheal Patchesuthriell, Jahmeer Porche D, PA-C   Consent:    Consent obtained:  Verbal   Consent given by:  Patient   Risks discussed:  Numbness, pain and swelling Universal protocol:    Procedure explained and questions answered to patient or proxy's satisfaction: yes     Patient identity confirmed:  Verbally with patient Pre-procedure details:    Distal neurologic exam:  Normal   Distal perfusion: distal pulses strong and brisk capillary refill   Procedure details:    Location:  Wrist   Wrist location:  L wrist   Splint type:  Volar short arm   Supplies:  Prefabricated splint Post-procedure details:    Distal neurologic exam:  Unchanged   Distal perfusion: unchanged     Procedure completion:  Tolerated well, no immediate complications   Post-procedure imaging: not applicable      Medications  HYDROcodone-acetaminophen (NORCO/VICODIN) 5-325 MG per tablet 1 tablet (has no administration in time range)     ____________________________________________   INITIAL IMPRESSION  / ASSESSMENT AND PLAN / ED COURSE  Pertinent labs & imaging results that were available during my care of the patient were reviewed by me and considered in my medical decision making (see chart for details).  Review of the [place] CSRS was performed in accordance of the [facility] prior to dispensing any controlled drugs.           Patient's diagnosis is consistent with wrist injury.  Patient presented to the emergency department after falling and landing on her outstretched wrist.  Differential included fracture versus  strain.  Imaging is somewhat equivocal at this time as there is questionable cortical disruption.  At this time as it is recommended that the patient have follow-up imaging.  I discussed the results with the patient and need for repeat imaging in 7 to 10 days.  We discussed immobilization and at this time patient states that she will leave a wrist brace on if she has the ability to get into a shower without having to wear a bag over a splint.  I feel this is reasonable as there is not an acute identified a fracture even though there is some concern on x-ray.  Patient be placed in a wrist brace, given medications for symptom relief.  Follow-up with orthopedics for repeat imaging.  Return precautions discussed with the patient. Patient is given ED precautions to return to the ED for any worsening or new symptoms.     ____________________________________________  FINAL CLINICAL IMPRESSION(S) / ED DIAGNOSES  Final diagnoses:  Injury of left wrist, initial encounter      NEW MEDICATIONS STARTED DURING THIS VISIT:  ED Discharge Orders          Ordered    HYDROcodone-acetaminophen (NORCO/VICODIN) 5-325 MG tablet  Every 4 hours PRN        12/04/20 1826    meloxicam (MOBIC) 15 MG tablet  Daily        12/04/20 1826                This chart was dictated using voice recognition software/Dragon. Despite best efforts to proofread, errors can occur which can change the meaning.  Any change was purely unintentional.    Racheal Patches, PA-C 12/04/20 1830    Concha Se, MD 12/05/20 1329

## 2020-12-04 NOTE — ED Triage Notes (Signed)
Pt reports went out last pm and fell hurting her left wrist. Swelling noted

## 2020-12-12 ENCOUNTER — Other Ambulatory Visit: Payer: Self-pay | Admitting: Obstetrics and Gynecology

## 2020-12-12 DIAGNOSIS — A5909 Other urogenital trichomoniasis: Secondary | ICD-10-CM

## 2020-12-18 ENCOUNTER — Emergency Department: Payer: Medicaid Other

## 2020-12-18 ENCOUNTER — Encounter: Payer: Self-pay | Admitting: Emergency Medicine

## 2020-12-18 ENCOUNTER — Other Ambulatory Visit: Payer: Self-pay

## 2020-12-18 ENCOUNTER — Emergency Department
Admission: EM | Admit: 2020-12-18 | Discharge: 2020-12-18 | Disposition: A | Discharge status: Home / Self Care | Payer: Medicaid Other | Attending: Emergency Medicine | Admitting: Emergency Medicine

## 2020-12-18 DIAGNOSIS — K852 Alcohol induced acute pancreatitis without necrosis or infection: Secondary | ICD-10-CM | POA: Diagnosis not present

## 2020-12-18 DIAGNOSIS — R1011 Right upper quadrant pain: Secondary | ICD-10-CM

## 2020-12-18 DIAGNOSIS — K7010 Alcoholic hepatitis without ascites: Secondary | ICD-10-CM

## 2020-12-18 DIAGNOSIS — F17210 Nicotine dependence, cigarettes, uncomplicated: Secondary | ICD-10-CM | POA: Diagnosis not present

## 2020-12-18 DIAGNOSIS — Z7982 Long term (current) use of aspirin: Secondary | ICD-10-CM | POA: Insufficient documentation

## 2020-12-18 DIAGNOSIS — I10 Essential (primary) hypertension: Secondary | ICD-10-CM | POA: Diagnosis not present

## 2020-12-18 DIAGNOSIS — E669 Obesity, unspecified: Secondary | ICD-10-CM | POA: Diagnosis not present

## 2020-12-18 DIAGNOSIS — Z20822 Contact with and (suspected) exposure to covid-19: Secondary | ICD-10-CM | POA: Insufficient documentation

## 2020-12-18 DIAGNOSIS — R52 Pain, unspecified: Secondary | ICD-10-CM

## 2020-12-18 DIAGNOSIS — R109 Unspecified abdominal pain: Secondary | ICD-10-CM | POA: Diagnosis present

## 2020-12-18 DIAGNOSIS — R000 Tachycardia, unspecified: Secondary | ICD-10-CM | POA: Insufficient documentation

## 2020-12-18 LAB — COMPREHENSIVE METABOLIC PANEL
ALT: 565 U/L — ABNORMAL HIGH (ref 0–44)
AST: 226 U/L — ABNORMAL HIGH (ref 15–41)
Albumin: 3.7 g/dL (ref 3.5–5.0)
Alkaline Phosphatase: 326 U/L — ABNORMAL HIGH (ref 38–126)
Anion gap: 6 (ref 5–15)
BUN: 10 mg/dL (ref 6–20)
CO2: 25 mmol/L (ref 22–32)
Calcium: 9.1 mg/dL (ref 8.9–10.3)
Chloride: 105 mmol/L (ref 98–111)
Creatinine, Ser: 0.68 mg/dL (ref 0.44–1.00)
GFR, Estimated: >60 mL/min (ref >60)
Glucose, Bld: 85 mg/dL (ref 70–99)
Potassium: 3.5 mmol/L (ref 3.5–5.1)
Sodium: 136 mmol/L (ref 135–145)
Total Bilirubin: 1.7 mg/dL — ABNORMAL HIGH (ref 0.3–1.2)
Total Protein: 7.1 g/dL (ref 6.5–8.1)

## 2020-12-18 LAB — URINALYSIS, COMPLETE (UACMP) WITH MICROSCOPIC
Bacteria, UA: NONE SEEN
Glucose, UA: NEGATIVE mg/dL
Hgb urine dipstick: NEGATIVE
Ketones, ur: NEGATIVE mg/dL
Leukocytes,Ua: NEGATIVE
Nitrite: NEGATIVE
Specific Gravity, Urine: >1.03 — ABNORMAL HIGH (ref 1.005–1.030)
pH: 5.5 (ref 5.0–8.0)

## 2020-12-18 LAB — CBC
HCT: 38.8 % (ref 36.0–46.0)
Hemoglobin: 12.5 g/dL (ref 12.0–15.0)
MCH: 26.5 pg (ref 26.0–34.0)
MCHC: 32.2 g/dL (ref 30.0–36.0)
MCV: 82.2 fL (ref 80.0–100.0)
Platelets: 359 10*3/uL (ref 150–400)
RBC: 4.72 MIL/uL (ref 3.87–5.11)
RDW: 15.3 % (ref 11.5–15.5)
WBC: 13.3 10*3/uL — ABNORMAL HIGH (ref 4.0–10.5)
nRBC: 0 % (ref 0.0–0.2)

## 2020-12-18 LAB — RESP PANEL BY RT-PCR (FLU A&B, COVID) ARPGX2
Influenza A by PCR: NEGATIVE
Influenza B by PCR: NEGATIVE
SARS Coronavirus 2 by RT PCR: NEGATIVE

## 2020-12-18 LAB — LIPASE, BLOOD: Lipase: 617 U/L — ABNORMAL HIGH (ref 11–51)

## 2020-12-18 LAB — POC URINE PREG, ED: Preg Test, Ur: NEGATIVE

## 2020-12-18 MED ORDER — SUCRALFATE 1 G PO TABS
1.0000 g | ORAL_TABLET | Freq: Once | ORAL | Status: AC
  Administered 2020-12-18: 1 g via ORAL
  Filled 2020-12-18: qty 1

## 2020-12-18 MED ORDER — OXYCODONE HCL 5 MG PO TABS: 5.0000 mg | ORAL_TABLET | Freq: Four times a day (QID) | ORAL | 0 refills | Status: AC | PRN

## 2020-12-18 MED ORDER — LORAZEPAM 2 MG/ML IJ SOLN
0.5000 mg | Freq: Once | INTRAMUSCULAR | Status: AC
  Administered 2020-12-18: 0.5 mg via INTRAVENOUS

## 2020-12-18 MED ORDER — LACTATED RINGERS IV BOLUS
1000.0000 mL | Freq: Once | INTRAVENOUS | Status: AC
  Administered 2020-12-18: 1000 mL via INTRAVENOUS

## 2020-12-18 MED ORDER — PANTOPRAZOLE SODIUM 40 MG IV SOLR
40.0000 mg | Freq: Once | INTRAVENOUS | Status: AC
  Administered 2020-12-18: 40 mg via INTRAVENOUS
  Filled 2020-12-18: qty 40

## 2020-12-18 MED ORDER — MORPHINE SULFATE (PF) 4 MG/ML IV SOLN
6.0000 mg | Freq: Once | INTRAVENOUS | Status: AC
  Administered 2020-12-18: 6 mg via INTRAVENOUS
  Filled 2020-12-18: qty 2

## 2020-12-18 MED ORDER — ALUM & MAG HYDROXIDE-SIMETH 200-200-20 MG/5ML PO SUSP
30.0000 mL | Freq: Once | ORAL | Status: AC
  Administered 2020-12-18: 30 mL via ORAL
  Filled 2020-12-18: qty 30

## 2020-12-18 MED ORDER — ONDANSETRON HCL 4 MG PO TABS: 4.0000 mg | ORAL_TABLET | Freq: Three times a day (TID) | ORAL | 0 refills | Status: DC | PRN

## 2020-12-18 MED ORDER — MORPHINE SULFATE (PF) 4 MG/ML IV SOLN
6.0000 mg | Freq: Once | INTRAVENOUS | Status: AC
Start: 2020-12-18 — End: 2020-12-18
  Administered 2020-12-18: 6 mg via INTRAVENOUS
  Filled 2020-12-18: qty 2

## 2020-12-18 MED ORDER — LORAZEPAM 2 MG/ML IJ SOLN
1.0000 mg | Freq: Once | INTRAMUSCULAR | Status: AC
  Administered 2020-12-18: 1 mg via INTRAVENOUS
  Filled 2020-12-18: qty 1

## 2020-12-18 MED ORDER — GADOBUTROL 1 MMOL/ML IV SOLN
10.0000 mL | Freq: Once | INTRAVENOUS | Status: AC | PRN
  Administered 2020-12-18: 10 mL via INTRAVENOUS
  Filled 2020-12-18: qty 10

## 2020-12-18 MED ORDER — OXYCODONE HCL 5 MG PO TABS
5.0000 mg | ORAL_TABLET | ORAL | Status: AC
  Administered 2020-12-18: 5 mg via ORAL
  Filled 2020-12-18: qty 1

## 2020-12-18 NOTE — ED Notes (Signed)
Pt NAD, a/ox4. Pt verbalizes understanding of all DC and f/u instructions. All questions answered. Pt walks with steady gait to lobby at DC where she states her friend is waiting for her to drive her home.

## 2020-12-18 NOTE — ED Provider Notes (Signed)
[facility] Emergency Department Provider Note  ____________________________________________   Event Date/Time   First MD Initiated Contact with Patient 12/18/20 1710     (approximate)  I have reviewed the triage vital signs and the nursing notes.   HISTORY  Chief Complaint Abdominal Pain   HPI Ashley Howell is a 30 y.o. female with past medical history of anxiety, ASD, HTN and PFO who presents for assessment of some epigastric right upper quadrant abdominal pain.  Patient states its been on and off for several months but got particularly severe last night.  She states she took some ibuprofen and oxycodone she had leftover from the pregnancy but this pain has not improved.  She denies any significant lower abdominal pain, vaginal bleeding, discharge, pain with urination, diarrhea, constipation, vomiting, chest pain, cough, shortness of breath, headache, earache, rash or other acute sick symptoms.  States she is not take her blood pressure medicines today.  No other acute concerns at this time.  She states she had 2 shots of liquor last night but no other recent alcohol use and she is not a daily drinker.  No significant recent Tylenol use.         Past Medical History:  Diagnosis Date   Anxiety    ASD (atrial septal defect)    Benign essential hypertension, antepartum 05/19/2018   160/99 at anatomy scan 18 weeks   Pt sent to ER on 05/15/2018 resolved with rest   Elevated at u/s visit - labetalol 100 mg bid initiated   Hx of cardiomegaly    Hypertension    PFO (patent foramen ovale)    a. 12/2014 Echo: EF 55-60%, mildly dil LA. PFO w/o significant shunting (reviewed by Dr. Rockey Situ 07/2018).   Shortness of breath dyspnea    Spinal headache     Patient Active Problem List   Diagnosis Date Noted   History of cesarean section 10/06/2020   Encounter for sterilization    [redacted] weeks gestation of pregnancy    Poor fetal growth affecting management of mother  in third trimester 09/08/2020   Gestational hypertension 08/23/2020   Pregnancy affected by fetal growth restriction 07/13/2020   Decreased fetal movement affecting management of pregnancy in second trimester 07/03/2020   Supervision of high-risk pregnancy, third trimester 03/16/2020   ASD (atrial septal defect) 03/16/2020   Bacterial vaginosis 06/25/2019   Dysplasia of cervix, low grade (CIN 1) 12/11/2018   Previous cesarean delivery, antepartum 10/09/2018   BMI 40.0-44.9, adult ([place]) 09/04/2018   Chronic hypertension 05/19/2018   PFO (patent foramen ovale) 02/18/2015   Obesity in pregnancy, antepartum 02/15/2015   Previous cesarean delivery, antepartum condition or complication 51/76/1607   History of cardiomegaly 10/28/2014    Past Surgical History:  Procedure Laterality Date   CESAREAN SECTION  10/28/2008   CESAREAN SECTION N/A 02/22/2015   Procedure: CESAREAN SECTION;  Surgeon: Gae Dry, MD;  Location: [facility] ORS;  Service: Obstetrics;  Laterality: N/A;   CESAREAN SECTION  04/13/2010   CESAREAN SECTION N/A 10/09/2018   Procedure: CESAREAN SECTION;  Surgeon: Homero Fellers, MD;  Location: [facility] ORS;  Service: Obstetrics;  Laterality: N/A;   CESAREAN SECTION WITH BILATERAL TUBAL LIGATION Bilateral 10/06/2020   Procedure: CESAREAN SECTION WITH BILATERAL TUBAL LIGATION;  Surgeon: Homero Fellers, MD;  Location: [facility] ORS;  Service: Obstetrics;  Laterality: Bilateral;    Prior to Admission medications   Medication Sig Start Date End Date Taking? Authorizing Provider  ondansetron (ZOFRAN) 4 MG  tablet Take 1 tablet (4 mg total) by mouth every 8 (eight) hours as needed for up to 10 doses for nausea or vomiting. 12/18/20  Yes Lucrezia Starch, MD  oxyCODONE (ROXICODONE) 5 MG immediate release tablet Take 1 tablet (5 mg total) by mouth every 6 (six) hours as needed for up to 4 days for severe pain. 12/18/20 12/22/20 Yes Lucrezia Starch, MD  aspirin 81 MG EC tablet Chew 81 mg by  mouth daily.    [provider]  ferrous sulfate 325 (65 FE) MG tablet Take 1 tablet (325 mg total) by mouth 2 (two) times daily with a meal. 10/08/20   Will Bonnet, MD  ibuprofen (ADVIL) 600 MG tablet Take 1 tablet (600 mg total) by mouth every 6 (six) hours. 10/08/20   Will Bonnet, MD  meloxicam (MOBIC) 15 MG tablet Take 1 tablet (15 mg total) by mouth daily. 12/04/20   Cuthriell, Charline Bills, PA-C  metroNIDAZOLE (FLAGYL) 500 MG tablet TAKE 2 TABLETS BY MOUTH TWICE DAILY FOR  1  DAY,  OR  IF  YOU  CAN  TOLERATE,  TAKE  ALL  4  TABS  AS  A  SINGLE  DOSE 12/13/20   Schuman, Christanna R, MD  metroNIDAZOLE (METROGEL) 0.75 % vaginal gel Place 1 Applicatorful vaginally at bedtime. Apply one applicatorful to vagina at bedtime for 5 days 11/24/20   Homero Fellers, MD  NIFEdipine (PROCARDIA-XL/NIFEDICAL-XL) 30 MG 24 hr tablet Take 3 tablets (90 mg total) by mouth daily. 08/24/20   Homero Fellers, MD    Allergies Patient has no known allergies.  Family History  Problem Relation Age of Onset   Hypertension Mother    Stroke Paternal Aunt    Cancer Maternal Grandfather        Colon    Social History Social History   Tobacco Use   Smoking status: Some Days    Packs/day: 0.25    Years: 2.00    Pack years: 0.50    Types: Cigarettes   Smokeless tobacco: Never  Vaping Use   Vaping Use: Never used  Substance Use Topics   Alcohol use: No   Drug use: No    Review of Systems  Review of Systems  Constitutional:  Negative for chills and fever.  HENT:  Negative for sore throat.   Eyes:  Negative for pain.  Respiratory:  Negative for cough and stridor.   Cardiovascular:  Negative for chest pain.  Gastrointestinal:  Positive for abdominal pain and nausea. Negative for vomiting.  Genitourinary:  Negative for dysuria.  Musculoskeletal:  Negative for myalgias.  Skin:  Negative for rash.  Neurological:  Negative for seizures, loss of consciousness and headaches.   Psychiatric/Behavioral:  Negative for suicidal ideas.   All other systems reviewed and are negative.    ____________________________________________   PHYSICAL EXAM:  VITAL SIGNS: ED Triage Vitals  Enc Vitals Group     BP 12/18/20 1706 (S) (!) 170/104     Pulse Rate 12/18/20 1706 (!) 116     Resp 12/18/20 1706 20     Temp 12/18/20 1706 98.7 F (37.1 C)     Temp Source 12/18/20 1706 Oral     SpO2 12/18/20 1706 99 %     Weight 12/18/20 1702 287 lb (130.2 kg)     Height 12/18/20 1702 '5\' 7"'  (1.702 m)     Head Circumference --      Peak Flow --  Pain Score 12/18/20 1702 10     Pain Loc --      Pain Edu? --      Excl. in [place]? --    Vitals:   12/18/20 1706 12/18/20 2209  BP: (S) (!) 170/104 (!) 152/97  Pulse: (!) 116 91  Resp: 20 18  Temp: 98.7 F (37.1 C)   SpO2: 99% 96%   Physical Exam Vitals and nursing note reviewed.  Constitutional:      General: She is not in acute distress.    Appearance: She is well-developed. She is obese.  HENT:     Head: Normocephalic and atraumatic.     Right Ear: External ear normal.     Left Ear: External ear normal.     Mouth/Throat:     Mouth: Mucous membranes are dry.  Eyes:     Conjunctiva/sclera: Conjunctivae normal.  Cardiovascular:     Rate and Rhythm: Normal rate and regular rhythm.     Heart sounds: No murmur heard. Pulmonary:     Effort: Pulmonary effort is normal. No respiratory distress.     Breath sounds: Normal breath sounds.  Abdominal:     Palpations: Abdomen is soft.     Tenderness: There is abdominal tenderness in the right upper quadrant and epigastric area.  Musculoskeletal:     Cervical back: Neck supple.  Skin:    General: Skin is warm and dry.     Capillary Refill: Capillary refill takes 2 to 3 seconds.  Neurological:     Mental Status: She is alert.  Psychiatric:        Mood and Affect: Mood normal.     ____________________________________________   LABS (all labs ordered are listed, but only  abnormal results are displayed)  Labs Reviewed  LIPASE, BLOOD - Abnormal; Notable for the following components:      Result Value   Lipase 617 (*)    All other components within normal limits  COMPREHENSIVE METABOLIC PANEL - Abnormal; Notable for the following components:   AST 226 (*)    ALT 565 (*)    Alkaline Phosphatase 326 (*)    Total Bilirubin 1.7 (*)    All other components within normal limits  CBC - Abnormal; Notable for the following components:   WBC 13.3 (*)    All other components within normal limits  URINALYSIS, COMPLETE (UACMP) WITH MICROSCOPIC - Abnormal; Notable for the following components:   APPearance HAZY (*)    Specific Gravity, Urine >1.030 (*)    Bilirubin Urine SMALL (*)    Protein, ur TRACE (*)    All other components within normal limits  RESP PANEL BY RT-PCR (FLU A&B, COVID) ARPGX2  HEPATITIS PANEL, ACUTE  ACETAMINOPHEN LEVEL  POC URINE PREG, ED   ____________________________________________  EKG  Sinus tachycardia with a ventricle rate of 114, normal axis, unremarkable intervals without clear evidence of acute ischemia. ____________________________________________  RADIOLOGY  ED MD interpretation: Right upper quadrant ultrasound shows evidence of cholelithiasis without evidence of acute cholecystitis.  There is also evidence of possible hepatic steatosis.  Common bile duct is 5 mm.  Portal vein is patent on color Doppler imaging.  MR PC is somewhat motion degraded but shows noncomplicated acute pancreatitis.  There is no evidence of cholecystitis, intra or extrahepatic biliary duct dilation or choledocholithiasis.  Official radiology report(s): MR 3D Recon At Scanner  Result Date: 12/18/2020 CLINICAL DATA:  Right upper quadrant abdominal pain, transaminitis EXAM: MRI ABDOMEN WITHOUT AND WITH CONTRAST (INCLUDING MRCP)  TECHNIQUE: Multiplanar multisequence MR imaging of the abdomen was performed both before and after the administration of  intravenous contrast. Heavily T2-weighted images of the biliary and pancreatic ducts were obtained, and three-dimensional MRCP images were rendered by post processing. CONTRAST:  42m GADAVIST GADOBUTROL 1 MMOL/ML IV SOLN COMPARISON:  Right upper quadrant ultrasound dated 12/18/2020 FINDINGS: Motion degraded images. Particularly, the dynamic postcontrast imaging is motion degraded. Lower chest: Lung bases are clear. Hepatobiliary: Liver is within normal limits. No suspicious/enhancing hepatic lesions. No hepatic steatosis. Layering gallstones (series 4/image 25) with mild gallbladder distention but no gallbladder wall thickening or pericholecystic inflammatory changes. No intrahepatic or extrahepatic ductal dilatation. Common duct measures 6 mm. No choledocholithiasis is seen. Pancreas: Peripancreatic fluid/inflammatory changes along the pancreatic body/tail, suggesting acute pancreatitis. No pancreatic ductal dilatation or well-defined fluid collection/walled-off necrosis. Spleen:  Spleen is within normal limits. Adrenals/Urinary Tract:  Adrenal glands are within normal limits. Kidneys are within normal limits, noting perinephric fluid bilaterally. No hydronephrosis. Stomach/Bowel: Stomach is within normal limits. Visualized bowel is grossly unremarkable. Vascular/Lymphatic:  No evidence of abdominal aortic aneurysm. No suspicious abdominal lymphadenopathy. Other: Peripancreatic and retroperitoneal fluid, as described above. Musculoskeletal: No focal osseous lesions. IMPRESSION: Motion degraded images. Uncomplicated acute pancreatitis. Cholelithiasis, without associated inflammatory changes. No intrahepatic or extrahepatic ductal dilatation. No choledocholithiasis is seen. Electronically Signed   By: SJulian HyM.D.   On: 12/18/2020 22:35   MR ABDOMEN MRCP W WO CONTAST  Result Date: 12/18/2020 CLINICAL DATA:  Right upper quadrant abdominal pain, transaminitis EXAM: MRI ABDOMEN WITHOUT AND WITH CONTRAST  (INCLUDING MRCP) TECHNIQUE: Multiplanar multisequence MR imaging of the abdomen was performed both before and after the administration of intravenous contrast. Heavily T2-weighted images of the biliary and pancreatic ducts were obtained, and three-dimensional MRCP images were rendered by post processing. CONTRAST:  14mGADAVIST GADOBUTROL 1 MMOL/ML IV SOLN COMPARISON:  Right upper quadrant ultrasound dated 12/18/2020 FINDINGS: Motion degraded images. Particularly, the dynamic postcontrast imaging is motion degraded. Lower chest: Lung bases are clear. Hepatobiliary: Liver is within normal limits. No suspicious/enhancing hepatic lesions. No hepatic steatosis. Layering gallstones (series 4/image 25) with mild gallbladder distention but no gallbladder wall thickening or pericholecystic inflammatory changes. No intrahepatic or extrahepatic ductal dilatation. Common duct measures 6 mm. No choledocholithiasis is seen. Pancreas: Peripancreatic fluid/inflammatory changes along the pancreatic body/tail, suggesting acute pancreatitis. No pancreatic ductal dilatation or well-defined fluid collection/walled-off necrosis. Spleen:  Spleen is within normal limits. Adrenals/Urinary Tract:  Adrenal glands are within normal limits. Kidneys are within normal limits, noting perinephric fluid bilaterally. No hydronephrosis. Stomach/Bowel: Stomach is within normal limits. Visualized bowel is grossly unremarkable. Vascular/Lymphatic:  No evidence of abdominal aortic aneurysm. No suspicious abdominal lymphadenopathy. Other: Peripancreatic and retroperitoneal fluid, as described above. Musculoskeletal: No focal osseous lesions. IMPRESSION: Motion degraded images. Uncomplicated acute pancreatitis. Cholelithiasis, without associated inflammatory changes. No intrahepatic or extrahepatic ductal dilatation. No choledocholithiasis is seen. Electronically Signed   By: SrJulian Hy.D.   On: 12/18/2020 22:35   [place]OM[place] LIMITED RUQ  (LIVER/GB)  Result Date: 12/18/2020 CLINICAL DATA:  Right upper quadrant abdominal pain x1 day EXAM: ULTRASOUND ABDOMEN LIMITED RIGHT UPPER QUADRANT COMPARISON:  None. FINDINGS: Gallbladder: Multiple layering gallstones measuring up to 8 mm. No gallbladder wall thickening or pericholecystic fluid. Negative sonographic Murphy's sign. Common bile duct: Diameter: 5 mm Liver: Hyperechoic hepatic parenchyma. No focal hepatic lesion is seen. Portal vein is patent on color Doppler imaging with normal direction of blood flow towards the liver. Other: None. IMPRESSION: Cholelithiasis,  without associated sonographic findings to suggest acute cholecystitis. Possible hepatic steatosis. Electronically Signed   By: Julian Hy M.D.   On: 12/18/2020 19:10    ____________________________________________   PROCEDURES  Procedure(s) performed (including Critical Care):  Procedures   ____________________________________________   INITIAL IMPRESSION / ASSESSMENT AND PLAN / ED COURSE      Patient presents with above-stated history exam for assessment of some acute on chronic right upper quadrant epigastric abdominal pain.  On arrival she is tachycardic at 116 and slight hypertensive BP of 1 7104 with otherwise stable vital signs on room air.  She is fairly tender in her epigastrium and right upper quadrant.  Differential includes acute cholecystitis, choledocholithiasis, pancreatitis, gastritis, and peptic ulcer disease.  CMP remarkable for cholestatic pattern with an AST of 226, ALT of 565 and alk phos of 326 with acute bili 1.7.  No other significant electrolyte or metabolic derangements.  Lipase consistent with acute pancreatitis at 617.  UA has some trace protein and small bilirubin but is not suggestive of cystitis.  Pregnancy test is negative.  COVID influenza PCR is negative.  Right upper quadrant ultrasound shows evidence of cholelithiasis without evidence of acute cholecystitis.  There is also  evidence of possible hepatic steatosis.  Common bile duct is 5 mm.  Portal vein is patent on color Doppler imaging.  MR PC is somewhat motion degraded but shows noncomplicated acute pancreatitis.  There is no evidence of cholecystitis, intra or extrahepatic biliary duct dilation or choledocholithiasis.  Discussed elevated alk phos and overall presentation and work-up with on-call GI physician Dr. Virgina Jock felt that elevated alk phos could be related to alcoholic hepatitis and pancreatitis not necessarily related to recently passed stone.  Recommended supportive care.  Did discuss with patient and offered admission for hydration pain control and nausea control.  However patient states she is now feeling much better and given stable vitals with patient now tolerating p.o. and her pain and nausea are much improved no evidence of complication on MRCP I think discharge is reasonable with close outpatient follow-up.  Advised patient she must have her liver enzymes rechecked and importance of abstaining from alcohol.  Rx written for Zofran and Roxicodone.  Will defer Tylenol given degree of hepatitis.  Advise she can follow-up viral hepatitis panel with her PCP.  Discharged in stable condition.  Strict return precautions advised and discussed. ____________________________________________   FINAL CLINICAL IMPRESSION(S) / ED DIAGNOSES  Final diagnoses:  RUQ pain  Pain  Alcohol-induced acute pancreatitis, unspecified complication status  Alcoholic hepatitis, unspecified whether ascites present    Medications  oxyCODONE (Oxy IR/ROXICODONE) immediate release tablet 5 mg (has no administration in time range)  lactated ringers bolus 1,000 mL (0 mLs Intravenous Stopped 12/18/20 2220)  pantoprazole (PROTONIX) injection 40 mg (40 mg Intravenous Given 12/18/20 1758)  sucralfate (CARAFATE) tablet 1 g (1 g Oral Given 12/18/20 1759)  alum & mag hydroxide-simeth (MAALOX/MYLANTA) 200-200-20 MG/5ML suspension 30 mL (30 mLs  Oral Given 12/18/20 1759)  morphine 4 MG/ML injection 6 mg (6 mg Intravenous Given 12/18/20 1754)  morphine 4 MG/ML injection 6 mg (6 mg Intravenous Given 12/18/20 2041)  LORazepam (ATIVAN) injection 1 mg (1 mg Intravenous Given 12/18/20 2050)  LORazepam (ATIVAN) injection 0.5 mg (0.5 mg Intravenous Given 12/18/20 2051)  gadobutrol (GADAVIST) 1 MMOL/ML injection 10 mL (10 mLs Intravenous Contrast Given 12/18/20 2221)     ED Discharge Orders          Ordered    oxyCODONE (ROXICODONE) 5  MG immediate release tablet  Every 6 hours PRN        12/18/20 2314    ondansetron (ZOFRAN) 4 MG tablet  Every 8 hours PRN        12/18/20 2314             Note:  This document was prepared using Dragon voice recognition software and may include unintentional dictation errors.    Lucrezia Starch, MD 12/18/20 [phone]

## 2020-12-18 NOTE — ED Triage Notes (Signed)
Pt via POV from home. Pt c/o upper abd pain since last night. Pt states it radiates to her back. Denies NVD. Denies fever. Pt still has appendix and gallbladder. Pt states it happens all of a sudden and it comes and goes. Pt is A&OX4 and NAD.

## 2020-12-18 NOTE — Discharge Instructions (Addendum)
It is vital that you have your liver enzymes rechecked again by your primary care doctor in the next 5 to 7 days.  Please abstain from any future alcohol and avoid Tylenol the following week as you had some hepatitis which I suspect is likely related to alcohol use.  We did send a viral hepatitis panel which you will need to follow-up with your primary care doctor.

## 2020-12-19 LAB — HEPATITIS PANEL, ACUTE
HCV Ab: NONREACTIVE
Hep A IgM: NONREACTIVE
Hep B C IgM: NONREACTIVE
Hepatitis B Surface Ag: NONREACTIVE

## 2020-12-19 LAB — ACETAMINOPHEN LEVEL: Acetaminophen (Tylenol), Serum: <10 ug/mL — ABNORMAL LOW (ref 10–30)

## 2021-01-04 ENCOUNTER — Encounter (HOSPITAL_COMMUNITY): Payer: Self-pay | Admitting: Radiology

## 2021-03-25 ENCOUNTER — Encounter: Payer: Self-pay | Admitting: Emergency Medicine

## 2021-03-25 ENCOUNTER — Emergency Department
Admission: EM | Admit: 2021-03-25 | Discharge: 2021-03-25 | Disposition: A | Discharge status: Home / Self Care | Payer: Medicaid Other | Source: Home / Self Care | Attending: Emergency Medicine | Admitting: Emergency Medicine

## 2021-03-25 ENCOUNTER — Other Ambulatory Visit: Payer: Self-pay

## 2021-03-25 DIAGNOSIS — K859 Acute pancreatitis without necrosis or infection, unspecified: Secondary | ICD-10-CM | POA: Insufficient documentation

## 2021-03-25 DIAGNOSIS — Z7982 Long term (current) use of aspirin: Secondary | ICD-10-CM | POA: Insufficient documentation

## 2021-03-25 DIAGNOSIS — Z5321 Procedure and treatment not carried out due to patient leaving prior to being seen by health care provider: Secondary | ICD-10-CM | POA: Insufficient documentation

## 2021-03-25 DIAGNOSIS — F17210 Nicotine dependence, cigarettes, uncomplicated: Secondary | ICD-10-CM | POA: Insufficient documentation

## 2021-03-25 DIAGNOSIS — I10 Essential (primary) hypertension: Secondary | ICD-10-CM | POA: Insufficient documentation

## 2021-03-25 LAB — COMPREHENSIVE METABOLIC PANEL
ALT: 699 U/L — ABNORMAL HIGH (ref 0–44)
AST: 1163 U/L — ABNORMAL HIGH (ref 15–41)
Albumin: 4 g/dL (ref 3.5–5.0)
Alkaline Phosphatase: 194 U/L — ABNORMAL HIGH (ref 38–126)
Anion gap: 6 (ref 5–15)
BUN: 10 mg/dL (ref 6–20)
CO2: 24 mmol/L (ref 22–32)
Calcium: 9.1 mg/dL (ref 8.9–10.3)
Chloride: 107 mmol/L (ref 98–111)
Creatinine, Ser: 0.73 mg/dL (ref 0.44–1.00)
GFR, Estimated: >60 mL/min (ref >60)
Glucose, Bld: 99 mg/dL (ref 70–99)
Potassium: 3.6 mmol/L (ref 3.5–5.1)
Sodium: 137 mmol/L (ref 135–145)
Total Bilirubin: 3.4 mg/dL — ABNORMAL HIGH (ref 0.3–1.2)
Total Protein: 7.9 g/dL (ref 6.5–8.1)

## 2021-03-25 LAB — CBC
HCT: 42.2 % (ref 36.0–46.0)
Hemoglobin: 13.1 g/dL (ref 12.0–15.0)
MCH: 24.8 pg — ABNORMAL LOW (ref 26.0–34.0)
MCHC: 31 g/dL (ref 30.0–36.0)
MCV: 79.8 fL — ABNORMAL LOW (ref 80.0–100.0)
Platelets: 380 10*3/uL (ref 150–400)
RBC: 5.29 MIL/uL — ABNORMAL HIGH (ref 3.87–5.11)
RDW: 16.1 % — ABNORMAL HIGH (ref 11.5–15.5)
WBC: 9.7 10*3/uL (ref 4.0–10.5)
nRBC: 0 % (ref 0.0–0.2)

## 2021-03-25 LAB — LIPASE, BLOOD: Lipase: 8036 U/L — ABNORMAL HIGH (ref 11–51)

## 2021-03-25 NOTE — ED Provider Notes (Addendum)
Emergency Medicine Provider Triage Evaluation Note  Ashley Howell , a 30 y.o. female  was evaluated in triage.  Pt complains of abdominal pain, nausea and vomiting. Hx of pancreatitis.   Review of Systems  Positive: Abdominal pain, nausea, vomiting Negative: Constipation, diarrhea, blood in her stool, urinary urgency, frequency, dysuria, vaginal discharge, irritation, odor or abnormal uterine bleeding  Physical Exam  Ht 5\' 7"  (1.702 m)    Wt 127 kg    LMP 03/08/2021 (Approximate)    BMI 43.85 kg/m  Gen:   Awake, appears in pain Resp:  Tachypneic Cardio:  Bradycardic Abd:  Guarding, bent over in pain  Medical Decision Making  Medically screening exam initiated at 5:32 PM.  Appropriate orders placed.  SOLEY HARRISS was informed that the remainder of the evaluation will be completed by another provider, this initial triage assessment does not replace that evaluation, and the importance of remaining in the ED until their evaluation is complete.  Abdominal Pain, Nausea, Vomiting, Hx of Pancreatitis  Urinalysis, urine preg, CBC, CMET, Lipase       Apolinar Junes, NP 03/25/21 1735    03/27/21, MD 03/25/21 1946

## 2021-03-25 NOTE — ED Notes (Signed)
Dr. Darnelle Catalan attempted to call pt on phone when she did not answer in the lobby.  Pt phone not working. Pt called again in the lobby- no answer.

## 2021-03-25 NOTE — ED Triage Notes (Signed)
Pt reports episode of pancreatitis that started last night and has gotten worse today. Pt states she was dx'd with it several months ago and all the sx's she is having including the pain

## 2021-03-25 NOTE — ED Provider Notes (Signed)
[facility] Emergency Department Provider Note   ____________________________________________   Event Date/Time   First MD Initiated Contact with Patient 03/25/21 1956     (approximate)  I have reviewed the triage vital signs and the nursing notes.   HISTORY  Chief Complaint Abdominal Pain, Nausea, and Emesis    HPI Ashley Howell is a 30 y.o. female patient seen in triage by screening provider.  She had complained of abdominal pain nausea and vomiting and history of pancreatitis.  Labs returned showed a lipase of 8000+, AST of 1163, ALT of 699, H&H white count were normal calcium was normal other electrolytes were normal.  Went to call patient to facilitate admission she was not there.  We will give her a call on the telephone tell her to come back for admission.   ----------------------------------------- 8:00 PM on 03/25/2021 ----------------------------------------- Called patient's phone call cannot be completed at this time.  Tried this twice with the same result.  Called mother's phone with multiple rings and then also call cannot be completed at this time.      Past Medical History:  Diagnosis Date   Anxiety    ASD (atrial septal defect)    Benign essential hypertension, antepartum 05/19/2018   160/99 at anatomy scan 18 weeks   Pt sent to ER on 05/15/2018 resolved with rest   Elevated at u/s visit - labetalol 100 mg bid initiated   Hx of cardiomegaly    Hypertension    PFO (patent foramen ovale)    a. 12/2014 Echo: EF 55-60%, mildly dil LA. PFO w/o significant shunting (reviewed by Dr. Mariah Milling 07/2018).   Shortness of breath dyspnea    Spinal headache     Patient Active Problem List   Diagnosis Date Noted   History of cesarean section 10/06/2020   Encounter for sterilization    [redacted] weeks gestation of pregnancy    Poor fetal growth affecting management of mother in third trimester 09/08/2020   Gestational hypertension 08/23/2020    Pregnancy affected by fetal growth restriction 07/13/2020   Decreased fetal movement affecting management of pregnancy in second trimester 07/03/2020   Supervision of high-risk pregnancy, third trimester 03/16/2020   ASD (atrial septal defect) 03/16/2020   Bacterial vaginosis 06/25/2019   Dysplasia of cervix, low grade (CIN 1) 12/11/2018   Previous cesarean delivery, antepartum 10/09/2018   BMI 40.0-44.9, adult (HCC) 09/04/2018   Chronic hypertension 05/19/2018   PFO (patent foramen ovale) 02/18/2015   Obesity in pregnancy, antepartum 02/15/2015   Previous cesarean delivery, antepartum condition or complication 02/08/2015   History of cardiomegaly 10/28/2014    Past Surgical History:  Procedure Laterality Date   CESAREAN SECTION  10/28/2008   CESAREAN SECTION N/A 02/22/2015   Procedure: CESAREAN SECTION;  Surgeon: Nadara Mustard, MD;  Location: [facility] ORS;  Service: Obstetrics;  Laterality: N/A;   CESAREAN SECTION  04/13/2010   CESAREAN SECTION N/A 10/09/2018   Procedure: CESAREAN SECTION;  Surgeon: Natale Milch, MD;  Location: [facility] ORS;  Service: Obstetrics;  Laterality: N/A;   CESAREAN SECTION WITH BILATERAL TUBAL LIGATION Bilateral 10/06/2020   Procedure: CESAREAN SECTION WITH BILATERAL TUBAL LIGATION;  Surgeon: Natale Milch, MD;  Location: [facility] ORS;  Service: Obstetrics;  Laterality: Bilateral;    Prior to Admission medications   Medication Sig Start Date End Date Taking? Authorizing Provider  aspirin 81 MG EC tablet Chew 81 mg by mouth daily.    [provider]  ferrous sulfate 325 (65 FE)  MG tablet Take 1 tablet (325 mg total) by mouth 2 (two) times daily with a meal. 10/08/20   Conard Novak, MD  ibuprofen (ADVIL) 600 MG tablet Take 1 tablet (600 mg total) by mouth every 6 (six) hours. 10/08/20   Conard Novak, MD  meloxicam (MOBIC) 15 MG tablet Take 1 tablet (15 mg total) by mouth daily. 12/04/20   Cuthriell, Delorise Royals, PA-C  metroNIDAZOLE  (FLAGYL) 500 MG tablet TAKE 2 TABLETS BY MOUTH TWICE DAILY FOR  1  DAY,  OR  IF  YOU  CAN  TOLERATE,  TAKE  ALL  4  TABS  AS  A  SINGLE  DOSE 12/13/20   Schuman, Christanna R, MD  metroNIDAZOLE (METROGEL) 0.75 % vaginal gel Place 1 Applicatorful vaginally at bedtime. Apply one applicatorful to vagina at bedtime for 5 days 11/24/20   Natale Milch, MD  NIFEdipine (PROCARDIA-XL/NIFEDICAL-XL) 30 MG 24 hr tablet Take 3 tablets (90 mg total) by mouth daily. 08/24/20   Schuman, Jaquelyn Bitter, MD  ondansetron (ZOFRAN) 4 MG tablet Take 1 tablet (4 mg total) by mouth every 8 (eight) hours as needed for up to 10 doses for nausea or vomiting. 12/18/20   Gilles Chiquito, MD    Allergies Patient has no known allergies.  Family History  Problem Relation Age of Onset   Hypertension Mother    Stroke Paternal Aunt    Cancer Maternal Grandfather        Colon    Social History Social History   Tobacco Use   Smoking status: Some Days    Packs/day: 0.25    Years: 2.00    Pack years: 0.50    Types: Cigarettes   Smokeless tobacco: Never  Vaping Use   Vaping Use: Never used  Substance Use Topics   Alcohol use: No   Drug use: No    Review of Systems Unable to complete as patient had left  ____________________________________________   PHYSICAL EXAM:  VITAL SIGNS: ED Triage Vitals  Enc Vitals Group     BP 03/25/21 1732 (!) 206/123     Pulse Rate 03/25/21 1732 (!) 57     Resp 03/25/21 1732 20     Temp 03/25/21 1732 98.6 F (37 C)     Temp Source 03/25/21 1732 Oral     SpO2 03/25/21 1732 94 %     Weight 03/25/21 1724 280 lb (127 kg)     Height 03/25/21 1724 5\' 7"  (1.702 m)     Head Circumference --      Peak Flow --      Pain Score 03/25/21 1724 10     Pain Loc --      Pain Edu? --      Excl. in GC? --   Physical exam: Unable to complete as patient had left ____________________________________________   LABS (all labs ordered are listed, but only abnormal results are  displayed)  Labs Reviewed  LIPASE, BLOOD - Abnormal; Notable for the following components:      Result Value   Lipase 8,036 (*)    All other components within normal limits  COMPREHENSIVE METABOLIC PANEL - Abnormal; Notable for the following components:   AST 1,163 (*)    ALT 699 (*)    Alkaline Phosphatase 194 (*)    Total Bilirubin 3.4 (*)    All other components within normal limits  CBC - Abnormal; Notable for the following components:   RBC 5.29 (*)  MCV 79.8 (*)    MCH 24.8 (*)    RDW 16.1 (*)    All other components within normal limits  URINALYSIS, ROUTINE W REFLEX MICROSCOPIC  POC URINE PREG, ED   ____________________________________________  EKG   ____________________________________________  RADIOLOGY Jill Poling, personally viewed and evaluated these images (plain radiographs) as part of my medical decision making, as well as reviewing the written report by the radiologist.  ED MD interpretation:    Official radiology report(s): No results found.  ____________________________________________   PROCEDURES  Procedure(s) performed (including Critical Care):  Procedures   ____________________________________________   INITIAL IMPRESSION / ASSESSMENT AND PLAN / ED COURSE ----------------------------------------- 9:51 PM on 03/25/2021 -----------------------------------------  In spite of several efforts call the patient we have been unable to contact her or her emergency contact.  Latest of those efforts occurred while I was dictating this part of the note.  This was at least 4 phone calls to the patient anterior to the patient's mother.  I have no other contact information to try.          ____________________________________________   FINAL CLINICAL IMPRESSION(S) / ED DIAGNOSES  Final diagnoses:  Acute pancreatitis, unspecified complication status, unspecified pancreatitis type  Hypertension, unspecified type     ED  Discharge Orders     None        Note:  This document was prepared using Dragon voice recognition software and may include unintentional dictation errors.    Arnaldo Natal, MD 03/25/21 2152

## 2021-03-26 ENCOUNTER — Encounter: Payer: Self-pay | Admitting: Emergency Medicine

## 2021-03-26 ENCOUNTER — Emergency Department: Payer: Medicaid Other

## 2021-03-26 ENCOUNTER — Other Ambulatory Visit: Payer: Self-pay

## 2021-03-26 ENCOUNTER — Inpatient Hospital Stay
Admission: EM | Admit: 2021-03-26 | Discharge: 2021-03-30 | DRG: 439 | Disposition: A | Discharge status: Home / Self Care | Payer: Medicaid Other | Attending: Internal Medicine | Admitting: Internal Medicine

## 2021-03-26 DIAGNOSIS — K831 Obstruction of bile duct: Secondary | ICD-10-CM

## 2021-03-26 DIAGNOSIS — I10 Essential (primary) hypertension: Secondary | ICD-10-CM | POA: Diagnosis present

## 2021-03-26 DIAGNOSIS — K8071 Calculus of gallbladder and bile duct without cholecystitis with obstruction: Secondary | ICD-10-CM | POA: Diagnosis present

## 2021-03-26 DIAGNOSIS — R7989 Other specified abnormal findings of blood chemistry: Secondary | ICD-10-CM | POA: Diagnosis not present

## 2021-03-26 DIAGNOSIS — Z800 Family history of malignant neoplasm of digestive organs: Secondary | ICD-10-CM | POA: Diagnosis not present

## 2021-03-26 DIAGNOSIS — K851 Biliary acute pancreatitis without necrosis or infection: Principal | ICD-10-CM | POA: Diagnosis present

## 2021-03-26 DIAGNOSIS — I160 Hypertensive urgency: Secondary | ICD-10-CM | POA: Diagnosis present

## 2021-03-26 DIAGNOSIS — K8020 Calculus of gallbladder without cholecystitis without obstruction: Secondary | ICD-10-CM

## 2021-03-26 DIAGNOSIS — Q2112 Patent foramen ovale: Secondary | ICD-10-CM

## 2021-03-26 DIAGNOSIS — E876 Hypokalemia: Secondary | ICD-10-CM | POA: Diagnosis present

## 2021-03-26 DIAGNOSIS — N898 Other specified noninflammatory disorders of vagina: Secondary | ICD-10-CM

## 2021-03-26 DIAGNOSIS — Z823 Family history of stroke: Secondary | ICD-10-CM | POA: Diagnosis not present

## 2021-03-26 DIAGNOSIS — F17210 Nicotine dependence, cigarettes, uncomplicated: Secondary | ICD-10-CM | POA: Diagnosis present

## 2021-03-26 DIAGNOSIS — Z791 Long term (current) use of non-steroidal anti-inflammatories (NSAID): Secondary | ICD-10-CM | POA: Diagnosis not present

## 2021-03-26 DIAGNOSIS — Z6841 Body Mass Index (BMI) 40.0 and over, adult: Secondary | ICD-10-CM

## 2021-03-26 DIAGNOSIS — Z7982 Long term (current) use of aspirin: Secondary | ICD-10-CM | POA: Diagnosis not present

## 2021-03-26 DIAGNOSIS — Z79899 Other long term (current) drug therapy: Secondary | ICD-10-CM

## 2021-03-26 DIAGNOSIS — K859 Acute pancreatitis without necrosis or infection, unspecified: Secondary | ICD-10-CM

## 2021-03-26 DIAGNOSIS — D509 Iron deficiency anemia, unspecified: Secondary | ICD-10-CM | POA: Diagnosis present

## 2021-03-26 DIAGNOSIS — R109 Unspecified abdominal pain: Secondary | ICD-10-CM | POA: Diagnosis not present

## 2021-03-26 DIAGNOSIS — B179 Acute viral hepatitis, unspecified: Secondary | ICD-10-CM | POA: Diagnosis not present

## 2021-03-26 DIAGNOSIS — F840 Autistic disorder: Secondary | ICD-10-CM | POA: Diagnosis present

## 2021-03-26 DIAGNOSIS — Z20822 Contact with and (suspected) exposure to covid-19: Secondary | ICD-10-CM | POA: Diagnosis present

## 2021-03-26 LAB — URINALYSIS, COMPLETE (UACMP) WITH MICROSCOPIC
Bacteria, UA: NONE SEEN
Glucose, UA: NEGATIVE mg/dL
Hgb urine dipstick: NEGATIVE
Ketones, ur: 5 mg/dL — AB
Leukocytes,Ua: NEGATIVE
Nitrite: NEGATIVE
Protein, ur: 30 mg/dL — AB
Specific Gravity, Urine: 1.025 (ref 1.005–1.030)
pH: 5 (ref 5.0–8.0)

## 2021-03-26 LAB — COMPREHENSIVE METABOLIC PANEL
ALT: 562 U/L — ABNORMAL HIGH (ref 0–44)
AST: 356 U/L — ABNORMAL HIGH (ref 15–41)
Albumin: 4 g/dL (ref 3.5–5.0)
Alkaline Phosphatase: 281 U/L — ABNORMAL HIGH (ref 38–126)
Anion gap: 8 (ref 5–15)
BUN: 10 mg/dL (ref 6–20)
CO2: 23 mmol/L (ref 22–32)
Calcium: 9.2 mg/dL (ref 8.9–10.3)
Chloride: 106 mmol/L (ref 98–111)
Creatinine, Ser: 0.67 mg/dL (ref 0.44–1.00)
GFR, Estimated: >60 mL/min (ref >60)
Glucose, Bld: 107 mg/dL — ABNORMAL HIGH (ref 70–99)
Potassium: 3 mmol/L — ABNORMAL LOW (ref 3.5–5.1)
Sodium: 137 mmol/L (ref 135–145)
Total Bilirubin: 4.1 mg/dL — ABNORMAL HIGH (ref 0.3–1.2)
Total Protein: 7.9 g/dL (ref 6.5–8.1)

## 2021-03-26 LAB — CBC WITH DIFFERENTIAL/PLATELET
Abs Immature Granulocytes: 0.04 10*3/uL (ref 0.00–0.07)
Basophils Absolute: 0 10*3/uL (ref 0.0–0.1)
Basophils Relative: 0 %
Eosinophils Absolute: 0 10*3/uL (ref 0.0–0.5)
Eosinophils Relative: 0 %
HCT: 42.7 % (ref 36.0–46.0)
Hemoglobin: 13.6 g/dL (ref 12.0–15.0)
Immature Granulocytes: 0 %
Lymphocytes Relative: 13 %
Lymphs Abs: 1.5 10*3/uL (ref 0.7–4.0)
MCH: 25.4 pg — ABNORMAL LOW (ref 26.0–34.0)
MCHC: 31.9 g/dL (ref 30.0–36.0)
MCV: 79.8 fL — ABNORMAL LOW (ref 80.0–100.0)
Monocytes Absolute: 0.6 10*3/uL (ref 0.1–1.0)
Monocytes Relative: 5 %
Neutro Abs: 9.3 10*3/uL — ABNORMAL HIGH (ref 1.7–7.7)
Neutrophils Relative %: 82 %
Platelets: 351 10*3/uL (ref 150–400)
RBC: 5.35 MIL/uL — ABNORMAL HIGH (ref 3.87–5.11)
RDW: 16.1 % — ABNORMAL HIGH (ref 11.5–15.5)
WBC: 11.5 10*3/uL — ABNORMAL HIGH (ref 4.0–10.5)
nRBC: 0 % (ref 0.0–0.2)

## 2021-03-26 LAB — LACTIC ACID, PLASMA
Lactic Acid, Venous: 1 mmol/L (ref 0.5–1.9)
Lactic Acid, Venous: 0.7 mmol/L (ref 0.5–1.9)

## 2021-03-26 LAB — WET PREP, GENITAL
Clue Cells Wet Prep HPF POC: NONE SEEN
Trich, Wet Prep: NONE SEEN
WBC, Wet Prep HPF POC: <10 (ref <10)
Yeast Wet Prep HPF POC: NONE SEEN

## 2021-03-26 LAB — ETHANOL: Alcohol, Ethyl (B): <10 mg/dL (ref <10)

## 2021-03-26 LAB — RESP PANEL BY RT-PCR (FLU A&B, COVID) ARPGX2
Influenza A by PCR: NEGATIVE
Influenza B by PCR: NEGATIVE
SARS Coronavirus 2 by RT PCR: NEGATIVE

## 2021-03-26 LAB — LIPID PANEL
Cholesterol: 166 mg/dL (ref 0–200)
HDL: 50 mg/dL (ref >40)
LDL Cholesterol: 101 mg/dL — ABNORMAL HIGH (ref 0–99)
Total CHOL/HDL Ratio: 3.3 RATIO
Triglycerides: 75 mg/dL (ref <150)
VLDL: 15 mg/dL (ref 0–40)

## 2021-03-26 LAB — LIPASE, BLOOD: Lipase: 359 U/L — ABNORMAL HIGH (ref 11–51)

## 2021-03-26 LAB — LACTATE DEHYDROGENASE: LDH: 292 U/L — ABNORMAL HIGH (ref 98–192)

## 2021-03-26 LAB — CHLAMYDIA/NGC RT PCR (ARMC ONLY)
Chlamydia Tr: NOT DETECTED
N gonorrhoeae: NOT DETECTED

## 2021-03-26 LAB — POC URINE PREG, ED: Preg Test, Ur: NEGATIVE

## 2021-03-26 LAB — PROCALCITONIN: Procalcitonin: <0.1 ng/mL

## 2021-03-26 MED ORDER — ONDANSETRON HCL 4 MG/2ML IJ SOLN
4.0000 mg | Freq: Four times a day (QID) | INTRAMUSCULAR | Status: DC | PRN
  Administered 2021-03-27: 06:00:00 4 mg via INTRAVENOUS
  Filled 2021-03-26: qty 2

## 2021-03-26 MED ORDER — MORPHINE SULFATE (PF) 2 MG/ML IV SOLN
2.0000 mg | INTRAVENOUS | Status: DC | PRN
  Administered 2021-03-26 – 2021-03-27 (×2): 2 mg via INTRAVENOUS
  Filled 2021-03-26 (×2): qty 1

## 2021-03-26 MED ORDER — ONDANSETRON HCL 4 MG PO TABS: 4.0000 mg | ORAL_TABLET | Freq: Three times a day (TID) | ORAL | Status: DC | PRN

## 2021-03-26 MED ORDER — GADOBUTROL 1 MMOL/ML IV SOLN
10.0000 mL | Freq: Once | INTRAVENOUS | Status: AC | PRN
  Administered 2021-03-26: 21:00:00 10 mL via INTRAVENOUS
  Filled 2021-03-26: qty 10

## 2021-03-26 MED ORDER — ENOXAPARIN SODIUM 80 MG/0.8ML IJ SOSY
0.5000 mg/kg | PREFILLED_SYRINGE | INTRAMUSCULAR | Status: DC
  Filled 2021-03-26 (×5): qty 0.63

## 2021-03-26 MED ORDER — NIFEDIPINE ER OSMOTIC RELEASE 30 MG PO TB24: 90.0000 mg | ORAL_TABLET | Freq: Every day | ORAL | Status: DC

## 2021-03-26 MED ORDER — HYDROMORPHONE HCL 1 MG/ML IJ SOLN
1.0000 mg | Freq: Once | INTRAMUSCULAR | Status: AC
  Administered 2021-03-26: 18:00:00 1 mg via INTRAVENOUS
  Filled 2021-03-26: qty 1

## 2021-03-26 MED ORDER — ONDANSETRON HCL 4 MG PO TABS: 4.0000 mg | ORAL_TABLET | Freq: Four times a day (QID) | ORAL | Status: DC | PRN

## 2021-03-26 MED ORDER — ENOXAPARIN SODIUM 40 MG/0.4ML IJ SOSY: 40.0000 mg | PREFILLED_SYRINGE | INTRAMUSCULAR | Status: DC

## 2021-03-26 MED ORDER — IOHEXOL 300 MG/ML  SOLN
100.0000 mL | Freq: Once | INTRAMUSCULAR | Status: AC | PRN
  Administered 2021-03-26: 18:00:00 100 mL via INTRAVENOUS
  Filled 2021-03-26: qty 100

## 2021-03-26 MED ORDER — POTASSIUM CHLORIDE IN NACL 20-0.9 MEQ/L-% IV SOLN
INTRAVENOUS | Status: DC
  Filled 2021-03-26 (×10): qty 1000

## 2021-03-26 MED ORDER — POTASSIUM CHLORIDE CRYS ER 20 MEQ PO TBCR
40.0000 meq | EXTENDED_RELEASE_TABLET | Freq: Once | ORAL | Status: AC
  Administered 2021-03-26: 19:00:00 40 meq via ORAL
  Filled 2021-03-26: qty 2

## 2021-03-26 MED ORDER — ONDANSETRON HCL 4 MG/2ML IJ SOLN
4.0000 mg | Freq: Once | INTRAMUSCULAR | Status: AC
  Administered 2021-03-26: 18:00:00 4 mg via INTRAVENOUS
  Filled 2021-03-26: qty 2

## 2021-03-26 MED ORDER — MAGNESIUM HYDROXIDE 400 MG/5ML PO SUSP: 30.0000 mL | Freq: Every day | ORAL | Status: DC | PRN

## 2021-03-26 MED ORDER — ACETAMINOPHEN 650 MG RE SUPP
650.0000 mg | Freq: Four times a day (QID) | RECTAL | Status: DC | PRN
  Filled 2021-03-26: qty 1

## 2021-03-26 MED ORDER — HYDROMORPHONE HCL 1 MG/ML IJ SOLN
0.5000 mg | Freq: Once | INTRAMUSCULAR | Status: AC
  Administered 2021-03-26: 20:00:00 0.5 mg via INTRAVENOUS
  Filled 2021-03-26: qty 1

## 2021-03-26 MED ORDER — ACETAMINOPHEN 325 MG PO TABS: 650.0000 mg | ORAL_TABLET | Freq: Four times a day (QID) | ORAL | Status: DC | PRN

## 2021-03-26 MED ORDER — PANTOPRAZOLE SODIUM 40 MG IV SOLR
40.0000 mg | Freq: Once | INTRAVENOUS | Status: AC
  Administered 2021-03-26: 19:00:00 40 mg via INTRAVENOUS
  Filled 2021-03-26: qty 40

## 2021-03-26 MED ORDER — LACTATED RINGERS IV BOLUS
1000.0000 mL | Freq: Once | INTRAVENOUS | Status: AC
  Administered 2021-03-26: 18:00:00 1000 mL via INTRAVENOUS

## 2021-03-26 MED ORDER — LORAZEPAM 2 MG/ML IJ SOLN
1.0000 mg | Freq: Once | INTRAMUSCULAR | Status: AC
  Administered 2021-03-26: 20:00:00 1 mg via INTRAVENOUS
  Filled 2021-03-26: qty 1

## 2021-03-26 MED ORDER — TRAZODONE HCL 50 MG PO TABS
25.0000 mg | ORAL_TABLET | Freq: Every evening | ORAL | Status: DC | PRN
  Administered 2021-03-29: 21:00:00 25 mg via ORAL
  Filled 2021-03-26: qty 1

## 2021-03-26 MED ORDER — DEXTROSE-NACL 5-0.45 % IV SOLN: INTRAVENOUS | Status: AC

## 2021-03-26 MED ORDER — AMLODIPINE BESYLATE 5 MG PO TABS
5.0000 mg | ORAL_TABLET | Freq: Every day | ORAL | Status: DC
  Administered 2021-03-26 – 2021-03-30 (×5): 5 mg via ORAL
  Filled 2021-03-26 (×5): qty 1

## 2021-03-26 NOTE — ED Triage Notes (Signed)
Pt reports pain to her luq since Friday. Pt states was dx'd with pancreatitis months ago and states it feels the same. Pt reports was here last pm but left because the wait was long. Pt reports also having some vaginal discharge and wants to be checked for a STD

## 2021-03-26 NOTE — ED Notes (Signed)
Pt endorsing abdominal pain. Md messaged

## 2021-03-26 NOTE — H&P (Addendum)
[place]   PATIENT NAME: Ashley Howell    MR#:  [id number]  DATE OF BIRTH:  11/05/1990  DATE OF ADMISSION:  03/26/2021  PRIMARY CARE PHYSICIAN: [facility] [place]   Patient is coming from: Home  REQUESTING/REFERRING PHYSICIAN: Duffy Bruce, MD  CHIEF COMPLAINT:   Chief Complaint  Patient presents with   Flank Pain   Abdominal Pain    HISTORY OF PRESENT ILLNESS:  Ashley Howell is a 30 y.o. African-American female with medical history significant for hypertension and ongoing tobacco abuse, who presented to the ER with acute onset of upper abdominal pain and substernal chest pain that started yesterday with associated vomiting without bilious vomitus or hematemesis.  She denies any fever or chills.  She came to the ER yesterday and was in significant pain and could not wait.  She has been having almost no p.o. intake since yesterday.  When she came to the ER her lipase level was 8036 yesterday and it was down to 359 today.  Her total bili was 3.4 and came up to 4.1 and alk phos 194 and came up to 281 on her AST and ALT decreased.  She denies any diarrhea or constipation, melena or bright red blood per rectum.  No dyspnea or cough or wheezing.  She did not notice jaundice.  No dysuria, oliguria or hematuria or flank pain.  ED Course: When she came to the ER, blood pressure was 146/104 with a heart rate of 107 with otherwise normal vital signs.  Labs revealed hypokalemia of 3 and alk phos of 281 compared to 194 lipase of 359 compared to 8036, AST of 356 down from 1163 and ALT 562 down from 699 with a total bili of 4.1 up from 3.4 yesterday.  LDH was 292.  Lipid profile showed triglycerides of 101.  Lactic acid was 1 and later 0.7 and procalcitonin less than 0.1.  Her CBC showed leukocytosis of 11.5 compared to 9.7 yesterday and microcytosis.  Urine pregnancy test was negative.  Wet prep and GC and chlamydia came back negative. EKG as reviewed by me : EKG  showed sinus tachycardia with a rate of 114.  Imaging: Abdominal pelvic CT scan revealed the following: 1. Findings consistent with acute pancreatitis. No evidence of pancreatic necrosis or focal peripancreatic fluid collection. 2. Moderate free fluid in the pelvis, likely reactive. 3. Mildly enlarged peripancreatic lymph nodes, likely reactive.  Right abdomen ultrasound revealed: Cholelithiasis without evidence for cholecystitis.  The patient then had an MRCP after  General surgery phone consultation and it revealed the following:  1. Findings of acute pancreatitis without evidence of peripancreatic hemorrhage, pancreatic mass or necrosis or pancreatic ductal dilatation. There were similar findings on 12/18/2020 but today the peripancreatic edema is more diffuse and there is mild but nonlocalizing increased reactive fluid including around the liver, spleen and small amounts in the paracolic gutters. 2. Cholelithiasis and mild gallbladder distention, without evidence of wall thickening or inflammatory change. 3. Allowing for motion no extrahepatic ductal filling defect is seen with borderline prominent CBD unchanged and no intrahepatic biliary prominence. 4. Mildly prominent likely reactive peripancreatic lymph nodes. 5. Motion limited exam.  The patient was given Dilaudid 0.5 mg and later 1 mg IV, 1 L bolus of IV lactated Ringer, 4 mg of IV Zofran, 40 mg of IV Protonix, 40 mill Cabbell p.o. potassium chloride and D5 half-normal saline at 125 mill per hour.  She will be admitted to a medical telemetry  bed for further evaluation and management. PAST MEDICAL HISTORY:   Past Medical History:  Diagnosis Date   Anxiety    ASD (atrial septal defect)    Benign essential hypertension, antepartum 05/19/2018   160/99 at anatomy scan 18 weeks   Pt sent to ER on 05/15/2018 resolved with rest   Elevated at u/s visit - labetalol 100 mg bid initiated   Hx of cardiomegaly    Hypertension    PFO  (patent foramen ovale)    a. 12/2014 Echo: EF 55-60%, mildly dil LA. PFO w/o significant shunting (reviewed by Dr. Rockey Situ 07/2018).   Shortness of breath dyspnea    Spinal headache     PAST SURGICAL HISTORY:   Past Surgical History:  Procedure Laterality Date   CESAREAN SECTION  10/28/2008   CESAREAN SECTION N/A 02/22/2015   Procedure: CESAREAN SECTION;  Surgeon: Gae Dry, MD;  Location: [facility] ORS;  Service: Obstetrics;  Laterality: N/A;   CESAREAN SECTION  04/13/2010   CESAREAN SECTION N/A 10/09/2018   Procedure: CESAREAN SECTION;  Surgeon: Homero Fellers, MD;  Location: [facility] ORS;  Service: Obstetrics;  Laterality: N/A;   CESAREAN SECTION WITH BILATERAL TUBAL LIGATION Bilateral 10/06/2020   Procedure: CESAREAN SECTION WITH BILATERAL TUBAL LIGATION;  Surgeon: Homero Fellers, MD;  Location: [facility] ORS;  Service: Obstetrics;  Laterality: Bilateral;    SOCIAL HISTORY:   Social History   Tobacco Use   Smoking status: Some Days    Packs/day: 0.25    Years: 2.00    Pack years: 0.50    Types: Cigarettes   Smokeless tobacco: Never  Substance Use Topics   Alcohol use: No    FAMILY HISTORY:   Family History  Problem Relation Age of Onset   Hypertension Mother    Stroke Paternal Aunt    Cancer Maternal Grandfather        Colon    DRUG ALLERGIES:  No Known Allergies  REVIEW OF SYSTEMS:   ROS As per history of present illness. All pertinent systems were reviewed above. Constitutional, HEENT, cardiovascular, respiratory, GI, GU, musculoskeletal, neuro, psychiatric, endocrine, integumentary and hematologic systems were reviewed and are otherwise negative/unremarkable except for positive findings mentioned above in the HPI.   MEDICATIONS AT HOME:   Prior to Admission medications   Medication Sig Start Date End Date Taking? Authorizing Provider  aspirin 81 MG EC tablet Chew 81 mg by mouth daily.    [provider]  ferrous sulfate 325 (65 FE) MG tablet  Take 1 tablet (325 mg total) by mouth 2 (two) times daily with a meal. 10/08/20   Will Bonnet, MD  ibuprofen (ADVIL) 600 MG tablet Take 1 tablet (600 mg total) by mouth every 6 (six) hours. 10/08/20   Will Bonnet, MD  meloxicam (MOBIC) 15 MG tablet Take 1 tablet (15 mg total) by mouth daily. 12/04/20   Cuthriell, Charline Bills, PA-C  metroNIDAZOLE (FLAGYL) 500 MG tablet TAKE 2 TABLETS BY MOUTH TWICE DAILY FOR  1  DAY,  OR  IF  YOU  CAN  TOLERATE,  TAKE  ALL  4  TABS  AS  A  SINGLE  DOSE 12/13/20   Schuman, Christanna R, MD  metroNIDAZOLE (METROGEL) 0.75 % vaginal gel Place 1 Applicatorful vaginally at bedtime. Apply one applicatorful to vagina at bedtime for 5 days 11/24/20   Homero Fellers, MD  NIFEdipine (PROCARDIA-XL/NIFEDICAL-XL) 30 MG 24 hr tablet Take 3 tablets (90 mg total) by mouth daily. 08/24/20  Schuman, Christanna R, MD  ondansetron (ZOFRAN) 4 MG tablet Take 1 tablet (4 mg total) by mouth every 8 (eight) hours as needed for up to 10 doses for nausea or vomiting. 12/18/20   Lucrezia Starch, MD      VITAL SIGNS:  Blood pressure (!) 146/104, pulse (!) 107, temperature 97.9 F (36.6 C), temperature source Oral, resp. rate 20, height '5\' 7"'  (1.702 m), weight 127 kg, last menstrual period 03/08/2021, SpO2 97 %, unknown if currently breastfeeding.  PHYSICAL EXAMINATION:  Physical Exam  GENERAL:  30 y.o.-year-old African-American female patient lying in the bed with no acute distress.  EYES: Pupils equal, round, reactive to light and accommodation. No scleral icterus. Extraocular muscles intact.  HEENT: Head atraumatic, normocephalic. Oropharynx and nasopharynx clear.  NECK:  Supple, no jugular venous distention. No thyroid enlargement, no tenderness.  LUNGS: Normal breath sounds bilaterally, no wheezing, rales,rhonchi or crepitation. No use of accessory muscles of respiration.  CARDIOVASCULAR: Regular rate and rhythm, S1, S2 normal. No murmurs, rubs, or gallops.  ABDOMEN: Soft,  nondistended, with epigastric , Left upper quadrant and right upper quadrant tenderness without rebound tenderness guarding or rigidity.  Bowel sounds present. No organomegaly or mass.  EXTREMITIES: No pedal edema, cyanosis, or clubbing.  NEUROLOGIC: Cranial nerves II through XII are intact. Muscle strength 5/5 in all extremities. Sensation intact. Gait not checked.  PSYCHIATRIC: The patient is alert and oriented x 3.  Normal affect and good eye contact. SKIN: No obvious rash, lesion, or ulcer.   LABORATORY PANEL:   CBC Recent Labs  Lab 03/26/21 1615  WBC 11.5*  HGB 13.6  HCT 42.7  PLT 351   ------------------------------------------------------------------------------------------------------------------  Chemistries  Recent Labs  Lab 03/26/21 1615  NA 137  K 3.0*  CL 106  CO2 23  GLUCOSE 107*  BUN 10  CREATININE 0.67  CALCIUM 9.2  AST 356*  ALT 562*  ALKPHOS 281*  BILITOT 4.1*   ------------------------------------------------------------------------------------------------------------------  Cardiac Enzymes No results for input(s): TROPONINI in the last 168 hours. ------------------------------------------------------------------------------------------------------------------  RADIOLOGY:  CT ABDOMEN PELVIS W CONTRAST  Result Date: 03/26/2021 CLINICAL DATA:  Left upper quadrant pain, diagnosis pancreatitis several months ago and patient states feels similar. EXAM: CT ABDOMEN AND PELVIS WITH CONTRAST TECHNIQUE: Multidetector CT imaging of the abdomen and pelvis was performed using the standard protocol following bolus administration of intravenous contrast. CONTRAST:  175m OMNIPAQUE IOHEXOL 300 MG/ML  SOLN COMPARISON:  MRI abdomen 12/18/2020 FINDINGS: Lower chest: No acute abnormality. Hepatobiliary: No focal liver abnormality is seen. Probable sludge in the gallbladder. No gallbladder wall thickening. No intra or extrahepatic biliary duct dilation. Pancreas: The  pancreas appears edematous with marked peripancreatic fat stranding. There is no hypoattenuation to suggest necrosis. No central duct dilation. No focal peripancreatic fluid collection. Spleen: Normal in size without focal abnormality. Adrenals/Urinary Tract: Adrenal glands are unremarkable. Kidneys are normal, without renal calculi, focal lesion, or hydronephrosis. Bladder is unremarkable. Stomach/Bowel: Stomach is within normal limits. Appendix appears normal. No evidence of bowel wall thickening, distention, or inflammatory changes. Vascular/Lymphatic: No significant vascular findings are present. Mildly enlarged peripancreatic lymph nodes, likely reactive for example series 2, image 22 measuring 1.1 cm in short axis. Reproductive: Uterus and bilateral adnexa are unremarkable. Other: Moderate free fluid in the pelvis, likely reactive. No abdominal wall hernia. Musculoskeletal: No acute or significant osseous findings. IMPRESSION: 1. Findings consistent with acute pancreatitis. No evidence of pancreatic necrosis or focal peripancreatic fluid collection. 2. Moderate free fluid in the pelvis, likely reactive.  3. Mildly enlarged peripancreatic lymph nodes, likely reactive. Electronically Signed   By: Audie Pinto M.D.   On: 03/26/2021 18:14   US ABDOMEN LIMITED RUQ (LIVER/GB)  Result Date: 03/26/2021 CLINICAL DATA:  Bilirubinemia EXAM: ULTRASOUND ABDOMEN LIMITED RIGHT UPPER QUADRANT COMPARISON:  Abdominal ultrasound 12/18/2020 there are multiple gallstones FINDINGS: Gallbladder: There are multiple gallstones measuring up to 1.0 cm. No gallbladder wall thickening. Negative sonographic Murphy sign. Common bile duct: Diameter: 0.5 cm, within normal limits Liver: No focal lesion identified. Within normal limits in parenchymal echogenicity. Portal vein is patent on color Doppler imaging with normal direction of blood flow towards the liver. Other: None. IMPRESSION: Cholelithiasis without evidence of acute  cholecystitis. Otherwise unremarkable sonographic exam of the right upper quadrant. Electronically Signed   By: Audie Pinto M.D.   On: 03/26/2021 19:23      IMPRESSION AND PLAN:   1.  Acute gallstone pancreatitis with associated hepatitis obstructive jaundice. - The patient will be admitted to a medical telemetry bed. - Pain management to be provided. - Should be kept NPO. - We will follow serial lipase levels. - General surgery consult will be obtained.  Dr. Derryl Harbor was notified. - Gastroenterology consult to be obtained.  Dr. Virgina Jock was notified.    2.  Hypertensive urgency. - Pain management will be provided. - We will continue her amlodipine. - She will be placed on as needed IV labetalol.  3.  Hypokalemia. - We will replace her potassium and check magnesium level.  4.  Ongoing tobacco abuse. - She was counseled for smoking cessation and will receive further counseling here.   DVT prophylaxis: Lovenox. Code Status: full code. Family Communication:  The plan of care was discussed in details with the patient (and family). I answered all questions. The patient agreed to proceed with the above mentioned plan. Further management will depend upon hospital course. Disposition Plan: Back to previous home environment Consults called: Gastroenterology and general surgery all the records are reviewed and case discussed with ED provider.  Status is: Inpatient    Remains inpatient appropriate because:Ongoing diagnostic testing needed not appropriate for outpatient work up, Unsafe d/c plan, IV treatments appropriate due to intensity of illness or inability to take PO, and Inpatient level of care appropriate due to severity of illness   Dispo: The patient is from: Home              Anticipated d/c is to: Home              Patient currently is not medically stable to d/c.              Difficult to place patient: No     Christel Mormon M.D on 03/26/2021 at 8:43 PM  [facility]   From 7 PM-7 AM, contact night-coverage [email or web address]  CC: Primary care physician; [facility]

## 2021-03-26 NOTE — ED Provider Notes (Signed)
[facility] Emergency Department Provider Note  ____________________________________________   Event Date/Time   First MD Initiated Contact with Patient 03/26/21 1702     (approximate)  I have reviewed the triage vital signs and the nursing notes.   HISTORY  Chief Complaint Flank Pain and Abdominal Pain   HPI Ashley Howell is a 30 y.o. female with past medical history of anxiety, ASD, HTN, PFO, pressure and previous episodes of pancreatitis in September this year who presents for assessment of 3 days of nausea and vomiting as well as abdominal pain.  Patient states her abdominal pain feels primarily in the epigastrium and left upper quadrants.  She also states that today she developed some yellow vaginal discharge and wished to be tested for STDs.  She denies any new lower abdominal pain, back pain, fevers, cough, chest pain, headache, earache, sore throat, rash, burning with urination or any other clear acute associated sick symptoms.  Adamantly denies any recent EtOH use, significant Tylenol use since she has been taking Aleve for her pain.  Patient did come to emergency room yesterday but left prior to being seen due to long weights.  She states she is returning today due to persistent pain and nausea although her nausea is gotten little better since she came to the emergency room.         Past Medical History:  Diagnosis Date   Anxiety    ASD (atrial septal defect)    Benign essential hypertension, antepartum 05/19/2018   160/99 at anatomy scan 18 weeks   Pt sent to ER on 05/15/2018 resolved with rest   Elevated at u/s visit - labetalol 100 mg bid initiated   Hx of cardiomegaly    Hypertension    PFO (patent foramen ovale)    a. 12/2014 Echo: EF 55-60%, mildly dil LA. PFO w/o significant shunting (reviewed by Dr. Rockey Situ 07/2018).   Shortness of breath dyspnea    Spinal headache     Patient Active Problem List   Diagnosis Date Noted   History of  cesarean section 10/06/2020   Encounter for sterilization    [redacted] weeks gestation of pregnancy    Poor fetal growth affecting management of mother in third trimester 09/08/2020   Gestational hypertension 08/23/2020   Pregnancy affected by fetal growth restriction 07/13/2020   Decreased fetal movement affecting management of pregnancy in second trimester 07/03/2020   Supervision of high-risk pregnancy, third trimester 03/16/2020   ASD (atrial septal defect) 03/16/2020   Bacterial vaginosis 06/25/2019   Dysplasia of cervix, low grade (CIN 1) 12/11/2018   Previous cesarean delivery, antepartum 10/09/2018   BMI 40.0-44.9, adult ([place]) 09/04/2018   Chronic hypertension 05/19/2018   PFO (patent foramen ovale) 02/18/2015   Obesity in pregnancy, antepartum 02/15/2015   Previous cesarean delivery, antepartum condition or complication 91/63/8466   History of cardiomegaly 10/28/2014    Past Surgical History:  Procedure Laterality Date   CESAREAN SECTION  10/28/2008   CESAREAN SECTION N/A 02/22/2015   Procedure: CESAREAN SECTION;  Surgeon: Gae Dry, MD;  Location: [facility] ORS;  Service: Obstetrics;  Laterality: N/A;   CESAREAN SECTION  04/13/2010   CESAREAN SECTION N/A 10/09/2018   Procedure: CESAREAN SECTION;  Surgeon: Homero Fellers, MD;  Location: [facility] ORS;  Service: Obstetrics;  Laterality: N/A;   CESAREAN SECTION WITH BILATERAL TUBAL LIGATION Bilateral 10/06/2020   Procedure: CESAREAN SECTION WITH BILATERAL TUBAL LIGATION;  Surgeon: Homero Fellers, MD;  Location: [facility] ORS;  Service: Obstetrics;  Laterality: Bilateral;    Prior to Admission medications   Medication Sig Start Date End Date Taking? Authorizing Provider  aspirin 81 MG EC tablet Chew 81 mg by mouth daily.    [provider]  ferrous sulfate 325 (65 FE) MG tablet Take 1 tablet (325 mg total) by mouth 2 (two) times daily with a meal. 10/08/20   Will Bonnet, MD  ibuprofen (ADVIL) 600 MG tablet Take 1  tablet (600 mg total) by mouth every 6 (six) hours. 10/08/20   Will Bonnet, MD  meloxicam (MOBIC) 15 MG tablet Take 1 tablet (15 mg total) by mouth daily. 12/04/20   Cuthriell, Charline Bills, PA-C  metroNIDAZOLE (FLAGYL) 500 MG tablet TAKE 2 TABLETS BY MOUTH TWICE DAILY FOR  1  DAY,  OR  IF  YOU  CAN  TOLERATE,  TAKE  ALL  4  TABS  AS  A  SINGLE  DOSE 12/13/20   Schuman, Christanna R, MD  metroNIDAZOLE (METROGEL) 0.75 % vaginal gel Place 1 Applicatorful vaginally at bedtime. Apply one applicatorful to vagina at bedtime for 5 days 11/24/20   Homero Fellers, MD  NIFEdipine (PROCARDIA-XL/NIFEDICAL-XL) 30 MG 24 hr tablet Take 3 tablets (90 mg total) by mouth daily. 08/24/20   Schuman, Stefanie Libel, MD  ondansetron (ZOFRAN) 4 MG tablet Take 1 tablet (4 mg total) by mouth every 8 (eight) hours as needed for up to 10 doses for nausea or vomiting. 12/18/20   Lucrezia Starch, MD    Allergies Patient has no known allergies.  Family History  Problem Relation Age of Onset   Hypertension Mother    Stroke Paternal Aunt    Cancer Maternal Grandfather        Colon    Social History Social History   Tobacco Use   Smoking status: Some Days    Packs/day: 0.25    Years: 2.00    Pack years: 0.50    Types: Cigarettes   Smokeless tobacco: Never  Vaping Use   Vaping Use: Never used  Substance Use Topics   Alcohol use: No   Drug use: No    Review of Systems  Review of Systems  Constitutional:  Negative for chills and fever.  HENT:  Negative for sore throat.   Eyes:  Negative for pain.  Respiratory:  Negative for cough and stridor.   Cardiovascular:  Negative for chest pain.  Gastrointestinal:  Positive for abdominal pain, nausea and vomiting.  Genitourinary:  Negative for dysuria.  Musculoskeletal:  Negative for myalgias.  Skin:  Negative for rash.  Neurological:  Negative for seizures, loss of consciousness and headaches.  Psychiatric/Behavioral:  Negative for suicidal ideas.   All  other systems reviewed and are negative.    ____________________________________________   PHYSICAL EXAM:  VITAL SIGNS: ED Triage Vitals  Enc Vitals Group     BP 03/26/21 1614 (!) 146/104     Pulse Rate 03/26/21 1614 (!) 107     Resp 03/26/21 1614 20     Temp 03/26/21 1614 97.9 F (36.6 C)     Temp Source 03/26/21 1614 Oral     SpO2 03/26/21 1614 97 %     Weight 03/26/21 1525 280 lb (127 kg)     Height 03/26/21 1525 '5\' 7"'  (1.702 m)     Head Circumference --      Peak Flow --      Pain Score 03/26/21 1525 10     Pain Loc --  Pain Edu? --      Excl. in [place]? --    Vitals:   03/26/21 1614  BP: (!) 146/104  Pulse: (!) 107  Resp: 20  Temp: 97.9 F (36.6 C)  SpO2: 97%   Physical Exam Vitals and nursing note reviewed. Exam conducted with a chaperone present.  Constitutional:      General: She is not in acute distress.    Appearance: She is well-developed.  HENT:     Head: Normocephalic and atraumatic.     Right Ear: External ear normal.     Left Ear: External ear normal.     Nose: Nose normal.  Eyes:     Conjunctiva/sclera: Conjunctivae normal.  Cardiovascular:     Rate and Rhythm: Regular rhythm. Tachycardia present.     Heart sounds: No murmur heard. Pulmonary:     Effort: Pulmonary effort is normal. No respiratory distress.     Breath sounds: Normal breath sounds.  Abdominal:     Palpations: Abdomen is soft.     Tenderness: There is generalized abdominal tenderness.  Musculoskeletal:        General: No swelling.     Cervical back: Neck supple.  Skin:    General: Skin is warm and dry.     Capillary Refill: Capillary refill takes less than 2 seconds.  Neurological:     Mental Status: She is alert.  Psychiatric:        Mood and Affect: Mood normal.    Pelvic exam shows closed cervical os with some scant white-yellow discharge.  There is no erythema, bleeding, friability or other abnormalities. ____________________________________________   LABS (all  labs ordered are listed, but only abnormal results are displayed)  Labs Reviewed  CBC WITH DIFFERENTIAL/PLATELET - Abnormal; Notable for the following components:      Result Value   WBC 11.5 (*)    RBC 5.35 (*)    MCV 79.8 (*)    MCH 25.4 (*)    RDW 16.1 (*)    Neutro Abs 9.3 (*)    All other components within normal limits  COMPREHENSIVE METABOLIC PANEL - Abnormal; Notable for the following components:   Potassium 3.0 (*)    Glucose, Bld 107 (*)    AST 356 (*)    ALT 562 (*)    Alkaline Phosphatase 281 (*)    Total Bilirubin 4.1 (*)    All other components within normal limits  LIPASE, BLOOD - Abnormal; Notable for the following components:   Lipase 359 (*)    All other components within normal limits  LACTATE DEHYDROGENASE - Abnormal; Notable for the following components:   LDH 292 (*)    All other components within normal limits  URINALYSIS, COMPLETE (UACMP) WITH MICROSCOPIC - Abnormal; Notable for the following components:   Color, Urine AMBER (*)    APPearance CLEAR (*)    Bilirubin Urine MODERATE (*)    Ketones, ur 5 (*)    Protein, ur 30 (*)    All other components within normal limits  LIPID PANEL - Abnormal; Notable for the following components:   LDL Cholesterol 101 (*)    All other components within normal limits  CHLAMYDIA/NGC RT PCR ([facility] ONLY)            WET PREP, GENITAL  RESP PANEL BY RT-PCR (FLU A&B, COVID) ARPGX2  LACTIC ACID, PLASMA  LACTIC ACID, PLASMA  ETHANOL  PROCALCITONIN  POC URINE PREG, ED   ____________________________________________  EKG \ ____________________________________________  RADIOLOGY  ED MD interpretation:    CT abdomen pelvis shows evidence of acute pancreatitis without evidence of necrosis or peripancreatic fluid collection.  There are some free fluid in the pelvis likely reactive and enlarged peripancreatic lymph nodes.  There is some sludging in the gallbladder seen on CT without evidence of thickening to suggest  cholecystitis.  No intra or extrahepatic biliary duct dilation.  There is no evidence of kidney stones, perinephric stranding, diverticulitis, appendicitis or other acute abdominal pelvic process.  Right upper quadrant ultrasound is remarkable for cholelithiasis without evidence of acute cholecystitis.  Common bile duct is 0.5 cm within normal limits.  Official radiology report(s): CT ABDOMEN PELVIS W CONTRAST  Result Date: 03/26/2021 CLINICAL DATA:  Left upper quadrant pain, diagnosis pancreatitis several months ago and patient states feels similar. EXAM: CT ABDOMEN AND PELVIS WITH CONTRAST TECHNIQUE: Multidetector CT imaging of the abdomen and pelvis was performed using the standard protocol following bolus administration of intravenous contrast. CONTRAST:  165m OMNIPAQUE IOHEXOL 300 MG/ML  SOLN COMPARISON:  MRI abdomen 12/18/2020 FINDINGS: Lower chest: No acute abnormality. Hepatobiliary: No focal liver abnormality is seen. Probable sludge in the gallbladder. No gallbladder wall thickening. No intra or extrahepatic biliary duct dilation. Pancreas: The pancreas appears edematous with marked peripancreatic fat stranding. There is no hypoattenuation to suggest necrosis. No central duct dilation. No focal peripancreatic fluid collection. Spleen: Normal in size without focal abnormality. Adrenals/Urinary Tract: Adrenal glands are unremarkable. Kidneys are normal, without renal calculi, focal lesion, or hydronephrosis. Bladder is unremarkable. Stomach/Bowel: Stomach is within normal limits. Appendix appears normal. No evidence of bowel wall thickening, distention, or inflammatory changes. Vascular/Lymphatic: No significant vascular findings are present. Mildly enlarged peripancreatic lymph nodes, likely reactive for example series 2, image 22 measuring 1.1 cm in short axis. Reproductive: Uterus and bilateral adnexa are unremarkable. Other: Moderate free fluid in the pelvis, likely reactive. No abdominal wall  hernia. Musculoskeletal: No acute or significant osseous findings. IMPRESSION: 1. Findings consistent with acute pancreatitis. No evidence of pancreatic necrosis or focal peripancreatic fluid collection. 2. Moderate free fluid in the pelvis, likely reactive. 3. Mildly enlarged peripancreatic lymph nodes, likely reactive. Electronically Signed   By: NAudie PintoM.D.   On: 03/26/2021 18:14   [place]DOM[place] LIMITED RUQ (LIVER/GB)  Result Date: 03/26/2021 CLINICAL DATA:  Bilirubinemia EXAM: ULTRASOUND ABDOMEN LIMITED RIGHT UPPER QUADRANT COMPARISON:  Abdominal ultrasound 12/18/2020 there are multiple gallstones FINDINGS: Gallbladder: There are multiple gallstones measuring up to 1.0 cm. No gallbladder wall thickening. Negative sonographic Murphy sign. Common bile duct: Diameter: 0.5 cm, within normal limits Liver: No focal lesion identified. Within normal limits in parenchymal echogenicity. Portal vein is patent on color Doppler imaging with normal direction of blood flow towards the liver. Other: None. IMPRESSION: Cholelithiasis without evidence of acute cholecystitis. Otherwise unremarkable sonographic exam of the right upper quadrant. Electronically Signed   By: NAudie PintoM.D.   On: 03/26/2021 19:23    ____________________________________________   PROCEDURES  Procedure(s) performed (including Critical Care):  .1-3 Lead EKG Interpretation Performed by: SLucrezia Starch MD Authorized by: SLucrezia Starch MD     Interpretation: non-specific     ECG rate assessment: tachycardic     Rhythm: sinus tachycardia     Ectopy: none     Conduction: normal     ____________________________________________   INITIAL IMPRESSION / ASSESSMENT AND PLAN / ED COURSE      Patient presents with above-stated history and exam for assessment of ongoing and worsening abdominal pain after  initially seeking assessment yesterday believing before being seen due to long weights.  She had some nausea and  vomiting associated this but the nausea seems of gotten better today.  Symptoms started about 3 days ago.  She states it feels similar to her pancreatitis she had in the past.  On arrival she is tachycardic and hypertensive with otherwise stable vital signs on room air.  Her abdomen is diffusely tender throughout.  He is also worried about possible STD as she noticed some minimal yellow discharge today from her vagina but denies any lower abdominal pain or other urinary or pelvic symptoms.  I was able to review initial triage labs since yesterday.  CMP yesterday remarkable for an AST of 1163, ALT of 699 and alk phos of 193 as well as a T bili of 3.4.  Otherwise no significant electrolyte metabolic derangements noted yesterday.  CBC yesterday showed no leukocytosis or acute anemia.  Lipase yesterday at [place].  Given she is returning with persistent symptoms I am concerned for ongoing acute pancreatitis.  She is denying any associated recent alcohol use given his elevation of bilirubin yesterday and concern for possible gallstone pancreatitis causing component of cholestasis.  This would not also explain the left-sided symptoms she is describing including pain left upper quadrant and left lower quadrant.  She has been taking Aleve as well as possible she is developing gastritis and peptic ulcer.  CMP today shows improving LFTs but rising bilirubin with an AST of 356, ALT of 562 and alk phos of 281 compared to 194 yesterday.  T bili is 4.1 today.  Potassium is 3 but there are no other new electrolyte or metabolic derangements on today's CMP.  BC today shows WBC count of 11.5 without evidence of acute anemia.  Lipase today is 359.  Lactic acid is 1.  Pregnancy test is negative.  UA is not suggestive of cystitis patient has no CVA tenderness or other findings at this time to suggest pyelonephritis.  With regard to abnormal discharge she does not seem to have evidence of PID on exam although GC and wet prep were sent  for possible cervicitis and malignancy on exam..  Wet prep and GC CT studies are unremarkable.  Unclear etiology for the discharge at this time although do not believe antibiotics are currently indicated.  COVID influenza PCR negative.  CT abdomen pelvis shows evidence of acute pancreatitis without evidence of necrosis or peripancreatic fluid collection.  There are some free fluid in the pelvis likely reactive and enlarged peripancreatic lymph nodes.  There is some sludging in the gallbladder seen on CT without evidence of thickening to suggest cholecystitis.  No intra or extrahepatic biliary duct dilation.  There is no evidence of kidney stones, perinephric stranding, diverticulitis, appendicitis or other acute abdominal pelvic process.  Right upper quadrant ultrasound is remarkable for cholelithiasis without evidence of acute cholecystitis.  Common bile duct is 0.5 cm within normal limits.  At this time I have a low suspicion for acute cholecystitis or cholangitis but I am concerned for recurrent pancreatitis caused by gallstone.  While there is no dilation of the common bile duct it is somewhat surprising her bilirubin is rising in the setting of downtrending lipase.  Discussed this with on-call gastroenterologist and general surgeon Dr. Virgina Jock and Dr. Derryl Harbor who shared concern for possible gallstone related pancreatitis and recommended MRCP.  In addition after discussing with general surgery recommendation is for hospitalist admission as patient may be a candidate for cholecystectomy once  her inflammation has subsided after a day or 2.  Discussed that even if patient does have a stone and potentially will need MRCP the next day or so that Dr. Allen Norris will be available to assist with this per Dr. Virgina Jock.  He recommends hospitalist admission regardless of MRCP results.  I will admit to medicine service for further evaluation and management.      ____________________________________________   FINAL  CLINICAL IMPRESSION(S) / ED DIAGNOSES  Final diagnoses:  Bilirubinemia  Acute pancreatitis, unspecified complication status, unspecified pancreatitis type  Vaginal discharge  Hypokalemia    Medications  LORazepam (ATIVAN) injection 1 mg (has no administration in time range)  HYDROmorphone (DILAUDID) injection 0.5 mg (has no administration in time range)  HYDROmorphone (DILAUDID) injection 1 mg (1 mg Intravenous Given 03/26/21 1734)  lactated ringers bolus 1,000 mL (1,000 mLs Intravenous New Bag/Given 03/26/21 1733)  ondansetron (ZOFRAN) injection 4 mg (4 mg Intravenous Given 03/26/21 1735)  iohexol (OMNIPAQUE) 300 MG/ML solution 100 mL (100 mLs Intravenous Contrast Given 03/26/21 1749)  potassium chloride SA (KLOR-CON M) CR tablet 40 mEq (40 mEq Oral Given 03/26/21 1903)  pantoprazole (PROTONIX) injection 40 mg (40 mg Intravenous Given 03/26/21 1905)     ED Discharge Orders     None        Note:  This document was prepared using Dragon voice recognition software and may include unintentional dictation errors.    Lucrezia Starch, MD 03/26/21 2013

## 2021-03-26 NOTE — Progress Notes (Signed)
PHARMACIST - PHYSICIAN COMMUNICATION  CONCERNING:  Enoxaparin (Lovenox) for DVT Prophylaxis    RECOMMENDATION: Patient was prescribed enoxaprin 40mg  q24 hours for VTE prophylaxis.   Filed Weights   03/26/21 1525 03/26/21 1553  Weight: 127 kg (280 lb) 127 kg (279 lb 15.8 oz)    Body mass index is 43.85 kg/m.  Estimated Creatinine Clearance: 142.5 mL/min (by C-G formula based on SCr of 0.67 mg/dL).   Based on [facility] policy patient is candidate for enoxaparin 0.5mg /kg TBW SQ every 24 hours based on BMI being >30.   DESCRIPTION: Pharmacy has adjusted enoxaparin dose per [facility] policy.  Patient is now receiving enoxaparin 62.5 mg every 24 hours    [facility], PharmD Clinical Pharmacist  03/26/2021 8:57 PM

## 2021-03-26 NOTE — Progress Notes (Incomplete)
Progress Note:  Called by Dr. Katrinka Blazing regarding patient.  I have evaluate her chart

## 2021-03-26 NOTE — Progress Notes (Signed)
Progress Note:  Called by Dr. Katrinka Blazing regarding patient.  I have evaluated her chart and have personally reviewed her imaging.  CT abdomen and pelvis demonstrating acute pancreatitis without evidence of necrosis. US demonstrating cholelithiasis without signs of cholecystitis. Patient appears to have gallstone pancreatitis. With an elevated total bilirubin of 4.1, MRCP would be the appropriate next test to evaluate for choledocholithiasis. Will await results of imaging. Patient will be evaluated by the general surgery team first thing in the morning.   Arleigh Dicola, DO

## 2021-03-27 ENCOUNTER — Encounter: Payer: Self-pay | Admitting: Family Medicine

## 2021-03-27 DIAGNOSIS — I10 Essential (primary) hypertension: Secondary | ICD-10-CM

## 2021-03-27 DIAGNOSIS — K851 Biliary acute pancreatitis without necrosis or infection: Principal | ICD-10-CM

## 2021-03-27 DIAGNOSIS — K8020 Calculus of gallbladder without cholecystitis without obstruction: Secondary | ICD-10-CM | POA: Insufficient documentation

## 2021-03-27 LAB — COMPREHENSIVE METABOLIC PANEL
ALT: 417 U/L — ABNORMAL HIGH (ref 0–44)
AST: 163 U/L — ABNORMAL HIGH (ref 15–41)
Albumin: 3.4 g/dL — ABNORMAL LOW (ref 3.5–5.0)
Alkaline Phosphatase: 244 U/L — ABNORMAL HIGH (ref 38–126)
Anion gap: 8 (ref 5–15)
BUN: 7 mg/dL (ref 6–20)
CO2: 24 mmol/L (ref 22–32)
Calcium: 8.9 mg/dL (ref 8.9–10.3)
Chloride: 102 mmol/L (ref 98–111)
Creatinine, Ser: 0.51 mg/dL (ref 0.44–1.00)
GFR, Estimated: >60 mL/min (ref >60)
Glucose, Bld: 75 mg/dL (ref 70–99)
Potassium: 3.4 mmol/L — ABNORMAL LOW (ref 3.5–5.1)
Sodium: 134 mmol/L — ABNORMAL LOW (ref 135–145)
Total Bilirubin: 2.2 mg/dL — ABNORMAL HIGH (ref 0.3–1.2)
Total Protein: 6.8 g/dL (ref 6.5–8.1)

## 2021-03-27 LAB — CBC
HCT: 37.4 % (ref 36.0–46.0)
Hemoglobin: 12 g/dL (ref 12.0–15.0)
MCH: 25.3 pg — ABNORMAL LOW (ref 26.0–34.0)
MCHC: 32.1 g/dL (ref 30.0–36.0)
MCV: 78.7 fL — ABNORMAL LOW (ref 80.0–100.0)
Platelets: 306 10*3/uL (ref 150–400)
RBC: 4.75 MIL/uL (ref 3.87–5.11)
RDW: 16.1 % — ABNORMAL HIGH (ref 11.5–15.5)
WBC: 14.6 10*3/uL — ABNORMAL HIGH (ref 4.0–10.5)
nRBC: 0 % (ref 0.0–0.2)

## 2021-03-27 LAB — HEPATITIS PANEL, ACUTE
HCV Ab: NONREACTIVE
Hep A IgM: NONREACTIVE
Hep B C IgM: NONREACTIVE
Hepatitis B Surface Ag: NONREACTIVE

## 2021-03-27 LAB — IRON AND TIBC
Iron: 26 ug/dL — ABNORMAL LOW (ref 28–170)
Saturation Ratios: 6 % — ABNORMAL LOW (ref 10.4–31.8)
TIBC: 419 ug/dL (ref 250–450)
UIBC: 393 ug/dL

## 2021-03-27 LAB — ACETAMINOPHEN LEVEL: Acetaminophen (Tylenol), Serum: <10 ug/mL — ABNORMAL LOW (ref 10–30)

## 2021-03-27 LAB — SALICYLATE LEVEL: Salicylate Lvl: <7 mg/dL — ABNORMAL LOW (ref 7.0–30.0)

## 2021-03-27 LAB — TRIGLYCERIDES: Triglycerides: 84 mg/dL (ref <150)

## 2021-03-27 LAB — FERRITIN: Ferritin: 32 ng/mL (ref 11–307)

## 2021-03-27 LAB — LIPASE, BLOOD: Lipase: 67 U/L — ABNORMAL HIGH (ref 11–51)

## 2021-03-27 MED ORDER — HYDROMORPHONE HCL 1 MG/ML IJ SOLN
0.5000 mg | INTRAMUSCULAR | Status: DC | PRN
  Administered 2021-03-27 – 2021-03-29 (×15): 0.5 mg via INTRAVENOUS
  Filled 2021-03-27: qty 0.5
  Filled 2021-03-27: qty 1
  Filled 2021-03-27 (×3): qty 0.5
  Filled 2021-03-27: qty 1
  Filled 2021-03-27: qty 0.5
  Filled 2021-03-27: qty 1
  Filled 2021-03-27 (×7): qty 0.5

## 2021-03-27 MED ORDER — HYDROMORPHONE HCL 1 MG/ML IJ SOLN
0.5000 mg | Freq: Once | INTRAMUSCULAR | Status: AC
  Administered 2021-03-27: 02:00:00 0.5 mg via INTRAVENOUS
  Filled 2021-03-27: qty 1

## 2021-03-27 NOTE — Progress Notes (Addendum)
PROGRESS NOTE    Ashley Howell  KCM:[id number] DOB: Jun 26, 1990 DOA: 03/26/2021 PCP: [facility]   Brief Narrative:  Ashley Howell is a 30 y.o. African-American female with medical history significant for hypertension and ongoing tobacco abuse, who presented to the ER with acute onset of upper abdominal pain and substernal chest pain that started yesterday with associated vomiting without bilious vomitus or hematemesis.  She denies any fever or chills.  She came to the ER yesterday and was in significant pain and could not wait.  She has been having almost no p.o. intake since yesterday.  When she came to the ER her lipase level was 8036 yesterday and it was down to 359 today.  Her total bili was 3.4 and came up to 4.1 and alk phos 194 and came up to 281 on her AST and ALT decreased.  She denies any diarrhea or constipation, melena or bright red blood per rectum.  No dyspnea or cough or wheezing.  She did not notice jaundice.  No dysuria, oliguria or hematuria or flank pain.   ED Course: When she came to the ER, blood pressure was 146/104 with a heart rate of 107 with otherwise normal vital signs.  Labs revealed hypokalemia of 3 and alk phos of 281 compared to 194 lipase of 359 compared to 8036, AST of 356 down from 1163 and ALT 562 down from 699 with a total bili of 4.1 up from 3.4 yesterday.  LDH was 292.  Lipid profile showed triglycerides of 101.  Lactic acid was 1 and later 0.7 and procalcitonin less than 0.1.  Her CBC showed leukocytosis of 11.5 compared to 9.7 yesterday and microcytosis.  Urine pregnancy test was negative.  Wet prep and GC and chlamydia came back negative. EKG as reviewed by me : EKG showed sinus tachycardia with a rate of 114.  Assessment & Plan:   Active Problems:   Essential hypertension   Acute gallstone pancreatitis  Acute gallstone pancreatitis: S/p MRCP which did not show intrahepatic biliary prominence or any blockage.  She was  seen by GI, per them, no intervention such as ERCP is indicated.  They recommended cholecystectomy.  General surgery is already consulted and patient waiting to see them.  Her pain did not improve with morphine, transition her to Dilaudid and she is feeling much better now.  We will keep her n.p.o. with IV fluids until plans from surgery are clarified.  Triglyceride and multiple other labs are pending.  Elevated LFTs: Likely secondary to CBD stone which is likely passing now that LFTs are improving.  Monitor daily.  Hypokalemia: Replace.  Essential hypertension: Controlled.  Continue amlodipine.   DVT prophylaxis:    Code Status: Full Code  Family Communication:  None present at bedside.  Plan of care discussed with patient in length and he verbalized understanding and agreed with it.  Status is: Inpatient  Remains inpatient appropriate because: Acute pancreatitis needs inpatient management.  Estimated body mass index is 43.85 kg/m as calculated from the following:   Height as of this encounter: _0  (1.702 m).   Weight as of this encounter: 127 kg.  Nutritional Assessment: Body mass index is 43.85 kg/m.Marland Kitchen Seen by dietician.  I agree with the assessment and plan as outlined below: Nutrition Status:   Skin Assessment: I have examined the patient's skin and I agree with the wound assessment as performed by the wound care RN as outlined below:    Consultants:  GI General  surgery  Procedures:  None  Antimicrobials:  Anti-infectives (From admission, onward)    None          Subjective: Seen and examined.  She just received Dilaudid not too long ago and now she has no pain no nausea or other complaint.  Objective: Vitals:   03/26/21 1553 03/26/21 1614 03/27/21 0115 03/27/21 0613  BP:  (!) 146/104 (!) 148/97 (!) 151/81  Pulse:  (!) 107 (!) 102 100  Resp:  _0 Temp:  97.9 F (36.6 C)  98.9 F (37.2 C)  TempSrc:  Oral  Oral  SpO2:  97% 98% 99%  Weight: 127  kg     Height: _1  (1.702 m)       Intake/Output Summary (Last 24 hours) at 03/27/2021 0916 Last data filed at 03/27/2021 0155 Gross per 24 hour  Intake 1000 ml  Output --  Net 1000 ml   Filed Weights   03/26/21 1525 03/26/21 1553  Weight: 127 kg 127 kg    Examination:  General exam: Appears calm and comfortable, obese Respiratory system: Clear to auscultation. Respiratory effort normal. Cardiovascular system: S1 & S2 heard, RRR. No JVD, murmurs, rubs, gallops or clicks. No pedal edema. Gastrointestinal system: Abdomen is nondistended, soft and generalized tenderness but more pronounced in epigastric and right upper quadrant region.. No organomegaly or masses felt. Normal bowel sounds heard. Central nervous system: Alert and oriented. No focal neurological deficits. Extremities: Symmetric 5 x 5 power. Skin: No rashes, lesions or ulcers Psychiatry: Judgement and insight appear normal. Mood & affect appropriate.    Data Reviewed: I have personally reviewed following labs and imaging studies  CBC: Recent Labs  Lab 03/25/21 1733 03/26/21 1615 03/27/21 0525  WBC 9.7 11.5* 14.6*  NEUTROABS  --  9.3*  --   HGB 13.1 13.6 12.0  HCT 42.2 42.7 37.4  MCV 79.8* 79.8* 78.7*  PLT 380 351 620   Basic Metabolic Panel: Recent Labs  Lab 03/25/21 1733 03/26/21 1615 03/27/21 0525  NA 137 137 134*  K 3.6 3.0* 3.4*  CL 107 106 102  CO2 _2 GLUCOSE 99 107* 75  BUN _3 CREATININE 0.73 0.67 0.51  CALCIUM 9.1 9.2 8.9   GFR: Estimated Creatinine Clearance: 142.5 mL/min (by C-G formula based on SCr of 0.51 mg/dL). Liver Function Tests: Recent Labs  Lab 03/25/21 1733 03/26/21 1615 03/27/21 0525  AST 1,163* 356* 163*  ALT 699* 562* 417*  ALKPHOS 194* 281* 244*  BILITOT 3.4* 4.1* 2.2*  PROT 7.9 7.9 6.8  ALBUMIN 4.0 4.0 3.4*   Recent Labs  Lab 03/25/21 1733 03/26/21 1615  LIPASE 8,036* 359*   No results for input(s): AMMONIA in the last 168  hours. Coagulation Profile: No results for input(s): INR, PROTIME in the last 168 hours. Cardiac Enzymes: No results for input(s): CKTOTAL, CKMB, CKMBINDEX, TROPONINI in the last 168 hours. BNP (last 3 results) No results for input(s): PROBNP in the last 8760 hours. HbA1C: No results for input(s): HGBA1C in the last 72 hours. CBG: No results for input(s): GLUCAP in the last 168 hours. Lipid Profile: Recent Labs    03/26/21 1615  CHOL 166  HDL 50  LDLCALC 101*  TRIG 75  CHOLHDL 3.3   Thyroid Function Tests: No results for input(s): TSH, T4TOTAL, FREET4, T3FREE, THYROIDAB in the last 72 hours. Anemia Panel: No results for input(s): VITAMINB12, FOLATE, FERRITIN, TIBC, IRON, RETICCTPCT in the last 72 hours. Sepsis Labs: Recent  Labs  Lab 03/26/21 1615 03/26/21 1729  PROCALCITON <0.10  --   LATICACIDVEN 1.0 0.7    Recent Results (from the past 240 hour(s))  Chlamydia/NGC rt PCR ([place] only)     Status: None   Collection Time: 03/26/21  5:29 PM   Specimen: Cervical/Vaginal swab; GU  Result Value Ref Range Status   Specimen source GC/Chlam ENDOCERVICAL  Final   Chlamydia Tr NOT DETECTED NOT DETECTED Final   N gonorrhoeae NOT DETECTED NOT DETECTED Final    Comment: (NOTE) This CT/NG assay has not been evaluated in patients with a history of  hysterectomy. Performed at [facility], [address] [place], [place] [address]   Wet prep, genital     Status: None   Collection Time: 03/26/21  5:29 PM   Specimen: Cervical/Vaginal swab  Result Value Ref Range Status   Yeast Wet Prep HPF POC NONE SEEN NONE SEEN Final   Trich, Wet Prep NONE SEEN NONE SEEN Final   Clue Cells Wet Prep HPF POC NONE SEEN NONE SEEN Final   WBC, Wet Prep HPF POC <10 <10 Final   Sperm PRESENT  Final    Comment: Performed at [facility], [address], [place], [place] [address]  Resp Panel by RT-PCR (Flu A&B, Covid) Nasopharyngeal Swab     Status: None   Collection Time:  03/26/21  6:09 PM   Specimen: Nasopharyngeal Swab; Nasopharyngeal(NP) swabs in vial transport medium  Result Value Ref Range Status   SARS Coronavirus 2 by RT PCR NEGATIVE NEGATIVE Final    Comment: (NOTE) SARS-CoV-2 target nucleic acids are NOT DETECTED.  The SARS-CoV-2 RNA is generally detectable in upper respiratory specimens during the acute phase of infection. The lowest concentration of SARS-CoV-2 viral copies this assay can detect is 138 copies/mL. A negative result does not preclude SARS-Cov-2 infection and should not be used as the sole basis for treatment or other patient management decisions. A negative result may occur with  improper specimen collection/handling, submission of specimen other than nasopharyngeal swab, presence of viral mutation(s) within the areas targeted by this assay, and inadequate number of viral copies(<138 copies/mL). A negative result must be combined with clinical observations, patient history, and epidemiological information. The expected result is Negative.  Fact Sheet for Patients:  EntrepreneurPulse.com.au  Fact Sheet for Healthcare Providers:  IncredibleEmployment.be  This test is no t yet approved or cleared by the [place] FDA and  has been authorized for detection and/or diagnosis of SARS-CoV-2 by FDA under an Emergency Use Authorization (EUA). This EUA will remain  in effect (meaning this test can be used) for the duration of the COVID-19 declaration under Section 564(b)(1) of the Act, 21 U.S.C.section 360bbb-3(b)(1), unless the authorization is terminated  or revoked sooner.       Influenza A by PCR NEGATIVE NEGATIVE Final   Influenza B by PCR NEGATIVE NEGATIVE Final    Comment: (NOTE) The Xpert Xpress SARS-CoV-2/FLU/RSV plus assay is intended as an aid in the diagnosis of influenza from Nasopharyngeal swab specimens and should not be used as a sole basis for treatment. Nasal washings  and aspirates are unacceptable for Xpert Xpress SARS-CoV-2/FLU/RSV testing.  Fact Sheet for Patients: EntrepreneurPulse.com.au  Fact Sheet for Healthcare Providers: IncredibleEmployment.be  This test is not yet approved or cleared by the [place] FDA and has been authorized for detection and/or diagnosis of SARS-CoV-2 by FDA under an Emergency Use Authorization (EUA). This EUA will remain in effect (meaning this test can  be used) for the duration of the COVID-19 declaration under Section 564(b)(1) of the Act, 21 U.S.C. section 360bbb-3(b)(1), unless the authorization is terminated or revoked.  Performed at [facility], [address], [place], [place] [address]       Radiology Studies: CT ABDOMEN PELVIS W CONTRAST  Result Date: 03/26/2021 CLINICAL DATA:  Left upper quadrant pain, diagnosis pancreatitis several months ago and patient states feels similar. EXAM: CT ABDOMEN AND PELVIS WITH CONTRAST TECHNIQUE: Multidetector CT imaging of the abdomen and pelvis was performed using the standard protocol following bolus administration of intravenous contrast. CONTRAST:  167mL OMNIPAQUE IOHEXOL 300 MG/ML  SOLN COMPARISON:  MRI abdomen 12/18/2020 FINDINGS: Lower chest: No acute abnormality. Hepatobiliary: No focal liver abnormality is seen. Probable sludge in the gallbladder. No gallbladder wall thickening. No intra or extrahepatic biliary duct dilation. Pancreas: The pancreas appears edematous with marked peripancreatic fat stranding. There is no hypoattenuation to suggest necrosis. No central duct dilation. No focal peripancreatic fluid collection. Spleen: Normal in size without focal abnormality. Adrenals/Urinary Tract: Adrenal glands are unremarkable. Kidneys are normal, without renal calculi, focal lesion, or hydronephrosis. Bladder is unremarkable. Stomach/Bowel: Stomach is within normal limits. Appendix appears normal. No evidence of bowel  wall thickening, distention, or inflammatory changes. Vascular/Lymphatic: No significant vascular findings are present. Mildly enlarged peripancreatic lymph nodes, likely reactive for example series 2, image 22 measuring 1.1 cm in short axis. Reproductive: Uterus and bilateral adnexa are unremarkable. Other: Moderate free fluid in the pelvis, likely reactive. No abdominal wall hernia. Musculoskeletal: No acute or significant osseous findings. IMPRESSION: 1. Findings consistent with acute pancreatitis. No evidence of pancreatic necrosis or focal peripancreatic fluid collection. 2. Moderate free fluid in the pelvis, likely reactive. 3. Mildly enlarged peripancreatic lymph nodes, likely reactive. Electronically Signed   By: Audie Pinto M.D.   On: 03/26/2021 18:14   MR ABDOMEN MRCP W WO CONTAST  Result Date: 03/26/2021 CLINICAL DATA:  Cholelithiasis and pancreatitis. EXAM: MRI ABDOMEN WITHOUT AND WITH CONTRAST (INCLUDING MRCP) TECHNIQUE: Multiplanar multisequence MR imaging of the abdomen was performed both before and after the administration of intravenous contrast. Heavily T2-weighted images of the biliary and pancreatic ducts were obtained, and three-dimensional MRCP images were rendered by post processing. CONTRAST:  41mL GADAVIST GADOBUTROL 1 MMOL/ML IV SOLN COMPARISON:  CT with IV contrast today, MRI/MRCP 12/18/2020. FINDINGS: Patient motion variably limits all sequences, but particularly limits the postcontrast images and MRCP sequences. Lower chest: No acute findings. Hepatobiliary: 17 cm in length slightly steatotic liver without mass. Gallbladder is mildly distended with layering stones up to 1.6 cm but no wall thickening is seen or pericholecystic edema. The common bile duct is borderline prominent at 6.6 mm but this was seen previously and is unchanged. There is no appreciable ductal filling defect allowing for motion. Pancreas: There is nonlocalizing peripancreatic fluid, also seen on the prior  study, extending into the perihepatic and perisplenic spaces and lesser sac and small amounts tracking along both paracolic gutters, and there is a moderately extensive peripancreatic edematous reaction consistent with acute pancreatitis. No pancreatic ductal dilatation is seen. In September the edema was seen mainly along the body and tail of pancreas but is now present diffusely around the pancreas, with somewhat increased fluid. There is no focal hypoenhancement to suspect necrosis, no peripancreatic hemorrhage is seen allowing for motion. There is no mass enhancement. Spleen:  Within normal limits in size and appearance. Adrenals/Urinary Tract: No adrenal/renal mass, hydronephrosis or perinephric fluid are seen. Stomach/Bowel: No dilatation  or wall thickening in the visualized bowel including the appendix which is retrocecal. Vascular/Lymphatic: Mildly prominent peripancreatic lymph nodes up to 1.1 cm in short axis are redemonstrated. No other adenopathy. No abdominal aortic aneurysm. Other:  Motion degraded images. Musculoskeletal: No suspicious bone lesions identified. IMPRESSION: 1. Findings of acute pancreatitis without evidence of peripancreatic hemorrhage, pancreatic mass or necrosis or pancreatic ductal dilatation. There were similar findings on 12/18/2020 but today the peripancreatic edema is more diffuse and there is mild but nonlocalizing increased reactive fluid including around the liver, spleen and small amounts in the paracolic gutters. 2. Cholelithiasis and mild gallbladder distention, without evidence of wall thickening or inflammatory change. 3. Allowing for motion no extrahepatic ductal filling defect is seen with borderline prominent CBD unchanged and no intrahepatic biliary prominence. 4. Mildly prominent likely reactive peripancreatic lymph nodes. 5. Motion limited exam. Electronically Signed   By: Telford Nab M.D.   On: 03/26/2021 22:31   US ABDOMEN LIMITED RUQ (LIVER/GB)  Result  Date: 03/26/2021 CLINICAL DATA:  Bilirubinemia EXAM: ULTRASOUND ABDOMEN LIMITED RIGHT UPPER QUADRANT COMPARISON:  Abdominal ultrasound 12/18/2020 there are multiple gallstones FINDINGS: Gallbladder: There are multiple gallstones measuring up to 1.0 cm. No gallbladder wall thickening. Negative sonographic Murphy sign. Common bile duct: Diameter: 0.5 cm, within normal limits Liver: No focal lesion identified. Within normal limits in parenchymal echogenicity. Portal vein is patent on color Doppler imaging with normal direction of blood flow towards the liver. Other: None. IMPRESSION: Cholelithiasis without evidence of acute cholecystitis. Otherwise unremarkable sonographic exam of the right upper quadrant. Electronically Signed   By: Audie Pinto M.D.   On: 03/26/2021 19:23    Scheduled Meds:  amLODipine  5 mg Oral Daily   enoxaparin (LOVENOX) injection  0.5 mg/kg Subcutaneous Q24H   Continuous Infusions:  0.9 % NaCl with KCl 20 mEq / L 100 mL/hr at 03/27/21 0639     LOS: 1 day   Time spent: 32 minutes   Darliss Cheney, MD [facility]  03/27/2021, 9:16 AM  Please page via [facility] and do not message via secure chat for anything urgent. Secure chat can be used for anything non urgent.  How to contact the [facility] Attending or Consulting provider [place] or covering provider during after hours [place], for this patient?  Check the care team in [facility] and look for a) attending/consulting [facility] provider listed and b) the [facility] team listed. Page or secure chat [phone]. Log into [email or web address] and use [place]'s universal password to access. If you do not have the password, please contact the hospital operator. Locate the [facility] provider you are looking for under [facility] and page to a number that you can be directly reached. If you still have difficulty reaching the provider, please page the [facility] (Director on Call) for the Hospitalists listed on amion for assistance.

## 2021-03-27 NOTE — Consult Note (Signed)
Inpatient Consultation   Patient ID: Ashley Howell is a 30 y.o. female.  Requesting Provider: Valente David, MD  Date of Admission: 03/26/2021  Date of Consult: 03/27/21   Reason for Consultation: abdominal pain, elevated lfts   Patient's Chief Complaint:   Chief Complaint  Patient presents with   Flank Pain   Abdominal Pain    30 y/o Female with h/o pancreatitis, htn, etoh use, tobacco dependence who presents with abdominal pain and found to have elevated LFTs.  Pt presented on 12/17 to ED, but left prior to being seen. Had similar episode in September. Notes epigastric and left sided abdominal pain with nausea and vomiting. LFTs and lipase were performed and grossly elevated. A CT, [place], and MRCP were performed. No choledocholithiasis was noted, although labs supported obstructive pattern. CBD measuring 6.6 mm which is ULN for intact gallbladder in young person. No fever or chills or jaundice. TB reached 4.1 but now 2.2 Still noting discomfort while resting in bed.  Currently NPO. Liver Labs have improved since admission suspicious for passed stone/sludge Denies etoh use. Has been taking nsaids at home to deal with this pain. No hematemesis, coffee ground emesis, diarrhea, constipation, melena, brbpr.  Denies Anti-plt agents, and anticoagulants Denies family history of gastrointestinal disease and malignancy Previous Endoscopies: None  Past Medical History:  Diagnosis Date   Anxiety    ASD (atrial septal defect)    Benign essential hypertension, antepartum 05/19/2018   160/99 at anatomy scan 18 weeks   Pt sent to ER on 05/15/2018 resolved with rest   Elevated at u/s visit - labetalol 100 mg bid initiated   Hx of cardiomegaly    Hypertension    PFO (patent foramen ovale)    a. 12/2014 Echo: EF 55-60%, mildly dil LA. PFO w/o significant shunting (reviewed by Dr. Mariah Milling 07/2018).   Shortness of breath dyspnea    Spinal headache     Past Surgical History:  Procedure  Laterality Date   CESAREAN SECTION  10/28/2008   CESAREAN SECTION N/A 02/22/2015   Procedure: CESAREAN SECTION;  Surgeon: Nadara Mustard, MD;  Location: [facility];  Service: Obstetrics;  Laterality: N/A;   CESAREAN SECTION  04/13/2010   CESAREAN SECTION N/A 10/09/2018   Procedure: CESAREAN SECTION;  Surgeon: Natale Milch, MD;  Location: [facility];  Service: Obstetrics;  Laterality: N/A;   CESAREAN SECTION WITH BILATERAL TUBAL LIGATION Bilateral 10/06/2020   Procedure: CESAREAN SECTION WITH BILATERAL TUBAL LIGATION;  Surgeon: Natale Milch, MD;  Location: [facility];  Service: Obstetrics;  Laterality: Bilateral;    No Known Allergies  Family History  Problem Relation Age of Onset   Hypertension Mother    Stroke Paternal Aunt    Cancer Maternal Grandfather        Colon    Social History   Tobacco Use   Smoking status: Some Days    Packs/day: 0.25    Years: 2.00    Pack years: 0.50    Types: Cigarettes   Smokeless tobacco: Never  Vaping Use   Vaping Use: Never used  Substance Use Topics   Alcohol use: No   Drug use: No     Pertinent GI related history and allergies were reviewed with the patient  Review of Systems  Constitutional:  Negative for activity change, appetite change, chills, diaphoresis, fatigue, fever and unexpected weight change.  HENT:  Negative for trouble swallowing and voice change.   Respiratory:  Negative for shortness of breath and  wheezing.   Cardiovascular:  Negative for chest pain, palpitations and leg swelling.  Gastrointestinal:  Positive for abdominal pain, nausea and vomiting. Negative for abdominal distention, anal bleeding, blood in stool, constipation, diarrhea and rectal pain.  Musculoskeletal:  Negative for arthralgias and myalgias.  Skin:  Negative for color change and pallor.  Neurological:  Negative for dizziness, syncope and weakness.  Psychiatric/Behavioral:  Negative for confusion.   All other systems reviewed and are  negative.   Medications Home Medications No current facility-administered medications on file prior to encounter.   Current Outpatient Medications on File Prior to Encounter  Medication Sig Dispense Refill   amLODipine (NORVASC) 5 MG tablet Take 5 mg by mouth daily.     aspirin 81 MG EC tablet Chew 81 mg by mouth daily. (Patient not taking: Reported on 03/26/2021)     ferrous sulfate 325 (65 FE) MG tablet Take 1 tablet (325 mg total) by mouth 2 (two) times daily with a meal. (Patient not taking: Reported on 03/26/2021)  3   NIFEdipine (PROCARDIA-XL/NIFEDICAL-XL) 30 MG 24 hr tablet Take 3 tablets (90 mg total) by mouth daily. (Patient not taking: Reported on 03/26/2021) 30 tablet 3   Pertinent GI related medications were reviewed with the patient  Inpatient Medications  Current Facility-Administered Medications:    0.9 % NaCl with KCl 20 mEq/ L  infusion, , Intravenous, Continuous, Mansy, Jan A, MD, Last Rate: 100 mL/hr at 03/27/21 0639, New Bag at 03/27/21 1610   acetaminophen (TYLENOL) tablet 650 mg, 650 mg, Oral, Q6H PRN **OR** acetaminophen (TYLENOL) suppository 650 mg, 650 mg, Rectal, Q6H PRN, Mansy, Jan A, MD   amLODipine (NORVASC) tablet 5 mg, 5 mg, Oral, Daily, Mansy, Jan A, MD, 5 mg at 03/26/21 2214   enoxaparin (LOVENOX) injection 62.5 mg, 0.5 mg/kg, Subcutaneous, Q24H, Rauer, Brei Pociask, RPH   magnesium hydroxide (MILK OF MAGNESIA) suspension 30 mL, 30 mL, Oral, Daily PRN, Mansy, Jan A, MD   morphine 2 MG/ML injection 2 mg, 2 mg, Intravenous, Q4H PRN, Mansy, Jan A, MD, 2 mg at 03/27/21 0620   ondansetron (ZOFRAN) tablet 4 mg, 4 mg, Oral, Q6H PRN **OR** ondansetron (ZOFRAN) injection 4 mg, 4 mg, Intravenous, Q6H PRN, Mansy, Jan A, MD, 4 mg at 03/27/21 9604   traZODone (DESYREL) tablet 25 mg, 25 mg, Oral, QHS PRN, Mansy, Jan A, MD  Current Outpatient Medications:    amLODipine (NORVASC) 5 MG tablet, Take 5 mg by mouth daily., Disp: , Rfl:    aspirin 81 MG EC tablet, Chew 81 mg by  mouth daily. (Patient not taking: Reported on 03/26/2021), Disp: , Rfl:    ferrous sulfate 325 (65 FE) MG tablet, Take 1 tablet (325 mg total) by mouth 2 (two) times daily with a meal. (Patient not taking: Reported on 03/26/2021), Disp: , Rfl: 3   NIFEdipine (PROCARDIA-XL/NIFEDICAL-XL) 30 MG 24 hr tablet, Take 3 tablets (90 mg total) by mouth daily. (Patient not taking: Reported on 03/26/2021), Disp: 30 tablet, Rfl: 3  0.9 % NaCl with KCl 20 mEq / L 100 mL/hr at 03/27/21 5409    acetaminophen **OR** acetaminophen, magnesium hydroxide, morphine injection, ondansetron **OR** ondansetron (ZOFRAN) IV, traZODone   Objective   Vitals:   03/26/21 1553 03/26/21 1614 03/27/21 0115 03/27/21 0613  BP:  (!) 146/104 (!) 148/97 (!) 151/81  Pulse:  (!) 107 (!) 102 100  Resp:  Temp:  97.9 F (36.6 C)  98.9 F (37.2 C)  TempSrc:  Oral  Oral  SpO2:  97% 98% 99%  Weight: 127 kg     Height: 5\' 7"  (1.702 m)        Physical Exam Vitals and nursing note reviewed.  Constitutional:      General: She is not in acute distress.    Appearance: Normal appearance. She is obese. She is not ill-appearing, toxic-appearing or diaphoretic.  HENT:     Head: Normocephalic and atraumatic.     Nose: Nose normal.     Mouth/Throat:     Mouth: Mucous membranes are moist.     Pharynx: Oropharynx is clear.  Eyes:     General: No scleral icterus.    Extraocular Movements: Extraocular movements intact.  Cardiovascular:     Rate and Rhythm: Regular rhythm. Tachycardia present.     Heart sounds: Normal heart sounds. No murmur heard.   No friction rub. No gallop.  Pulmonary:     Effort: Pulmonary effort is normal. No respiratory distress.     Breath sounds: Normal breath sounds. No wheezing, rhonchi or rales.  Abdominal:     General: Bowel sounds are normal. There is no distension.     Palpations: Abdomen is soft.     Tenderness: There is abdominal tenderness (left sided). There is no guarding or rebound.   Musculoskeletal:     Cervical back: Neck supple.     Right lower leg: No edema.     Left lower leg: No edema.  Skin:    General: Skin is warm and dry.     Coloration: Skin is not jaundiced or pale.  Neurological:     General: No focal deficit present.     Mental Status: She is alert and oriented to person, place, and time. Mental status is at baseline.  Psychiatric:        Mood and Affect: Mood normal.        Behavior: Behavior normal.        Thought Content: Thought content normal.        Judgment: Judgment normal.    Laboratory Data Recent Labs  Lab 03/25/21 1733 03/26/21 1615 03/27/21 0525  WBC 9.7 11.5* 14.6*  HGB 13.1 13.6 12.0  HCT 42.2 42.7 37.4  PLT 380 351 306   Recent Labs  Lab 03/25/21 1733 03/26/21 1615 03/27/21 0525  NA 137 137 134*  K 3.6 3.0* 3.4*  CL 107 106 102  CO2 24 23 24   BUN 10 10 7   CALCIUM 9.1 9.2 8.9  PROT 7.9 7.9 6.8  BILITOT 3.4* 4.1* 2.2*  ALKPHOS 194* 281* 244*  ALT 699* 562* 417*  AST 1,163* 356* 163*  GLUCOSE 99 107* 75   No results for input(s): INR in the last 168 hours.  Recent Labs    03/26/21 1615  LIPASE 359*        Imaging Studies: CT ABDOMEN PELVIS W CONTRAST  Result Date: 03/26/2021 CLINICAL DATA:  Left upper quadrant pain, diagnosis pancreatitis several months ago and patient states feels similar. EXAM: CT ABDOMEN AND PELVIS WITH CONTRAST TECHNIQUE: Multidetector CT imaging of the abdomen and pelvis was performed using the standard protocol following bolus administration of intravenous contrast. CONTRAST:  OMNIPAQUE IOHEXOL 300 MG/ML  SOLN COMPARISON:  MRI abdomen 12/18/2020 FINDINGS: Lower chest: No acute abnormality. Hepatobiliary: No focal liver abnormality is seen. Probable sludge in the gallbladder. No gallbladder wall thickening. No intra or extrahepatic biliary duct dilation. Pancreas: The pancreas appears edematous with marked peripancreatic fat stranding. There is no hypoattenuation  to suggest  necrosis. No central duct dilation. No focal peripancreatic fluid collection. Spleen: Normal in size without focal abnormality. Adrenals/Urinary Tract: Adrenal glands are unremarkable. Kidneys are normal, without renal calculi, focal lesion, or hydronephrosis. Bladder is unremarkable. Stomach/Bowel: Stomach is within normal limits. Appendix appears normal. No evidence of bowel wall thickening, distention, or inflammatory changes. Vascular/Lymphatic: No significant vascular findings are present. Mildly enlarged peripancreatic lymph nodes, likely reactive for example series 2, image 22 measuring 1.1 cm in short axis. Reproductive: Uterus and bilateral adnexa are unremarkable. Other: Moderate free fluid in the pelvis, likely reactive. No abdominal wall hernia. Musculoskeletal: No acute or significant osseous findings. IMPRESSION: 1. Findings consistent with acute pancreatitis. No evidence of pancreatic necrosis or focal peripancreatic fluid collection. 2. Moderate free fluid in the pelvis, likely reactive. 3. Mildly enlarged peripancreatic lymph nodes, likely reactive. Electronically Signed   By: Emmaline Kluver M.D.   On: 03/26/2021 18:14   MR ABDOMEN MRCP W WO CONTAST  Result Date: 03/26/2021 CLINICAL DATA:  Cholelithiasis and pancreatitis. EXAM: MRI ABDOMEN WITHOUT AND WITH CONTRAST (INCLUDING MRCP) TECHNIQUE: Multiplanar multisequence MR imaging of the abdomen was performed both before and after the administration of intravenous contrast. Heavily T2-weighted images of the biliary and pancreatic ducts were obtained, and three-dimensional MRCP images were rendered by post processing. CONTRAST:  68mL GADAVIST GADOBUTROL 1 MMOL/ML IV SOLN COMPARISON:  CT with IV contrast today, MRI/MRCP 12/18/2020. FINDINGS: Patient motion variably limits all sequences, but particularly limits the postcontrast images and MRCP sequences. Lower chest: No acute findings. Hepatobiliary: 17 cm in length slightly steatotic liver  without mass. Gallbladder is mildly distended with layering stones up to 1.6 cm but no wall thickening is seen or pericholecystic edema. The common bile duct is borderline prominent at 6.6 mm but this was seen previously and is unchanged. There is no appreciable ductal filling defect allowing for motion. Pancreas: There is nonlocalizing peripancreatic fluid, also seen on the prior study, extending into the perihepatic and perisplenic spaces and lesser sac and small amounts tracking along both paracolic gutters, and there is a moderately extensive peripancreatic edematous reaction consistent with acute pancreatitis. No pancreatic ductal dilatation is seen. In September the edema was seen mainly along the body and tail of pancreas but is now present diffusely around the pancreas, with somewhat increased fluid. There is no focal hypoenhancement to suspect necrosis, no peripancreatic hemorrhage is seen allowing for motion. There is no mass enhancement. Spleen:  Within normal limits in size and appearance. Adrenals/Urinary Tract: No adrenal/renal mass, hydronephrosis or perinephric fluid are seen. Stomach/Bowel: No dilatation or wall thickening in the visualized bowel including the appendix which is retrocecal. Vascular/Lymphatic: Mildly prominent peripancreatic lymph nodes up to 1.1 cm in short axis are redemonstrated. No other adenopathy. No abdominal aortic aneurysm. Other:  Motion degraded images. Musculoskeletal: No suspicious bone lesions identified. IMPRESSION: 1. Findings of acute pancreatitis without evidence of peripancreatic hemorrhage, pancreatic mass or necrosis or pancreatic ductal dilatation. There were similar findings on 12/18/2020 but today the peripancreatic edema is more diffuse and there is mild but nonlocalizing increased reactive fluid including around the liver, spleen and small amounts in the paracolic gutters. 2. Cholelithiasis and mild gallbladder distention, without evidence of wall thickening  or inflammatory change. 3. Allowing for motion no extrahepatic ductal filling defect is seen with borderline prominent CBD unchanged and no intrahepatic biliary prominence. 4. Mildly prominent likely reactive peripancreatic lymph nodes. 5. Motion limited exam. Electronically Signed   By: Earlean Shawl.D.  On: 03/26/2021 22:31   US ABDOMEN LIMITED RUQ (LIVER/GB)  Result Date: 03/26/2021 CLINICAL DATA:  Bilirubinemia EXAM: ULTRASOUND ABDOMEN LIMITED RIGHT UPPER QUADRANT COMPARISON:  Abdominal ultrasound 12/18/2020 there are multiple gallstones FINDINGS: Gallbladder: There are multiple gallstones measuring up to 1.0 cm. No gallbladder wall thickening. Negative sonographic Murphy sign. Common bile duct: Diameter: 0.5 cm, within normal limits Liver: No focal lesion identified. Within normal limits in parenchymal echogenicity. Portal vein is patent on color Doppler imaging with normal direction of blood flow towards the liver. Other: None. IMPRESSION: Cholelithiasis without evidence of acute cholecystitis. Otherwise unremarkable sonographic exam of the right upper quadrant. Electronically Signed   By: Emmaline Kluver M.D.   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 Given this is a repeat episode, would likely benefit from cholecystectomy. No stone noted on MRCP, IOC  during cholecystectomy would be helpful if determining ERCP necessary, although LFTs specifically TB have cut in half leading to suspicion for passed stone/sludge. Abx per primary team- if develops fever/chills recommend initiation Check triglycerides R/o other etiologies of liver disease given how increased her markers were- these will take time to come back and do not require continued hospitalization for. Recommend outpatient follow up with PCP or GI to follow up labs Labs and imaging reviewed Supportive care including pain control and anti emetics as per primary team Aggressive IVF resuscitation with LR  Avoid hepatotoxic medications Hold dvt ppx Currently NPO  No acute GI intervention is indicated and labs are improving with suspected passed stone. If IOC is + or change in patient condition, please notify. Otherwise GI will be on standby  I personally performed the service.  Management of other medical comorbidities as per primary team  Thank you for allowing [place] to participate in this patient's care. Please don't hesitate to call if any questions or concerns arise.   Jaynie Collins, DO [facility]  Portions of the record may have been created with voice recognition software. Occasional wrong-word or 'sound-a-like' substitutions may have occurred due to the inherent limitations of voice recognition software.  Read the chart carefully and recognize, using context, where substitutions may have occurred.

## 2021-03-27 NOTE — Consult Note (Signed)
Date of Consultation:  03/27/2021  Requesting Physician:  Hulan Saas, MD  Reason for Consultation:  Gallstone pancreatitis  History of Present Illness: Ashley Howell is a 30 y.o. female presenting with upper abdominal pain.  The patient has a similar history of this in September 2022 and was found to have pancreatitis, with elevated LFTs with total bili 1.7, AST 226, ALT 565, Alk Phos 326, and lipase 617.  This was thought to be EtOH related despite of U/S showing cholelithiasis.  Patient was feeling better that time and was discharged home.  The reports that she had some milder pain three days ago on 12/16 which improved with some pain medication at home.  Then on 12/17, she had again significant pain and presented to the ER.  Her labwork was significant again for elevated LFTs and lipase. With total bili 3.4, AST 1163, ALT 699, Alk Phos 194, and lipase 8036.  She reports the wait time was very long and she started feeling better so she left the ER without being seen.  Unfortunately yesterday the pain got worse even more and she presented again.  These episodes of worsening happened after eating.  At home she had upper abdominal pain that radiated to her back, nausea, and one episode of emesis.  In the ER, she had labwork again, showing total bili 4.1, AST 356, ALT 356, Alk Phos 281, and lipase 359.  She had CT scan showing acute pancreatitis with significant  peripancreatic edema/stranding and moderate free fluid in the pelvis.  Given her elevated LFTs, MRCP was obtained which showed again moderately extensive peripancreatic edema, with fluid tracking in the perihepatic, perisplenic areas and along both paracolic gutters, but no pseudocyst or necrosis and no choledocholithiasis.  I have personally viewed all images and agree with the findings.    This morning, the patient reports still continued pain, though had improved after receiving Dilaudid.  Denies any nausea.  LFTs improving this morning  with total bili 2.2, AST 163, ALT 417, alk phos 244.  Lipase was not checked.  Past Medical History: Past Medical History:  Diagnosis Date   Anxiety    ASD (atrial septal defect)    Benign essential hypertension, antepartum 05/19/2018   160/99 at anatomy scan 18 weeks   Pt sent to ER on 05/15/2018 resolved with rest   Elevated at u/s visit - labetalol 100 mg bid initiated   Hx of cardiomegaly    Hypertension    PFO (patent foramen ovale)    a. 12/2014 Echo: EF 55-60%, mildly dil LA. PFO w/o significant shunting (reviewed by Dr. Rockey Situ 07/2018).   Shortness of breath dyspnea    Spinal headache      Past Surgical History: Past Surgical History:  Procedure Laterality Date   CESAREAN SECTION  10/28/2008   CESAREAN SECTION N/A 02/22/2015   Procedure: CESAREAN SECTION;  Surgeon: Gae Dry, MD;  Location: [facility] ORS;  Service: Obstetrics;  Laterality: N/A;   CESAREAN SECTION  04/13/2010   CESAREAN SECTION N/A 10/09/2018   Procedure: CESAREAN SECTION;  Surgeon: Homero Fellers, MD;  Location: [facility] ORS;  Service: Obstetrics;  Laterality: N/A;   CESAREAN SECTION WITH BILATERAL TUBAL LIGATION Bilateral 10/06/2020   Procedure: CESAREAN SECTION WITH BILATERAL TUBAL LIGATION;  Surgeon: Homero Fellers, MD;  Location: [facility] ORS;  Service: Obstetrics;  Laterality: Bilateral;    Home Medications: Prior to Admission medications   Medication Sig Start Date End Date Taking? Authorizing Provider  amLODipine (NORVASC) 5 MG tablet  Take 5 mg by mouth daily.   Yes [provider]  aspirin 81 MG EC tablet Chew 81 mg by mouth daily. Patient not taking: Reported on 03/26/2021    [provider]  ferrous sulfate 325 (65 FE) MG tablet Take 1 tablet (325 mg total) by mouth 2 (two) times daily with a meal. Patient not taking: Reported on 03/26/2021 10/08/20   Will Bonnet, MD  NIFEdipine (PROCARDIA-XL/NIFEDICAL-XL) 30 MG 24 hr tablet Take 3 tablets (90 mg total) by mouth  daily. Patient not taking: Reported on 03/26/2021 08/24/20   Homero Fellers, MD    Allergies: No Known Allergies  Social History:  reports that she has been smoking cigarettes. She has a 0.50 pack-year smoking history. She has never used smokeless tobacco. She reports that she does not drink alcohol and does not use drugs.   Family History: Family History  Problem Relation Age of Onset   Hypertension Mother    Stroke Paternal Aunt    Cancer Maternal Grandfather        Colon    Review of Systems: Review of Systems  Constitutional:  Negative for chills and fever.  HENT:  Negative for hearing loss.   Respiratory:  Negative for shortness of breath.   Cardiovascular:  Negative for chest pain.  Gastrointestinal:  Positive for abdominal pain, nausea and vomiting. Negative for constipation and diarrhea.  Genitourinary:  Negative for dysuria.  Musculoskeletal:  Negative for myalgias.  Skin:  Negative for rash.  Neurological:  Negative for dizziness.  Psychiatric/Behavioral:  Negative for depression.    Physical Exam BP 131/83    Pulse 91    Temp 98.9 F (37.2 C) (Oral)    Resp 17    Ht '5\' 7"'  (1.702 m)    Wt 127 kg    LMP 03/08/2021 (Approximate)    SpO2 98%    BMI 43.85 kg/m  CONSTITUTIONAL: No acute distress HEENT:  Normocephalic, atraumatic, extraocular motion intact. NECK: Trachea is midline, and there is no jugular venous distension. RESPIRATORY:  Normal respiratory effort without pathologic use of accessory muscles. CARDIOVASCULAR:  Regular rhythm and rate. GI: The abdomen is soft, obese, non-distended, with tenderness to palpation in upper abdomen, with LUQ and epigastric areas worse than RUQ.  Negative Murphy's sign. MUSCULOSKELETAL:  Normal muscle strength and tone in all four extremities.  No peripheral edema or cyanosis. SKIN: Skin turgor is normal. There are no pathologic skin lesions.  NEUROLOGIC:  Motor and sensation is grossly normal.  Cranial nerves are grossly  intact. PSYCH:  Alert and oriented to person, place and time. Affect is normal.  Laboratory Analysis: Results for orders placed or performed during the hospital encounter of 03/26/21 (from the past 24 hour(s))  CBC with Differential     Status: Abnormal   Collection Time: 03/26/21  4:15 PM  Result Value Ref Range   WBC 11.5 (H) 4.0 - 10.5 K/uL   RBC 5.35 (H) 3.87 - 5.11 MIL/uL   Hemoglobin 13.6 12.0 - 15.0 g/dL   HCT 42.7 36.0 - 46.0 %   MCV 79.8 (L) 80.0 - 100.0 fL   MCH 25.4 (L) 26.0 - 34.0 pg   MCHC 31.9 30.0 - 36.0 g/dL   RDW 16.1 (H) 11.5 - 15.5 %   Platelets 351 150 - 400 K/uL   nRBC 0.0 0.0 - 0.2 %   Neutrophils Relative % 82 %   Neutro Abs 9.3 (H) 1.7 - 7.7 K/uL   Lymphocytes Relative 13 %  Lymphs Abs 1.5 0.7 - 4.0 K/uL   Monocytes Relative 5 %   Monocytes Absolute 0.6 0.1 - 1.0 K/uL   Eosinophils Relative 0 %   Eosinophils Absolute 0.0 0.0 - 0.5 K/uL   Basophils Relative 0 %   Basophils Absolute 0.0 0.0 - 0.1 K/uL   Immature Granulocytes 0 %   Abs Immature Granulocytes 0.04 0.00 - 0.07 K/uL  Comprehensive metabolic panel     Status: Abnormal   Collection Time: 03/26/21  4:15 PM  Result Value Ref Range   Sodium 137 135 - 145 mmol/L   Potassium 3.0 (L) 3.5 - 5.1 mmol/L   Chloride 106 98 - 111 mmol/L   CO2 23 22 - 32 mmol/L   Glucose, Bld 107 (H) 70 - 99 mg/dL   BUN 10 6 - 20 mg/dL   Creatinine, Ser 0.67 0.44 - 1.00 mg/dL   Calcium 9.2 8.9 - 10.3 mg/dL   Total Protein 7.9 6.5 - 8.1 g/dL   Albumin 4.0 3.5 - 5.0 g/dL   AST 356 (H) 15 - 41 U/L   ALT 562 (H) 0 - 44 U/L   Alkaline Phosphatase 281 (H) 38 - 126 U/L   Total Bilirubin 4.1 (H) 0.3 - 1.2 mg/dL   GFR, Estimated >60 >60 mL/min   Anion gap 8 5 - 15  Lipase, blood     Status: Abnormal   Collection Time: 03/26/21  4:15 PM  Result Value Ref Range   Lipase 359 (H) 11 - 51 U/L  Lactate dehydrogenase     Status: Abnormal   Collection Time: 03/26/21  4:15 PM  Result Value Ref Range   LDH 292 (H) 98 - 192 U/L   Lactic acid, plasma     Status: None   Collection Time: 03/26/21  4:15 PM  Result Value Ref Range   Lactic Acid, Venous 1.0 0.5 - 1.9 mmol/L  Ethanol     Status: None   Collection Time: 03/26/21  4:15 PM  Result Value Ref Range   Alcohol, Ethyl (B) <10 <10 mg/dL  Urinalysis, Complete w Microscopic     Status: Abnormal   Collection Time: 03/26/21  4:15 PM  Result Value Ref Range   Color, Urine AMBER (A) YELLOW   APPearance CLEAR (A) CLEAR   Specific Gravity, Urine 1.025 1.005 - 1.030   pH 5.0 5.0 - 8.0   Glucose, UA NEGATIVE NEGATIVE mg/dL   Hgb urine dipstick NEGATIVE NEGATIVE   Bilirubin Urine MODERATE (A) NEGATIVE   Ketones, ur 5 (A) NEGATIVE mg/dL   Protein, ur 30 (A) NEGATIVE mg/dL   Nitrite NEGATIVE NEGATIVE   Leukocytes,Ua NEGATIVE NEGATIVE   WBC, UA 0-5 0 - 5 WBC/hpf   Bacteria, UA NONE SEEN NONE SEEN   Squamous Epithelial / LPF 0-5 0 - 5   Mucus PRESENT   Procalcitonin - Baseline     Status: None   Collection Time: 03/26/21  4:15 PM  Result Value Ref Range   Procalcitonin <0.10 ng/mL  Lipid panel     Status: Abnormal   Collection Time: 03/26/21  4:15 PM  Result Value Ref Range   Cholesterol 166 0 - 200 mg/dL   Triglycerides 75 <150 mg/dL   HDL 50 >40 mg/dL   Total CHOL/HDL Ratio 3.3 RATIO   VLDL 15 0 - 40 mg/dL   LDL Cholesterol 101 (H) 0 - 99 mg/dL  POC Urine Pregnancy, ED     Status: None   Collection Time: 03/26/21  4:23 PM  Result Value Ref Range   Preg Test, Ur NEGATIVE NEGATIVE  Lactic acid, plasma     Status: None   Collection Time: 03/26/21  5:29 PM  Result Value Ref Range   Lactic Acid, Venous 0.7 0.5 - 1.9 mmol/L  Chlamydia/NGC rt PCR ([facility] only)     Status: None   Collection Time: 03/26/21  5:29 PM   Specimen: Cervical/Vaginal swab; GU  Result Value Ref Range   Specimen source GC/Chlam ENDOCERVICAL    Chlamydia Tr NOT DETECTED NOT DETECTED   N gonorrhoeae NOT DETECTED NOT DETECTED  Wet prep, genital     Status: None   Collection Time:  03/26/21  5:29 PM   Specimen: Cervical/Vaginal swab  Result Value Ref Range   Yeast Wet Prep HPF POC NONE SEEN NONE SEEN   Trich, Wet Prep NONE SEEN NONE SEEN   Clue Cells Wet Prep HPF POC NONE SEEN NONE SEEN   WBC, Wet Prep HPF POC <10 <10   Sperm PRESENT   Resp Panel by RT-PCR (Flu A&B, Covid) Nasopharyngeal Swab     Status: None   Collection Time: 03/26/21  6:09 PM   Specimen: Nasopharyngeal Swab; Nasopharyngeal(NP) swabs in vial transport medium  Result Value Ref Range   SARS Coronavirus 2 by RT PCR NEGATIVE NEGATIVE   Influenza A by PCR NEGATIVE NEGATIVE   Influenza B by PCR NEGATIVE NEGATIVE  CBC     Status: Abnormal   Collection Time: 03/27/21  5:25 AM  Result Value Ref Range   WBC 14.6 (H) 4.0 - 10.5 K/uL   RBC 4.75 3.87 - 5.11 MIL/uL   Hemoglobin 12.0 12.0 - 15.0 g/dL   HCT 37.4 36.0 - 46.0 %   MCV 78.7 (L) 80.0 - 100.0 fL   MCH 25.3 (L) 26.0 - 34.0 pg   MCHC 32.1 30.0 - 36.0 g/dL   RDW 16.1 (H) 11.5 - 15.5 %   Platelets 306 150 - 400 K/uL   nRBC 0.0 0.0 - 0.2 %  Comprehensive metabolic panel     Status: Abnormal   Collection Time: 03/27/21  5:25 AM  Result Value Ref Range   Sodium 134 (L) 135 - 145 mmol/L   Potassium 3.4 (L) 3.5 - 5.1 mmol/L   Chloride 102 98 - 111 mmol/L   CO2 24 22 - 32 mmol/L   Glucose, Bld 75 70 - 99 mg/dL   BUN 7 6 - 20 mg/dL   Creatinine, Ser 0.51 0.44 - 1.00 mg/dL   Calcium 8.9 8.9 - 10.3 mg/dL   Total Protein 6.8 6.5 - 8.1 g/dL   Albumin 3.4 (L) 3.5 - 5.0 g/dL   AST 163 (H) 15 - 41 U/L   ALT 417 (H) 0 - 44 U/L   Alkaline Phosphatase 244 (H) 38 - 126 U/L   Total Bilirubin 2.2 (H) 0.3 - 1.2 mg/dL   GFR, Estimated >60 >60 mL/min   Anion gap 8 5 - 15  Ferritin     Status: None   Collection Time: 03/27/21  9:34 AM  Result Value Ref Range   Ferritin 32 11 - 307 ng/mL  Iron and TIBC     Status: Abnormal   Collection Time: 03/27/21  9:34 AM  Result Value Ref Range   Iron 26 (L) 28 - 170 ug/dL   TIBC 419 250 - 450 ug/dL   Saturation  Ratios 6 (L) 10.4 - 31.8 %   UIBC 393 ug/dL  Acetaminophen level     Status: Abnormal  Collection Time: 03/27/21  9:34 AM  Result Value Ref Range   Acetaminophen (Tylenol), Serum <10 (L) 10 - 30 ug/mL  Salicylate level     Status: Abnormal   Collection Time: 03/27/21  9:34 AM  Result Value Ref Range   Salicylate Lvl <8.5 (L) 7.0 - 30.0 mg/dL  Triglycerides     Status: None   Collection Time: 03/27/21  9:34 AM  Result Value Ref Range   Triglycerides 84 <150 mg/dL    Imaging: CT ABDOMEN PELVIS W CONTRAST  Result Date: 03/26/2021 CLINICAL DATA:  Left upper quadrant pain, diagnosis pancreatitis several months ago and patient states feels similar. EXAM: CT ABDOMEN AND PELVIS WITH CONTRAST TECHNIQUE: Multidetector CT imaging of the abdomen and pelvis was performed using the standard protocol following bolus administration of intravenous contrast. CONTRAST:  116m OMNIPAQUE IOHEXOL 300 MG/ML  SOLN COMPARISON:  MRI abdomen 12/18/2020 FINDINGS: Lower chest: No acute abnormality. Hepatobiliary: No focal liver abnormality is seen. Probable sludge in the gallbladder. No gallbladder wall thickening. No intra or extrahepatic biliary duct dilation. Pancreas: The pancreas appears edematous with marked peripancreatic fat stranding. There is no hypoattenuation to suggest necrosis. No central duct dilation. No focal peripancreatic fluid collection. Spleen: Normal in size without focal abnormality. Adrenals/Urinary Tract: Adrenal glands are unremarkable. Kidneys are normal, without renal calculi, focal lesion, or hydronephrosis. Bladder is unremarkable. Stomach/Bowel: Stomach is within normal limits. Appendix appears normal. No evidence of bowel wall thickening, distention, or inflammatory changes. Vascular/Lymphatic: No significant vascular findings are present. Mildly enlarged peripancreatic lymph nodes, likely reactive for example series 2, image 22 measuring 1.1 cm in short axis. Reproductive: Uterus and  bilateral adnexa are unremarkable. Other: Moderate free fluid in the pelvis, likely reactive. No abdominal wall hernia. Musculoskeletal: No acute or significant osseous findings. IMPRESSION: 1. Findings consistent with acute pancreatitis. No evidence of pancreatic necrosis or focal peripancreatic fluid collection. 2. Moderate free fluid in the pelvis, likely reactive. 3. Mildly enlarged peripancreatic lymph nodes, likely reactive. Electronically Signed   By: NAudie PintoM.D.   On: 03/26/2021 18:14   MR ABDOMEN MRCP W WO CONTAST  Result Date: 03/26/2021 CLINICAL DATA:  Cholelithiasis and pancreatitis. EXAM: MRI ABDOMEN WITHOUT AND WITH CONTRAST (INCLUDING MRCP) TECHNIQUE: Multiplanar multisequence MR imaging of the abdomen was performed both before and after the administration of intravenous contrast. Heavily T2-weighted images of the biliary and pancreatic ducts were obtained, and three-dimensional MRCP images were rendered by post processing. CONTRAST:  135mGADAVIST GADOBUTROL 1 MMOL/ML IV SOLN COMPARISON:  CT with IV contrast today, MRI/MRCP 12/18/2020. FINDINGS: Patient motion variably limits all sequences, but particularly limits the postcontrast images and MRCP sequences. Lower chest: No acute findings. Hepatobiliary: 17 cm in length slightly steatotic liver without mass. Gallbladder is mildly distended with layering stones up to 1.6 cm but no wall thickening is seen or pericholecystic edema. The common bile duct is borderline prominent at 6.6 mm but this was seen previously and is unchanged. There is no appreciable ductal filling defect allowing for motion. Pancreas: There is nonlocalizing peripancreatic fluid, also seen on the prior study, extending into the perihepatic and perisplenic spaces and lesser sac and small amounts tracking along both paracolic gutters, and there is a moderately extensive peripancreatic edematous reaction consistent with acute pancreatitis. No pancreatic ductal  dilatation is seen. In September the edema was seen mainly along the body and tail of pancreas but is now present diffusely around the pancreas, with somewhat increased fluid. There is no focal hypoenhancement to  suspect necrosis, no peripancreatic hemorrhage is seen allowing for motion. There is no mass enhancement. Spleen:  Within normal limits in size and appearance. Adrenals/Urinary Tract: No adrenal/renal mass, hydronephrosis or perinephric fluid are seen. Stomach/Bowel: No dilatation or wall thickening in the visualized bowel including the appendix which is retrocecal. Vascular/Lymphatic: Mildly prominent peripancreatic lymph nodes up to 1.1 cm in short axis are redemonstrated. No other adenopathy. No abdominal aortic aneurysm. Other:  Motion degraded images. Musculoskeletal: No suspicious bone lesions identified. IMPRESSION: 1. Findings of acute pancreatitis without evidence of peripancreatic hemorrhage, pancreatic mass or necrosis or pancreatic ductal dilatation. There were similar findings on 12/18/2020 but today the peripancreatic edema is more diffuse and there is mild but nonlocalizing increased reactive fluid including around the liver, spleen and small amounts in the paracolic gutters. 2. Cholelithiasis and mild gallbladder distention, without evidence of wall thickening or inflammatory change. 3. Allowing for motion no extrahepatic ductal filling defect is seen with borderline prominent CBD unchanged and no intrahepatic biliary prominence. 4. Mildly prominent likely reactive peripancreatic lymph nodes. 5. Motion limited exam. Electronically Signed   By: Telford Nab M.D.   On: 03/26/2021 22:31   US ABDOMEN LIMITED RUQ (LIVER/GB)  Result Date: 03/26/2021 CLINICAL DATA:  Bilirubinemia EXAM: ULTRASOUND ABDOMEN LIMITED RIGHT UPPER QUADRANT COMPARISON:  Abdominal ultrasound 12/18/2020 there are multiple gallstones FINDINGS: Gallbladder: There are multiple gallstones measuring up to 1.0 cm. No  gallbladder wall thickening. Negative sonographic Murphy sign. Common bile duct: Diameter: 0.5 cm, within normal limits Liver: No focal lesion identified. Within normal limits in parenchymal echogenicity. Portal vein is patent on color Doppler imaging with normal direction of blood flow towards the liver. Other: None. IMPRESSION: Cholelithiasis without evidence of acute cholecystitis. Otherwise unremarkable sonographic exam of the right upper quadrant. Electronically Signed   By: Audie Pinto M.D.   On: 03/26/2021 19:23    Assessment and Plan: This is a 30 y.o. female with gallstone pancreatitis  --Discussed with the patient the possibly her episode in September was also related to gallstones rather than EtOH alone.  She denies any EtOH for this episode.  Discussed with her the role of the gallbladder and bile in digestion and how gallstones can cause biliary colic vs pancreatitis.  This episode of pancreatitis is more significant and there is more diffuse edema extending to the RUQ including some fluid around the inferior portion of the liver and both paracolic gutters.  This amount of inflammatory reaction is more severe compared to her CT scan back in September 2022.  She's still having pain this morning.  Discussed with her the potential for having to wait for the inflammation to subside prior to attempting cholecystectomy.  She may need interval cholecystectomy rather than cholecystectomy this admission.  Either way, I discussed with her that cholecystectomy is recommended regardless of the timing of it given her episode this admission, likely same etiology for her prior ER visit, and to prevent further recurrences.  She is in agreement. --For now, please keep her NPO with IV fluid hydration.  Repeat labs in AM.  Will continue with abdominal exams.  Depending on how she's doing tomorrow, may be able to to do cholecystectomy vs deferring for interval cholecystectomy.  I spent 80 minutes dedicated to  the care of this patient on the date of this encounter to include pre-visit review of records, face-to-face time with the patient discussing diagnosis and management, and any post-visit coordination of care.   Melvyn Neth, [facility]  Pg:  [phone]

## 2021-03-28 ENCOUNTER — Encounter
Admission: EM | Disposition: A | Discharge status: Home / Self Care | Payer: Self-pay | Source: Home / Self Care | Attending: Family Medicine

## 2021-03-28 LAB — COMPREHENSIVE METABOLIC PANEL
ALT: 256 U/L — ABNORMAL HIGH (ref 0–44)
AST: 61 U/L — ABNORMAL HIGH (ref 15–41)
Albumin: 3.3 g/dL — ABNORMAL LOW (ref 3.5–5.0)
Alkaline Phosphatase: 211 U/L — ABNORMAL HIGH (ref 38–126)
Anion gap: 10 (ref 5–15)
BUN: 7 mg/dL (ref 6–20)
CO2: 19 mmol/L — ABNORMAL LOW (ref 22–32)
Calcium: 8.9 mg/dL (ref 8.9–10.3)
Chloride: 105 mmol/L (ref 98–111)
Creatinine, Ser: 0.48 mg/dL (ref 0.44–1.00)
GFR, Estimated: >60 mL/min (ref >60)
Glucose, Bld: 62 mg/dL — ABNORMAL LOW (ref 70–99)
Potassium: 3.3 mmol/L — ABNORMAL LOW (ref 3.5–5.1)
Sodium: 134 mmol/L — ABNORMAL LOW (ref 135–145)
Total Bilirubin: 1.6 mg/dL — ABNORMAL HIGH (ref 0.3–1.2)
Total Protein: 6.8 g/dL (ref 6.5–8.1)

## 2021-03-28 LAB — CBC WITH DIFFERENTIAL/PLATELET
Abs Immature Granulocytes: 0.04 10*3/uL (ref 0.00–0.07)
Basophils Absolute: 0 10*3/uL (ref 0.0–0.1)
Basophils Relative: 0 %
Eosinophils Absolute: 0 10*3/uL (ref 0.0–0.5)
Eosinophils Relative: 0 %
HCT: 34.1 % — ABNORMAL LOW (ref 36.0–46.0)
Hemoglobin: 10.9 g/dL — ABNORMAL LOW (ref 12.0–15.0)
Immature Granulocytes: 0 %
Lymphocytes Relative: 17 %
Lymphs Abs: 1.8 10*3/uL (ref 0.7–4.0)
MCH: 25.3 pg — ABNORMAL LOW (ref 26.0–34.0)
MCHC: 32 g/dL (ref 30.0–36.0)
MCV: 79.3 fL — ABNORMAL LOW (ref 80.0–100.0)
Monocytes Absolute: 1 10*3/uL (ref 0.1–1.0)
Monocytes Relative: 9 %
Neutro Abs: 8.2 10*3/uL — ABNORMAL HIGH (ref 1.7–7.7)
Neutrophils Relative %: 74 %
Platelets: 300 10*3/uL (ref 150–400)
RBC: 4.3 MIL/uL (ref 3.87–5.11)
RDW: 16.1 % — ABNORMAL HIGH (ref 11.5–15.5)
WBC: 11 10*3/uL — ABNORMAL HIGH (ref 4.0–10.5)
nRBC: 0 % (ref 0.0–0.2)

## 2021-03-28 LAB — ANTI-SMOOTH MUSCLE ANTIBODY, IGG: F-Actin IgG: 7 Units (ref 0–19)

## 2021-03-28 LAB — CERULOPLASMIN: Ceruloplasmin: 36.5 mg/dL (ref 19.0–39.0)

## 2021-03-28 LAB — MITOCHONDRIAL ANTIBODIES: Mitochondrial M2 Ab, IgG: <20 Units (ref 0.0–20.0)

## 2021-03-28 SURGERY — CHOLECYSTECTOMY, ROBOT-ASSISTED, LAPAROSCOPIC
Anesthesia: Choice

## 2021-03-28 MED ORDER — POTASSIUM CHLORIDE 20 MEQ PO PACK
40.0000 meq | PACK | Freq: Once | ORAL | Status: AC
  Administered 2021-03-28: 10:00:00 40 meq via ORAL
  Filled 2021-03-28: qty 2

## 2021-03-28 NOTE — Progress Notes (Signed)
[facility] SURGICAL PROGRESS NOTE (cpt [phone])  Hospital Day(s): 2.   Interval History: Patient seen and examined, no acute events or new complaints overnight. Patient reports she continues to have abdominal pain, not significantly improved. No fever, chills, nausea, emesis. Her leukocytosis did improve this morning to 11.0K (from 14.6K). Hgb to 10.9; likely dilutional. Renal function remains normal; sCr - 0.48; UO - unmeasured. Hypokalemia to 3.3. LFTs and bilirubin are all improving. She is NPO currently.   Review of Systems:  Constitutional: denies fever, chills  HEENT: denies cough or congestion  Respiratory: denies any shortness of breath  Cardiovascular: denies chest pain or palpitations  Gastrointestinal: + abdominal pain, denied N/V Genitourinary: denies burning with urination or urinary frequency  Vital signs in last 24 hours: [min-max] current  Temp:  [98.8 F (37.1 C)-99.6 F (37.6 C)] 99.6 F (37.6 C) (12/20 0303) Pulse Rate:  [91-109] 99 (12/20 0303) Resp:  [16-18] 16 (12/20 0303) BP: (121-159)/(79-114) 133/79 (12/20 0303) SpO2:  [92 %-100 %] 99 % (12/20 0303)     Height: 5\' 7"  (170.2 cm) Weight: 127 kg BMI (Calculated): 43.84   Intake/Output last 2 shifts:  No intake/output data recorded.   Physical Exam:  Constitutional: alert, cooperative and no distress  HENT: normocephalic without obvious abnormality  Eyes: PERRL, EOM's grossly intact and symmetric  Respiratory: breathing non-labored at rest  Cardiovascular: regular rate and sinus rhythm  Gastrointestinal: Soft, she remains tender to palpation across her upper abdomen, without significant improvements, non-distended, no rebound/guarding.  Musculoskeletal: no edema or wounds, motor and sensation grossly intact, NT    Labs:  CBC Latest Ref Rng & Units 03/28/2021 03/27/2021 03/26/2021  WBC 4.0 - 10.5 K/uL 11.0(H) 14.6(H) 11.5(H)  Hemoglobin 12.0 - 15.0 g/dL 10.9(L) 12.0 13.6  Hematocrit 36.0 -  46.0 % 34.1(L) 37.4 42.7  Platelets 150 - 400 K/uL 300 306 351   CMP Latest Ref Rng & Units 03/28/2021 03/27/2021 03/26/2021  Glucose 70 - 99 mg/dL 62(L) 75 107(H)  BUN 6 - 20 mg/dL 7 7 10   Creatinine 0.44 - 1.00 mg/dL 0.48 0.51 0.67  Sodium 135 - 145 mmol/L 134(L) 134(L) 137  Potassium 3.5 - 5.1 mmol/L 3.3(L) 3.4(L) 3.0(L)  Chloride 98 - 111 mmol/L 105 102 106  CO2 22 - 32 mmol/L 19(L) 24 23  Calcium 8.9 - 10.3 mg/dL 8.9 8.9 9.2  Total Protein 6.5 - 8.1 g/dL 6.8 6.8 7.9  Total Bilirubin 0.3 - 1.2 mg/dL 1.6(H) 2.2(H) 4.1(H)  Alkaline Phos 38 - 126 U/L 211(H) 244(H) 281(H)  AST 15 - 41 U/L 61(H) 163(H) 356(H)  ALT 0 - 44 U/L 256(H) 417(H) 562(H)     Imaging studies: No new pertinent imaging studies   Assessment/Plan: (ICD-10's: K85.10) 30 y.o. female with persistent abdominal pain but improvement in her LFTs, bilirubin level, and leukocytosis, admitted with gallstone pancreatitis.    - Unfortunately, she continues to have fairly significant abdominal pain consistent with degree of inflammation seen on imaging. As such, we will forgo any surgical intervention today. At some point, ether this admission or potentially as an outpatient, she will benefit from cholecystectomy to prevent further recurrences. Timing per Dr Kirke Corin.     - She can have CLD for comfort; NPO if pain worsens   - Continue IVF support  - Monitor leukocytosis; Improving  - Monitor LFTs; improved  - Monitor abdominal examination  - Pain control prn; antiemetics prn - Mobilization as tolerated - Further management per primary service; we will follow  All of the above findings and recommendations were discussed with the patient, and the medical team, and all of patient's questions were answered to her expressed satisfaction.  -- Lynden Oxford, PA-C [facility] 03/28/2021, 7:43 AM [phone] M-F: 7am - 4pm

## 2021-03-28 NOTE — Progress Notes (Signed)
Mobility Specialist - Progress Note   03/28/21 1300  Mobility  Activity Refused mobility  Mobility performed by Mobility specialist     Pt resting in bed upon arrival. Politely declined mobility, no reason specified. Pt reports she has been up and ambulating without issue, mild pain but improved since admission.    Ashley Howell Mobility Specialist 03/28/21, 1:35 PM

## 2021-03-28 NOTE — Progress Notes (Signed)
PROGRESS NOTE    KHILA PAPP  OAC:[id number] DOB: September 25, 1990 DOA: 03/26/2021 PCP: [facility] [place]   Brief Narrative:  Ashley Howell is a 30 y.o. African-American female with medical history significant for hypertension and ongoing tobacco abuse, who presented to the ER with acute onset of upper abdominal pain and substernal chest pain that started yesterday with associated vomiting without bilious vomitus or hematemesis.  She denies any fever or chills.  She came to the ER yesterday and was in significant pain and could not wait.  She has been having almost no p.o. intake since yesterday.  When she came to the ER her lipase level was 8036 yesterday and it was down to 359 today.  Her total bili was 3.4 and came up to 4.1 and alk phos 194 and came up to 281 on her AST and ALT decreased.  She denies any diarrhea or constipation, melena or bright red blood per rectum.  No dyspnea or cough or wheezing.  She did not notice jaundice.  No dysuria, oliguria or hematuria or flank pain.   ED Course: When she came to the ER, blood pressure was 146/104 with a heart rate of 107 with otherwise normal vital signs.  Labs revealed hypokalemia of 3 and alk phos of 281 compared to 194 lipase of 359 compared to 8036, AST of 356 down from 1163 and ALT 562 down from 699 with a total bili of 4.1 up from 3.4 yesterday.  LDH was 292.  Lipid profile showed triglycerides of 101.  Lactic acid was 1 and later 0.7 and procalcitonin less than 0.1.  Her CBC showed leukocytosis of 11.5 compared to 9.7 yesterday and microcytosis.  Urine pregnancy test was negative.  Wet prep and GC and chlamydia came back negative. EKG as reviewed by me : EKG showed sinus tachycardia with a rate of 114.  Assessment & Plan:   Active Problems:   Essential hypertension   Acute gallstone pancreatitis  Acute gallstone pancreatitis: S/p MRCP which did not show intrahepatic biliary prominence or any blockage.  She was  seen by GI, per them, no intervention such as ERCP is indicated.  They recommended cholecystectomy.  General surgery saw this patient, they recommended delayed cholecystectomy as outpatient but have cleared the patient to have clears for now and to de-escalate diet if she were to have pain.  On my examination, she has very minimal epigastric tenderness and she states that her pain is very well controlled.  Elevated LFTs: Likely secondary to CBD stone which is likely passing now that LFTs are improving.  Monitor daily.  Hypokalemia: Low again, will replace.  Essential hypertension: Controlled.  Continue amlodipine.   DVT prophylaxis:    Code Status: Full Code  Family Communication:  None present at bedside.  Plan of care discussed with patient in length and he verbalized understanding and agreed with it.  Status is: Inpatient  Remains inpatient appropriate because: Acute pancreatitis needs inpatient management.  Estimated body mass index is 43.85 kg/m as calculated from the following:   Height as of this encounter: '5\' 7"'  (1.702 m).   Weight as of this encounter: 127 kg.  Nutritional Assessment: Body mass index is 43.85 kg/m.Marland Kitchen Seen by dietician.  I agree with the assessment and plan as outlined below: Nutrition Status:   Skin Assessment: I have examined the patient's skin and I agree with the wound assessment as performed by the wound care RN as outlined below:    Consultants:  GI  General surgery  Procedures:  None  Antimicrobials:  Anti-infectives (From admission, onward)    None          Subjective: Seen and examined.  Very minimal abdominal pain which is improved with the pain medications.  No other complaint.  Objective: Vitals:   03/27/21 1702 03/27/21 2017 03/27/21 2206 03/28/21 0303  BP: (!) 149/114 (!) 152/91 (!) 154/85 133/79  Pulse: (!) 106 (!) 108 92 99  Resp: '18 18 16 16  ' Temp: 99.1 F (37.3 C) 98.8 F (37.1 C) 99.4 F (37.4 C) 99.6 F (37.6 C)   TempSrc: Oral Oral Oral Oral  SpO2: 100% 98% 98% 99%  Weight:      Height:       No intake or output data in the 24 hours ending 03/28/21 0745  Filed Weights   03/26/21 1525 03/26/21 1553  Weight: 127 kg 127 kg    Examination:  General exam: Appears calm and comfortable, obese Respiratory system: Clear to auscultation. Respiratory effort normal. Cardiovascular system: S1 & S2 heard, RRR. No JVD, murmurs, rubs, gallops or clicks. No pedal edema. Gastrointestinal system: Abdomen is nondistended, soft and very mild epigastric tenderness, no organomegaly or masses felt. Normal bowel sounds heard. Central nervous system: Alert and oriented. No focal neurological deficits. Extremities: Symmetric 5 x 5 power. Skin: No rashes, lesions or ulcers.  Psychiatry: Judgement and insight appear normal. Mood & affect appropriate.    Data Reviewed: I have personally reviewed following labs and imaging studies  CBC: Recent Labs  Lab 03/25/21 1733 03/26/21 1615 03/27/21 0525 03/28/21 0454  WBC 9.7 11.5* 14.6* 11.0*  NEUTROABS  --  9.3*  --  8.2*  HGB 13.1 13.6 12.0 10.9*  HCT 42.2 42.7 37.4 34.1*  MCV 79.8* 79.8* 78.7* 79.3*  PLT 380 351 306 333    Basic Metabolic Panel: Recent Labs  Lab 03/25/21 1733 03/26/21 1615 03/27/21 0525 03/28/21 0454  NA 137 137 134* 134*  K 3.6 3.0* 3.4* 3.3*  CL 107 106 102 105  CO2 '24 23 24 ' 19*  GLUCOSE 99 107* 75 62*  BUN '10 10 7 7  ' CREATININE 0.73 0.67 0.51 0.48  CALCIUM 9.1 9.2 8.9 8.9    GFR: Estimated Creatinine Clearance: 142.5 mL/min (by C-G formula based on SCr of 0.48 mg/dL). Liver Function Tests: Recent Labs  Lab 03/25/21 1733 03/26/21 1615 03/27/21 0525 03/28/21 0454  AST 1,163* 356* 163* 61*  ALT 699* 562* 417* 256*  ALKPHOS 194* 281* 244* 211*  BILITOT 3.4* 4.1* 2.2* 1.6*  PROT 7.9 7.9 6.8 6.8  ALBUMIN 4.0 4.0 3.4* 3.3*    Recent Labs  Lab 03/25/21 1733 03/26/21 1615 03/27/21 0934  LIPASE 8,036* 359* 67*    No  results for input(s): AMMONIA in the last 168 hours. Coagulation Profile: No results for input(s): INR, PROTIME in the last 168 hours. Cardiac Enzymes: No results for input(s): CKTOTAL, CKMB, CKMBINDEX, TROPONINI in the last 168 hours. BNP (last 3 results) No results for input(s): PROBNP in the last 8760 hours. HbA1C: No results for input(s): HGBA1C in the last 72 hours. CBG: No results for input(s): GLUCAP in the last 168 hours. Lipid Profile: Recent Labs    03/26/21 1615 03/27/21 0934  CHOL 166  --   HDL 50  --   LDLCALC 101*  --   TRIG 75 84  CHOLHDL 3.3  --     Thyroid Function Tests: No results for input(s): TSH, T4TOTAL, FREET4, T3FREE, THYROIDAB in the last  72 hours. Anemia Panel: Recent Labs    03/27/21 0934  FERRITIN 32  TIBC 419  IRON 26*   Sepsis Labs: Recent Labs  Lab 03/26/21 1615 03/26/21 1729  PROCALCITON <0.10  --   LATICACIDVEN 1.0 0.7     Recent Results (from the past 240 hour(s))  Chlamydia/NGC rt PCR ([place] only)     Status: None   Collection Time: 03/26/21  5:29 PM   Specimen: Cervical/Vaginal swab; GU  Result Value Ref Range Status   Specimen source GC/Chlam ENDOCERVICAL  Final   Chlamydia Tr NOT DETECTED NOT DETECTED Final   N gonorrhoeae NOT DETECTED NOT DETECTED Final    Comment: (NOTE) This CT/NG assay has not been evaluated in patients with a history of  hysterectomy. Performed at [facility], [address] [place], [place] [address]   Wet prep, genital     Status: None   Collection Time: 03/26/21  5:29 PM   Specimen: Cervical/Vaginal swab  Result Value Ref Range Status   Yeast Wet Prep HPF POC NONE SEEN NONE SEEN Final   Trich, Wet Prep NONE SEEN NONE SEEN Final   Clue Cells Wet Prep HPF POC NONE SEEN NONE SEEN Final   WBC, Wet Prep HPF POC <10 <10 Final   Sperm PRESENT  Final    Comment: Performed at [facility], [address], [place], [place] [address]  Resp Panel by RT-PCR (Flu A&B, Covid)  Nasopharyngeal Swab     Status: None   Collection Time: 03/26/21  6:09 PM   Specimen: Nasopharyngeal Swab; Nasopharyngeal(NP) swabs in vial transport medium  Result Value Ref Range Status   SARS Coronavirus 2 by RT PCR NEGATIVE NEGATIVE Final    Comment: (NOTE) SARS-CoV-2 target nucleic acids are NOT DETECTED.  The SARS-CoV-2 RNA is generally detectable in upper respiratory specimens during the acute phase of infection. The lowest concentration of SARS-CoV-2 viral copies this assay can detect is 138 copies/mL. A negative result does not preclude SARS-Cov-2 infection and should not be used as the sole basis for treatment or other patient management decisions. A negative result may occur with  improper specimen collection/handling, submission of specimen other than nasopharyngeal swab, presence of viral mutation(s) within the areas targeted by this assay, and inadequate number of viral copies(<138 copies/mL). A negative result must be combined with clinical observations, patient history, and epidemiological information. The expected result is Negative.  Fact Sheet for Patients:  EntrepreneurPulse.com.au  Fact Sheet for Healthcare Providers:  IncredibleEmployment.be  This test is no t yet approved or cleared by the [place] FDA and  has been authorized for detection and/or diagnosis of SARS-CoV-2 by FDA under an Emergency Use Authorization (EUA). This EUA will remain  in effect (meaning this test can be used) for the duration of the COVID-19 declaration under Section 564(b)(1) of the Act, 21 U.S.C.section 360bbb-3(b)(1), unless the authorization is terminated  or revoked sooner.       Influenza A by PCR NEGATIVE NEGATIVE Final   Influenza B by PCR NEGATIVE NEGATIVE Final    Comment: (NOTE) The Xpert Xpress SARS-CoV-2/FLU/RSV plus assay is intended as an aid in the diagnosis of influenza from Nasopharyngeal swab specimens and should not be  used as a sole basis for treatment. Nasal washings and aspirates are unacceptable for Xpert Xpress SARS-CoV-2/FLU/RSV testing.  Fact Sheet for Patients: EntrepreneurPulse.com.au  Fact Sheet for Healthcare Providers: IncredibleEmployment.be  This test is not yet approved or cleared by the [place] FDA and has  been authorized for detection and/or diagnosis of SARS-CoV-2 by FDA under an Emergency Use Authorization (EUA). This EUA will remain in effect (meaning this test can be used) for the duration of the COVID-19 declaration under Section 564(b)(1) of the Act, 21 U.S.C. section 360bbb-3(b)(1), unless the authorization is terminated or revoked.  Performed at [facility] [address], [place], [place] [address]        Radiology Studies: CT ABDOMEN PELVIS W CONTRAST  Result Date: 03/26/2021 CLINICAL DATA:  Left upper quadrant pain, diagnosis pancreatitis several months ago and patient states feels similar. EXAM: CT ABDOMEN AND PELVIS WITH CONTRAST TECHNIQUE: Multidetector CT imaging of the abdomen and pelvis was performed using the standard protocol following bolus administration of intravenous contrast. CONTRAST:  174m OMNIPAQUE IOHEXOL 300 MG/ML  SOLN COMPARISON:  MRI abdomen 12/18/2020 FINDINGS: Lower chest: No acute abnormality. Hepatobiliary: No focal liver abnormality is seen. Probable sludge in the gallbladder. No gallbladder wall thickening. No intra or extrahepatic biliary duct dilation. Pancreas: The pancreas appears edematous with marked peripancreatic fat stranding. There is no hypoattenuation to suggest necrosis. No central duct dilation. No focal peripancreatic fluid collection. Spleen: Normal in size without focal abnormality. Adrenals/Urinary Tract: Adrenal glands are unremarkable. Kidneys are normal, without renal calculi, focal lesion, or hydronephrosis. Bladder is unremarkable. Stomach/Bowel: Stomach is within normal  limits. Appendix appears normal. No evidence of bowel wall thickening, distention, or inflammatory changes. Vascular/Lymphatic: No significant vascular findings are present. Mildly enlarged peripancreatic lymph nodes, likely reactive for example series 2, image 22 measuring 1.1 cm in short axis. Reproductive: Uterus and bilateral adnexa are unremarkable. Other: Moderate free fluid in the pelvis, likely reactive. No abdominal wall hernia. Musculoskeletal: No acute or significant osseous findings. IMPRESSION: 1. Findings consistent with acute pancreatitis. No evidence of pancreatic necrosis or focal peripancreatic fluid collection. 2. Moderate free fluid in the pelvis, likely reactive. 3. Mildly enlarged peripancreatic lymph nodes, likely reactive. Electronically Signed   By: NAudie PintoM.D.   On: 03/26/2021 18:14   MR ABDOMEN MRCP W WO CONTAST  Result Date: 03/26/2021 CLINICAL DATA:  Cholelithiasis and pancreatitis. EXAM: MRI ABDOMEN WITHOUT AND WITH CONTRAST (INCLUDING MRCP) TECHNIQUE: Multiplanar multisequence MR imaging of the abdomen was performed both before and after the administration of intravenous contrast. Heavily T2-weighted images of the biliary and pancreatic ducts were obtained, and three-dimensional MRCP images were rendered by post processing. CONTRAST:  14mGADAVIST GADOBUTROL 1 MMOL/ML IV SOLN COMPARISON:  CT with IV contrast today, MRI/MRCP 12/18/2020. FINDINGS: Patient motion variably limits all sequences, but particularly limits the postcontrast images and MRCP sequences. Lower chest: No acute findings. Hepatobiliary: 17 cm in length slightly steatotic liver without mass. Gallbladder is mildly distended with layering stones up to 1.6 cm but no wall thickening is seen or pericholecystic edema. The common bile duct is borderline prominent at 6.6 mm but this was seen previously and is unchanged. There is no appreciable ductal filling defect allowing for motion. Pancreas: There is  nonlocalizing peripancreatic fluid, also seen on the prior study, extending into the perihepatic and perisplenic spaces and lesser sac and small amounts tracking along both paracolic gutters, and there is a moderately extensive peripancreatic edematous reaction consistent with acute pancreatitis. No pancreatic ductal dilatation is seen. In September the edema was seen mainly along the body and tail of pancreas but is now present diffusely around the pancreas, with somewhat increased fluid. There is no focal hypoenhancement to suspect necrosis, no peripancreatic hemorrhage is seen allowing for motion. There  is no mass enhancement. Spleen:  Within normal limits in size and appearance. Adrenals/Urinary Tract: No adrenal/renal mass, hydronephrosis or perinephric fluid are seen. Stomach/Bowel: No dilatation or wall thickening in the visualized bowel including the appendix which is retrocecal. Vascular/Lymphatic: Mildly prominent peripancreatic lymph nodes up to 1.1 cm in short axis are redemonstrated. No other adenopathy. No abdominal aortic aneurysm. Other:  Motion degraded images. Musculoskeletal: No suspicious bone lesions identified. IMPRESSION: 1. Findings of acute pancreatitis without evidence of peripancreatic hemorrhage, pancreatic mass or necrosis or pancreatic ductal dilatation. There were similar findings on 12/18/2020 but today the peripancreatic edema is more diffuse and there is mild but nonlocalizing increased reactive fluid including around the liver, spleen and small amounts in the paracolic gutters. 2. Cholelithiasis and mild gallbladder distention, without evidence of wall thickening or inflammatory change. 3. Allowing for motion no extrahepatic ductal filling defect is seen with borderline prominent CBD unchanged and no intrahepatic biliary prominence. 4. Mildly prominent likely reactive peripancreatic lymph nodes. 5. Motion limited exam. Electronically Signed   By: Telford Nab M.D.   On:  03/26/2021 22:31   US ABDOMEN LIMITED RUQ (LIVER/GB)  Result Date: 03/26/2021 CLINICAL DATA:  Bilirubinemia EXAM: ULTRASOUND ABDOMEN LIMITED RIGHT UPPER QUADRANT COMPARISON:  Abdominal ultrasound 12/18/2020 there are multiple gallstones FINDINGS: Gallbladder: There are multiple gallstones measuring up to 1.0 cm. No gallbladder wall thickening. Negative sonographic Murphy sign. Common bile duct: Diameter: 0.5 cm, within normal limits Liver: No focal lesion identified. Within normal limits in parenchymal echogenicity. Portal vein is patent on color Doppler imaging with normal direction of blood flow towards the liver. Other: None. IMPRESSION: Cholelithiasis without evidence of acute cholecystitis. Otherwise unremarkable sonographic exam of the right upper quadrant. Electronically Signed   By: Audie Pinto M.D.   On: 03/26/2021 19:23    Scheduled Meds:  amLODipine  5 mg Oral Daily   enoxaparin (LOVENOX) injection  0.5 mg/kg Subcutaneous Q24H   potassium chloride  40 mEq Oral Once   Continuous Infusions:  0.9 % NaCl with KCl 20 mEq / L 100 mL/hr at 03/27/21 1844     LOS: 2 days   Time spent: 27 minutes   Darliss Cheney, MD [facility]  03/28/2021, 7:45 AM  Please page via [place] and do not message via secure chat for anything urgent. Secure chat can be used for anything non urgent.  How to contact the [facility] Attending or Consulting provider [place] or covering provider during after hours [place], for this patient?  Check the care team in [facility] and look for a) attending/consulting TR[facility] provider listed and b) the [facility] team listed. Page or secure chat [phone]. Log into [email or web address] and use [place]'s universal password to access. If you do not have the password, please contact the hospital operator. Locate the [facility] provider you are looking for under [facility] and page to a number that you can be directly reached. If you still have difficulty reaching the provider, please page the  [facility] (Director on Call) for the Hospitalists listed on amion for assistance.

## 2021-03-29 DIAGNOSIS — E876 Hypokalemia: Secondary | ICD-10-CM

## 2021-03-29 DIAGNOSIS — R7989 Other specified abnormal findings of blood chemistry: Secondary | ICD-10-CM

## 2021-03-29 LAB — COMPREHENSIVE METABOLIC PANEL
ALT: 166 U/L — ABNORMAL HIGH (ref 0–44)
AST: 29 U/L (ref 15–41)
Albumin: 3 g/dL — ABNORMAL LOW (ref 3.5–5.0)
Alkaline Phosphatase: 175 U/L — ABNORMAL HIGH (ref 38–126)
Anion gap: 4 — ABNORMAL LOW (ref 5–15)
BUN: 7 mg/dL (ref 6–20)
CO2: 24 mmol/L (ref 22–32)
Calcium: 8.4 mg/dL — ABNORMAL LOW (ref 8.9–10.3)
Chloride: 107 mmol/L (ref 98–111)
Creatinine, Ser: 0.43 mg/dL — ABNORMAL LOW (ref 0.44–1.00)
GFR, Estimated: >60 mL/min (ref >60)
Glucose, Bld: 93 mg/dL (ref 70–99)
Potassium: 3.5 mmol/L (ref 3.5–5.1)
Sodium: 135 mmol/L (ref 135–145)
Total Bilirubin: 0.8 mg/dL (ref 0.3–1.2)
Total Protein: 6.4 g/dL — ABNORMAL LOW (ref 6.5–8.1)

## 2021-03-29 MED ORDER — OXYCODONE HCL 5 MG PO TABS
5.0000 mg | ORAL_TABLET | ORAL | Status: DC | PRN
  Administered 2021-03-29 – 2021-03-30 (×2): 5 mg via ORAL
  Filled 2021-03-29 (×2): qty 1

## 2021-03-29 NOTE — Discharge Instructions (Addendum)
° °  Diet: Recommend avoiding or limiting fatty/greasy foods over the next few weeks until surgery. This will help limit the chance of recurrence.  Call office [phone] / [phone]) at any time if any questions, worsening pain, fevers/chills, bleeding, drainage from incision site, or other concerns.

## 2021-03-29 NOTE — Plan of Care (Signed)

## 2021-03-29 NOTE — Progress Notes (Signed)
[facility] SURGICAL PROGRESS NOTE (cpt [phone])  Hospital Day(s): 3.   Interval History: Patient seen and examined, no acute events or new complaints overnight. Patient reports this morning she continue to feel better compared to admission. She does appear to be taking her 0.5mg  every 3-4 hours. She states "she can tolerate the pain without the medication, but doesn't want to be in pain." No fever, chills, nausea, emesis. Renal function remains normal; sCr - 0.43; UO - unmeasured. Hypokalemia to 3.3. LFTs and bilirubin are all improving. She is on CLD; tolerating well.   Review of Systems:  Constitutional: denies fever, chills  HEENT: denies cough or congestion  Respiratory: denies any shortness of breath  Cardiovascular: denies chest pain or palpitations  Gastrointestinal: + abdominal pain (improved), denied N/V Genitourinary: denies burning with urination or urinary frequency  Vital signs in last 24 hours: [min-max] current  Temp:  [98.5 F (36.9 C)-98.7 F (37.1 C)] 98.6 F (37 C) (12/21 0422) Pulse Rate:  [79-84] 84 (12/21 0422) Resp:  [16-21] 21 (12/21 0422) BP: (129-142)/(77-80) 139/80 (12/21 0422) SpO2:  [98 %-100 %] 98 % (12/21 0422)     Height: 5\' 7"  (170.2 cm) Weight: 127 kg BMI (Calculated): 43.84   Intake/Output last 2 shifts:  12/20 0701 - 12/21 0700 In: 3625.1 [I.V.:3625.1] Out: -    Physical Exam:  Constitutional: alert, cooperative and no distress  HENT: normocephalic without obvious abnormality  Eyes: PERRL, EOM's grossly intact and symmetric  Respiratory: breathing non-labored at rest  Cardiovascular: regular rate and sinus rhythm  Gastrointestinal: Soft, upper abdominal tenderness improved, non-distended, no rebound/guarding.  Musculoskeletal: no edema or wounds, motor and sensation grossly intact, NT    Labs:  CBC Latest Ref Rng & Units 03/28/2021 03/27/2021 03/26/2021  WBC 4.0 - 10.5 K/uL 11.0(H) 14.6(H) 11.5(H)  Hemoglobin 12.0 - 15.0  g/dL 10.9(L) 12.0 13.6  Hematocrit 36.0 - 46.0 % 34.1(L) 37.4 42.7  Platelets 150 - 400 K/uL 300 306 351   CMP Latest Ref Rng & Units 03/29/2021 03/28/2021 03/27/2021  Glucose 70 - 99 mg/dL 93 87(O) 75  BUN 6 - 20 mg/dL 7 7 7   Creatinine 0.44 - 1.00 mg/dL 6.76(H) 2.09 4.70  Sodium 135 - 145 mmol/L 135 134(L) 134(L)  Potassium 3.5 - 5.1 mmol/L 3.5 3.3(L) 3.4(L)  Chloride 98 - 111 mmol/L 107 105 102  CO2 22 - 32 mmol/L 24 19(L) 24  Calcium 8.9 - 10.3 mg/dL 9.6(G) 8.9 8.9  Total Protein 6.5 - 8.1 g/dL 6.4(L) 6.8 6.8  Total Bilirubin 0.3 - 1.2 mg/dL 0.8 8.3(M) 2.2(H)  Alkaline Phos 38 - 126 U/L 175(H) 211(H) 244(H)  AST 15 - 41 U/L 29 61(H) 163(H)  ALT 0 - 44 U/L 166(H) 256(H) 417(H)     Imaging studies: No new pertinent imaging studies   Assessment/Plan: (ICD-10's: K85.10) 30 y.o. female with persistent abdominal pain but improvement in her LFTs, bilirubin level, and leukocytosis, admitted with gallstone pancreatitis.    - She does have clinical improvement on her abdominal examination; however, she has continued to require/take 0.5 mg of Dilaudid every 3-4 hours, which may be masking her overall pain. She did have significant inflammatory changes on imaging at time of admission and symptoms have persisted now for ~1 week. As such, we will defer any interval cholecystectomy to the outpatient setting and plan for interval cholecystectomy in 6-8 weeks.     - Advance to full liquids this morning; low fat diet for dinner if doing well. We did  review dietary recommendations for home to help limit recurrence; update this in her DC instructions as well  - Wean from IVF support as diet advances  - Monitor LFTs; improved  - Monitor abdominal examination  - Pain control prn; discussed spacing out/limiting narcotic pain medications - Antiemetics prn - Mobilization as tolerated - Further management per primary service; we will follow    - Discharge Planning: Will trial her on advancement of  diet today. If she can tolerate advancement and continue to have clinical improvement without significant need for narcotic pain medications, she may be ready for discharge home tomorrow (12/22).  All of the above findings and recommendations were discussed with the patient, and the medical team, and all of patient's questions were answered to her expressed satisfaction.  -- Lynden Oxford, PA-C [facility] 03/29/2021, 8:16 AM [phone] M-F: 7am - 4pm

## 2021-03-29 NOTE — Clinical Social Work Note (Signed)
°  Transition of Care (TOC) Screening Note   Patient Details  Name: Ashley Howell Date of Birth: Mar 29, 1991   Transition of Care [facility]) CM/SW Contact:    Maree Krabbe, LCSW Phone [phone] 03/29/2021, 1:22 PM    Transition of Care Department [facility]) has reviewed patient and no TOC needs have been identified at this time. We will continue to monitor patient advancement through interdisciplinary progression rounds. If new patient transition needs arise, please place a TOC consult.

## 2021-03-29 NOTE — Progress Notes (Signed)
Patient ID: Ashley Howell, female   DOB: 1990-04-16, 30 y.o.   MRN: [id number] [facility] PROGRESS NOTE  Ashley Howell YIF:[id number] DOB: July 26, 1990 DOA: 03/26/2021 PCP: [facility]  HPI/Subjective: Patient still having some abdominal pain.  No nausea or vomiting.  Advance to full liquid diet at this afternoon soft diet.  Admitted with gallstone pancreatitis.  Objective: Vitals:   03/29/21 0859 03/29/21 1626  BP: 129/74 127/82  Pulse: 80 90  Resp: 18 18  Temp: 97.6 F (36.4 C) 97.9 F (36.6 C)  SpO2: 100% 100%    Intake/Output Summary (Last 24 hours) at 03/29/2021 1634 Last data filed at 03/29/2021 1600 Gross per 24 hour  Intake 2978.49 ml  Output --  Net 2978.49 ml   Filed Weights   03/26/21 1525 03/26/21 1553  Weight: 127 kg 127 kg    ROS: Review of Systems  Respiratory:  Negative for shortness of breath.   Cardiovascular:  Negative for chest pain.  Gastrointestinal:  Negative for abdominal pain, nausea and vomiting.  Exam: Physical Exam HENT:     Head: Normocephalic.     Mouth/Throat:     Pharynx: No oropharyngeal exudate.  Eyes:     General: Lids are normal.     Conjunctiva/sclera: Conjunctivae normal.  Cardiovascular:     Rate and Rhythm: Normal rate and regular rhythm.     Heart sounds: Normal heart sounds, S1 normal and S2 normal.  Pulmonary:     Breath sounds: No decreased breath sounds, wheezing, rhonchi or rales.  Abdominal:     Palpations: Abdomen is soft.     Tenderness: There is abdominal tenderness in the epigastric area and left upper quadrant.  Musculoskeletal:     Right lower leg: No swelling.     Left lower leg: No swelling.  Skin:    General: Skin is warm.     Findings: No rash.  Neurological:     Mental Status: She is alert and oriented to person, place, and time.      Scheduled Meds:  amLODipine  5 mg Oral Daily   enoxaparin (LOVENOX) injection  0.5 mg/kg Subcutaneous Q24H   Continuous  Infusions:  0.9 % NaCl with KCl 20 mEq / L 100 mL/hr at 03/29/21 1040    Assessment/Plan:  Gallstone pancreatitis.  General surgery advance diet to soft diet today.  If still doing better may be discharged tomorrow but would still need outpatient cholecystectomy. Elevated liver function test improving.  Total bilirubin normal range.  AST normalized down to 29.  ALT still slightly elevated at 166 and improved from 699.  MRCP did not show a stone in the ducts. Essential hypertension on amlodipine Hypokalemia.  On potassium and IV fluids.      Code Status:     Code Status Orders  (From admission, onward)           Start     Ordered   03/26/21 2039  Full code  Continuous        03/26/21 2042           Code Status History     Date Active Date Inactive Code Status Order ID Comments User Context   10/06/2020 1242 10/08/2020 2052 Full Code [id number]  Natale Milch, MD Inpatient   09/08/2020 1730 09/09/2020 0133 Full Code [id number]  Vena Austria, MD Inpatient   07/03/2020 1424 07/03/2020 2014 Full Code [id number]  Mirna Mires, CNM Inpatient   10/09/2018 1020 10/11/2018 1926 Full Code  [id number]  Natale Milch, MD Inpatient   10/06/2018 1037 10/06/2018 1609 Full Code [id number]  [name], CNM Inpatient   09/25/2018 2255 09/26/2018 0429 Full Code [id number]  Conard Novak, MD Inpatient   08/21/2018 2200 08/22/2018 0734 Full Code [id number]  Nadara Mustard, MD Inpatient   07/28/2018 1147 07/28/2018 1618 Full Code [id number]  [name], CNM Inpatient   06/29/2018 0012 06/29/2018 0422 Full Code [id number]  Nadara Mustard, MD Inpatient   02/22/2015 1200 02/25/2015 1643 Full Code [id number]  Nadara Mustard, MD Inpatient   02/22/2015 0646 02/22/2015 1200 Full Code [id number]  Kennith Gain, RN Inpatient   02/15/2015 1222 02/15/2015 2002 Full Code [id number]  Farrel Conners, CNM Inpatient   12/08/2014 1810 12/08/2014 2202 Full Code [id number]  Vale Haven, RN Inpatient    11/12/2014 1320 11/12/2014 1941 Full Code [id number]  Vale Haven, RN Inpatient      Disposition Plan: Status is: Inpatient  Ashley Howell [facility]  [facility]

## 2021-03-30 DIAGNOSIS — D509 Iron deficiency anemia, unspecified: Secondary | ICD-10-CM

## 2021-03-30 LAB — COMPREHENSIVE METABOLIC PANEL
ALT: 118 U/L — ABNORMAL HIGH (ref 0–44)
AST: 24 U/L (ref 15–41)
Albumin: 3.1 g/dL — ABNORMAL LOW (ref 3.5–5.0)
Alkaline Phosphatase: 159 U/L — ABNORMAL HIGH (ref 38–126)
Anion gap: 4 — ABNORMAL LOW (ref 5–15)
BUN: 8 mg/dL (ref 6–20)
CO2: 24 mmol/L (ref 22–32)
Calcium: 8.9 mg/dL (ref 8.9–10.3)
Chloride: 109 mmol/L (ref 98–111)
Creatinine, Ser: 0.53 mg/dL (ref 0.44–1.00)
GFR, Estimated: >60 mL/min (ref >60)
Glucose, Bld: 101 mg/dL — ABNORMAL HIGH (ref 70–99)
Potassium: 3.5 mmol/L (ref 3.5–5.1)
Sodium: 137 mmol/L (ref 135–145)
Total Bilirubin: 0.5 mg/dL (ref 0.3–1.2)
Total Protein: 6.6 g/dL (ref 6.5–8.1)

## 2021-03-30 MED ORDER — ACETAMINOPHEN 325 MG PO TABS
650.0000 mg | ORAL_TABLET | Freq: Four times a day (QID) | ORAL | Status: DC | PRN
Start: 2021-03-30 — End: 2021-06-21

## 2021-03-30 MED ORDER — ONDANSETRON HCL 4 MG PO TABS
4.0000 mg | ORAL_TABLET | Freq: Four times a day (QID) | ORAL | 0 refills | Status: DC | PRN
Start: 2021-03-30 — End: 2021-04-19

## 2021-03-30 MED ORDER — OXYCODONE HCL 5 MG PO TABS: 5.0000 mg | ORAL_TABLET | Freq: Four times a day (QID) | ORAL | 0 refills | Status: DC | PRN

## 2021-03-30 NOTE — Progress Notes (Signed)
[facility] SURGICAL PROGRESS NOTE (cpt [phone])  Hospital Day(s): 4.   Interval History: Patient seen and examined, no acute events or new complaints overnight. Patient reports this morning she continue to feel better compared to admission. She does appear to be taking her 0.5mg  every 3-4 hours; however, she last had this at 1800 last night. She states she had "gas pain" which was different to her pancreatitis pain but is pain free this morning. No fever, chills, nausea, emesis. Renal function remains normal; sCr - 0.53; UO - unmeasured. No electrolyte derangements. LFTs and bilirubin are all improving. She is on soft diet; tolerating well.   Review of Systems:  Constitutional: denies fever, chills  HEENT: denies cough or congestion  Respiratory: denies any shortness of breath  Cardiovascular: denies chest pain or palpitations  Gastrointestinal: denied abdominal pain, denied N/V Genitourinary: denies burning with urination or urinary frequency  Vital signs in last 24 hours: [min-max] current  Temp:  [97.6 F (36.4 C)-98.4 F (36.9 C)] 98.4 F (36.9 C) (12/22 0508) Pulse Rate:  [80-96] 96 (12/22 0508) Resp:  [16-18] 16 (12/22 0508) BP: (120-138)/(74-92) 138/87 (12/22 0508) SpO2:  [100 %] 100 % (12/22 0508)     Height: 5\' 7"  (170.2 cm) Weight: 127 kg BMI (Calculated): 43.84   Intake/Output last 2 shifts:  12/21 0701 - 12/22 0700 In: 2453.2 [P.O.:480; I.V.:1973.2] Out: -    Physical Exam:  Constitutional: alert, cooperative and no distress  HENT: normocephalic without obvious abnormality  Eyes: PERRL, EOM's grossly intact and symmetric  Respiratory: breathing non-labored at rest  Cardiovascular: regular rate and sinus rhythm  Gastrointestinal: Soft, she is non-tender this morning, non-distended, no rebound/guarding.  Musculoskeletal: no edema or wounds, motor and sensation grossly intact, NT    Labs:  CBC Latest Ref Rng & Units 03/28/2021 03/27/2021 03/26/2021   WBC 4.0 - 10.5 K/uL 11.0(H) 14.6(H) 11.5(H)  Hemoglobin 12.0 - 15.0 g/dL 10.9(L) 12.0 13.6  Hematocrit 36.0 - 46.0 % 34.1(L) 37.4 42.7  Platelets 150 - 400 K/uL 300 306 351   CMP Latest Ref Rng & Units 03/30/2021 03/29/2021 03/28/2021  Glucose 70 - 99 mg/dL 101(H) 93 62(L)  BUN 6 - 20 mg/dL 8 7 7   Creatinine 0.44 - 1.00 mg/dL 0.53 0.43(L) 0.48  Sodium 135 - 145 mmol/L 137 135 134(L)  Potassium 3.5 - 5.1 mmol/L 3.5 3.5 3.3(L)  Chloride 98 - 111 mmol/L 109 107 105  CO2 22 - 32 mmol/L 24 24 19(L)  Calcium 8.9 - 10.3 mg/dL 8.9 8.4(L) 8.9  Total Protein 6.5 - 8.1 g/dL 6.6 6.4(L) 6.8  Total Bilirubin 0.3 - 1.2 mg/dL 0.5 0.8 1.6(H)  Alkaline Phos 38 - 126 U/L 159(H) 175(H) 211(H)  AST 15 - 41 U/L 24 29 61(H)  ALT 0 - 44 U/L 118(H) 166(H) 256(H)     Imaging studies: No new pertinent imaging studies   Assessment/Plan: (ICD-10's: K85.10) 30 y.o. female with persistent abdominal pain but improvement in her LFTs, bilirubin level, and leukocytosis, admitted with gallstone pancreatitis.    - She is now pain free and has not required pain medications overnight. However, she did have significant inflammatory changes on imaging at time of admission and symptoms have persisted now for ~1 week. As such, we will defer any interval cholecystectomy to the outpatient setting and plan for interval cholecystectomy in 6-8 weeks.     - Okay to continue soft diet; recommend low fat. We did review dietary recommendations for home to help limit recurrence; update  this in her DC instructions as well  - Monitor LFTs; nearly resolved  - Monitor abdominal examination  - Pain control prn; Antiemetics prn - Mobilization as tolerated - Further management per primary service; we will follow    - Discharge Planning: Stable for discharge from surgical perspective, dietary recommendations reviewed. She will follow up in ~2 weeks to plan interval cholecystectomy   All of the above findings and recommendations were  discussed with the patient, and the medical team, and all of patient's questions were answered to her expressed satisfaction.  -- Lynden Oxford, PA-C [facility] 03/30/2021, 7:33 AM [phone] M-F: 7am - 4pm

## 2021-03-30 NOTE — Discharge Summary (Signed)
[facility] - [place] [facility]   PATIENT NAME: Ashley Howell    MR#:  [id number]  DATE OF BIRTH:  1990-09-16  DATE OF ADMISSION:  03/26/2021 ADMITTING PHYSICIAN: Hannah Beat, MD  DATE OF DISCHARGE: 03/30/2021 12:24 PM  PRIMARY CARE PHYSICIAN: [facility]    ADMISSION DIAGNOSIS:  Hypokalemia [E87.6] Bilirubinemia [E80.6] Vaginal discharge [N89.8] Gall stone pancreatitis [K85.10] Calculus of gallbladder without cholecystitis without obstruction [K80.20] Acute pancreatitis, unspecified complication status, unspecified pancreatitis type [K85.90]  DISCHARGE DIAGNOSIS:  Active Problems:   Essential hypertension   Acute gallstone pancreatitis   Elevated liver function tests   Hypokalemia   SECONDARY DIAGNOSIS:   Past Medical History:  Diagnosis Date   Anxiety    ASD (atrial septal defect)    Benign essential hypertension, antepartum 05/19/2018   160/99 at anatomy scan 18 weeks   Pt sent to ER on 05/15/2018 resolved with rest   Elevated at u/s visit - labetalol 100 mg bid initiated   Hx of cardiomegaly    Hypertension    PFO (patent foramen ovale)    a. 12/2014 Echo: EF 55-60%, mildly dil LA. PFO w/o significant shunting (reviewed by Dr. Mariah Milling 07/2018).   Shortness of breath dyspnea    Spinal headache     HOSPITAL COURSE:   1.  Gallstone pancreatitis.  Seen by general surgery during this hospitalization and they wanted the inflammation of the pancreas to go down before they did cholecystectomy.  I will follow-up as outpatient and do cholecystectomy as outpatient.  MRCP showed findings of acute pancreatitis without evidence of peripancreatic hemorrhage mass or necrosis or ductal dilation.  Cholelithiasis and mild gallbladder distention without evidence of wall thickening or inflammatory change.  Ultrasound showed gallstones but no evidence of acute cholecystitis.  Patient was in pain throughout the entire hospital course I did add  a small amount of oxycodone the day prior to disposition and she was doing better and was able to tolerate solid food prior to discharge home lipase was 8036 on presentation and down to 67 upon disposition. 2.  Elevated liver function test.  AST elevated at 1163 on presentation and normalized down to 29 upon disposition.  ALT 699 on presentation down to 166 on discharge.  Total bilirubin peaked at 4.1 and down to 0.8 upon disposition.  Alkaline phosphatase 281 at its peak and down to 175 upon disposition. 3.  Essential hypertension on amlodipine 4.  Hypokalemia.  Patient received potassium and IV fluids during hospital course 5.  Morbid obesity with a BMI of 43.85 6.  Iron deficiency anemia.  Ferritin 32, last hemoglobin 10.9.  DISCHARGE CONDITIONS:   Satisfactory  CONSULTS OBTAINED:  Treatment Team:  Henrene Dodge, MD  DRUG ALLERGIES:  No Known Allergies  DISCHARGE MEDICATIONS:   Allergies as of 03/30/2021   No Known Allergies      Medication List     TAKE these medications    acetaminophen 325 MG tablet Commonly known as: TYLENOL Take 2 tablets (650 mg total) by mouth every 6 (six) hours as needed for mild pain or moderate pain (or Fever >/= 101).   amLODipine 5 MG tablet Commonly known as: NORVASC Take 5 mg by mouth daily.   ondansetron 4 MG tablet Commonly known as: ZOFRAN Take 1 tablet (4 mg total) by mouth every 6 (six) hours as needed for nausea.   oxyCODONE 5 MG immediate release tablet Commonly known as: Oxy IR/ROXICODONE Take 1 tablet (5 mg total) by  mouth every 6 (six) hours as needed for severe pain.         DISCHARGE INSTRUCTIONS:   Follow-up with PMD Follow-up with general surgery to schedule cholecystectomy  If you experience worsening of your admission symptoms, develop shortness of breath, life threatening emergency, suicidal or homicidal thoughts you must seek medical attention immediately by calling 911 or calling your MD immediately  if  symptoms less severe.  You Must read complete instructions/literature along with all the possible adverse reactions/side effects for all the Medicines you take and that have been prescribed to you. Take any new Medicines after you have completely understood and accept all the possible adverse reactions/side effects.   Please note  You were cared for by a hospitalist during your hospital stay. If you have any questions about your discharge medications or the care you received while you were in the hospital after you are discharged, you can call the unit and asked to speak with the hospitalist on call if the hospitalist that took care of you is not available. Once you are discharged, your primary care physician will handle any further medical issues. Please note that NO REFILLS for any discharge medications will be authorized once you are discharged, as it is imperative that you return to your primary care physician (or establish a relationship with a primary care physician if you do not have one) for your aftercare needs so that they can reassess your need for medications and monitor your lab values.    Today   CHIEF COMPLAINT:   Chief Complaint  Patient presents with   Flank Pain   Abdominal Pain    HISTORY OF PRESENT ILLNESS:  Ashley Howell  is a 30 y.o. female came in with abdominal pain   VITAL SIGNS:  Blood pressure 134/88, pulse 78, temperature 98.3 F (36.8 C), temperature source Oral, resp. rate 20, height 5\' 7"  (1.702 m), weight 127 kg, last menstrual period 03/08/2021, SpO2 99 %, unknown if currently breastfeeding.  I/O:   Intake/Output Summary (Last 24 hours) at 03/30/2021 1714 Last data filed at 03/30/2021 1050 Gross per 24 hour  Intake 1305.89 ml  Output --  Net 1305.89 ml    PHYSICAL EXAMINATION:  GENERAL:  30 y.o.-year-old patient lying in the bed with no acute distress.  EYES: Pupils equal, round, reactive to light and accommodation. No scleral  icterus. HEENT: Head atraumatic, normocephalic. Oropharynx and nasopharynx clear.  LUNGS: Normal breath sounds bilaterally, no wheezing, rales,rhonchi or crepitation. No use of accessory muscles of respiration.  CARDIOVASCULAR: S1, S2 normal. No murmurs, rubs, or gallops.  ABDOMEN: Soft, slight tendernss, non-distended. Bowel sounds present. No organomegaly or mass.  EXTREMITIES: No pedal edema.  NEUROLOGIC: Cranial nerves II through XII are intact. Muscle strength 5/5 in all extremities. Sensation intact. Gait not checked.  PSYCHIATRIC: The patient is alert and oriented x 3.  SKIN: No obvious rash, lesion, or ulcer.   DATA REVIEW:   CBC Recent Labs  Lab 03/28/21 0454  WBC 11.0*  HGB 10.9*  HCT 34.1*  PLT 300    Chemistries  Recent Labs  Lab 03/30/21 0556  NA 137  K 3.5  CL 109  CO2 24  GLUCOSE 101*  BUN 8  CREATININE 0.53  CALCIUM 8.9  AST 24  ALT 118*  ALKPHOS 159*  BILITOT 0.5     Microbiology Results  Results for orders placed or performed during the hospital encounter of 03/26/21  Chlamydia/NGC rt PCR ([facility] only)  Status: None   Collection Time: 03/26/21  5:29 PM   Specimen: Cervical/Vaginal swab; GU  Result Value Ref Range Status   Specimen source GC/Chlam ENDOCERVICAL  Final   Chlamydia Tr NOT DETECTED NOT DETECTED Final   N gonorrhoeae NOT DETECTED NOT DETECTED Final    Comment: (NOTE) This CT/NG assay has not been evaluated in patients with a history of  hysterectomy. Performed at [facility], [address] [place], [place] [address]   Wet prep, genital     Status: None   Collection Time: 03/26/21  5:29 PM   Specimen: Cervical/Vaginal swab  Result Value Ref Range Status   Yeast Wet Prep HPF POC NONE SEEN NONE SEEN Final   Trich, Wet Prep NONE SEEN NONE SEEN Final   Clue Cells Wet Prep HPF POC NONE SEEN NONE SEEN Final   WBC, Wet Prep HPF POC <10 <10 Final   Sperm PRESENT  Final    Comment: Performed at [facility],  [address] [place], [place] [address]  Resp Panel by RT-PCR (Flu A&B, Covid) Nasopharyngeal Swab     Status: None   Collection Time: 03/26/21  6:09 PM   Specimen: Nasopharyngeal Swab; Nasopharyngeal(NP) swabs in vial transport medium  Result Value Ref Range Status   SARS Coronavirus 2 by RT PCR NEGATIVE NEGATIVE Final    Comment: (NOTE) SARS-CoV-2 target nucleic acids are NOT DETECTED.  The SARS-CoV-2 RNA is generally detectable in upper respiratory specimens during the acute phase of infection. The lowest concentration of SARS-CoV-2 viral copies this assay can detect is 138 copies/mL. A negative result does not preclude SARS-Cov-2 infection and should not be used as the sole basis for treatment or other patient management decisions. A negative result may occur with  improper specimen collection/handling, submission of specimen other than nasopharyngeal swab, presence of viral mutation(s) within the areas targeted by this assay, and inadequate number of viral copies(<138 copies/mL). A negative result must be combined with clinical observations, patient history, and epidemiological information. The expected result is Negative.  Fact Sheet for Patients:  BloggerCourse.com  Fact Sheet for Healthcare Providers:  SeriousBroker.it  This test is no t yet approved or cleared by the [place] FDA and  has been authorized for detection and/or diagnosis of SARS-CoV-2 by FDA under an Emergency Use Authorization (EUA). This EUA will remain  in effect (meaning this test can be used) for the duration of the COVID-19 declaration under Section 564(b)(1) of the Act, 21 U.S.C.section 360bbb-3(b)(1), unless the authorization is terminated  or revoked sooner.       Influenza A by PCR NEGATIVE NEGATIVE Final   Influenza B by PCR NEGATIVE NEGATIVE Final    Comment: (NOTE) The Xpert Xpress SARS-CoV-2/FLU/RSV plus assay is intended as an  aid in the diagnosis of influenza from Nasopharyngeal swab specimens and should not be used as a sole basis for treatment. Nasal washings and aspirates are unacceptable for Xpert Xpress SARS-CoV-2/FLU/RSV testing.  Fact Sheet for Patients: BloggerCourse.com  Fact Sheet for Healthcare Providers: SeriousBroker.it  This test is not yet approved or cleared by the [place] FDA and has been authorized for detection and/or diagnosis of SARS-CoV-2 by FDA under an Emergency Use Authorization (EUA). This EUA will remain in effect (meaning this test can be used) for the duration of the COVID-19 declaration under Section 564(b)(1) of the Act, 21 U.S.C. section 360bbb-3(b)(1), unless the authorization is terminated or revoked.  Performed at [facility], [address] [place],  [place] [address]       Management plans discussed with the patient, and she is in agreement.  CODE STATUS:     Code Status Orders  (From admission, onward)           Start     Ordered   03/26/21 2039  Full code  Continuous        03/26/21 2042           Code Status History     Date Active Date Inactive Code Status Order ID Comments User Context   10/06/2020 1242 10/08/2020 2052 Full Code [id number]  Natale Milch, MD Inpatient   09/08/2020 1730 09/09/2020 0133 Full Code [id number]  Vena Austria, MD Inpatient   07/03/2020 1424 07/03/2020 2014 Full Code [id number]  Mirna Mires, CNM Inpatient   10/09/2018 1020 10/11/2018 1926 Full Code [id number]  Natale Milch, MD Inpatient   10/06/2018 1037 10/06/2018 1609 Full Code [id number]  [name], CNM Inpatient   09/25/2018 2255 09/26/2018 0429 Full Code [id number]  Conard Novak, MD Inpatient   08/21/2018 2200 08/22/2018 0734 Full Code [id number]  Nadara Mustard, MD Inpatient   07/28/2018 1147 07/28/2018 1618 Full Code [id number]  [name], CNM Inpatient   06/29/2018 0012  06/29/2018 0422 Full Code [id number]  Nadara Mustard, MD Inpatient   02/22/2015 1200 02/25/2015 1643 Full Code [id number]  Nadara Mustard, MD Inpatient   02/22/2015 0646 02/22/2015 1200 Full Code [id number]  Kennith Gain, RN Inpatient   02/15/2015 1222 02/15/2015 2002 Full Code [id number]  Farrel Conners, CNM Inpatient   12/08/2014 1810 12/08/2014 2202 Full Code [id number]  Vale Haven, RN Inpatient   11/12/2014 1320 11/12/2014 1941 Full Code [id number]  Vale Haven, RN Inpatient       TOTAL TIME TAKING CARE OF THIS PATIENT: 34 minutes.    Alford Highland M.D on 03/30/2021 at 5:14 PM    [facility]  CC: Primary care physician; [facility]

## 2021-04-07 LAB — MISC LABCORP TEST (SEND OUT): Labcorp test code: 791584

## 2021-04-19 ENCOUNTER — Other Ambulatory Visit: Payer: Self-pay

## 2021-04-19 ENCOUNTER — Telehealth: Payer: Self-pay | Admitting: Surgery

## 2021-04-19 ENCOUNTER — Encounter: Payer: Self-pay | Admitting: Surgery

## 2021-04-19 ENCOUNTER — Ambulatory Visit: Payer: Medicaid Other | Admitting: Surgery

## 2021-04-19 VITALS — BP 162/96 | HR 79 | Temp 98.3°F | Ht 67.0 in | Wt 277.4 lb

## 2021-04-19 DIAGNOSIS — K851 Biliary acute pancreatitis without necrosis or infection: Secondary | ICD-10-CM

## 2021-04-19 NOTE — Telephone Encounter (Signed)
Patient has been advised of Pre-Admission date/time, COVID Testing date and Surgery date.  Surgery Date: 05/23/21 Preadmission Testing Date: 05/09/21 (phone 8a-1p) Covid Testing Date: Not needed.     Patient has been made aware to call [phone], between 1-3:00pm the day before surgery, to find out what time to arrive for surgery.

## 2021-04-19 NOTE — Progress Notes (Signed)
04/19/2021  History of Present Illness: Ashley Howell is a 31 y.o. female presenting for follow-up of gallstone pancreatitis.  Patient was initially admitted on 03/26/2021 with gallstone pancreatitis, elevated LFTs, and elevated lipase.  MRCP was negative but it did show significant inflammation of the pancreas in the peripancreatic area with some fluid extending towards both gutters.  In light of this, it was decided to do interval cholecystectomy instead of proceeding with cholecystectomy during her admission.  The patient presents today for follow-up and reports that she has been doing very well.  She did have some mild pain episode on Christmas day but nothing severe as were prior to the emergency room the week prior.  Currently denies any further pain, nausea, vomiting.  She has been staying away from greasy foods and has been watching her diet very carefully.  Denies any fevers, chills, chest pain, shortness of breath.  Past Medical History: Past Medical History:  Diagnosis Date   Anxiety    ASD (atrial septal defect)    Benign essential hypertension, antepartum 05/19/2018   160/99 at anatomy scan 18 weeks   Pt sent to ER on 05/15/2018 resolved with rest   Elevated at u/s visit - labetalol 100 mg bid initiated   Hx of cardiomegaly    Hypertension    PFO (patent foramen ovale)    a. 12/2014 Echo: EF 55-60%, mildly dil LA. PFO w/o significant shunting (reviewed by Dr. Mariah Milling 07/2018).   Shortness of breath dyspnea    Spinal headache      Past Surgical History: Past Surgical History:  Procedure Laterality Date   CESAREAN SECTION  10/28/2008   CESAREAN SECTION N/A 02/22/2015   Procedure: CESAREAN SECTION;  Surgeon: Nadara Mustard, MD;  Location: [facility] ORS;  Service: Obstetrics;  Laterality: N/A;   CESAREAN SECTION  04/13/2010   CESAREAN SECTION N/A 10/09/2018   Procedure: CESAREAN SECTION;  Surgeon: Natale Milch, MD;  Location: [facility] ORS;  Service: Obstetrics;  Laterality: N/A;    CESAREAN SECTION WITH BILATERAL TUBAL LIGATION Bilateral 10/06/2020   Procedure: CESAREAN SECTION WITH BILATERAL TUBAL LIGATION;  Surgeon: Natale Milch, MD;  Location: [facility] ORS;  Service: Obstetrics;  Laterality: Bilateral;    Home Medications: Prior to Admission medications   Medication Sig Start Date End Date Taking? Authorizing Provider  acetaminophen (TYLENOL) 325 MG tablet Take 2 tablets (650 mg total) by mouth every 6 (six) hours as needed for mild pain or moderate pain (or Fever >/= 101). 03/30/21  Yes Wieting, Richard, MD  amLODipine (NORVASC) 5 MG tablet Take 5 mg by mouth daily.   Yes [provider]    Allergies: No Known Allergies  Review of Systems: Review of Systems  Constitutional:  Negative for chills and fever.  HENT:  Negative for hearing loss.   Respiratory:  Negative for shortness of breath.   Cardiovascular:  Negative for chest pain.  Gastrointestinal:  Negative for abdominal pain, nausea and vomiting.  Genitourinary:  Negative for dysuria.  Musculoskeletal:  Negative for myalgias.  Skin:  Negative for rash.  Neurological:  Negative for dizziness.  Psychiatric/Behavioral:  Negative for depression.    Physical Exam BP (!) 162/96    Pulse 79    Temp 98.3 F (36.8 C) (Oral)    Ht 5\' 7"  (1.702 m)    Wt 277 lb 6.4 oz (125.8 kg)    LMP 04/14/2021    SpO2 98%    BMI 43.45 kg/m  CONSTITUTIONAL: No acute distress HEENT:  Normocephalic, atraumatic, extraocular motion intact. NECK: Trachea is midline, no jugular venous distention RESPIRATORY:  Lungs are clear, and breath sounds are equal bilaterally. Normal respiratory effort without pathologic use of accessory muscles. CARDIOVASCULAR: Heart is regular without murmurs, gallops, or rubs. GI: The abdomen is soft, obese, nondistended, nontender to palpation.  MUSCULOSKELETAL: Normal gait, no peripheral edema NEUROLOGIC:  Motor and sensation is grossly normal.  Cranial nerves are grossly intact. PSYCH:   Alert and oriented to person, place and time. Affect is normal.  Labs/Imaging: None new since her discharge.  Assessment and Plan: This is a 31 y.o. female with a recent episode of gallstone pancreatitis last month with significant peripancreatic inflammation.  -Discussed with the patient again the reason behind holding off on cholecystectomy during her recent admission and instead decided for interval cholecystectomy.  The patient understands that she is in agreement with how things were done at that time.  She reported only very mild episode of discomfort or pain on 04/02/2021 but has not had any issues since.  She would still like to proceed with surgery to prevent any further recurrences of this. - Discussed with her that based on the timing for the inflammation and scarring to subside, we will plan for scheduling her for surgery on 05/23/2021.  Discussed with her the plan for a robotic assisted cholecystectomy with ICG cholangiogram reviewed with her the surgery at length including the risks of bleeding, infection, injury to surrounding structures, that this an outpatient procedure, postoperative activity restrictions, pain control, and she is willing to proceed. - All questions have been answered.  I spent 40 minutes dedicated to the care of this patient on the date of this encounter to include pre-visit review of records, face-to-face time with the patient discussing diagnosis and management, and any post-visit coordination of care.   Melvyn Neth, [facility]

## 2021-04-19 NOTE — Patient Instructions (Signed)
Our surgery scheduler Pamala Hurry will call you within 24-48 to get you scheduled. If you have not heard from her after 48 hours, please call our office. You will need to get Covid tested before surgery and have the blue sheet available when she calls to write down important information.  If you have any concerns or questions, please feel free to call our office.    Minimally Invasive Cholecystectomy  Minimally invasive cholecystectomy is surgery to remove the gallbladder. The gallbladder is a pear-shaped organ that lies beneath the liver on the right side of the body. The gallbladder stores bile, which is a fluid that helps the body digest fats. Cholecystectomy is often done to treat inflammation (irritation and swelling) of the gallbladder (cholecystitis). This condition is usually caused by a buildup of gallstones (cholelithiasis) in the gallbladder or when the fluid in the gall bladder becomes stagnant because gallstones get stuck in the ducts (tubes) and block the flow of bile. This can result in inflammation and pain. In severe cases, emergency surgery may be required. This procedure is done through small incisions in the abdomen, instead of one large incision. It is also called laparoscopic surgery. A thin scope with a camera (laparoscope) is inserted through one incision. Then surgical instruments are inserted through the other incisions. In some cases, a minimally invasive surgery may need to be changed to a surgery that is done through a larger incision. This is called open surgery. Tell a health care provider about: Any allergies you have. All medicines you are taking, including vitamins, herbs, eye drops, creams, and over-the-counter medicines. Any problems you or family members have had with anesthetic medicines. Any bleeding problems you have. Any surgeries you have had. Any medical conditions you have. Whether you are pregnant or may be pregnant. What are the risks? Generally, this is a  safe procedure. However, problems may occur, including: Infection. Bleeding. Allergic reactions to medicines. Damage to nearby structures or organs. A gallstone remaining in the common bile duct. The common bile duct carries bile from the gallbladder to the small intestine. A bile leak from the liver or cystic duct after your gallbladder is removed. What happens before the procedure? When to stop eating and drinking Follow instructions from your health care provider about what you may eat and drink before your procedure. These may include: 8 hours before the procedure Stop eating most foods. Do not eat meat, fried foods, or fatty foods. Eat only light foods, such as toast or crackers. All liquids are okay except energy drinks and alcohol. 6 hours before the procedure Stop eating. Drink only clear liquids, such as water, clear fruit juice, black coffee, plain tea, and sports drinks. Do not drink energy drinks or alcohol. 2 hours before the procedure Stop drinking all liquids. You may be allowed to take medicines with small sips of water. If you do not follow your health care provider's instructions, your procedure may be delayed or canceled. Medicines Ask your health care provider about: Changing or stopping your regular medicines. This is especially important if you are taking diabetes medicines or blood thinners. Taking medicines such as aspirin and ibuprofen. These medicines can thin your blood. Do not take these medicines unless your health care provider tells you to take them. Taking over-the-counter medicines, vitamins, herbs, and supplements. General instructions If you will be going home right after the procedure, plan to have a responsible adult: Take you home from the hospital or clinic. You will not be allowed to drive.  Care for you for the time you are told. Do not use any products that contain nicotine or tobacco for at least 4 weeks before the procedure. These products  include cigarettes, chewing tobacco, and vaping devices, such as e-cigarettes. If you need help quitting, ask your health care provider. Ask your health care provider: How your surgery site will be marked. What steps will be taken to help prevent infection. These may include: Removing hair at the surgery site. Washing skin with a germ-killing soap. Taking antibiotic medicine. What happens during the procedure?  An IV will be inserted into one of your veins. You will be given one or both of the following: A medicine to help you relax (sedative). A medicine to make you fall asleep (general anesthetic). Your surgeon will make several small incisions in your abdomen. The laparoscope will be inserted through one of the small incisions. The camera on the laparoscope will send images to a monitor in the operating room. This lets your surgeon see inside your abdomen. A gas will be pumped into your abdomen. This will expand your abdomen to give the surgeon more room to perform the surgery. Other tools that are needed for the procedure will be inserted through the other incisions. The gallbladder will be removed through one of the incisions. Your common bile duct may be examined. If stones are found in the common bile duct, they may be removed. After your gallbladder has been removed, the incisions will be closed with stitches (sutures), staples, or skin glue. Your incisions will be covered with a bandage (dressing). The procedure may vary among health care providers and hospitals. What happens after the procedure? Your blood pressure, heart rate, breathing rate, and blood oxygen level will be monitored until you leave the hospital or clinic. You will be given medicines as needed to control your pain. You may have a drain placed in the incision. The drain will be removed a day or two after the procedure. Summary Minimally invasive cholecystectomy, also called laparoscopic cholecystectomy, is surgery  to remove the gallbladder using small incisions. Tell your health care provider about all the medical conditions you have and all the medicines you are taking for those conditions. Before the procedure, follow instructions about when to stop eating and drinking and changing or stopping medicines. Plan to have a responsible adult care for you for the time you are told after you leave the hospital or clinic. This information is not intended to replace advice given to you by your health care provider. Make sure you discuss any questions you have with your health care provider. Document Revised: 09/27/2020 Document Reviewed: 09/27/2020 Elsevier Patient Education  [place].

## 2021-04-28 NOTE — Progress Notes (Deleted)
Cardiology Office Note    Date:  04/28/2021   ID:  Ashley Howell, DOB Sep 04, 1990, MRN [id number]  PCP:  [facility]  Cardiologist:  Julien Nordmann, MD  Electrophysiologist:  None   Chief Complaint: ***  History of Present Illness:   Ashley Howell is a 31 y.o. female with history of gestational hypertension, possible PFO by echo, HTN, and tobacco use presents today for follow up of ***.  Prior echo in 2016, as read by outside office, with conflicting report demonstrated an EF of 55 to 60%, normal wall motion, normal LV diastolic function parameters, mildly dilated left atrium, PFO, ASD with left-to-right shunting present.  2D echo with bubble study in 12/2018 demonstrated an EF of 60 to 65%, no LVH, normal RV systolic function and ventricular cavity size, normal-sized left atrium, with saline contrast bubble study no ASD was noted.  There was low suspicion of PFO given negative contrast study, though a very small PFO could not definitively be excluded.  She was last seen in the office in 04/2020 and was doing well from a cardiac perspective.  Echo performed at [facility] in 08/2020, as part of pregnancy surveillance, demonstrated an EF of greater than 55%, no regional wall motion abnormalities, normal LV diastolic function parameters, mildly dilated left atrium, normal RV systolic function and ventricular cavity size, trivial mitral regurgitation, and no evidence of interatrial septal aneurysm by color and spectral Doppler.  More recently, she was admitted to the hospital in 03/2021 with acute gallstone pancreatitis with plans for subsequent outpatient cholecystectomy.  This is currently scheduled for 05/23/2021.  ***   Labs independently reviewed: 03/2021 - potassium 3.5, BUN 8, serum creatinine 0.53, BUN 3.1, AST normal, ALT 118, Hgb 10.9, PLT 300, TC 166, TG 75, HDL 50, LDL 101  Past Medical History:  Diagnosis Date   Anxiety    ASD (atrial septal defect)     Benign essential hypertension, antepartum 05/19/2018   160/99 at anatomy scan 18 weeks   Pt sent to ER on 05/15/2018 resolved with rest   Elevated at u/s visit - labetalol 100 mg bid initiated   Hx of cardiomegaly    Hypertension    PFO (patent foramen ovale)    a. 12/2014 Echo: EF 55-60%, mildly dil LA. PFO w/o significant shunting (reviewed by Dr. Mariah Milling 07/2018).   Shortness of breath dyspnea    Spinal headache     Past Surgical History:  Procedure Laterality Date   CESAREAN SECTION  10/28/2008   CESAREAN SECTION N/A 02/22/2015   Procedure: CESAREAN SECTION;  Surgeon: Nadara Mustard, MD;  Location: [facility] ORS;  Service: Obstetrics;  Laterality: N/A;   CESAREAN SECTION  04/13/2010   CESAREAN SECTION N/A 10/09/2018   Procedure: CESAREAN SECTION;  Surgeon: Natale Milch, MD;  Location: [facility] ORS;  Service: Obstetrics;  Laterality: N/A;   CESAREAN SECTION WITH BILATERAL TUBAL LIGATION Bilateral 10/06/2020   Procedure: CESAREAN SECTION WITH BILATERAL TUBAL LIGATION;  Surgeon: Natale Milch, MD;  Location: [facility] ORS;  Service: Obstetrics;  Laterality: Bilateral;    Current Medications: No outpatient medications have been marked as taking for the 05/03/21 encounter (Appointment) with Sondra Barges, PA-C.    Allergies:   Patient has no known allergies.   Social History   Socioeconomic History   Marital status: Single    Spouse name: Not on file   Number of children: 4   Years of education: Not on file   Highest education level:  Not on file  Occupational History   Occupation: CNA  Tobacco Use   Smoking status: Some Days    Packs/day: 0.25    Years: 2.00    Pack years: 0.50    Types: Cigarettes   Smokeless tobacco: Never  Vaping Use   Vaping Use: Never used  Substance and Sexual Activity   Alcohol use: No   Drug use: No   Sexual activity: Yes    Birth control/protection: None  Other Topics Concern   Not on file  Social History Narrative   Not on file   Social  Determinants of Health   Financial Resource Strain: Not on file  Food Insecurity: Not on file  Transportation Needs: Not on file  Physical Activity: Not on file  Stress: Not on file  Social Connections: Not on file     Family History:  The patient's family history includes Cancer in her maternal grandfather; Hypertension in her mother; Stroke in her paternal aunt.  ROS:   ROS   EKGs/Labs/Other Studies Reviewed:    Studies reviewed were summarized above. The additional studies were reviewed today:  2D echo 08/26/2020 ([facility]): ECHOCARDIOGRAPHIC DESCRIPTIONS -----------------------------------------------  AORTIC ROOT          Size: Normal    Dissection: INDETERM FOR DISSECTION   AORTIC VALVE      Leaflets: Tricuspid             Morphology: Normal      Mobility: Fully Mobile   LEFT VENTRICLE                                      Anterior: Normal          Size: Normal                                 Lateral: Normal   Contraction: Normal                                  Septal: Normal    Closest EF: >55%(Estimated)  Calc.EF: 60% (3D)      Apical: Normal     LV masses: No Masses                             Inferior: Normal           LVH: None                                 Posterior: Normal   LV GLS(GE): -21.8% Normal Range [ <= -16]  Dias.FxClass: Normal   MITRAL VALVE      Leaflets: Normal                  Mobility: Fully mobile    Morphology: Normal   LEFT ATRIUM          Size: MILDLY ENLARGED     LA masses: No masses                IAS ANEURYSM   MAIN PA          Size: MILDLY DILATED   PULMONIC VALVE    Morphology: Normal  Mobility: Fully Mobile   RIGHT VENTRICLE          Size: Normal                    Free wall: Normal   Contraction: Normal                    RV masses: No Masses         TAPSE:   3.2 cm,  Normal Range [>= 1.6 cm]   TRICUSPID VALVE      Leaflets: Normal                  Mobility: Fully mobile    Morphology: Normal   RIGHT ATRIUM           Size: Normal                     RA Other: None     RA masses: No masses   PERICARDIUM         Fluid: No effusion   INFERIOR VENACAVA          Size: Normal     ABNORMAL RESPIRATORY COLLAPSE   DOPPLER ECHO and OTHER SPECIAL PROCEDURES ------------------------------------     Aortic: No AR                  No AS      Mitral: TRIVIAL MR             No MS     MV Inflow E Vel.= 134.0 cm/s  MV Annulus E'Vel.= 10.5 cm/s  E/E'Ratio= 13   Tricuspid: TRIVIAL TR             No TS   Pulmonary: TRIVIAL PR             No PS       Other:   INTERPRETATION ---------------------------------------------------------------    NORMAL LEFT VENTRICULAR SYSTOLIC FUNCTION    NORMAL LA PRESSURES WITH NORMAL DIASTOLIC FUNCTION    NORMAL RIGHT VENTRICULAR SYSTOLIC FUNCTION    VALVULAR REGURGITATION: TRIVIAL MR, TRIVIAL PR, TRIVIAL TR    NO VALVULAR STENOSIS    PATIENT CURRENTLY [redacted]WEEKS GESTATION; NO SALINE MICROCAVITATION STUDY    PERFORMED    INTERATRIAL SEPTAL ANEURYSM WITHOUT EVIDENCE OF SHUNTING BY COLOR AND    SPECTRAL DOPPLER    NO PRIOR STUDY FOR COMPARISON __________  2D echo with bubble study 01/01/2019: 1. Left ventricular ejection fraction, by visual estimation, is 60 to  65%. The left ventricle has normal function. Normal left ventricular size.  There is no left ventricular hypertrophy.   2. Global right ventricle has normal systolic function.The right  ventricular size is normal. No increase in right ventricular wall  thickness.   3. Left atrial size was normal.   4. Saline contrast bubble study was performed which was nagative. No ASD  noted. Low suspicion of PFO given negative contrast study, though a very  small PFO can not be definatively excluded. __________  2D echo 12/23/2014: - Left ventricle: The cavity size was normal. Systolic function was    normal. The estimated ejection fraction was in the range of 55%    to 60%. Wall motion was normal; there were no regional wall     motion abnormalities. Left ventricular diastolic function    parameters were normal.  - Aortic valve: Valve area (Vmax): 2.49 cm^2.  - Left atrium: The atrium was mildly dilated.  - Atrial  septum: There was a patent foramen ovale. Echo contrast    study showed no right-to-left atrial level shunt, at baseline or    with provocation.   Impressions:   - Possible atrial-septal defec (ASD) with left to right shunt    present. Consider f/u echo.   EKG:  EKG is ordered today.  The EKG ordered today demonstrates ***  Recent Labs: 03/28/2021: Hemoglobin 10.9; Platelets 300 03/30/2021: ALT 118; BUN 8; Creatinine, Ser 0.53; Potassium 3.5; Sodium 137  Recent Lipid Panel    Component Value Date/Time   CHOL 166 03/26/2021 1615   TRIG 84 03/27/2021 0934   HDL 50 03/26/2021 1615   CHOLHDL 3.3 03/26/2021 1615   VLDL 15 03/26/2021 1615   LDLCALC 101 (H) 03/26/2021 1615    PHYSICAL EXAM:    VS:  LMP 04/14/2021   BMI: There is no height or weight on file to calculate BMI.  Physical Exam  Wt Readings from Last 3 Encounters:  04/19/21 277 lb 6.4 oz (125.8 kg)  03/26/21 279 lb 15.8 oz (127 kg)  03/25/21 280 lb (127 kg)     ASSESSMENT & PLAN:   Possible PFO:  HTN: Blood pressure ***  Previously reported ASD:  Tobacco use:  Obesity:   {Are you ordering a CV Procedure (e.g. stress test, cath, DCCV, TEE, etc)?   Press F2        :[id number]}     Disposition: F/u with Dr. Mariah Milling or an APP in ***.   Medication Adjustments/Labs and Tests Ordered: Current medicines are reviewed at length with the patient today.  Concerns regarding medicines are outlined above. Medication changes, Labs and Tests ordered today are summarized above and listed in the Patient Instructions accessible in Encounters.   Signed, Eula Listen, PA-C 04/28/2021 12:48 PM     [facility] [address] [place], [place] [address] [phone]

## 2021-05-03 ENCOUNTER — Ambulatory Visit: Payer: Medicaid Other | Admitting: Physician Assistant

## 2021-05-04 ENCOUNTER — Encounter: Payer: Self-pay | Admitting: Physician Assistant

## 2021-05-09 ENCOUNTER — Inpatient Hospital Stay
Admission: RE | Admit: 2021-05-09 | Discharge: 2021-05-09 | Disposition: A | Discharge status: Home / Self Care | Payer: Medicaid Other | Source: Ambulatory Visit

## 2021-05-09 NOTE — Patient Instructions (Signed)
Your procedure is scheduled on: Tuesday, February 14 Report to the Registration Desk on the 1st floor of the [facility] To find out your arrival time, please call [phone] between 1PM - 3PM on: Monday, February 13  REMEMBER: Instructions that are not followed completely may result in serious medical risk, up to and including death; or upon the discretion of your surgeon and anesthesiologist your surgery may need to be rescheduled.  Do not eat food after midnight the night before surgery.  No gum chewing, lozengers or hard candies.  You may however, drink CLEAR liquids up to 2 hours before you are scheduled to arrive for your surgery. Do not drink anything within 2 hours of your scheduled arrival time.  Clear liquids include: - water  - apple juice without pulp - gatorade (not RED, PURPLE, OR BLUE) - black coffee or tea (Do NOT add milk or creamers to the coffee or tea) Do NOT drink anything that is not on this list.  TAKE THESE MEDICATIONS THE MORNING OF SURGERY WITH A SIP OF WATER:  Amlodipine  One week prior to surgery: starting February 7 Stop Anti-inflammatories (NSAIDS) such as Advil, Aleve, Ibuprofen, Motrin, Naproxen, Naprosyn and Aspirin based products such as Excedrin, Goodys Powder, BC Powder. Stop ANY OVER THE COUNTER supplements until after surgery. You may however, continue to take Tylenol if needed for pain up until the day of surgery.  No Alcohol for 24 hours before or after surgery.  No Smoking including e-cigarettes for 24 hours prior to surgery.  No chewable tobacco products for at least 6 hours prior to surgery.  No nicotine patches on the day of surgery.  Do not use any "recreational" drugs for at least a week prior to your surgery.  Please be advised that the combination of cocaine and anesthesia may have negative outcomes, up to and including death. If you test positive for cocaine, your surgery will be cancelled.  On the morning of surgery brush  your teeth with toothpaste and water, you may rinse your mouth with mouthwash if you wish. Do not swallow any toothpaste or mouthwash.  Use CHG Soap as directed on instruction sheet.  Do not wear jewelry, make-up, hairpins, clips or nail polish.  Do not wear lotions, powders, or perfumes.   Do not shave body from the neck down 48 hours prior to surgery just in case you cut yourself which could leave a site for infection.  Also, freshly shaved skin may become irritated if using the CHG soap.  Contact lenses, hearing aids and dentures may not be worn into surgery.  Do not bring valuables to the hospital. [facility] is not responsible for any missing/lost belongings or valuables.   Bring your C-PAP to the hospital with you in case you may have to spend the night.   Notify your doctor if there is any change in your medical condition (cold, fever, infection).  Wear comfortable clothing (specific to your surgery type) to the hospital.  After surgery, you can help prevent lung complications by doing breathing exercises.  Take deep breaths and cough every 1-2 hours. Your doctor may order a device called an Incentive Spirometer to help you take deep breaths. When coughing or sneezing, hold a pillow firmly against your incision with both hands. This is called splinting. Doing this helps protect your incision. It also decreases belly discomfort.  If you are being discharged the day of surgery, you will not be allowed to drive home. You will need  a responsible adult (18 years or older) to drive you home and stay with you that night.   If you are taking public transportation, you will need to have a responsible adult (18 years or older) with you. Please confirm with your physician that it is acceptable to use public transportation.   Please call the Pre-admissions Testing Dept. at [phone] if you have any questions about these instructions.  Surgery Visitation Policy:  Patients  undergoing a surgery or procedure may have one family member or support person with them as long as that person is not COVID-19 positive or experiencing its symptoms.  That person may remain in the waiting area during the procedure and may rotate out with other people.

## 2021-05-10 ENCOUNTER — Inpatient Hospital Stay
Admission: RE | Admit: 2021-05-10 | Discharge: 2021-05-10 | Disposition: A | Discharge status: Home / Self Care | Payer: Medicaid Other | Source: Ambulatory Visit

## 2021-05-10 NOTE — Pre-Procedure Instructions (Signed)
Multiple phone calls to patient and mother on January 31 and February 1 to complete preop interview were unsuccessful. Both phone numbers go to voicemail and unable to leave a message. Call to other emergency contact stated that she doesn't have a good number for the patient either. Surgery is scheduled for February 14 with Dr. Aleen Campi. Dr. Adelene Idler office made aware of unsuccessful attempts to complete preop interview.

## 2021-05-11 ENCOUNTER — Telehealth: Payer: Self-pay | Admitting: Cardiovascular Disease

## 2021-05-11 NOTE — Telephone Encounter (Signed)
Attempted to schedule no ans no vm  

## 2021-05-19 ENCOUNTER — Other Ambulatory Visit: Payer: Self-pay

## 2021-05-19 ENCOUNTER — Other Ambulatory Visit
Admission: RE | Admit: 2021-05-19 | Discharge: 2021-05-19 | Disposition: A | Discharge status: Home / Self Care | Payer: Medicaid Other | Source: Ambulatory Visit | Attending: Surgery | Admitting: Surgery

## 2021-05-19 NOTE — Patient Instructions (Signed)
Your procedure is scheduled on: Tuesday May 23, 2021. Report to Day Surgery inside [facility] 2nd floor, stop by admissions desk before getting on elevator.  To find out your arrival time please call [phone] between 1PM - 3PM on Monday May 22, 2021.  Remember: Instructions that are not followed completely may result in serious medical risk,  up to and including death, or upon the discretion of your surgeon and anesthesiologist your  surgery may need to be rescheduled.     _X__ 1. Do not eat food after midnight the night before your procedure.                 No chewing gum or hard candies. You may drink clear liquids up to 2 hours                 before you are scheduled to arrive for your surgery- DO not drink clear                 liquids within 2 hours of the start of your surgery.                 Clear Liquids include:  water, apple juice without pulp, clear Gatorade, G2 or                  Gatorade Zero (avoid Red/Purple/Blue), Black Coffee or Tea (Do not add                 anything to coffee or tea).  __X__2.  On the morning of surgery brush your teeth with toothpaste and water, you                may rinse your mouth with mouthwash if you wish.  Do not swallow any toothpaste or mouthwash.     _X__ 3.  No Alcohol for 24 hours before or after surgery.   _X__ 4.  Do Not Smoke or use e-cigarettes For 24 Hours Prior to Your Surgery.                 Do not use any chewable tobacco products for at least 6 hours prior to                 Surgery.  _X__  5.  Do not use any recreational drugs (marijuana, cocaine, heroin, ecstasy, MDMA or other)                For at least one week prior to your surgery.  Combination of these drugs with anesthesia                May have life threatening results.  ____  6.  Bring all medications with you on the day of surgery if instructed.   __X__  7.  Notify your doctor if there is any change in your medical  condition      (cold, fever, infections).     Do not wear jewelry, make-up, hairpins, clips or nail polish. Do not wear lotions, powders, or perfumes. You may wear deodorant. Do not shave 48 hours prior to surgery.  Do not bring valuables to the hospital.    [facility] is not responsible for any belongings or valuables.  Contacts, dentures or bridgework may not be worn into surgery. Leave your suitcase in the car. After surgery it may be brought to your room. For patients admitted to the hospital, discharge time is determined by your treatment team.  Patients discharged the day of surgery will not be allowed to drive home.   Make arrangements for someone to be with you for the first 24 hours of your Same Day Discharge.   __X__ Take these medicines the morning of surgery with A SIP OF WATER:    1. amLODipine (NORVASC) 5 MG   2.   3.   4.  5.  6.  ____ Fleet Enema (as directed)   __X__ Use CHG Soap (or wipes) as directed  ____ Use Benzoyl Peroxide Gel as instructed  ____ Use inhalers on the day of surgery  ____ Stop metformin 2 days prior to surgery    ____ Take 1/2 of usual insulin dose the night before surgery. No insulin the morning          of surgery.   ____ Call your PCP, cardiologist, or Pulmonologist if taking Coumadin/Plavix/aspirin and ask when to stop before your surgery.   __X__ One Week prior to surgery- Stop Anti-inflammatories such as Ibuprofen, Aleve, Advil, Motrin, meloxicam (MOBIC), diclofenac, etodolac, ketorolac, Toradol, Daypro, piroxicam, Goody's or BC powders. OK TO USE TYLENOL IF NEEDED   __X__ Stop supplements until after surgery.    ____ Bring C-Pap to the hospital.    If you have any questions regarding your pre-procedure instructions,  Please call Pre-admit Testing at [phone]

## 2021-05-19 NOTE — Pre-Procedure Instructions (Signed)
Was able to speak with Patient after speaking for PAT appointment. Spoke with Ashley Howell about patient stating that she was told needed a cardiac clearance, but not noted on her chart. Reviewed chart with Ashley Howell, and he stated that she is ok. Notified patient above and PAT bag with instructions and CHG soap is ready at our office. Patient ok with information given.

## 2021-05-23 ENCOUNTER — Ambulatory Visit: Payer: Medicaid Other | Admitting: Urgent Care

## 2021-05-23 ENCOUNTER — Encounter: Payer: Self-pay | Admitting: Surgery

## 2021-05-23 ENCOUNTER — Other Ambulatory Visit: Payer: Self-pay

## 2021-05-23 ENCOUNTER — Ambulatory Visit
Admission: RE | Admit: 2021-05-23 | Discharge: 2021-05-23 | Disposition: A | Discharge status: Home / Self Care | Payer: Medicaid Other | Attending: Surgery | Admitting: Surgery

## 2021-05-23 ENCOUNTER — Encounter
Admission: RE | Disposition: A | Discharge status: Home / Self Care | Payer: Self-pay | Source: Home / Self Care | Attending: Surgery

## 2021-05-23 DIAGNOSIS — K851 Biliary acute pancreatitis without necrosis or infection: Secondary | ICD-10-CM | POA: Diagnosis present

## 2021-05-23 DIAGNOSIS — K8010 Calculus of gallbladder with chronic cholecystitis without obstruction: Secondary | ICD-10-CM | POA: Diagnosis not present

## 2021-05-23 DIAGNOSIS — I10 Essential (primary) hypertension: Secondary | ICD-10-CM | POA: Diagnosis not present

## 2021-05-23 DIAGNOSIS — Z6841 Body Mass Index (BMI) 40.0 and over, adult: Secondary | ICD-10-CM | POA: Diagnosis not present

## 2021-05-23 LAB — POCT PREGNANCY, URINE: Preg Test, Ur: NEGATIVE

## 2021-05-23 SURGERY — CHOLECYSTECTOMY, ROBOT-ASSISTED, LAPAROSCOPIC
Anesthesia: General | Site: Abdomen

## 2021-05-23 MED ORDER — KETAMINE HCL 10 MG/ML IJ SOLN
INTRAMUSCULAR | Status: DC | PRN
  Administered 2021-05-23: 50 mg via INTRAVENOUS

## 2021-05-23 MED ORDER — PHENYLEPHRINE HCL-NACL 20-0.9 MG/250ML-% IV SOLN
INTRAVENOUS | Status: DC | PRN
  Administered 2021-05-23: 70 ug/min via INTRAVENOUS

## 2021-05-23 MED ORDER — FAMOTIDINE 20 MG PO TABS: 20.0000 mg | ORAL_TABLET | Freq: Once | ORAL | Status: AC

## 2021-05-23 MED ORDER — CHLORHEXIDINE GLUCONATE CLOTH 2 % EX PADS
6.0000 | MEDICATED_PAD | Freq: Once | CUTANEOUS | Status: AC
  Administered 2021-05-23: 6 via TOPICAL

## 2021-05-23 MED ORDER — ORAL CARE MOUTH RINSE: 15.0000 mL | Freq: Once | OROMUCOSAL | Status: AC

## 2021-05-23 MED ORDER — SUCCINYLCHOLINE CHLORIDE 200 MG/10ML IV SOSY
PREFILLED_SYRINGE | INTRAVENOUS | Status: DC | PRN
  Administered 2021-05-23: 170 mg via INTRAVENOUS

## 2021-05-23 MED ORDER — GABAPENTIN 300 MG PO CAPS: 300.0000 mg | ORAL_CAPSULE | ORAL | Status: AC

## 2021-05-23 MED ORDER — BUPIVACAINE-EPINEPHRINE (PF) 0.25% -1:200000 IJ SOLN
INTRAMUSCULAR | Status: DC | PRN
  Administered 2021-05-23: 30 mL via PERINEURAL

## 2021-05-23 MED ORDER — LIDOCAINE HCL (CARDIAC) PF 100 MG/5ML IV SOSY
PREFILLED_SYRINGE | INTRAVENOUS | Status: DC | PRN
  Administered 2021-05-23: 100 mg via INTRAVENOUS

## 2021-05-23 MED ORDER — OXYCODONE HCL 5 MG PO TABS
5.0000 mg | ORAL_TABLET | Freq: Once | ORAL | Status: AC
  Administered 2021-05-23: 5 mg via ORAL

## 2021-05-23 MED ORDER — PROMETHAZINE HCL 25 MG/ML IJ SOLN: 6.2500 mg | INTRAMUSCULAR | Status: DC | PRN

## 2021-05-23 MED ORDER — CHLORHEXIDINE GLUCONATE CLOTH 2 % EX PADS: 6.0000 | MEDICATED_PAD | Freq: Once | CUTANEOUS | Status: DC

## 2021-05-23 MED ORDER — FENTANYL CITRATE (PF) 100 MCG/2ML IJ SOLN
25.0000 ug | INTRAMUSCULAR | Status: DC | PRN
  Administered 2021-05-23: 50 ug via INTRAVENOUS

## 2021-05-23 MED ORDER — SUGAMMADEX SODIUM 500 MG/5ML IV SOLN
INTRAVENOUS | Status: DC | PRN
  Administered 2021-05-23: 400 mg via INTRAVENOUS

## 2021-05-23 MED ORDER — HYDROMORPHONE HCL 1 MG/ML IJ SOLN
INTRAMUSCULAR | Status: DC | PRN
  Administered 2021-05-23: 1 mg via INTRAVENOUS

## 2021-05-23 MED ORDER — HYDROMORPHONE HCL 1 MG/ML IJ SOLN
INTRAMUSCULAR | Status: AC
  Filled 2021-05-23: qty 1

## 2021-05-23 MED ORDER — DEXMEDETOMIDINE (PRECEDEX) IN NS 20 MCG/5ML (4 MCG/ML) IV SYRINGE
PREFILLED_SYRINGE | INTRAVENOUS | Status: DC | PRN
  Administered 2021-05-23: 12 ug via INTRAVENOUS

## 2021-05-23 MED ORDER — ACETAMINOPHEN 500 MG PO TABS: 1000.0000 mg | ORAL_TABLET | Freq: Four times a day (QID) | ORAL | Status: DC | PRN

## 2021-05-23 MED ORDER — CHLORHEXIDINE GLUCONATE 0.12 % MT SOLN: 15.0000 mL | Freq: Once | OROMUCOSAL | Status: AC

## 2021-05-23 MED ORDER — FAMOTIDINE 20 MG PO TABS
ORAL_TABLET | ORAL | Status: AC
  Administered 2021-05-23: 20 mg via ORAL
  Filled 2021-05-23: qty 1

## 2021-05-23 MED ORDER — PROPOFOL 500 MG/50ML IV EMUL
INTRAVENOUS | Status: AC
  Filled 2021-05-23: qty 50

## 2021-05-23 MED ORDER — SEVOFLURANE IN SOLN
RESPIRATORY_TRACT | Status: AC
  Filled 2021-05-23: qty 250

## 2021-05-23 MED ORDER — MIDAZOLAM HCL 2 MG/2ML IJ SOLN
INTRAMUSCULAR | Status: DC | PRN
  Administered 2021-05-23: 2 mg via INTRAVENOUS

## 2021-05-23 MED ORDER — GABAPENTIN 300 MG PO CAPS
ORAL_CAPSULE | ORAL | Status: AC
  Administered 2021-05-23: 300 mg via ORAL
  Filled 2021-05-23: qty 1

## 2021-05-23 MED ORDER — MIDAZOLAM HCL 2 MG/2ML IJ SOLN
INTRAMUSCULAR | Status: AC
  Filled 2021-05-23: qty 2

## 2021-05-23 MED ORDER — PROPOFOL 10 MG/ML IV BOLUS
INTRAVENOUS | Status: DC | PRN
  Administered 2021-05-23: 200 mg via INTRAVENOUS

## 2021-05-23 MED ORDER — IBUPROFEN 800 MG PO TABS: 800.0000 mg | ORAL_TABLET | Freq: Three times a day (TID) | ORAL | 1 refills | Status: DC | PRN

## 2021-05-23 MED ORDER — ROCURONIUM BROMIDE 100 MG/10ML IV SOLN
INTRAVENOUS | Status: DC | PRN
Start: 2021-05-23 — End: 2021-05-23
  Administered 2021-05-23: 50 mg via INTRAVENOUS

## 2021-05-23 MED ORDER — FENTANYL CITRATE (PF) 100 MCG/2ML IJ SOLN
INTRAMUSCULAR | Status: AC
  Administered 2021-05-23: 50 ug via INTRAVENOUS
  Filled 2021-05-23: qty 2

## 2021-05-23 MED ORDER — FENTANYL CITRATE (PF) 100 MCG/2ML IJ SOLN
INTRAMUSCULAR | Status: AC
  Filled 2021-05-23: qty 2

## 2021-05-23 MED ORDER — LACTATED RINGERS IV SOLN: INTRAVENOUS | Status: DC | PRN

## 2021-05-23 MED ORDER — FENTANYL CITRATE (PF) 100 MCG/2ML IJ SOLN
INTRAMUSCULAR | Status: DC | PRN
  Administered 2021-05-23 (×2): 50 ug via INTRAVENOUS

## 2021-05-23 MED ORDER — METOPROLOL TARTRATE 5 MG/5ML IV SOLN
INTRAVENOUS | Status: DC | PRN
  Administered 2021-05-23: 3 mg via INTRAVENOUS
  Administered 2021-05-23: 2 mg via INTRAVENOUS

## 2021-05-23 MED ORDER — ACETAMINOPHEN 500 MG PO TABS: 1000.0000 mg | ORAL_TABLET | ORAL | Status: AC

## 2021-05-23 MED ORDER — INDOCYANINE GREEN 25 MG IV SOLR
2.5000 mg | INTRAVENOUS | Status: AC
  Administered 2021-05-23: 2.5 mg via INTRAVENOUS
  Filled 2021-05-23: qty 1

## 2021-05-23 MED ORDER — BUPIVACAINE-EPINEPHRINE (PF) 0.25% -1:200000 IJ SOLN
INTRAMUSCULAR | Status: AC
  Filled 2021-05-23: qty 30

## 2021-05-23 MED ORDER — KETOROLAC TROMETHAMINE 30 MG/ML IJ SOLN
INTRAMUSCULAR | Status: DC | PRN
  Administered 2021-05-23: 30 mg via INTRAVENOUS

## 2021-05-23 MED ORDER — LACTATED RINGERS IV SOLN: INTRAVENOUS | Status: DC

## 2021-05-23 MED ORDER — DEXAMETHASONE SODIUM PHOSPHATE 10 MG/ML IJ SOLN
INTRAMUSCULAR | Status: DC | PRN
  Administered 2021-05-23: 10 mg via INTRAVENOUS

## 2021-05-23 MED ORDER — DEXTROSE 5 % IV SOLN
INTRAVENOUS | Status: DC | PRN
  Administered 2021-05-23: 3 g via INTRAVENOUS

## 2021-05-23 MED ORDER — CHLORHEXIDINE GLUCONATE 0.12 % MT SOLN
OROMUCOSAL | Status: AC
  Administered 2021-05-23: 15 mL via OROMUCOSAL
  Filled 2021-05-23: qty 15

## 2021-05-23 MED ORDER — ACETAMINOPHEN 500 MG PO TABS
ORAL_TABLET | ORAL | Status: AC
  Administered 2021-05-23: 1000 mg via ORAL
  Filled 2021-05-23: qty 2

## 2021-05-23 MED ORDER — PHENYLEPHRINE HCL-NACL 20-0.9 MG/250ML-% IV SOLN
INTRAVENOUS | Status: AC
  Filled 2021-05-23: qty 250

## 2021-05-23 MED ORDER — KETAMINE HCL 50 MG/5ML IJ SOSY
PREFILLED_SYRINGE | INTRAMUSCULAR | Status: AC
  Filled 2021-05-23: qty 5

## 2021-05-23 MED ORDER — ONDANSETRON HCL 4 MG/2ML IJ SOLN
INTRAMUSCULAR | Status: DC | PRN
  Administered 2021-05-23: 4 mg via INTRAVENOUS

## 2021-05-23 MED ORDER — CEFAZOLIN IN SODIUM CHLORIDE 3-0.9 GM/100ML-% IV SOLN
3.0000 g | INTRAVENOUS | Status: DC
  Filled 2021-05-23: qty 100

## 2021-05-23 MED ORDER — OXYCODONE HCL 5 MG PO TABS
ORAL_TABLET | ORAL | Status: AC
  Filled 2021-05-23: qty 1

## 2021-05-23 MED ORDER — OXYCODONE HCL 5 MG PO TABS: 5.0000 mg | ORAL_TABLET | ORAL | 0 refills | Status: DC | PRN

## 2021-05-23 SURGICAL SUPPLY — 56 items
ADH SKN CLS APL DERMABOND .7 (GAUZE/BANDAGES/DRESSINGS) ×2
BAG INFUSER PRESSURE 100CC (MISCELLANEOUS) IMPLANT
BAG SPEC RTRVL LRG 6X4 10 (ENDOMECHANICALS) ×2
CANNULA REDUC XI 12-8 STAPL (CANNULA) ×1
CANNULA REDUCER 12-8 DVNC XI (CANNULA) ×2 IMPLANT
CLIP LIGATING HEMO O LOK GREEN (MISCELLANEOUS) ×3 IMPLANT
CUP MEDICINE 2OZ PLAST GRAD ST (MISCELLANEOUS) ×2 IMPLANT
DECANTER SPIKE VIAL GLASS SM (MISCELLANEOUS) ×3 IMPLANT
DERMABOND ADVANCED (GAUZE/BANDAGES/DRESSINGS) ×1
DERMABOND ADVANCED .7 DNX12 (GAUZE/BANDAGES/DRESSINGS) ×2 IMPLANT
DRAPE ARM DVNC X/XI (DISPOSABLE) ×8 IMPLANT
DRAPE COLUMN DVNC XI (DISPOSABLE) ×2 IMPLANT
DRAPE DA VINCI XI ARM (DISPOSABLE) ×4
DRAPE DA VINCI XI COLUMN (DISPOSABLE) ×1
ELECT CAUTERY BLADE TIP 2.5 (TIP) ×3
ELECT REM PT RETURN 9FT ADLT (ELECTROSURGICAL) ×3
ELECTRODE CAUTERY BLDE TIP 2.5 (TIP) ×2 IMPLANT
ELECTRODE REM PT RTRN 9FT ADLT (ELECTROSURGICAL) ×2 IMPLANT
GLOVE SURG SYN 7.0 (GLOVE) ×18 IMPLANT
GLOVE SURG SYN 7.0 PF PI (GLOVE) ×4 IMPLANT
GLOVE SURG SYN 7.5  E (GLOVE) ×6
GLOVE SURG SYN 7.5 E (GLOVE) ×12 IMPLANT
GLOVE SURG SYN 7.5 PF PI (GLOVE) ×4 IMPLANT
GOWN STRL REUS W/ TWL LRG LVL3 (GOWN DISPOSABLE) ×8 IMPLANT
GOWN STRL REUS W/TWL LRG LVL3 (GOWN DISPOSABLE) ×18
IRRIGATOR SUCT 8 DISP DVNC XI (IRRIGATION / IRRIGATOR) IMPLANT
IRRIGATOR SUCTION 8MM XI DISP (IRRIGATION / IRRIGATOR)
IV NS 1000ML (IV SOLUTION)
IV NS 1000ML BAXH (IV SOLUTION) IMPLANT
KIT PINK PAD W/HEAD ARE REST (MISCELLANEOUS) ×3
KIT PINK PAD W/HEAD ARM REST (MISCELLANEOUS) ×2 IMPLANT
LABEL OR SOLS (LABEL) ×3 IMPLANT
MANIFOLD NEPTUNE II (INSTRUMENTS) ×3 IMPLANT
NEEDLE HYPO 22GX1.5 SAFETY (NEEDLE) ×3 IMPLANT
NS IRRIG 500ML POUR BTL (IV SOLUTION) ×3 IMPLANT
OBTURATOR OPTICAL STANDARD 8MM (TROCAR) ×1
OBTURATOR OPTICAL STND 8 DVNC (TROCAR) ×2
OBTURATOR OPTICALSTD 8 DVNC (TROCAR) ×2 IMPLANT
PACK LAP CHOLECYSTECTOMY (MISCELLANEOUS) ×3 IMPLANT
PENCIL ELECTRO HAND CTR (MISCELLANEOUS) ×3 IMPLANT
POUCH SPECIMEN RETRIEVAL 10MM (ENDOMECHANICALS) ×3 IMPLANT
SEAL CANN UNIV 5-8 DVNC XI (MISCELLANEOUS) ×6 IMPLANT
SEAL XI 5MM-8MM UNIVERSAL (MISCELLANEOUS) ×3
SET TUBE SMOKE EVAC HIGH FLOW (TUBING) ×3 IMPLANT
SOLUTION ELECTROLUBE (MISCELLANEOUS) ×3 IMPLANT
SPONGE T-LAP 18X18 ~~LOC~~+RFID (SPONGE) IMPLANT
SPONGE T-LAP 4X18 ~~LOC~~+RFID (SPONGE) ×3 IMPLANT
STAPLER CANNULA SEAL DVNC XI (STAPLE) ×2 IMPLANT
STAPLER CANNULA SEAL XI (STAPLE) ×1
SUT MNCRL AB 4-0 PS2 18 (SUTURE) ×4 IMPLANT
SUT VIC AB 3-0 SH 27 (SUTURE)
SUT VIC AB 3-0 SH 27X BRD (SUTURE) IMPLANT
SUT VICRYL 0 AB UR-6 (SUTURE) ×6 IMPLANT
TAPE TRANSPORE STRL 2 31045 (GAUZE/BANDAGES/DRESSINGS) ×3 IMPLANT
TROCAR BALLN GELPORT 12X130M (ENDOMECHANICALS) ×3 IMPLANT
WATER STERILE IRR 500ML POUR (IV SOLUTION) ×2 IMPLANT

## 2021-05-23 NOTE — Transfer of Care (Addendum)
Immediate Anesthesia Transfer of Care Note  Patient: Ashley Howell  Procedure(s) Performed: XI ROBOTIC ASSISTED LAPAROSCOPIC CHOLECYSTECTOMY INDOCYANINE GREEN FLUORESCENCE IMAGING (ICG)  Patient Location: PACU  Anesthesia Type:General  Level of Consciousness: sedated  Airway & Oxygen Therapy: Patient Spontanous Breathing and Patient connected to face mask oxygen  Post-op Assessment: Report given to RN and Post -op Vital signs reviewed and stable  Post vital signs: Reviewed and stable  Last Vitals:  Vitals Value Taken Time  BP    Temp    Pulse    Resp    SpO2      Last Pain:  Vitals:   05/23/21 0633  TempSrc: Temporal  PainSc: 0-No pain         Complications: No notable events documented.

## 2021-05-23 NOTE — Discharge Instructions (Signed)
AMBULATORY SURGERY  ?DISCHARGE INSTRUCTIONS ? ? ?The drugs that you were given will stay in your system until tomorrow so for the next 24 hours you should not: ? ?Drive an automobile ?Make any legal decisions ?Drink any alcoholic beverage ? ? ?You may resume regular meals tomorrow.  Today it is better to start with liquids and gradually work up to solid foods. ? ?You may eat anything you prefer, but it is better to start with liquids, then soup and crackers, and gradually work up to solid foods. ? ? ?Please notify your doctor immediately if you have any unusual bleeding, trouble breathing, redness and pain at the surgery site, drainage, fever, or pain not relieved by medication. ? ? ? ?Additional Instructions: ? ? ? ?Please contact your physician with any problems or Same Day Surgery at [phone], Monday through Friday 6 am to 4 pm, or [place] [facility] [place] Main number at [phone].  ?

## 2021-05-23 NOTE — Anesthesia Preprocedure Evaluation (Signed)
Anesthesia Evaluation  Patient identified by MRN, date of birth, ID band Patient awake    Reviewed: Allergy & Precautions, H&P , NPO status , reviewed documented beta blocker date and time   History of Anesthesia Complications (+) POST - OP SPINAL HEADACHE and history of anesthetic complications  Airway Mallampati: I  TM Distance: >3 FB Neck ROM: limited    Dental  (+) Teeth Intact, Dental Advidsory Given   Pulmonary neg shortness of breath, neg COPD, neg recent URI, Current Smoker and Patient abstained from smoking.,    Pulmonary exam normal        Cardiovascular Exercise Tolerance: Good hypertension, (-) angina(-) Past MI Normal cardiovascular exam(-) dysrhythmias (-) Valvular Problems/Murmurs  12/2018 ECHO IMPRESSIONS    1. Left ventricular ejection fraction, by visual estimation, is 60 to  65%. The left ventricle has normal function. Normal left ventricular size.  There is no left ventricular hypertrophy.  2. Global right ventricle has normal systolic function.The right  ventricular size is normal. No increase in right ventricular wall  thickness.  3. Left atrial size was normal.  4. Saline contrast bubble study was performed which was nagative. No ASD  noted. Low suspicion of PFO given negative contrast study, though a very  small PFO can not be definatively excluded.    Neuro/Psych  Headaches, neg Seizures PSYCHIATRIC DISORDERS Anxiety    GI/Hepatic negative GI ROS, Neg liver ROS, neg GERD  ,  Endo/Other  neg diabetesMorbid obesity  Renal/GU negative Renal ROS     Musculoskeletal   Abdominal   Peds  Hematology   Anesthesia Other Findings Past Medical History: No date: Anxiety No date: ASD (atrial septal defect) 05/19/2018: Benign essential hypertension, antepartum     Comment:  160/99 at anatomy scan 18 weeks   Pt sent to ER on               05/15/2018 resolved with rest   Elevated at u/s visit -                labetalol 100 mg bid initiated No date: Hx of cardiomegaly No date: Hypertension No date: PFO (patent foramen ovale)     Comment:  a. 12/2014 Echo: EF 55-60%, mildly dil LA. PFO w/o               significant shunting (reviewed by Dr. Mariah Milling 07/2018). No date: Shortness of breath dyspnea No date: Spinal headache  Past Surgical History: 10/28/2008: CESAREAN SECTION 02/22/2015: CESAREAN SECTION; N/A     Comment:  Procedure: CESAREAN SECTION;  Surgeon: Nadara Mustard,               MD;  Location: [facility] ORS;  Service: Obstetrics;                Laterality: N/A; 04/13/2010: CESAREAN SECTION 10/09/2018: CESAREAN SECTION; N/A     Comment:  Procedure: CESAREAN SECTION;  Surgeon: Natale Milch, MD;  Location: [facility] ORS;  Service:               Obstetrics;  Laterality: N/A;  BMI    Body Mass Index: 46.27 kg/m      Reproductive/Obstetrics negative OB ROS                             Anesthesia Physical  Anesthesia Plan  ASA: 3  Anesthesia Plan:  General   Post-op Pain Management:    Induction: Intravenous  PONV Risk Score and Plan: 4 or greater and Ondansetron, Treatment may vary due to age or medical condition, Dexamethasone, Midazolam and Promethazine  Airway Management Planned: Oral ETT  Additional Equipment:   Intra-op Plan:   Post-operative Plan: Extubation in OR  Informed Consent: I have reviewed the patients History and Physical, chart, labs and discussed the procedure including the risks, benefits and alternatives for the proposed anesthesia with the patient or authorized representative who has indicated his/her understanding and acceptance.     Dental Advisory Given  Plan Discussed with: CRNA  Anesthesia Plan Comments:         Anesthesia Quick Evaluation

## 2021-05-23 NOTE — Progress Notes (Signed)
°   05/23/21 0630  Clinical Encounter Type  Visited With Patient  Visit Type Initial;Pre-op   Chaplain provided emotional support through compassionate presence and spiritual support through prayer.

## 2021-05-23 NOTE — Op Note (Addendum)
°  Procedure Date:  05/23/2021  Pre-operative Diagnosis:  Gallstone pancreatitis  Post-operative Diagnosis: Gallstone pancreatitis  Procedure:  Robotic assisted cholecystectomy with ICG FireFly cholangiogram  Surgeon:  Howie Ill, MD  Assistant:  Frankey Poot, PA-S  Anesthesia:  General endotracheal  Estimated Blood Loss:  10 ml  Specimens:  gallbladder  Complications:  None  Indications for Procedure:  This is a 31 y.o. female who presents with abdominal pain and workup revealing gallstone pancreatitis.  The benefits, complications, treatment options, and expected outcomes were discussed with the patient. The risks of bleeding, infection, recurrence of symptoms, failure to resolve symptoms, bile duct damage, bile duct leak, retained common bile duct stone, bowel injury, and need for further procedures were all discussed with the patient and she was willing to proceed.  Description of Procedure: The patient was correctly identified in the preoperative area and brought into the operating room.  The patient was placed supine with VTE prophylaxis in place.  Appropriate time-outs were performed.  Anesthesia was induced and the patient was intubated.  Appropriate antibiotics were infused.  The abdomen was prepped and draped in a sterile fashion. An infraumbilical incision was made. A cutdown technique was used to enter the abdominal cavity without injury, and a 12 mm robotic port was inserted.  Pneumoperitoneum was obtained with appropriate opening pressures.  Three 8-mm ports were placed in the mid abdomen at the level of the umbilicus under direct visualization.  The DaVinci platform was docked, camera targeted, and instruments were placed under direct visualization.  The gallbladder was identified.  The fundus was grasped and retracted cephalad.  Adhesions were lysed bluntly and with electrocautery. The infundibulum was grasped and retracted laterally, exposing the peritoneum overlying  the gallbladder.  This was incised with electrocautery and extended on either side of the gallbladder.  FireFly cholangiogram was then obtained, and we were able to clearly identify the cystic duct and common bile duct.  The cystic duct and cystic artery were carefully dissected with combination of cautery and blunt dissection.  Both were clipped twice proximally and once distally, cutting in between.  The gallbladder was taken from the gallbladder fossa in a retrograde fashion with electrocautery. The gallbladder was placed in an Endocatch bag. The liver bed was inspected and any bleeding was controlled with electrocautery. The right upper quadrant was then inspected again revealing intact clips, no bleeding, and no ductal injury.  The 8 mm ports were removed under direct visualization and the 12 mm port was removed.  The Endocatch bag was brought out via the umbilical incision. The fascial opening was closed using 0 vicryl suture.  Local anesthetic was infused in all incisions and the incisions were closed with 4-0 Monocryl.  The wounds were cleaned and sealed with DermaBond.  The patient was emerged from anesthesia and extubated and brought to the recovery room for further management.  The patient tolerated the procedure well and all counts were correct at the end of the case.   Howie Ill, MD

## 2021-05-23 NOTE — H&P (Signed)
History of Present Illness: Ashley Howell is a 31 y.o. female presenting for follow-up of gallstone pancreatitis.  Patient was initially admitted on 03/26/2021 with gallstone pancreatitis, elevated LFTs, and elevated lipase.  MRCP was negative but it did show significant inflammation of the pancreas in the peripancreatic area with some fluid extending towards both gutters.  In light of this, it was decided to do interval cholecystectomy instead of proceeding with cholecystectomy during her admission.  The patient presents today for follow-up and reports that she has been doing very well.  She did have some mild pain episode on Christmas day but nothing severe as were prior to the emergency room the week prior.  Currently denies any further pain, nausea, vomiting.  She has been staying away from greasy foods and has been watching her diet very carefully.  Denies any fevers, chills, chest pain, shortness of breath.   Past Medical History:     Past Medical History:  Diagnosis Date   Anxiety     ASD (atrial septal defect)     Benign essential hypertension, antepartum 05/19/2018    160/99 at anatomy scan 18 weeks   Pt sent to ER on 05/15/2018 resolved with rest   Elevated at u/s visit - labetalol 100 mg bid initiated   Hx of cardiomegaly     Hypertension     PFO (patent foramen ovale)      a. 12/2014 Echo: EF 55-60%, mildly dil LA. PFO w/o significant shunting (reviewed by Dr. Mariah Milling 07/2018).   Shortness of breath dyspnea     Spinal headache        Past Surgical History:      Past Surgical History:  Procedure Laterality Date   CESAREAN SECTION   10/28/2008   CESAREAN SECTION N/A 02/22/2015    Procedure: CESAREAN SECTION;  Surgeon: Nadara Mustard, MD;  Location: [facility] ORS;  Service: Obstetrics;  Laterality: N/A;   CESAREAN SECTION   04/13/2010   CESAREAN SECTION N/A 10/09/2018    Procedure: CESAREAN SECTION;  Surgeon: Natale Milch, MD;  Location: [facility] ORS;  Service: Obstetrics;   Laterality: N/A;   CESAREAN SECTION WITH BILATERAL TUBAL LIGATION Bilateral 10/06/2020    Procedure: CESAREAN SECTION WITH BILATERAL TUBAL LIGATION;  Surgeon: Natale Milch, MD;  Location: [facility] ORS;  Service: Obstetrics;  Laterality: Bilateral;      Home Medications:        Prior to Admission medications   Medication Sig Start Date End Date Taking? Authorizing Provider  acetaminophen (TYLENOL) 325 MG tablet Take 2 tablets (650 mg total) by mouth every 6 (six) hours as needed for mild pain or moderate pain (or Fever >/= 101). 03/30/21   Yes Wieting, Richard, MD  amLODipine (NORVASC) 5 MG tablet Take 5 mg by mouth daily.     Yes [provider]      Allergies: No Known Allergies   Review of Systems: Review of Systems  Constitutional:  Negative for chills and fever.  HENT:  Negative for hearing loss.   Respiratory:  Negative for shortness of breath.   Cardiovascular:  Negative for chest pain.  Gastrointestinal:  Negative for abdominal pain, nausea and vomiting.  Genitourinary:  Negative for dysuria.  Musculoskeletal:  Negative for myalgias.  Skin:  Negative for rash.  Neurological:  Negative for dizziness.  Psychiatric/Behavioral:  Negative for depression.     Physical Exam BP (!) 162/96    Pulse 79    Temp 98.3 F (36.8 C) (Oral)  Ht 5\' 7"  (1.702 m)    Wt 277 lb 6.4 oz (125.8 kg)    LMP 04/14/2021    SpO2 98%    BMI 43.45 kg/m  CONSTITUTIONAL: No acute distress HEENT:  Normocephalic, atraumatic, extraocular motion intact. NECK: Trachea is midline, no jugular venous distention RESPIRATORY:  Lungs are clear, and breath sounds are equal bilaterally. Normal respiratory effort without pathologic use of accessory muscles. CARDIOVASCULAR: Heart is regular without murmurs, gallops, or rubs. GI: The abdomen is soft, obese, nondistended, nontender to palpation.  MUSCULOSKELETAL: Normal gait, no peripheral edema NEUROLOGIC:  Motor and sensation is grossly normal.  Cranial  nerves are grossly intact. PSYCH:  Alert and oriented to person, place and time. Affect is normal.   Labs/Imaging: None new since her discharge.   Assessment and Plan: This is a 31 y.o. female with a recent episode of gallstone pancreatitis in December with significant peripancreatic inflammation.   -Patient is scheduled for surgery today for robotic assisted cholecystectomy with ICG cholangiogram.  Surgery discussed at length with her again and all questions answered.    January, MD [facility]

## 2021-05-23 NOTE — Anesthesia Postprocedure Evaluation (Signed)
Anesthesia Post Note  Patient: Ashley Howell  Procedure(s) Performed: XI ROBOTIC ASSISTED LAPAROSCOPIC CHOLECYSTECTOMY (Abdomen) INDOCYANINE GREEN FLUORESCENCE IMAGING (ICG)  Patient location during evaluation: PACU Anesthesia Type: General Level of consciousness: awake and alert Pain management: pain level controlled Vital Signs Assessment: post-procedure vital signs reviewed and stable Respiratory status: spontaneous breathing, nonlabored ventilation, respiratory function stable and patient connected to nasal cannula oxygen Cardiovascular status: blood pressure returned to baseline and stable Postop Assessment: no apparent nausea or vomiting Anesthetic complications: no   No notable events documented.   Last Vitals:  Vitals:   05/23/21 1035 05/23/21 1048  BP: (!) 146/89 (!) 143/92  Pulse: 83 73  Resp: 18   Temp: 36.6 C (!) 36.3 C  SpO2: 95% 96%    Last Pain:  Vitals:   05/23/21 1048  TempSrc: Temporal  PainSc: 6                  Lenard Simmer

## 2021-05-23 NOTE — Anesthesia Procedure Notes (Signed)
Procedure Name: Intubation Date/Time: 05/23/2021 7:41 AM Performed by: Beverely Low, CRNA Pre-anesthesia Checklist: Patient identified, Patient being monitored, Timeout performed, Emergency Drugs available and Suction available Patient Re-evaluated:Patient Re-evaluated prior to induction Oxygen Delivery Method: Circle system utilized Preoxygenation: Pre-oxygenation with 100% oxygen Induction Type: IV induction Ventilation: Mask ventilation without difficulty Laryngoscope Size: Mac and 3 Grade View: Grade I Tube type: Oral Tube size: 7.0 mm Number of attempts: 1 Placement Confirmation: ETT inserted through vocal cords under direct vision, positive ETCO2 and breath sounds checked- equal and bilateral Secured at: 21 cm Tube secured with: Tape Dental Injury: Teeth and Oropharynx as per pre-operative assessment

## 2021-05-24 LAB — SURGICAL PATHOLOGY

## 2021-06-07 ENCOUNTER — Ambulatory Visit (INDEPENDENT_AMBULATORY_CARE_PROVIDER_SITE_OTHER): Payer: Medicaid Other | Admitting: Surgery

## 2021-06-07 ENCOUNTER — Other Ambulatory Visit: Payer: Self-pay

## 2021-06-07 ENCOUNTER — Encounter: Payer: Self-pay | Admitting: Surgery

## 2021-06-07 VITALS — BP 144/90 | HR 92 | Temp 98.7°F | Ht 67.0 in | Wt 276.4 lb

## 2021-06-07 DIAGNOSIS — K851 Biliary acute pancreatitis without necrosis or infection: Secondary | ICD-10-CM

## 2021-06-07 DIAGNOSIS — Z09 Encounter for follow-up examination after completed treatment for conditions other than malignant neoplasm: Secondary | ICD-10-CM

## 2021-06-07 DIAGNOSIS — K8020 Calculus of gallbladder without cholecystitis without obstruction: Secondary | ICD-10-CM

## 2021-06-07 NOTE — Patient Instructions (Addendum)
If you have any concerns or questions, please feel free to call our office. Follow up as needed.  ? ? ?GENERAL POST-OPERATIVE ?PATIENT INSTRUCTIONS  ? ?WOUND CARE INSTRUCTIONS:  Keep a dry clean dressing on the wound if there is drainage. The initial bandage may be removed after 24 hours.  Once the wound has quit draining you may leave it open to air.  If clothing rubs against the wound or causes irritation and the wound is not draining you may cover it with a dry dressing during the daytime.  Try to keep the wound dry and avoid ointments on the wound unless directed to do so.  If the wound becomes bright red and painful or starts to drain infected material that is not clear, please contact your physician immediately.  If the wound is mildly pink and has a thick firm ridge underneath it, this is normal, and is referred to as a healing ridge.  This will resolve over the next 4-6 weeks. ? ?BATHING: ?You may shower if you have been informed of this by your surgeon. However, Please do not submerge in a tub, hot tub, or pool until incisions are completely sealed or have been told by your surgeon that you may do so. ? ?DIET:  You may eat any foods that you can tolerate.  It is a good idea to eat a high fiber diet and take in plenty of fluids to prevent constipation.  If you do become constipated you may want to take a mild laxative or take ducolax tablets on a daily basis until your bowel habits are regular.  Constipation can be very uncomfortable, along with straining, after recent surgery. ? ?ACTIVITY:  You are encouraged to cough and deep breath or use your incentive spirometer if you were given one, every 15-30 minutes when awake.  This will help prevent respiratory complications and low grade fevers post-operatively if you had a general anesthetic.  You may want to hug a pillow when coughing and sneezing to add additional support to the surgical area, if you had abdominal or chest surgery, which will decrease pain  during these times.  You are encouraged to walk and engage in light activity for the next two weeks.  You should not lift more than 20 pounds, until 07/04/2021 as it could put you at increased risk for complications.  Twenty pounds is roughly equivalent to a plastic bag of groceries. At that time- Listen to your body when lifting, if you have pain when lifting, stop and then try again in a few days. Soreness after doing exercises or activities of daily living is normal as you get back in to your normal routine. ? ?MEDICATIONS:  Try to take narcotic medications and anti-inflammatory medications, such as tylenol, ibuprofen, naprosyn, etc., with food.  This will minimize stomach upset from the medication.  Should you develop nausea and vomiting from the pain medication, or develop a rash, please discontinue the medication and contact your physician.  You should not drive, make important decisions, or operate machinery when taking narcotic pain medication. ? ?SUNBLOCK ?Use sun block to incision area over the next year if this area will be exposed to sun. This helps decrease scarring and will allow you avoid a permanent darkened area over your incision. ? ?QUESTIONS:  Please feel free to call our office if you have any questions, and we will be glad to assist you.  ?

## 2021-06-07 NOTE — Progress Notes (Signed)
06/07/2021 ? ?HPI: ?Ashley Howell is a 31 y.o. female s/p robotic assisted cholecystectomy on 05/23/2021.  Patient presents today for follow-up.  She reports that she is doing very well without any pain issues, nausea, bowel issues or any concerns.  The only issue that she brings up today is that she was worried about the umbilical incision.  Denies any drainage or worsening pain but she noticed that the skin edges had separated. ? ?Vital signs: ?BP (!) 144/90   Pulse 92   Temp 98.7 ?F (37.1 ?C) (Oral)   Ht 5\' 7"  (1.702 m)   Wt 276 lb 6.4 oz (125.4 kg)   LMP 05/17/2021   SpO2 99%   BMI 43.29 kg/m?   ? ?Physical Exam: ?Constitutional: No acute distress ?Abdomen: Soft, nondistended, nontender to palpation.  Incisions are clean, dry, intact except for the umbilical incision which has a superficial dehiscence of the top layer of the skin of about 1 to 2 mm.  Unable to see any deeper layers so this is superficial.  No significant erythema or induration. ? ?Assessment/Plan: ?This is a 31 y.o. female s/p robotic assisted cholecystectomy. ? ?- Patient is healing very well and discussed with her that the umbilical incision is only superficially dehisced and there is no concern right now of any wound infection.  She may apply dry gauze dressing and may use Neosporin if she wants but this is not necessary.  The main thing would be a gauze dressing to cover the wound as a precaution as I see her scrub pants hit that area of the umbilical incision. ?- Otherwise she is healing very well and has not had any issues from her gallbladder surgery. ?- Follow-up as needed. ? ? ?26, MD ?[facility]  ?

## 2021-06-14 ENCOUNTER — Ambulatory Visit: Payer: Medicaid Other | Admitting: Cardiovascular Disease

## 2021-06-14 NOTE — Progress Notes (Unsigned)
Date:  06/14/2021   ID:  Ashley Howell, DOB Sep 26, 1990, MRN [id number]  Patient Location:  [place] [address]   Provider location:   [facility], [place] office  PCP:  [facility], Laurie  Cardiologist:  [facility]    History of Present Illness:    Ashley Howell is a 31 y.o. female is a 31 y.o. female past medical history of gestational hypertension previously on Labetalol 100 mg BID Who presents for f/u of her PFO/ASD    Echo 9/20 1. Left ventricular ejection fraction, by visual estimation, is 60 to  65%. The left ventricle has normal function. Normal left ventricular size.  There is no left ventricular hypertrophy.   2. Global right ventricle has normal systolic function.The right  ventricular size is normal. No increase in right ventricular wall  thickness.   3. Left atrial size was normal.   4. Saline contrast bubble study was performed which was nagative. No ASD  noted. Low suspicion of PFO given negative contrast study, though a very  small PFO can not be definatively excluded.   Prior echocardiogram [id number] with conflicting details, read by outside physician Details a PFO but also details possible ASD with shunting     In the hospital July 28, 2022 hypertension  Follow-up pressures were normal, she had a mild headache  She has 3 children  9, 8 3, yo children Reports relatively uncomplicated pregnancies All by C-sections  At baseline no significant shortness of breath or chest pain with exertion labetolol holding the pressures No swelling Has only required medication for blood pressure this pregnancy     Past Medical History:  Diagnosis Date   Anxiety    ASD (atrial septal defect)    Benign essential hypertension, antepartum 05/19/2018   160/99 at anatomy scan 18 weeks   Pt sent to ER on 05/15/2018 resolved with rest   Elevated at u/s visit - labetalol 100 mg bid initiated    Hx of cardiomegaly    Hypertension    PFO (patent foramen ovale)    a. 12/2014 Echo: EF 55-60%, mildly dil LA. PFO w/o significant shunting (reviewed by Dr. Rockey Situ 07/2018).   Shortness of breath dyspnea    Spinal headache    Past Surgical History:  Procedure Laterality Date   CESAREAN SECTION  10/28/2008   CESAREAN SECTION N/A 02/22/2015   Procedure: CESAREAN SECTION;  Surgeon: Gae Dry, MD;  Location: [facility] ORS;  Service: Obstetrics;  Laterality: N/A;   CESAREAN SECTION  04/13/2010   CESAREAN SECTION N/A 10/09/2018   Procedure: CESAREAN SECTION;  Surgeon: Homero Fellers, MD;  Location: [facility] ORS;  Service: Obstetrics;  Laterality: N/A;   CESAREAN SECTION WITH BILATERAL TUBAL LIGATION Bilateral 10/06/2020   Procedure: CESAREAN SECTION WITH BILATERAL TUBAL LIGATION;  Surgeon: Homero Fellers, MD;  Location: [facility] ORS;  Service: Obstetrics;  Laterality: Bilateral;   TUBAL LIGATION       No outpatient medications have been marked as taking for the 06/14/21 encounter (Appointment) with Minna Merritts, MD.     Allergies:   Patient has no known allergies.   Social History   Tobacco Use   Smoking status: Every Day    Packs/day: 0.50    Years: 2.00    Pack years: 1.00    Types: Cigarettes   Smokeless tobacco: Never  Vaping Use   Vaping Use: Never used  Substance Use  Topics   Alcohol use: Yes    Comment: occasionally   Drug use: No     Current Outpatient Medications on File Prior to Visit  Medication Sig Dispense Refill   acetaminophen (TYLENOL) 325 MG tablet Take 2 tablets (650 mg total) by mouth every 6 (six) hours as needed for mild pain or moderate pain (or Fever >/= 101).     acetaminophen (TYLENOL) 500 MG tablet Take 2 tablets (1,000 mg total) by mouth every 6 (six) hours as needed for mild pain.     amLODipine (NORVASC) 5 MG tablet Take 5 mg by mouth daily.     ibuprofen (ADVIL) 800 MG tablet Take 1 tablet (800 mg total) by mouth every 8 (eight) hours  as needed for moderate pain. 60 tablet 1   No current facility-administered medications on file prior to visit.     Family Hx: The patient's family history includes Cancer in her maternal grandfather; Hypertension in her mother; Stroke in her paternal aunt.  ROS:   Please see the history of present illness.    Review of Systems  Constitutional: Negative.   Respiratory: Negative.    Cardiovascular: Negative.   Gastrointestinal: Negative.   Musculoskeletal: Negative.   Neurological: Negative.   Psychiatric/Behavioral: Negative.    All other systems reviewed and are negative.    Labs/Other Tests and Data Reviewed:    Recent Labs: 03/28/2021: Hemoglobin 10.9; Platelets 300 03/30/2021: ALT 118; BUN 8; Creatinine, Ser 0.53; Potassium 3.5; Sodium 137   Recent Lipid Panel Lab Results  Component Value Date/Time   CHOL 166 03/26/2021 04:15 PM   TRIG 84 03/27/2021 09:34 AM   HDL 50 03/26/2021 04:15 PM   CHOLHDL 3.3 03/26/2021 04:15 PM   LDLCALC 101 (H) 03/26/2021 04:15 PM    Wt Readings from Last 3 Encounters:  06/07/21 276 lb 6.4 oz (125.4 kg)  05/23/21 272 lb (123.4 kg)  05/19/21 272 lb (123.4 kg)     Exam:    Vital Signs: Vital signs may also be detailed in the HPI LMP 05/17/2021   Wt Readings from Last 3 Encounters:  06/07/21 276 lb 6.4 oz (125.4 kg)  05/23/21 272 lb (123.4 kg)  05/19/21 272 lb (123.4 kg)   Temp Readings from Last 3 Encounters:  06/07/21 98.7 F (37.1 C) (Oral)  05/23/21 (!) 97.3 F (36.3 C) (Temporal)  04/19/21 98.3 F (36.8 C) (Oral)   BP Readings from Last 3 Encounters:  06/07/21 (!) 144/90  05/23/21 (!) 143/92  04/19/21 (!) 162/96   Pulse Readings from Last 3 Encounters:  06/07/21 92  05/23/21 73  04/19/21 79    Pulse 110 over 60s, pulse 80s, respirations 16  Well nourished, well developed female in no acute distress. Constitutional:  oriented to person, place, and time. No distress.  Head: Normocephalic and atraumatic.  Eyes:   no discharge. No scleral icterus.  Neck: Normal range of motion. Neck supple.  Pulmonary/Chest: No audible wheezing, no distress, appears comfortable Musculoskeletal: Normal range of motion.  no  tenderness or deformity.  Neurological:   Coordination normal. Full exam not performed Skin:  No rash Psychiatric:  normal mood and affect. behavior is normal. Thought content normal.    ASSESSMENT & PLAN:    Patent foramen ovale/PFO Conflicting reports detailed on prior echocardiogram 2016 by outside cardiologist, listing PFO and ASD We have obtained the images and I reviewed them personally myself -Minimal shunt if any noted indicating likely small PFO -Repeat study not indicated at this time  No further work-up needed prior to delivery No anticoagulation needed  Benign essential hypertension, antepartum Blood pressure is well controlled on today's visit. No changes made to the medications. Reports not requiring labetalol before this pregnancy  Pre-existing essential hypertension during pregnancy, antepartum Recent evaluation for hypertension in the setting of headache Further follow-up, blood pressure was stable [id number] systolic   [id number] Education: The signs and symptoms of COVID-19 were discussed with the patient and how to seek care for testing (follow up with PCP or arrange E-visit).  The importance of social distancing was discussed today.  Patient Risk:   After full review of this patients clinical status, I feel that they are at least moderate risk at this time.  Time:   Today, I have spent 25 minutes with the patient with telehealth technology discussing the cardiac and medical problems/diagnoses detailed above   10 min spent reviewing the chart prior to patient visit today   Medication Adjustments/Labs and Tests Ordered: Current medicines are reviewed at length with the patient today.  Concerns regarding medicines are outlined above.   Tests Ordered: No tests  ordered   Medication Changes: No changes made   Disposition: Follow-up as needed   Signed, Ida Rogue, MD  06/14/2021 7:50 AM    [facility] [address] [place] [address], [place], [place] [address]

## 2021-06-15 ENCOUNTER — Encounter: Payer: Self-pay | Admitting: Cardiovascular Disease

## 2021-06-19 ENCOUNTER — Telehealth: Payer: Self-pay | Admitting: Cardiovascular Disease

## 2021-06-19 NOTE — Telephone Encounter (Signed)
Attempted to call pt, phone rang straight to VM ?Milabox full, cannot LM ?Will attempt at later time ? ?Has not been seen by Dr. Rockey Situ since 2020, last seen Pence, NP 05/06/2020 ?

## 2021-06-19 NOTE — Telephone Encounter (Signed)
Patient c/o Palpitations:  High priority if patient c/o lightheadedness, shortness of breath, or chest pain ? ?How long have you had palpitations/irregular HR/ Afib? Are you having the symptoms now? 1 week  ? ?Are you currently experiencing lightheadedness, SOB or CP? No  ? ?Do you have a history of afib (atrial fibrillation) or irregular heart rhythm? Not sure per patient has hx septal defect  ? ?Have you checked your BP or HR? (document readings if available): no has hx htn and anxiety  ? ?Are you experiencing any other symptoms? Anxiety and stress  ?Scheduled 4/11 with [facility] and added to waitlist  ? ?

## 2021-06-20 NOTE — Telephone Encounter (Signed)
Attempted to reach out to Ashley Howell, unable to make contact ?LDM, Advised on sooner appt confirm 06/27/2021 at 9:00 am with Dr. Rockey Situ for her HTN and palpitations.  ? ?Can discuss her symptoms at time of appt, currently med list shows Norvasc 5 mg daily for HTN. Pt overdue for her yearly appt, can discuss her medications and need for additional meds during her OV.  ?

## 2021-06-21 ENCOUNTER — Other Ambulatory Visit: Payer: Self-pay

## 2021-06-21 ENCOUNTER — Emergency Department
Admission: EM | Admit: 2021-06-21 | Discharge: 2021-06-21 | Disposition: A | Discharge status: Home / Self Care | Payer: Medicaid Other | Attending: Emergency Medicine | Admitting: Emergency Medicine

## 2021-06-21 ENCOUNTER — Emergency Department: Payer: Medicaid Other

## 2021-06-21 DIAGNOSIS — Z20822 Contact with and (suspected) exposure to covid-19: Secondary | ICD-10-CM | POA: Insufficient documentation

## 2021-06-21 DIAGNOSIS — R0789 Other chest pain: Secondary | ICD-10-CM | POA: Insufficient documentation

## 2021-06-21 DIAGNOSIS — I10 Essential (primary) hypertension: Secondary | ICD-10-CM | POA: Diagnosis not present

## 2021-06-21 LAB — CBC
HCT: 39.1 % (ref 36.0–46.0)
Hemoglobin: 12.1 g/dL (ref 12.0–15.0)
MCH: 25.6 pg — ABNORMAL LOW (ref 26.0–34.0)
MCHC: 30.9 g/dL (ref 30.0–36.0)
MCV: 82.7 fL (ref 80.0–100.0)
Platelets: 315 10*3/uL (ref 150–400)
RBC: 4.73 MIL/uL (ref 3.87–5.11)
RDW: 15.2 % (ref 11.5–15.5)
WBC: 10 10*3/uL (ref 4.0–10.5)
nRBC: 0 % (ref 0.0–0.2)

## 2021-06-21 LAB — TROPONIN I (HIGH SENSITIVITY)
Troponin I (High Sensitivity): 5 ng/L (ref <18)
Troponin I (High Sensitivity): 4 ng/L (ref <18)

## 2021-06-21 LAB — BASIC METABOLIC PANEL
Anion gap: 7 (ref 5–15)
BUN: 13 mg/dL (ref 6–20)
CO2: 24 mmol/L (ref 22–32)
Calcium: 8.9 mg/dL (ref 8.9–10.3)
Chloride: 107 mmol/L (ref 98–111)
Creatinine, Ser: 0.75 mg/dL (ref 0.44–1.00)
GFR, Estimated: >60 mL/min (ref >60)
Glucose, Bld: 105 mg/dL — ABNORMAL HIGH (ref 70–99)
Potassium: 3.6 mmol/L (ref 3.5–5.1)
Sodium: 138 mmol/L (ref 135–145)

## 2021-06-21 LAB — RESP PANEL BY RT-PCR (FLU A&B, COVID) ARPGX2
Influenza A by PCR: NEGATIVE
Influenza B by PCR: NEGATIVE
SARS Coronavirus 2 by RT PCR: NEGATIVE

## 2021-06-21 LAB — HEPATIC FUNCTION PANEL
ALT: 19 U/L (ref 0–44)
AST: 27 U/L (ref 15–41)
Albumin: 3.4 g/dL — ABNORMAL LOW (ref 3.5–5.0)
Alkaline Phosphatase: 60 U/L (ref 38–126)
Bilirubin, Direct: 0.1 mg/dL (ref 0.0–0.2)
Indirect Bilirubin: 0.2 mg/dL — ABNORMAL LOW (ref 0.3–0.9)
Total Bilirubin: 0.3 mg/dL (ref 0.3–1.2)
Total Protein: 7.3 g/dL (ref 6.5–8.1)

## 2021-06-21 LAB — POC URINE PREG, ED: Preg Test, Ur: NEGATIVE

## 2021-06-21 LAB — LIPASE, BLOOD: Lipase: 21 U/L (ref 11–51)

## 2021-06-21 MED ORDER — IOHEXOL 350 MG/ML SOLN
75.0000 mL | Freq: Once | INTRAVENOUS | Status: AC | PRN
  Administered 2021-06-21: 75 mL via INTRAVENOUS

## 2021-06-21 MED ORDER — ASPIRIN 81 MG PO CHEW
324.0000 mg | CHEWABLE_TABLET | Freq: Once | ORAL | Status: AC
  Administered 2021-06-21: 324 mg via ORAL
  Filled 2021-06-21: qty 4

## 2021-06-21 NOTE — ED Provider Notes (Signed)
? ?[facility] ?Provider Note ? ? ? Event Date/Time  ? First MD Initiated Contact with Patient 06/21/21 1332   ?  (approximate) ? ? ?History  ? ?Chest Pain and Shortness of Breath ? ? ?HPI ? ?Ashley Howell is a 31 y.o. female recent cholecystectomy, atrial septal defect, hypertension, cardiomegaly patent foramen ovale ? ?For about 1 week now she has been experiencing an intermittent chest discomfort.  It comes and goes does not last long does not seem to be associated with exertion.  Hard to describe it feels like a slight pressure feeling in the center of her chest that does not radiate.  No abdominal pain except she had occasional pain since surgery in her right upper quadrant will come and go, very minimal and currently not experiencing any pain in the abdomen ? ?No shortness of breath.  No nausea vomiting no fevers or chills no cough or respiratory illness ? ?Currently reports a slight achy discomfort across her chest centrally.  ? ?  ? ? ?Physical Exam  ? ?Triage Vital Signs: ?ED Triage Vitals  ?Enc Vitals Group  ?   BP 06/21/21 1227 (!) 149/93  ?   Pulse Rate 06/21/21 1227 88  ?   Resp 06/21/21 1227 17  ?   Temp 06/21/21 1226 98.7 ?F (37.1 ?C)  ?   Temp Source 06/21/21 1226 Oral  ?   SpO2 06/21/21 1227 100 %  ?   Weight --   ?   Height --   ?   Head Circumference --   ?   Peak Flow --   ?   Pain Score 06/21/21 1224 7  ?   Pain Loc --   ?   Pain Edu? --   ?   Excl. in GC? --   ? ? ?Most recent vital signs: ?Vitals:  ? 06/21/21 1226 06/21/21 1227  ?BP:  (!) 149/93  ?Pulse:  88  ?Resp:  17  ?Temp: 98.7 ?F (37.1 ?C)   ?SpO2:  100%  ? ? ? ?General: Awake, no distress.  Very pleasant, seated in hallway bed without distress. ?CV:  Good peripheral perfusion.  Normal heart tones no murmurs rubs or gallops.  Normal rate ?Resp:  Normal effort.  Clear lung sounds.  Denies pleuritic pain.  Speaks in full clear sentences ?Abd:  No distention.  No pain to palpation across all abdominal quadrants.   Particularly no pain in the right upper quadrant. ?Other:  No noted lower extremity edema. ? ? ?ED Results / Procedures / Treatments  ? ?Labs ?(all labs ordered are listed, but only abnormal results are displayed) ?Labs Reviewed  ?BASIC METABOLIC PANEL - Abnormal; Notable for the following components:  ?    Result Value  ? Glucose, Bld 105 (*)   ? All other components within normal limits  ?CBC - Abnormal; Notable for the following components:  ? MCH 25.6 (*)   ? All other components within normal limits  ?HEPATIC FUNCTION PANEL - Abnormal; Notable for the following components:  ? Albumin 3.4 (*)   ? Indirect Bilirubin 0.2 (*)   ? All other components within normal limits  ?RESP PANEL BY RT-PCR (FLU A&B, COVID) ARPGX2  ?LIPASE, BLOOD  ?POC URINE PREG, ED  ?TROPONIN I (HIGH SENSITIVITY)  ?TROPONIN I (HIGH SENSITIVITY)  ? ? ? ?EKG ? ?Reviewed inter by me at 1230 ?Heart rate 90 ?QRS 80 ?QTc 440 ?Normal sinus rhythm, suspicious for early repolarization with repolarization abnormality.  Compared with EKG previous EKGs there is notable ST segment abnormality now present in inferolateral leads.  New EKG changes present ----  ? ?NO STEMI ? ? ? ?RADIOLOGY ? ?Chest x-ray is personally viewed and interpreted by me as negative for acute ? ? ? ? ?PROCEDURES: ? ?Critical Care performed: No ? ?Procedures ? ? ?MEDICATIONS ORDERED IN ED: ?Medications  ?aspirin chewable tablet 324 mg (324 mg Oral Given 06/21/21 1417)  ?iohexol (OMNIPAQUE) 350 MG/ML injection 75 mL (75 mLs Intravenous Contrast Given 06/21/21 1433)  ? ? ? ?IMPRESSION / MDM / ASSESSMENT AND PLAN / ED COURSE  ?I reviewed the triage vital signs and the nursing notes. ?             ?               ? ?Differential diagnosis includes, but is not limited to, ACS, aortic dissection, pulmonary embolism, cardiac tamponade, pneumothorax, pneumonia, pericarditis, myocarditis, GI-related causes including esophagitis/gastritis, and musculoskeletal chest wall pain.  Given the patient's  recent surgery, discussed with the patient we will proceed with CT scan of the chest to exclude pulmonary embolism.  She does have mild nonspecific T wave abnormality, however her first troponin is normal and her symptoms have been intermittent for about a week's time.  She does have a cardiac history but not of coronary disease but of structural abnormalities ? ? ?----------------------------------------- ?3:50 PM on 06/21/2021 ?----------------------------------------- ?In comparison with more distant EKGs, noted that the patient does have pre-existing T wave inversions consistent with today's EKG.  I discussed her case with Dr. Joylene Igo or of cardiology, he advised outpatient follow-up appropriate.  Today 2 negative troponins patient resting comfortably without distress CT PE negative.  Reassuring exam. ? ? ? ?The patient is on the cardiac monitor to evaluate for evidence of arrhythmia and/or significant heart rate changes. ? ? ? ?Reviewed patient's recent surgical note, had gallstone pancreatitis treated with cholecystectomy May 23, 2021 ? ? ?Return precautions and treatment recommendations and follow-up discussed with the patient who is agreeable with the plan. ? ?HEART pathway, low risk. ? ?FINAL CLINICAL IMPRESSION(S) / ED DIAGNOSES  ? ?Final diagnoses:  ?Atypical chest pain  ? ? ? ?Rx / DC Orders  ? ?ED Discharge Orders   ? ? None  ? ?  ? ? ? ?Note:  This document was prepared using Dragon voice recognition software and may include unintentional dictation errors. ?  Sharyn Creamer, MD ?06/21/21 1551 ? ?

## 2021-06-21 NOTE — ED Notes (Signed)
Pt. Ambulated to bathroom indep. NAD. Denies SOB, CP, or palpitations. ?

## 2021-06-21 NOTE — ED Triage Notes (Addendum)
Pt comes with c/o SOb and CP for the last week. Pt states she has hx of atrial septal defect. Pt states no prescribed meds. ? ?Pt states she has been having palpitations. ?

## 2021-06-26 NOTE — Progress Notes (Deleted)
?  ?  ? ? ? ?Date:  06/26/2021  ? ?ID:  ANUJA GREESON, DOB [birth year], MRN [id number] ? ?Patient Location:  ?[place]  [id number]  ? ?Provider location:   ?[place], [employer] office ? ?PCP:  [facility] [place]  ?Cardiologist:  [facility] ? ? ? ?History of Present Illness:   ? ?Ashley Howell is a 31 y.o. female is a 31 y.o. female past medical history of ?gestational hypertension previously on Labetalol 100 mg BID ?Who presents for f/u of her PFO/ASD ? ? ? ?Echo 9/20 ?1. Left ventricular ejection fraction, by visual estimation, is 60 to  ?65%. The left ventricle has normal function. Normal left ventricular size.  ?There is no left ventricular hypertrophy.  ? 2. Global right ventricle has normal systolic function.The right  ?ventricular size is normal. No increase in right ventricular wall  ?thickness.  ? 3. Left atrial size was normal.  ? 4. Saline contrast bubble study was performed which was nagative. No ASD  ?noted. Low suspicion of PFO given negative contrast study, though a very  ?small PFO can not be definatively excluded. ? ? ?Prior echocardiogram [id number] with conflicting details, read by outside physician ?Details a PFO but also details possible ASD with shunting ? ? ? ? ?In the hospital July 28, 2022 hypertension  ?Follow-up pressures were normal, she had a mild headache ? ?She has 3 children  ?9, 8 3, yo children ?Reports relatively uncomplicated pregnancies ?All by C-sections ? ?At baseline no significant shortness of breath or chest pain with exertion ?labetolol holding the pressures ?No swelling ?Has only required medication for blood pressure this pregnancy ? ? ? ? ?Past Medical History:  ?Diagnosis Date  ? Anxiety   ? ASD (atrial septal defect)   ? Benign essential hypertension, antepartum 05/19/2018  ? 160/99 at anatomy scan 18 weeks   Pt sent to ER on 05/15/2018 resolved with rest   Elevated at u/s visit - labetalol 100 mg bid  initiated  ? Hx of cardiomegaly   ? Hypertension   ? PFO (patent foramen ovale)   ? a. 12/2014 Echo: EF 55-60%, mildly dil LA. PFO w/o significant shunting (reviewed by Dr. Rockey Situ 07/2018).  ? Shortness of breath dyspnea   ? Spinal headache   ? ?Past Surgical History:  ?Procedure Laterality Date  ? CESAREAN SECTION  10/28/2008  ? CESAREAN SECTION N/A 02/22/2015  ? Procedure: CESAREAN SECTION;  Surgeon: Gae Dry, MD;  Location: [facility] OR[facility];  Service: Obstetrics;  Laterality: N/A;  ? CESAREAN SECTION  04/13/2010  ? CESAREAN SECTION N/A 10/09/2018  ? Procedure: CESAREAN SECTION;  Surgeon: Homero Fellers, MD;  Location: [facility] OR[facility];  Service: Obstetrics;  Laterality: N/A;  ? CESAREAN SECTION WITH BILATERAL TUBAL LIGATION Bilateral 10/06/2020  ? Procedure: CESAREAN SECTION WITH BILATERAL TUBAL LIGATION;  Surgeon: Homero Fellers, MD;  Location: [facility] OR[facility];  Service: Obstetrics;  Laterality: Bilateral;  ? TUBAL LIGATION    ?  ? ?No outpatient medications have been marked as taking for the 06/27/21 encounter (Appointment) with Minna Merritts, MD.  ?  ? ?Allergies:   Patient has no known allergies.  ? ?Social History  ? ?Tobacco Use  ? Smoking status: Every Day  ?  Packs/day: 0.50  ?  Years: 2.00  ?  Pack years: 1.00  ?  Types: Cigarettes  ? Smokeless tobacco: Never  ?Vaping Use  ? Vaping Use: Never used  ?Substance Use  Topics  ? Alcohol use: Yes  ?  Comment: occasionally  ? Drug use: No  ?  ? ?Current Outpatient Medications on File Prior to Visit  ?Medication Sig Dispense Refill  ? acetaminophen (TYLENOL) 500 MG tablet Take 2 tablets (1,000 mg total) by mouth every 6 (six) hours as needed for mild pain.    ? amLODipine (NORVASC) 5 MG tablet Take 5 mg by mouth daily.    ? ibuprofen (ADVIL) 800 MG tablet Take 1 tablet (800 mg total) by mouth every 8 (eight) hours as needed for moderate pain. 60 tablet 1  ? ?No current facility-administered medications on file prior to visit.  ?  ? ?Family Hx: ?The patient's  family history includes Cancer in her maternal grandfather; Hypertension in her mother; Stroke in her paternal aunt. ? ?ROS:   ?Please see the history of present illness.    ?Review of Systems  ?Constitutional: Negative.   ?Respiratory: Negative.    ?Cardiovascular: Negative.   ?Gastrointestinal: Negative.   ?Musculoskeletal: Negative.   ?Neurological: Negative.   ?Psychiatric/Behavioral: Negative.    ?All other systems reviewed and are negative.  ? ? ?Labs/Other Tests and Data Reviewed:   ? ?Recent Labs: ?06/21/2021: ALT 19; BUN 13; Creatinine, Ser 0.75; Hemoglobin 12.1; Platelets 315; Potassium 3.6; Sodium 138  ? ?Recent Lipid Panel ?Lab Results  ?Component Value Date/Time  ? CHOL 166 03/26/2021 04:15 PM  ? TRIG 84 03/27/2021 09:34 AM  ? HDL 50 03/26/2021 04:15 PM  ? CHOLHDL 3.3 03/26/2021 04:15 PM  ? LDLCALC 101 (H) 03/26/2021 04:15 PM  ? ? ?Wt Readings from Last 3 Encounters:  ?06/07/21 276 lb 6.4 oz (125.4 kg)  ?05/23/21 272 lb (123.4 kg)  ?05/19/21 272 lb (123.4 kg)  ?  ? ?Exam:   ? ?Vital Signs: Vital signs may also be detailed in the HPI ?LMP 05/30/2021 Comment: tubal ligation  ?Wt Readings from Last 3 Encounters:  ?06/07/21 276 lb 6.4 oz (125.4 kg)  ?05/23/21 272 lb (123.4 kg)  ?05/19/21 272 lb (123.4 kg)  ? ?Temp Readings from Last 3 Encounters:  ?06/21/21 98.7 ?F (37.1 ?C) (Oral)  ?06/07/21 98.7 ?F (37.1 ?C) (Oral)  ?05/23/21 (!) 97.3 ?F (36.3 ?C) (Temporal)  ? ?BP Readings from Last 3 Encounters:  ?06/21/21 (!) 136/98  ?06/07/21 (!) 144/90  ?05/23/21 (!) 143/92  ? ?Pulse Readings from Last 3 Encounters:  ?06/21/21 72  ?06/07/21 92  ?05/23/21 73  ?  ?Pulse 110 over 60s, pulse 80s, respirations 16 ? ?Well nourished, well developed female in no acute distress. ?Constitutional:  oriented to person, place, and time. No distress.  ?Head: Normocephalic and atraumatic.  ?Eyes:  no discharge. No scleral icterus.  ?Neck: Normal range of motion. Neck supple.  ?Pulmonary/Chest: No audible wheezing, no distress,  appears comfortable ?Musculoskeletal: Normal range of motion.  no  tenderness or deformity.  ?Neurological:   Coordination normal. Full exam not performed ?Skin:  No rash ?Psychiatric:  normal mood and affect. behavior is normal. Thought content normal.  ? ? ?ASSESSMENT & PLAN:   ? ?Patent foramen ovale/PFO ?Conflicting reports detailed on prior echocardiogram 2016 by outside cardiologist, listing PFO and ASD ?We have obtained the images and I reviewed them personally myself ?-Minimal shunt if any noted indicating likely small PFO ?-Repeat study not indicated at this time ?No further work-up needed prior to delivery ?No anticoagulation needed ? ?Benign essential hypertension, antepartum ?Blood pressure is well controlled on today's visit. No changes made to the medications. ?Reports not requiring  labetalol before this pregnancy ? ?Pre-existing essential hypertension during pregnancy, antepartum ?Recent evaluation for hypertension in the setting of headache ?Further follow-up, blood pressure was stable [id number] systolic ? ? ?[id number] Education: ?The signs and symptoms of COVID-19 were discussed with the patient and how to seek care for testing (follow up with PCP or arrange E-visit).  The importance of social distancing was discussed today. ? ?Patient Risk:   ?After full review of this patients clinical status, I feel that they are at least moderate risk at this time. ? ?Time:   ?Today, I have spent 25 minutes with the patient with telehealth technology discussing the cardiac and medical problems/diagnoses detailed above   ?10 min spent reviewing the chart prior to patient visit today ? ? ?Medication Adjustments/Labs and Tests Ordered: ?Current medicines are reviewed at length with the patient today.  Concerns regarding medicines are outlined above.  ? ?Tests Ordered: ?No tests ordered ? ? ?Medication Changes: ?No changes made ? ? ?Disposition: Follow-up as needed ? ? ?Signed, ?Ida Rogue, MD  ?06/26/2021 6:42 PM     ?[facility] ?[place] ?[address], [place],  [address] ? ? ? ? ?

## 2021-06-27 ENCOUNTER — Ambulatory Visit: Payer: Medicaid Other | Admitting: Cardiovascular Disease

## 2021-06-27 DIAGNOSIS — Q2112 Patent foramen ovale: Secondary | ICD-10-CM

## 2021-06-27 DIAGNOSIS — I10 Essential (primary) hypertension: Secondary | ICD-10-CM

## 2021-06-27 DIAGNOSIS — Z720 Tobacco use: Secondary | ICD-10-CM

## 2021-06-27 DIAGNOSIS — Z6841 Body Mass Index (BMI) 40.0 and over, adult: Secondary | ICD-10-CM

## 2021-06-28 ENCOUNTER — Encounter: Payer: Self-pay | Admitting: Cardiovascular Disease

## 2021-07-10 ENCOUNTER — Ambulatory Visit: Payer: Medicaid Other | Admitting: Licensed Practical Nurse

## 2021-07-11 ENCOUNTER — Ambulatory Visit (INDEPENDENT_AMBULATORY_CARE_PROVIDER_SITE_OTHER): Payer: Medicaid Other | Admitting: Licensed Practical Nurse

## 2021-07-11 ENCOUNTER — Other Ambulatory Visit (HOSPITAL_COMMUNITY)
Admission: RE | Admit: 2021-07-11 | Discharge: 2021-07-11 | Disposition: A | Discharge status: Home / Self Care | Payer: Medicaid Other | Source: Ambulatory Visit | Attending: Licensed Practical Nurse | Admitting: Licensed Practical Nurse

## 2021-07-11 VITALS — BP 128/70 | Ht 67.0 in | Wt 277.0 lb

## 2021-07-11 DIAGNOSIS — N760 Acute vaginitis: Secondary | ICD-10-CM | POA: Diagnosis not present

## 2021-07-11 DIAGNOSIS — Z113 Encounter for screening for infections with a predominantly sexual mode of transmission: Secondary | ICD-10-CM | POA: Diagnosis not present

## 2021-07-11 DIAGNOSIS — N898 Other specified noninflammatory disorders of vagina: Secondary | ICD-10-CM

## 2021-07-11 DIAGNOSIS — B9689 Other specified bacterial agents as the cause of diseases classified elsewhere: Secondary | ICD-10-CM

## 2021-07-11 MED ORDER — METRONIDAZOLE 500 MG PO TABS: 500.0000 mg | ORAL_TABLET | Freq: Two times a day (BID) | ORAL | 0 refills | Status: DC

## 2021-07-11 NOTE — Progress Notes (Signed)
?  Gynecology STD Evaluation  ? ?Chief Complaint:  ?Chief Complaint  ?Patient presents with  ? STD Screening  ? ? ?History of Present Illness: Patient is a 31 y.o. [id number] presents for STD testing.  The patient has not noted intermenstrual spotting,  has not experienced postcoital bleeding, and does report increased vaginal discharge and odor x 1 month  There is a history of prior sexually transmitted infection(s).   ? ? ?The patient is sexually active with 1 female partner x 2 years. She currently uses Female sterilization: Present: yes for contraception. no, the patient also relies on condoms to prevent the spread of sexually transmitted infections.  She patient has been previously transfused or tattooed.   ? ?PMHx: ?She  has a past medical history of Anxiety, ASD (atrial septal defect), Benign essential hypertension, antepartum (05/19/2018), cardiomegaly, Hypertension, PFO (patent foramen ovale), Shortness of breath dyspnea, and Spinal headache. Also,  has a past surgical history that includes Cesarean section (10/28/2008); Cesarean section (N/A, 02/22/2015); Cesarean section (04/13/2010); Cesarean section (N/A, 10/09/2018); Cesarean section with bilateral tubal ligation (Bilateral, 10/06/2020); and Tubal ligation., family history includes Cancer in her maternal grandfather; Hypertension in her mother; Stroke in her paternal aunt.,  reports that she has been smoking cigarettes. She has a 1.00 pack-year smoking history. She has never used smokeless tobacco. She reports current alcohol use. She reports that she does not use drugs. ? ?She has a current medication list which includes the following prescription(s): acetaminophen, amlodipine, and ibuprofen. Also, has No Known Allergies. ? ?Review of Systems  ?Constitutional:  Negative for chills and fever.  ?Gastrointestinal: Negative.   ?Genitourinary: Negative.  Negative for dysuria.  ?Psychiatric/Behavioral: Negative.    ? ?Objective: ?There were no vitals taken for  this visit. ?Physical Exam ?Constitutional:   ?   Appearance: Normal appearance.  ?Genitourinary:  ?   Vulva normal.  ?   Genitourinary Comments: Cervix pink, no lesions, white discharge present  ?Abdominal:  ?   Tenderness: There is no abdominal tenderness.  ?Neurological:  ?   Mental Status: She is alert.  ?Wet prep: pos ph change, clue and whiff, neg trich and hyphea  ? ? ?Assessment: 31 y.o. [id number] No problem-specific Assessment & Plan notes found for this encounter. ? ? ?Plan: ?Problem List Items Addressed This Visit   ?None ? ? ? ?1) Contraception - BTL ? ?2) STI screening was offered collected  ? ?3) BV: script for flagyl sent to pharmacy  ? ?Carie Caddy, CNM  ?Domingo Pulse, [facility]  ?07/12/21  ?7:26 PM  ? ? ? ?

## 2021-07-12 LAB — HEP, RPR, HIV PANEL
HIV Screen 4th Generation wRfx: NONREACTIVE
Hepatitis B Surface Ag: NEGATIVE
RPR Ser Ql: NONREACTIVE

## 2021-07-12 LAB — HEPATITIS C ANTIBODY: Hep C Virus Ab: NONREACTIVE

## 2021-07-13 LAB — CERVICOVAGINAL ANCILLARY ONLY
Bacterial Vaginitis (gardnerella): POSITIVE — AB
Candida Glabrata: NEGATIVE
Candida Vaginitis: NEGATIVE
Chlamydia: NEGATIVE
Comment: NEGATIVE
Comment: NEGATIVE
Comment: NEGATIVE
Comment: NEGATIVE
Comment: NEGATIVE
Comment: NORMAL
Neisseria Gonorrhea: NEGATIVE
Trichomonas: POSITIVE — AB

## 2021-07-17 ENCOUNTER — Encounter: Payer: Self-pay | Admitting: Licensed Practical Nurse

## 2021-07-17 ENCOUNTER — Other Ambulatory Visit: Payer: Self-pay | Admitting: Licensed Practical Nurse

## 2021-07-17 DIAGNOSIS — B3731 Acute candidiasis of vulva and vagina: Secondary | ICD-10-CM

## 2021-07-17 MED ORDER — FLUCONAZOLE 150 MG PO TABS: 150.0000 mg | ORAL_TABLET | ORAL | 0 refills | Status: AC

## 2021-07-17 NOTE — Progress Notes (Signed)
Treated for BV and trich, now with thick white clumpy discharge.  Script for Diflucan sent to pharmacy. ?Roberto Scales, CNM  ?Mosetta Pigeon, [place]  ?07/17/21  ?11:52 AM  ? ? ?

## 2021-07-18 ENCOUNTER — Ambulatory Visit: Payer: Medicaid Other | Admitting: Cardiovascular Disease

## 2021-07-26 NOTE — Telephone Encounter (Signed)
Scheduled since noted and no show. Mailed letter so closing this encounter.  ?

## 2021-08-03 LAB — FETAL NONSTRESS TEST

## 2021-10-20 ENCOUNTER — Telehealth: Payer: Self-pay | Admitting: Cardiovascular Disease

## 2021-10-20 NOTE — Telephone Encounter (Signed)
3 attempts to schedule fu appt from recall list.   Deleting recall.   

## 2021-11-26 ENCOUNTER — Other Ambulatory Visit: Payer: Self-pay

## 2021-11-26 ENCOUNTER — Emergency Department: Payer: Medicaid Other

## 2021-11-26 ENCOUNTER — Emergency Department
Admission: EM | Admit: 2021-11-26 | Discharge: 2021-11-26 | Disposition: A | Discharge status: Home / Self Care | Payer: Medicaid Other | Attending: Emergency Medicine | Admitting: Emergency Medicine

## 2021-11-26 DIAGNOSIS — E876 Hypokalemia: Secondary | ICD-10-CM | POA: Diagnosis not present

## 2021-11-26 DIAGNOSIS — F439 Reaction to severe stress, unspecified: Secondary | ICD-10-CM | POA: Insufficient documentation

## 2021-11-26 DIAGNOSIS — I951 Orthostatic hypotension: Secondary | ICD-10-CM

## 2021-11-26 DIAGNOSIS — R000 Tachycardia, unspecified: Secondary | ICD-10-CM | POA: Diagnosis not present

## 2021-11-26 DIAGNOSIS — R42 Dizziness and giddiness: Secondary | ICD-10-CM | POA: Insufficient documentation

## 2021-11-26 LAB — TROPONIN I (HIGH SENSITIVITY)
Troponin I (High Sensitivity): 3 ng/L (ref <18)
Troponin I (High Sensitivity): 3 ng/L (ref <18)

## 2021-11-26 LAB — CBC
HCT: 40.7 % (ref 36.0–46.0)
Hemoglobin: 12.8 g/dL (ref 12.0–15.0)
MCH: 25.7 pg — ABNORMAL LOW (ref 26.0–34.0)
MCHC: 31.4 g/dL (ref 30.0–36.0)
MCV: 81.6 fL (ref 80.0–100.0)
Platelets: 350 10*3/uL (ref 150–400)
RBC: 4.99 MIL/uL (ref 3.87–5.11)
RDW: 14.6 % (ref 11.5–15.5)
WBC: 8.9 10*3/uL (ref 4.0–10.5)
nRBC: 0 % (ref 0.0–0.2)

## 2021-11-26 LAB — BASIC METABOLIC PANEL
Anion gap: 6 (ref 5–15)
BUN: 13 mg/dL (ref 6–20)
CO2: 24 mmol/L (ref 22–32)
Calcium: 9.4 mg/dL (ref 8.9–10.3)
Chloride: 110 mmol/L (ref 98–111)
Creatinine, Ser: 0.76 mg/dL (ref 0.44–1.00)
GFR, Estimated: >60 mL/min (ref >60)
Glucose, Bld: 99 mg/dL (ref 70–99)
Potassium: 3.4 mmol/L — ABNORMAL LOW (ref 3.5–5.1)
Sodium: 140 mmol/L (ref 135–145)

## 2021-11-26 LAB — POC URINE PREG, ED: Preg Test, Ur: NEGATIVE

## 2021-11-26 MED ORDER — POTASSIUM CHLORIDE CRYS ER 20 MEQ PO TBCR
40.0000 meq | EXTENDED_RELEASE_TABLET | Freq: Once | ORAL | Status: AC
  Administered 2021-11-26: 40 meq via ORAL
  Filled 2021-11-26: qty 2

## 2021-11-26 NOTE — ED Triage Notes (Signed)
Dizziness and weakness x2 weeks, worsening for the last two days. Chest pain for the last two days. Denies fevers. Hx of ASD.

## 2021-11-26 NOTE — ED Provider Notes (Signed)
[facility] Provider Note    Event Date/Time   First MD Initiated Contact with Patient 11/26/21 2051     (approximate)   History   Dizziness   HPI  Ashley Howell is a 31 y.o. female who presents to the ED for evaluation of Dizziness   I reviewed ED visit for atypical chest pain in March, cardiology clinic tried calling 3 times to schedule an appointment but she never followed up.  Patient is s/p cholecystectomy, morbidly obese  Patient presents to the ED for evaluation of 2 weeks of presyncopal dizziness with standing.  She reports that she is under a lot of stress at home with her 5 kids and working full-time.  Reports she feels some chest discomfort a few days ago, but nothing persistent.  No chest pain with exertion, no shortness of breath, cough, fever, syncope, abdominal pain, emesis, weakness or numbness to the extremities, falls  Physical Exam   Triage Vital Signs: ED Triage Vitals  Enc Vitals Group     BP 11/26/21 1630 (!) 118/106     Pulse Rate 11/26/21 1630 (!) 114     Resp 11/26/21 1630 16     Temp 11/26/21 1630 98.5 F (36.9 C)     Temp Source 11/26/21 1630 Oral     SpO2 11/26/21 1630 95 %     Weight 11/26/21 1625 280 lb (127 kg)     Height --      Head Circumference --      Peak Flow --      Pain Score 11/26/21 1625 8     Pain Loc --      Pain Edu? --      Excl. in GC? --     Most recent vital signs: Vitals:   11/26/21 2040 11/26/21 2130  BP: 132/86 (!) 146/88  Pulse: 88 80  Resp: 18 18  Temp:    SpO2: 99% 100%    General: Awake, no distress.  Morbidly obese.  Well-appearing.  Stands independently and reports no symptoms. CV:  Good peripheral perfusion. RRR Resp:  Normal effort. CTAB Abd:  No distention. Soft and benign MSK:  No deformity noted. No lower extremity swelling. Neuro:  No focal deficits appreciated. Cranial nerves II through XII intact 5/5 strength and sensation in all 4 extremities Other:     ED  Results / Procedures / Treatments   Labs (all labs ordered are listed, but only abnormal results are displayed) Labs Reviewed  BASIC METABOLIC PANEL - Abnormal; Notable for the following components:      Result Value   Potassium 3.4 (*)    All other components within normal limits  CBC - Abnormal; Notable for the following components:   MCH 25.7 (*)    All other components within normal limits  POC URINE PREG, ED  TROPONIN I (HIGH SENSITIVITY)  TROPONIN I (HIGH SENSITIVITY)    EKG Sinus tachycardia with a rate of 102 bpm.  Normal axis and intervals.  T wave inversions to inferior and lateral leads.  No STEMI. Similar to EKG from March, similar T wave abnormalities  RADIOLOGY CXR interpreted by me without evidence of acute cardiopulmonary pathology.  Official radiology report(s): DG Chest 2 View  Result Date: 11/26/2021 CLINICAL DATA:  Chest pain EXAM: CHEST - 2 VIEW COMPARISON:  06/21/2021 FINDINGS: The heart size and mediastinal contours are within normal limits. Both lungs are clear. The visualized skeletal structures are unremarkable. IMPRESSION: Normal study. Electronically Signed  By: Charlett Nose M.D.   On: 11/26/2021 17:09    PROCEDURES and INTERVENTIONS:  .1-3 Lead EKG Interpretation  Performed by: Delton Prairie, MD Authorized by: Delton Prairie, MD     Interpretation: normal     ECG rate:  78   ECG rate assessment: normal     Rhythm: sinus rhythm     Ectopy: none     Conduction: normal     Medications  potassium chloride SA (KLOR-CON M) CR tablet 40 mEq (40 mEq Oral Given 11/26/21 2211)     IMPRESSION / MDM / ASSESSMENT AND PLAN / ED COURSE  I reviewed the triage vital signs and the nursing notes.  Differential diagnosis includes, but is not limited to, anxiety, dehydration, sepsis, PE, cardiac dysrhythmia, TIA  {Patient presents with symptoms of an acute illness or injury that is potentially life-threatening.  31 year old female presents to the ED with  subacute positional presyncopal dizziness without evidence of acute pathology and suitable for outpatient management.  She is noted to be tachycardic in triage and this self resolves.  EKG with sinus rhythm without dysrhythmia.  Blood work with mild hypokalemia that is repleted orally, normal CBC, 2 negative troponins, clear CXR.  I have long discussion with her about possibly going down the pathway of the D-dimer and possibly CTA chest to evaluate for PE, but she declines this indicate that she could follow-up with her PCP or just return if her symptoms worsen.  She reports she has no chest discomfort now, she has no persistent tachycardia, and is not dizzy with position changes while I am examining her.  We discussed the risks of undiagnosed pathology and she is also requesting discharge.  We discussed close return precautions.     FINAL CLINICAL IMPRESSION(S) / ED DIAGNOSES   Final diagnoses:  Dizziness  Orthostasis     Rx / DC Orders   ED Discharge Orders     None        Note:  This document was prepared using Dragon voice recognition software and may include unintentional dictation errors.   Delton Prairie, MD 11/26/21 2329

## 2021-12-04 ENCOUNTER — Encounter: Payer: Self-pay | Admitting: Licensed Practical Nurse

## 2021-12-04 ENCOUNTER — Ambulatory Visit (INDEPENDENT_AMBULATORY_CARE_PROVIDER_SITE_OTHER): Payer: Medicaid Other | Admitting: Licensed Practical Nurse

## 2021-12-04 ENCOUNTER — Other Ambulatory Visit (HOSPITAL_COMMUNITY)
Admission: RE | Admit: 2021-12-04 | Discharge: 2021-12-04 | Disposition: A | Discharge status: Home / Self Care | Payer: Medicaid Other | Source: Ambulatory Visit | Attending: Licensed Practical Nurse | Admitting: Licensed Practical Nurse

## 2021-12-04 VITALS — BP 164/90 | Ht 67.0 in | Wt 284.0 lb

## 2021-12-04 DIAGNOSIS — Z01419 Encounter for gynecological examination (general) (routine) without abnormal findings: Secondary | ICD-10-CM | POA: Insufficient documentation

## 2021-12-04 DIAGNOSIS — Z0000 Encounter for general adult medical examination without abnormal findings: Secondary | ICD-10-CM

## 2021-12-04 DIAGNOSIS — N898 Other specified noninflammatory disorders of vagina: Secondary | ICD-10-CM | POA: Insufficient documentation

## 2021-12-04 DIAGNOSIS — Z113 Encounter for screening for infections with a predominantly sexual mode of transmission: Secondary | ICD-10-CM

## 2021-12-04 NOTE — Progress Notes (Unsigned)
Gynecology Annual Exam   PCP: [facility] [place]  Chief Complaint:  Chief Complaint  Patient presents with  . Gynecologic Exam    No concerns    History of Present Illness: Patient is a 31 y.o. [id number] presents for annual exam. The patient has no complaints today.   LMP: Patient's last menstrual period was 11/06/2021 (approximate). Average Interval: {Desc; regular/irreg:14544}, {numbers 22-35:14824} days Duration of flow: {numbers; 0-10:33138} days Heavy Menses: {yes/no:63} Clots: {yes/no:63} Intermenstrual Bleeding: {yes/no:63} Postcoital Bleeding: {yes/no:63} Dysmenorrhea: {yes/no:63}  The patient {sys sexually active:13135} sexually active. She currently uses {method:5051} for contraception. She {has/denies:315300} dyspareunia.  The patient {DOES_DOES NF:2365131 perform self breast exams.  There {is/is no:19420} notable family history of breast or ovarian cancer in her family.  The patient wears seatbelts: {yes/no:63}.   The patient has regular exercise: {yes/no/not asked:9010}.    The patient {Blank single:19197::"reports","denies"} current symptoms of depression.    Review of Systems: ROS  Past Medical History:  Patient Active Problem List   Diagnosis Date Noted  . Iron deficiency anemia   . Elevated liver function tests   . Hypokalemia   . Acute gallstone pancreatitis 03/27/2021  . Calculus of gallbladder without cholecystitis without obstruction   . History of cesarean section 10/06/2020  . Encounter for sterilization   . [redacted] weeks gestation of pregnancy   . Poor fetal growth affecting management of mother in third trimester 09/08/2020  . Gestational hypertension 08/23/2020  . Pregnancy affected by fetal growth restriction 07/13/2020    - followed by MFM   . Decreased fetal movement affecting management of pregnancy in second trimester 07/03/2020  . Supervision of high-risk pregnancy, third trimester 03/16/2020     Nursing Staff  Provider  Office Location  [facility] Dating   LMP = 10w [place]  Language  English Anatomy US   with MFM - IUGR  Flu Vaccine   UTD (through employer) Genetic Screen  NIPS:   Negx3, XY  TDaP vaccine   08/10/2020 Hgb A1C or  GTT Early : 100 Third trimester : 79  Rhogam   n/a   LAB RESULTS   Feeding Plan  Blood Type O/Positive/-- (12/08 1432)   Contraception  BTL Antibody Negative (12/08 1432)  Circumcision  Rubella <0.90 (12/08 1432)  Pediatrician   RPR Non Reactive (12/08 1432)   Support Person  Charles HBsAg Negative (12/08 1432)   Prenatal Classes Discussed HIV Non Reactive (12/08 1432)  Covid vaccine  completed x2 Varicella  immune  BTL Consent  desires [x ] consents 06/23/2020 GBS  (For PCN allergy, check sensitivities)        VBAC Consent  n/a -repeat cesarean Pap  NIL 2021    Hgb Electro      CF  negative     SMA  at risk - genetic counseling referral placed       High Risk Pregnancy Diagnoses  Hx of ASD/PFO- small on bubble study Hx of cesarean delivery x4 Maternal obesity - pregravid BMI >40 cHTN - nifedipine 90 mg daily (increased from 30 to 90 by [facility] 08/17/2020) Hx of cardiomegaly  IUGR - noted at 19 weeks- following with MFM - see recs Tobacco use in pregnancy- quit 06/02/2020   . ASD (atrial septal defect) 03/16/2020  . Bacterial vaginosis 06/25/2019  . Dysplasia of cervix, low grade (CIN 1) 12/11/2018    Repeat Pap smear in 1 year   . Previous cesarean delivery, antepartum 10/09/2018  . Obesity, Class III, BMI  40-49.9 (morbid obesity) ([place]) 09/04/2018  . Essential hypertension 05/19/2018    [x]  Aspirin 81 mg daily after 12 weeks; discontinue after 36 weeks [x]  baseline labs with CBC, CMP, urine protein/creatinine ratio [ ]  no BP meds unless BPs become elevated - controlled with amlodipine 5 mg [ ]  ultrasound for growth at 28, 32, 36 weeks [x]  Baseline EKG - cards referral 12/8  Current antihypertensives:  amlodipine 5 mg Baseline and surveillance labs (pulled in from  [facility], refresh links as needed)  Lab Results  Component Value Date   PLT 365 09/27/2019   CREATININE 0.70 05/19/2019   AST 21 05/19/2019   ALT 26 05/19/2019   PROTCRRATIO 0.14 10/09/2018   Antenatal Testing CHTN - O10.919  Group I  BP < 140/90, no preeclampsia, AGA,  nml AFV, +/- meds    Group II BP > 140/90, on meds, no preeclampsia, AGA, nml AFV  20-28-34-38  20-24-28-32-35-38  32//2 x wk  28//BPP wkly then 32//2 x wk  40 no meds; 39 meds  PRN or 37  Pre-eclampsia  GHTN - O13.9/Preeclampsia without severe features  - O14.00   Preeclampsia with severe features - O14.10  Q 3-4wks  Q 2 wks  28//BPP wkly then 32//2 x wk  Inpatient  37  PRN or 34     . PFO (patent foramen ovale) 02/18/2015    Cardiology consult placed 03/16/20   . Obesity in pregnancy, antepartum 02/15/2015    BMI >=40 [x]  early 1h gtt - ordered  [x]  u/s for dating [ ]   [x]  nutritional goals [x ] folic acid 1mg  [x]  bASA (>12 weeks) [x]  anesthesia consult  [ ]  repeat anesthesia consult @ 34 wks   . Previous cesarean delivery, antepartum condition or complication [id number]    Three prior Cesarean sections   . History of cardiomegaly 10/28/2014    Past Surgical History:  Past Surgical History:  Procedure Laterality Date  . CESAREAN SECTION  10/28/2008  . CESAREAN SECTION N/A 02/22/2015   Procedure: CESAREAN SECTION;  Surgeon: Gae Dry, MD;  Location: [facility] ORS;  Service: Obstetrics;  Laterality: N/A;  . CESAREAN SECTION  04/13/2010  . CESAREAN SECTION N/A 10/09/2018   Procedure: CESAREAN SECTION;  Surgeon: Homero Fellers, MD;  Location: [facility] ORS;  Service: Obstetrics;  Laterality: N/A;  . CESAREAN SECTION WITH BILATERAL TUBAL LIGATION Bilateral 10/06/2020   Procedure: CESAREAN SECTION WITH BILATERAL TUBAL LIGATION;  Surgeon: Homero Fellers, MD;  Location: [facility] ORS;  Service: Obstetrics;  Laterality: Bilateral;  . CHOLECYSTECTOMY    . TUBAL LIGATION      Gynecologic  History:  Patient's last menstrual period was 11/06/2021 (approximate). Contraception: {method:5051} Last Pap: Results were: {Findings; lab pap smear results:16707::"NIL and HR HPV+","NIL and HR HPV negative"}   Obstetric History: [id number]  Family History:  Family History  Problem Relation Age of Onset  . Hypertension Mother   . Stroke Paternal Aunt   . Colon cancer Maternal Grandfather     Social History:  Social History   Socioeconomic History  . Marital status: Single    Spouse name: Not on file  . Number of children: 5  . Years of education: Not on file  . Highest education level: Not on file  Occupational History  . Occupation: CNA  Tobacco Use  . Smoking status: Every Day    Packs/day: 0.50    Years: 2.00    Total pack years: 1.00    Types: Cigarettes  . Smokeless tobacco: Never  Vaping  Use  . Vaping Use: Never used  Substance and Sexual Activity  . Alcohol use: Yes    Comment: occasionally  . Drug use: No  . Sexual activity: Yes    Birth control/protection: Surgical    Comment: Tubal Ligation  Other Topics Concern  . Not on file  Social History Narrative  . Not on file   Social Determinants of Health   Financial Resource Strain: Low Risk  (07/28/2018)   Overall Financial Resource Strain (CARDIA)   . Difficulty of Paying Living Expenses: Not hard at all  Food Insecurity: No Food Insecurity (07/28/2018)   Hunger Vital Sign   . Worried About Programme researcher, broadcasting/film/video in the Last Year: Never true   . Ran Out of Food in the Last Year: Never true  Transportation Needs: No Transportation Needs (07/28/2018)   PRAPARE - Transportation   . Lack of Transportation (Medical): No   . Lack of Transportation (Non-Medical): No  Physical Activity: Insufficiently Active (07/28/2018)   Exercise Vital Sign   . Days of Exercise per Week: 4 days   . Minutes of Exercise per Session: 30 min  Stress: No Stress Concern Present (07/28/2018)   [employer] of Occupational Health -  Occupational Stress Questionnaire   . Feeling of Stress : Not at all  Social Connections: Moderately Integrated (07/28/2018)   Social Connection and Isolation Panel [NHANES]   . Frequency of Communication with Friends and Family: Three times a week   . Frequency of Social Gatherings with Friends and Family: Twice a week   . Attends Religious Services: 1 to 4 times per year   . Active Member of Clubs or Organizations: No   . Attends Banker Meetings: Never   . Marital Status: Living with partner  Intimate Partner Violence: Not At Risk (07/28/2018)   Humiliation, Afraid, Rape, and Kick questionnaire   . Fear of Current or Ex-Partner: No   . Emotionally Abused: No   . Physically Abused: No   . Sexually Abused: No    Allergies:  No Known Allergies  Medications: Prior to Admission medications   Medication Sig Start Date End Date Taking? Authorizing Provider  amLODipine (NORVASC) 5 MG tablet Take 5 mg by mouth daily.   Yes [provider]    Physical Exam Vitals: Blood pressure (!) 164/90, height 5\' 7"  (1.702 m), weight 284 lb (128.8 kg), last menstrual period 11/06/2021, not currently breastfeeding.  General: NAD HEENT: normocephalic, anicteric Thyroid: no enlargement, no palpable nodules Pulmonary: No increased work of breathing, CTAB Cardiovascular: RRR, distal pulses 2+ Breast: Breast symmetrical, no tenderness, no palpable nodules or masses, no skin or nipple retraction present, no nipple discharge.  No axillary or supraclavicular lymphadenopathy. Abdomen: NABS, soft, non-tender, non-distended.  Umbilicus without lesions.  No hepatomegaly, splenomegaly or masses palpable. No evidence of hernia  Genitourinary:  External: Normal external female genitalia.  Normal urethral meatus, normal Bartholin's and Skene's glands.    Vagina: Normal vaginal mucosa, no evidence of prolapse.    Cervix: Grossly normal in appearance, no bleeding  Uterus: Non-enlarged, mobile,  normal contour.  No CMT  Adnexa: ovaries non-enlarged, no adnexal masses  Rectal: deferred  Lymphatic: no evidence of inguinal lymphadenopathy Extremities: no edema, erythema, or tenderness Neurologic: Grossly intact Psychiatric: mood appropriate, affect full  Female chaperone present for pelvic and breast  portions of the physical exam    Assessment: 31 y.o. 26 routine annual exam  Plan: Problem List Items Addressed This Visit  None Visit Diagnoses     Well woman exam    -  Primary   Relevant Orders   HEP, RPR, HIV Panel   Hepatitis C antibody   Cytology - PAP   Cervicovaginal ancillary only   Screening examination for venereal disease       Relevant Orders   HEP, RPR, HIV Panel   Hepatitis C antibody   Cytology - PAP   Vaginal discharge       Relevant Orders   Cervicovaginal ancillary only       2) STI screening  {Blank single:19197::"was","was not"}offered and {Blank single:19197::"accepted","declined","therefore not obtained"}  2)  ASCCP guidelines and rational discussed.  Patient opts for {Blank single:19197::"***","every 5 years","every 3 years","yearly","discontinue age >65","discontinue secondary to prior hysterectomy"} screening interval  3) Contraception - the patient is currently using  {method:5051}.  She is {Blank single:19197::"happy with her current form of contraception and plans to continue","interested in changing to ***","interested in starting Contraception: ***","not currently in need of contraception secondary to being sterile","attempting to conceive in the near future"}  4) Routine healthcare maintenance including cholesterol, diabetes screening discussed {Blank single:19197::"managed by PCP","Ordered today","To return fasting at a later date","Declines"}  5) No follow-ups on file.  Carie Caddy, CNM  [facility], [facility] 12/04/2021, 2:03 PM

## 2021-12-05 LAB — HEP, RPR, HIV PANEL
HIV Screen 4th Generation wRfx: NONREACTIVE
Hepatitis B Surface Ag: NEGATIVE
RPR Ser Ql: NONREACTIVE

## 2021-12-05 LAB — HEPATITIS C ANTIBODY: Hep C Virus Ab: NONREACTIVE

## 2021-12-07 ENCOUNTER — Other Ambulatory Visit: Payer: Self-pay | Admitting: Licensed Practical Nurse

## 2021-12-07 DIAGNOSIS — B9689 Other specified bacterial agents as the cause of diseases classified elsewhere: Secondary | ICD-10-CM

## 2021-12-07 LAB — CERVICOVAGINAL ANCILLARY ONLY
Bacterial Vaginitis (gardnerella): POSITIVE — AB
Candida Glabrata: NEGATIVE
Candida Vaginitis: NEGATIVE
Comment: NEGATIVE
Comment: NEGATIVE
Comment: NEGATIVE

## 2021-12-07 MED ORDER — METRONIDAZOLE 500 MG PO TABS: 500.0000 mg | ORAL_TABLET | Freq: Two times a day (BID) | ORAL | 0 refills | Status: DC

## 2021-12-07 NOTE — Progress Notes (Signed)
Pt called, your swab shows that you have BV, pt familiar with diagnosis.  Script for Flagyl sent to pharmacy on file, aware that she should not consume alcohol while taking it. Carie Caddy, CNM  [facility], [facility]  12/07/21  1:40 PM

## 2021-12-12 LAB — CYTOLOGY - PAP
Chlamydia: NEGATIVE
Comment: NEGATIVE
Comment: NEGATIVE
Comment: NEGATIVE
Comment: NORMAL
Diagnosis: NEGATIVE
High risk HPV: NEGATIVE
Neisseria Gonorrhea: NEGATIVE
Trichomonas: NEGATIVE

## 2021-12-13 ENCOUNTER — Encounter: Payer: Self-pay | Admitting: Licensed Practical Nurse

## 2022-02-01 ENCOUNTER — Telehealth: Payer: Self-pay

## 2022-02-01 ENCOUNTER — Encounter: Payer: Self-pay | Admitting: Licensed Practical Nurse

## 2022-02-01 NOTE — Telephone Encounter (Signed)
Patient had contacted triage line stating that she has concerns of heavy vaginal bleeding. Patient states that her cycle started on 01/26/22 and states that it is heavier than usual. Patient reports mild abdominal cramping but denies clots, back pain, nausea/ vomiting or fever. Patient states that she is soaking 4-5 pads in one hour. Per nurse protocol it states that patient should be seen same day if no availability instruct patient to go to ED. I advised patient to proceed to [facility] ED for evaluation and she understood and agreed. KW

## 2022-02-07 ENCOUNTER — Ambulatory Visit (INDEPENDENT_AMBULATORY_CARE_PROVIDER_SITE_OTHER): Payer: Medicaid Other | Admitting: Licensed Practical Nurse

## 2022-02-07 ENCOUNTER — Other Ambulatory Visit (HOSPITAL_COMMUNITY)
Admission: RE | Admit: 2022-02-07 | Discharge: 2022-02-07 | Disposition: A | Discharge status: Home / Self Care | Payer: Medicaid Other | Source: Ambulatory Visit | Attending: Licensed Practical Nurse | Admitting: Licensed Practical Nurse

## 2022-02-07 VITALS — BP 139/90 | HR 80 | Wt 282.0 lb

## 2022-02-07 DIAGNOSIS — N898 Other specified noninflammatory disorders of vagina: Secondary | ICD-10-CM | POA: Insufficient documentation

## 2022-02-07 DIAGNOSIS — B3731 Acute candidiasis of vulva and vagina: Secondary | ICD-10-CM

## 2022-02-07 DIAGNOSIS — N920 Excessive and frequent menstruation with regular cycle: Secondary | ICD-10-CM | POA: Diagnosis not present

## 2022-02-07 MED ORDER — FLUCONAZOLE 150 MG PO TABS: 150.0000 mg | ORAL_TABLET | ORAL | 0 refills | Status: AC

## 2022-02-07 NOTE — Progress Notes (Signed)
Patient ID: [name], female   DOB: 08-Apr-1991, 31 y.o.   MRN: [id number]  Reason for Consult: No chief complaint on file.   Referred by [facility]  Subjective:     HPI:  Ashley Howell is a 31 y.o. female  reports having a cycle in early October that lasted 3 days, which is normal for her, then she had another cycle that started on October 20 and lasted about 2 weeks, this cycle was heavy, she soaked 4-5 pads in an hour, she did call the triage line and was instructed to go to the ED, she never went because the bleeding slowed down.  Previously, she has always had monthly cycles that last 3 days and are "light". She has been her current weight for a while. She had a tubal ligation, low risk of possible pregnancy.   Also  She has had vaginal irritation for the last day, has not noticed any changes or odor to her discharge. She is sexually active with 1 female. No recent changes to products etc. She has applied Vaseline to the area, which helps a little.   Ashley Howell has not taken her morning dose of blood pressure medication  Gynecological History  Patient's last menstrual period was 01/26/2022 (exact date). Menarche: not asked  Menopause: unsure of when the women in her family go through menopause   She reports passage of large clots She reports sensations of gushing or flooding of blood. She reports accidents where she bleeds through her clothing. She reports that she changes a saturated pad or tampon more frequently than every hour.  She denies that pain from her periods limits her activities.  History of fibroids, polyps, or ovarian cysts? : No History of PCOS? No Hstory of Endometriosis? No History of abnormal pap smears? ASC-US HPV pos in 2020 Have you had any sexually transmitted infections in the past? Trich 2022, April 2023,     Last Pap: Results were: 11/2021 NIL and HR HPV negative    Obstetrical History [id number]   Past Medical History:   Diagnosis Date   Anxiety    ASD (atrial septal defect)    Benign essential hypertension, antepartum 05/19/2018   160/99 at anatomy scan 18 weeks   Pt sent to ER on 05/15/2018 resolved with rest   Elevated at u/s visit - labetalol 100 mg bid initiated   Hx of cardiomegaly    Hypertension    PFO (patent foramen ovale)    a. 12/2014 Echo: EF 55-60%, mildly dil LA. PFO w/o significant shunting (reviewed by Dr. Mariah Milling 07/2018).   Shortness of breath dyspnea    Spinal headache    Family History  Problem Relation Age of Onset   Hypertension Mother    Stroke Paternal Aunt    Colon cancer Maternal Grandfather    Past Surgical History:  Procedure Laterality Date   CESAREAN SECTION  10/28/2008   CESAREAN SECTION N/A 02/22/2015   Procedure: CESAREAN SECTION;  Surgeon: Nadara Mustard, MD;  Location: [facility] ORS;  Service: Obstetrics;  Laterality: N/A;   CESAREAN SECTION  04/13/2010   CESAREAN SECTION N/A 10/09/2018   Procedure: CESAREAN SECTION;  Surgeon: Natale Milch, MD;  Location: [facility] ORS;  Service: Obstetrics;  Laterality: N/A;   CESAREAN SECTION WITH BILATERAL TUBAL LIGATION Bilateral 10/06/2020   Procedure: CESAREAN SECTION WITH BILATERAL TUBAL LIGATION;  Surgeon: Natale Milch, MD;  Location: [facility] ORS;  Service: Obstetrics;  Laterality: Bilateral;   CHOLECYSTECTOMY  TUBAL LIGATION      Short Social History:  Social History   Tobacco Use   Smoking status: Every Day    Packs/day: 0.50    Years: 2.00    Total pack years: 1.00    Types: Cigarettes   Smokeless tobacco: Never  Substance Use Topics   Alcohol use: Yes    Comment: occasionally    No Known Allergies  Current Outpatient Medications  Medication Sig Dispense Refill   amLODipine (NORVASC) 5 MG tablet Take 5 mg by mouth daily.     fluconazole (DIFLUCAN) 150 MG tablet Take 1 tablet (150 mg total) by mouth every 3 (three) days for 2 doses. 2 tablet 0   metroNIDAZOLE (FLAGYL) 500 MG tablet Take 1 tablet  (500 mg total) by mouth 2 (two) times daily. 14 tablet 0   No current facility-administered medications for this visit.    Review of Systems  Constitutional:  Constitutional negative. GI: Gastrointestinal negative.  GU: Genitourinary negative. Musculoskeletal: Musculoskeletal negative.  Psychiatric: Psychiatric negative.   Vaginal irritation Heavy menstruation with last cycle      Objective:  Objective   Vitals:   02/07/22 0813  BP: (!) 139/90  Pulse: 80  Weight: 282 lb (127.9 kg)   Body mass index is 44.17 kg/m.  Physical Exam Constitutional:      Appearance: Normal appearance.  Genitourinary:    General: Normal vulva.     Comments: White thick discharge present at introitus Cervix pink, no lesions, watery yellow discharge present near cervix. Bimanual exam: uterus non gravid size, non tender no masses, adnexa non tender no masses.  Neurological:     Mental Status: She is alert.   CT of abd in 2022 showed normal pelvis, moderate free fluid present   Assessment/Plan:    1)Vaginal irritation: aptima swab sent, pt will be contacted with abnormal results  2)Vaginal yeast- Diflucan sent to pharmacy  3)Heavy Menstrual bleeding: This type of bleeding is unusual, I am not sure if this will happen again.  At this time we do not need to do anything, but we could do an [place]. Or we can wait and see what happens with your next cycle. Pt prefers to wait and see.  -If pt has another occurrence of heavy menstrual bleeding:  -will order pelvic US  -OK for pt to contact office for Provera to stop the heavy bleeding if it occurs.    Roberto Scales, [facility]  02/07/22  12:26 PM

## 2022-02-08 ENCOUNTER — Other Ambulatory Visit: Payer: Self-pay | Admitting: Licensed Practical Nurse

## 2022-02-08 ENCOUNTER — Encounter: Payer: Self-pay | Admitting: Licensed Practical Nurse

## 2022-02-08 DIAGNOSIS — A599 Trichomoniasis, unspecified: Secondary | ICD-10-CM

## 2022-02-08 DIAGNOSIS — B9689 Other specified bacterial agents as the cause of diseases classified elsewhere: Secondary | ICD-10-CM

## 2022-02-08 LAB — CERVICOVAGINAL ANCILLARY ONLY
Bacterial Vaginitis (gardnerella): POSITIVE — AB
Candida Glabrata: NEGATIVE
Candida Vaginitis: NEGATIVE
Chlamydia: NEGATIVE
Comment: NEGATIVE
Comment: NEGATIVE
Comment: NEGATIVE
Comment: NEGATIVE
Comment: NEGATIVE
Comment: NORMAL
Neisseria Gonorrhea: NEGATIVE
Trichomonas: POSITIVE — AB

## 2022-02-08 MED ORDER — METRONIDAZOLE 500 MG PO TABS: 500.0000 mg | ORAL_TABLET | Freq: Two times a day (BID) | ORAL | 0 refills | Status: DC

## 2022-02-08 NOTE — Progress Notes (Signed)
TC to [facility] Reviewed test results.  You have both trich and BC.  Leily has had these infections in the past.  Understands her partner needs to be treated, no IC until 7 days after they both complete treatment.  Script for flagyl 500mg  twice a day x 7 days sent to pharmacy (pt treated for trich in April, BV in [place] try 500mg  BID x 7 day, may need to consider alternative treatment) Encouraged consistent condom use  Roberto Scales, [facility]  02/08/22  4:50 PM

## 2022-02-21 ENCOUNTER — Encounter: Payer: Self-pay | Admitting: Licensed Practical Nurse

## 2022-02-21 ENCOUNTER — Other Ambulatory Visit: Payer: Self-pay | Admitting: Licensed Practical Nurse

## 2022-02-21 DIAGNOSIS — B3731 Acute candidiasis of vulva and vagina: Secondary | ICD-10-CM

## 2022-02-21 MED ORDER — FLUCONAZOLE 150 MG PO TABS: 150.0000 mg | ORAL_TABLET | ORAL | 0 refills | Status: AC

## 2022-02-21 NOTE — Progress Notes (Signed)
Pt recently treated with Flagyl for trich and BV, now with yeast symptoms Script sent for Diflucan Jannifer Hick   [facility]  02/21/22  8:03 PM

## 2022-05-14 ENCOUNTER — Encounter: Payer: Self-pay | Admitting: Emergency Medicine

## 2022-05-14 ENCOUNTER — Ambulatory Visit (INDEPENDENT_AMBULATORY_CARE_PROVIDER_SITE_OTHER)
Admit: 2022-05-14 | Discharge: 2022-05-14 | Disposition: A | Discharge status: Home / Self Care | Payer: Medicaid Other | Attending: Emergency Medicine | Admitting: Emergency Medicine

## 2022-05-14 ENCOUNTER — Ambulatory Visit
Admission: EM | Admit: 2022-05-14 | Discharge: 2022-05-14 | Disposition: A | Discharge status: Home / Self Care | Payer: Medicaid Other | Attending: Emergency Medicine | Admitting: Emergency Medicine

## 2022-05-14 DIAGNOSIS — S8011XA Contusion of right lower leg, initial encounter: Secondary | ICD-10-CM

## 2022-05-14 NOTE — Discharge Instructions (Signed)
Your x-ray does not show any signs of a blood clot.  I believe that you have bruised the soft tissue is in your lower leg and this will take time to heal.  Please continue to take over-the-counter ibuprofen and Aleve according to the package instructions as needed for pain.  You may apply ice to your leg for 20 minutes at a time 2-3 times a day and just make sure that you have a cloth between your skin and the ice that do not cause skin damage.  You may also purchase a copper fit compression sleeve from [employer] to wear on your lower leg to help give some compression and aid in pain relief and support.

## 2022-05-14 NOTE — ED Triage Notes (Signed)
Pt c/o right leg pain x 2 weeks. She hit her right shin against her bed. The pain radiates down to her ankle and she has some tightness in the calf area.

## 2022-05-14 NOTE — ED Provider Notes (Signed)
[facility]    CSN: [id number] Arrival date & time: 05/14/22  1242      History   Chief Complaint Chief Complaint  Patient presents with   Leg Pain    HPI Ashley Howell is a 32 y.o. female.   HPI  32 year old female here for evaluation of right lower extremity pain and swelling.  The patient reports that 2 weeks ago she kicked her bed with her shin and then the following week she developed pain, swelling, tightness in her calf, and pain with ambulation.  She states that she also has been experiencing numbness and tingling in her toes.  She states she has been keeping it elevated, taking over-the-counter pain relievers, and using IcyHot without any improvement.  She denies any redness but states that there is some bruising on the front part of her leg from where she hit her bed.  Patient is a smoker but denies any history of blood clots.  Past Medical History:  Diagnosis Date   Anxiety    ASD (atrial septal defect)    Benign essential hypertension, antepartum 05/19/2018   160/99 at anatomy scan 18 weeks   Pt sent to ER on 05/15/2018 resolved with rest   Elevated at u/s visit - labetalol 100 mg bid initiated   Hx of cardiomegaly    Hypertension    PFO (patent foramen ovale)    a. 12/2014 Echo: EF 55-60%, mildly dil LA. PFO w/o significant shunting (reviewed by Dr. Mariah Milling 07/2018).   Shortness of breath dyspnea    Spinal headache     Patient Active Problem List   Diagnosis Date Noted   Iron deficiency anemia    Elevated liver function tests    Hypokalemia    Acute gallstone pancreatitis 03/27/2021   Calculus of gallbladder without cholecystitis without obstruction    History of cesarean section 10/06/2020   Encounter for sterilization    Poor fetal growth affecting management of mother in third trimester 09/08/2020   Gestational hypertension 08/23/2020   ASD (atrial septal defect) 03/16/2020   Bacterial vaginosis 06/25/2019   Dysplasia of cervix, low  grade (CIN 1) 12/11/2018   Obesity, Class III, BMI 40-49.9 (morbid obesity) (HCC) 09/04/2018   Essential hypertension 05/19/2018   PFO (patent foramen ovale) 02/18/2015   Obesity in pregnancy, antepartum 02/15/2015   History of cardiomegaly 10/28/2014    Past Surgical History:  Procedure Laterality Date   CESAREAN SECTION  10/28/2008   CESAREAN SECTION N/A 02/22/2015   Procedure: CESAREAN SECTION;  Surgeon: Nadara Mustard, MD;  Location: [facility] ORS;  Service: Obstetrics;  Laterality: N/A;   CESAREAN SECTION  04/13/2010   CESAREAN SECTION N/A 10/09/2018   Procedure: CESAREAN SECTION;  Surgeon: Natale Milch, MD;  Location: [facility] OR[facility];  Service: Obstetrics;  Laterality: N/A;   CESAREAN SECTION WITH BILATERAL TUBAL LIGATION Bilateral 10/06/2020   Procedure: CESAREAN SECTION WITH BILATERAL TUBAL LIGATION;  Surgeon: Natale Milch, MD;  Location: [facility] OR[facility];  Service: Obstetrics;  Laterality: Bilateral;   CHOLECYSTECTOMY     TUBAL LIGATION      OB History     Gravida  7   Para  5   Term  5   Preterm      AB  2   Living  5      SAB  1   IAB  1   Ectopic      Multiple  0   Live Births  5  Home Medications    Prior to Admission medications   Medication Sig Start Date End Date Taking? Authorizing Provider  amLODipine (NORVASC) 5 MG tablet Take 5 mg by mouth daily.   Yes [provider]  metroNIDAZOLE (FLAGYL) 500 MG tablet Take 1 tablet (500 mg total) by mouth 2 (two) times daily. 02/08/22   Dominic, Nunzio Cobbs, CNM    Family History Family History  Problem Relation Age of Onset   Hypertension Mother    Stroke Paternal Aunt    Colon cancer Maternal Grandfather     Social History Social History   Tobacco Use   Smoking status: Every Day    Packs/day: 0.50    Years: 2.00    Total pack years: 1.00    Types: Cigarettes   Smokeless tobacco: Never  Vaping Use   Vaping Use: Never used  Substance Use Topics   Alcohol use:  Yes    Comment: occasionally   Drug use: No     Allergies   Patient has no known allergies.   Review of Systems Review of Systems  Musculoskeletal:  Positive for myalgias.       Pain and tightness in right calf.  Skin:  Positive for color change.  Neurological:  Positive for numbness. Negative for weakness.     Physical Exam Triage Vital Signs ED Triage Vitals [05/14/22 1338]  Enc Vitals Group     BP      Pulse      Resp      Temp      Temp src      SpO2      Weight      Height      Head Circumference      Peak Flow      Pain Score 9     Pain Loc      Pain Edu?      Excl. in [place]?    No data found.  Updated Vital Signs BP 139/89 (BP Location: Right Arm)   Pulse 100   Temp 98.6 F (37 C) (Oral)   Resp 16   LMP 04/10/2022 (Approximate)   SpO2 97%   Visual Acuity Right Eye Distance:   Left Eye Distance:   Bilateral Distance:    Right Eye Near:   Left Eye Near:    Bilateral Near:     Physical Exam Vitals and nursing note reviewed.  Constitutional:      Appearance: She is obese. She is not ill-appearing.  Musculoskeletal:        General: Swelling, tenderness and signs of injury present. No deformity. Normal range of motion.  Skin:    General: Skin is warm and dry.     Capillary Refill: Capillary refill takes less than 2 seconds.     Findings: Bruising present. No erythema.  Neurological:     General: No focal deficit present.     Mental Status: She is alert and oriented to person, place, and time.  Psychiatric:        Mood and Affect: Mood normal.        Behavior: Behavior normal.        Thought Content: Thought content normal.        Judgment: Judgment normal.      UC Treatments / Results  Labs (all labs ordered are listed, but only abnormal results are displayed) Labs Reviewed - No data to display  EKG   Radiology US Venous Img Lower Unilateral Right  Result  Date: 05/14/2022 CLINICAL DATA:  RIGHT calf pain for a week EXAM: RIGHT   LOWER EXTREMITY VENOUS DOPPLER ULTRASOUND TECHNIQUE: Gray-scale sonography with compression, as well as color and duplex ultrasound, were performed to evaluate the deep venous system(s) from the level of the common femoral vein through the popliteal and proximal calf veins. COMPARISON:  None Available. FINDINGS: VENOUS Normal compressibility of the common femoral, superficial femoral, and popliteal veins, as well as the visualized calf veins. Visualized portions of profunda femoral vein and great saphenous vein unremarkable. No filling defects to suggest DVT on grayscale or color Doppler imaging. Doppler waveforms show normal direction of venous flow, normal respiratory plasticity and response to augmentation. Limited views of the contralateral common femoral vein are unremarkable. OTHER None. Limitations: none IMPRESSION: Negative.  No DVT of the RIGHT lower extremity. Electronically Signed   By: Franki Cabot M.D.   On: 05/14/2022 15:17    Procedures Procedures (including critical care time)  Medications Ordered in UC Medications - No data to display  Initial Impression / Assessment and Plan / UC Course  I have reviewed the triage vital signs and the nursing notes.  Pertinent labs & imaging results that were available during my care of the patient were reviewed by me and considered in my medical decision making (see chart for details).   Patient is a nontoxic-appearing 32 year old female here for evaluation of right lower extremity pain and swelling x 2 weeks as outlined in HPI above.    Patient has a visible area of swelling over her midshaft tibia with some hyperpigmentation at the area of impact.  This area is not hot or erythematous but it is tender to touch.  The patient also has tenderness in her calf and a positive Bevelyn Buckles' sign on the right.  Her right calf measures 50 cm in her left calf measures 48 cm in circumference.  DP and PT pulses in the right lower extremity are 2+.  Cap refills  less than 2 seconds.  The patient does not have a history of blood clots but she is obese, has a recent injury, has been keeping her leg immobile, and is a cigarette smoker, all of which put her at risk for a blood clot.  Given the difference in calf circumference as well as the positive Homans' sign and calf tenderness I will order a right lower extremity ultrasound for the presence of a clot.  Given the soft tissue swelling that is present this could also be a deep hematoma but there is no induration or fluctuance appreciated.  Right lower extremity ultrasound states there is no DVT of the right lower extremity.  I will discharge the patient home with a diagnosis of contusion of right lower leg and have her continue using over-the-counter NSAIDs according to the package instruction.  She can also purchase a compression sleeve from [employer] to help with pain relief.   Final Clinical Impressions(s) / UC Diagnoses   Final diagnoses:  Contusion of right lower leg, initial encounter     Discharge Instructions      Your x-ray does not show any signs of a blood clot.  I believe that you have bruised the soft tissue is in your lower leg and this will take time to heal.  Please continue to take over-the-counter ibuprofen and Aleve according to the package instructions as needed for pain.  You may apply ice to your leg for 20 minutes at a time 2-3 times a day and just make sure  that you have a cloth between your skin and the ice that do not cause skin damage.  You may also purchase a copper fit compression sleeve from [employer] to wear on your lower leg to help give some compression and aid in pain relief and support.       ED Prescriptions   None    PDMP not reviewed this encounter.   Margarette Canada, NP 05/14/22 1525

## 2022-06-27 IMAGING — US US MFM FETAL BPP W/O NON-STRESS
1 series · 13 of 25 positions shown · non-contrast
Comparison: none

[Series 1: us mfm fetal bpp w/o non-stress · 25 acquisitions, 13 frames shown]
[im 1/25]
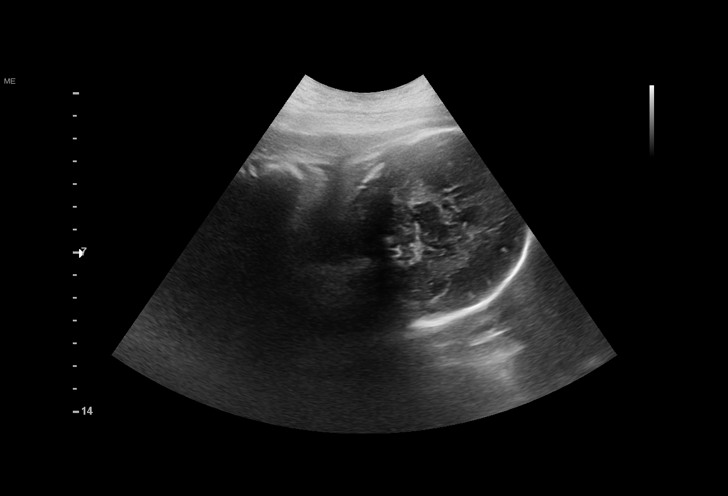
[im 3/25]
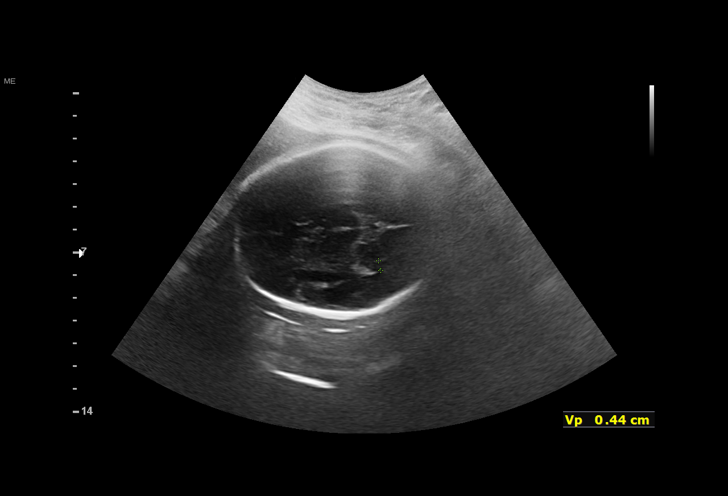
[im 5/25]
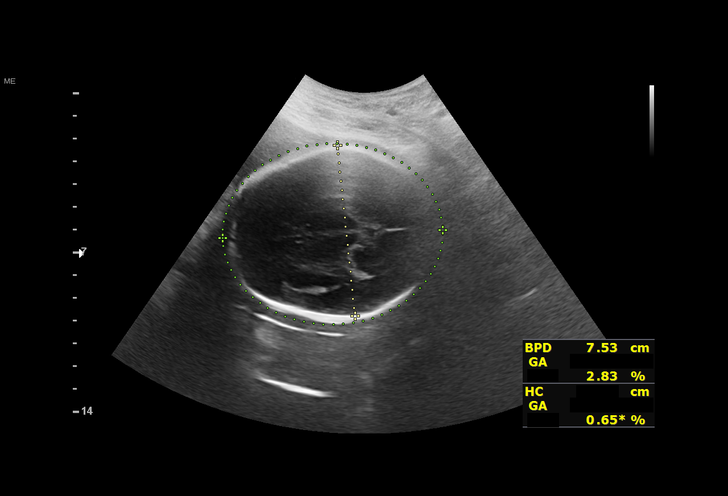
[im 7/25]
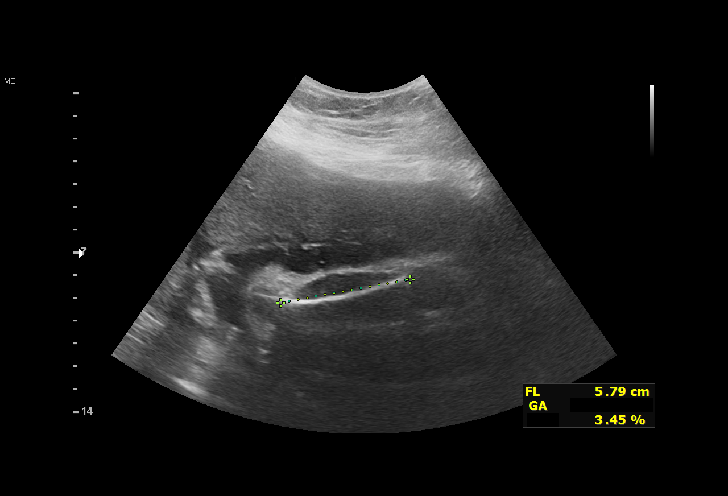
[im 9/25]
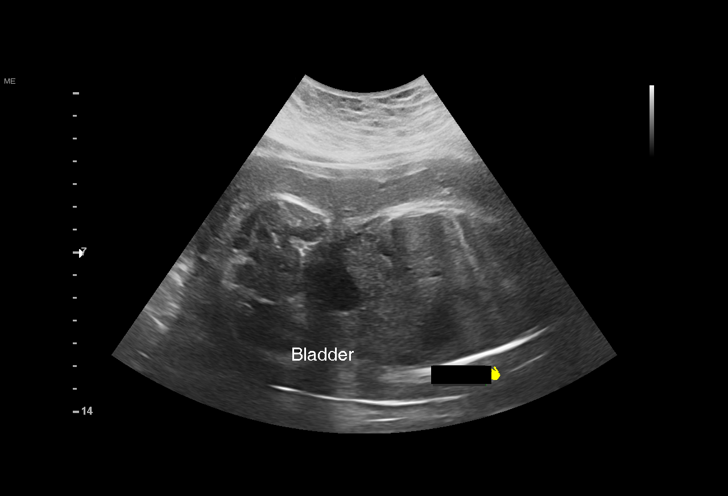
[im 11/25]
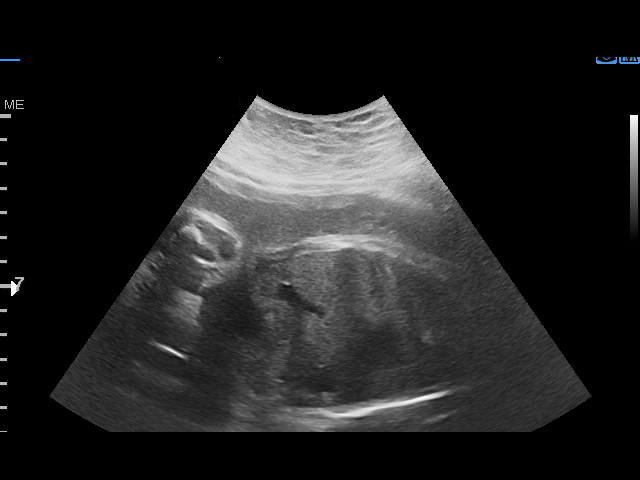
[im 13/25]
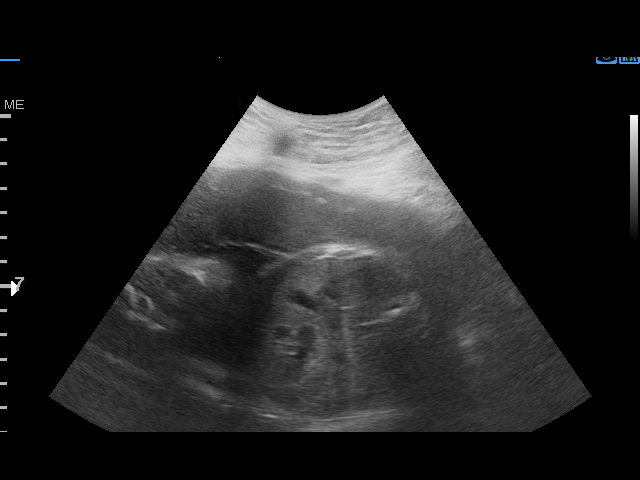
[im 15/25]
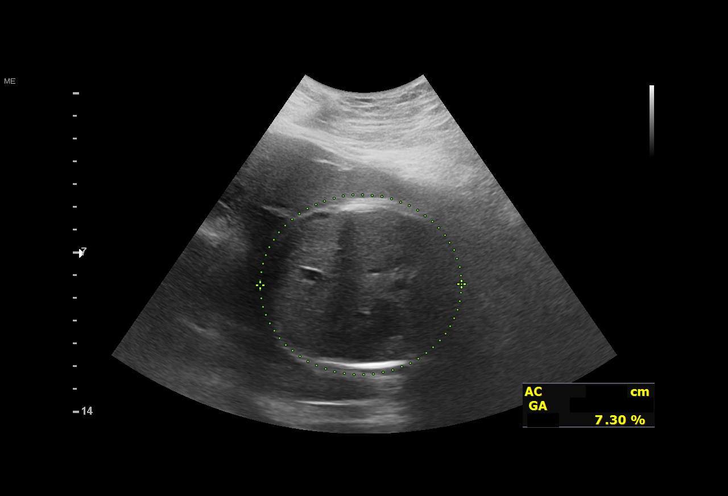
[im 17/25]
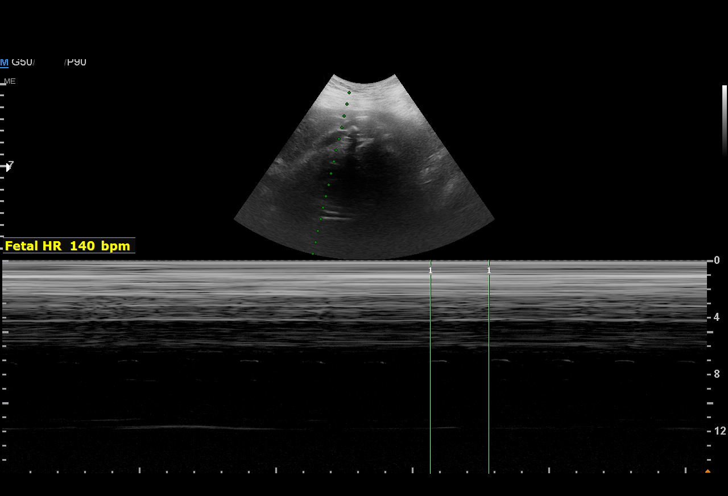
[im 19/25]
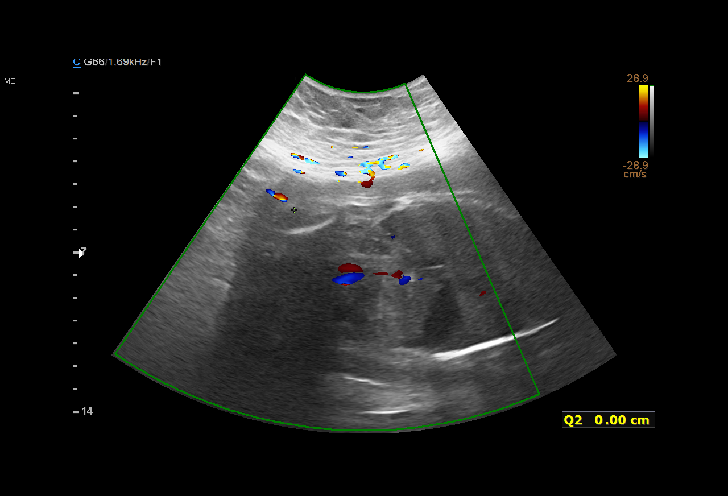
[im 21/25]
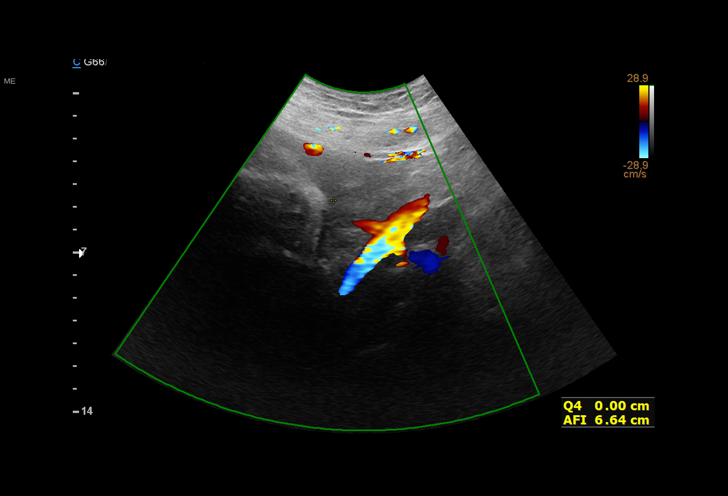
[im 23/25]
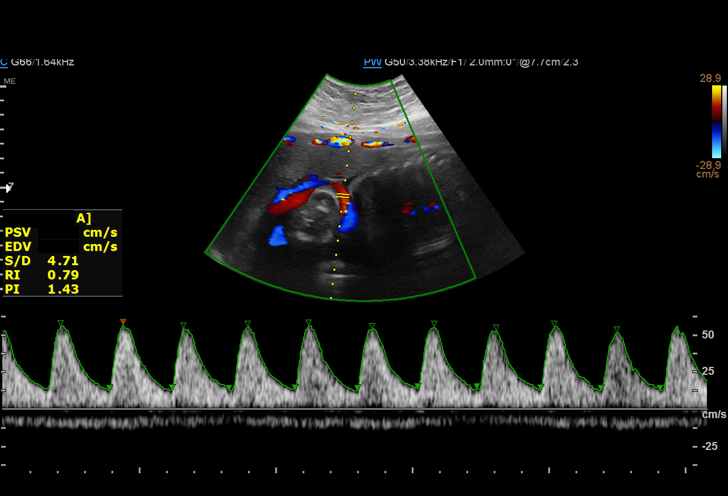
[im 25/25]
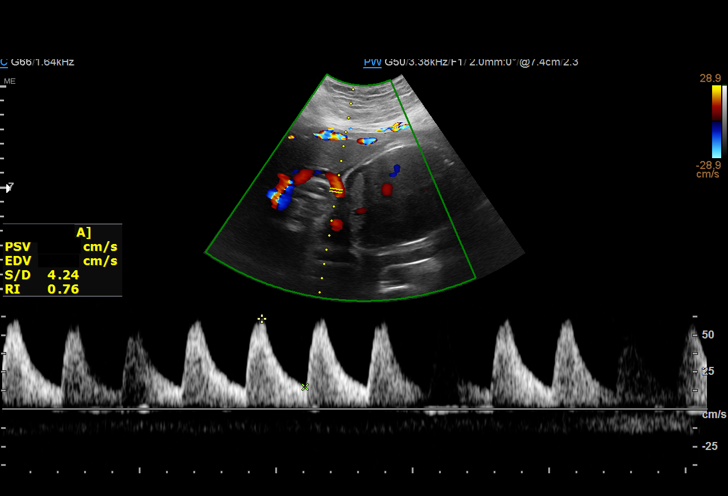

[13 of 25 positions shown; findings below may reference images not displayed]

1  US MFM UA CORD DOPPLER                76820.02    PAULUS N CEEJAY

Indications

 32 weeks gestation of pregnancy
 Encounter for fetal growth retardation
 Oligohydramnios / Decreased amniotic fluid
 volume
 Hypertension - Chronic/Pre-existing
 Obesity complicating pregnancy
 Tobacco use complicating pregnancy
Fetal Evaluation

 Num Of Fetuses:         1
 Fetal Heart Rate(bpm):  140
 Cardiac Activity:       Observed
 Presentation:           Cephalic
 Placenta:               Anterior
 P. Cord Insertion:      Previously Visualized

 AFI Sum(cm)     %Tile       Largest Pocket(cm)
 6.7             < 3

 RUQ(cm)       RLQ(cm)       LUQ(cm)        LLQ(cm)
 3.3           0             0
Biophysical Evaluation

 Amniotic F.V:   Within normal limits       F. Tone:        Observed
 F. Movement:    Observed                   Score:          [DATE]
 F. Breathing:   Observed
Biometry

 BPD:      74.9  mm     G. Age:  30w 0d          2  %    CI:        72.63   %    70 - 86
                                                         FL/HC:      20.9   %    19.1 -
 HC:      279.5  mm     G. Age:  30w 4d        < 1  %    HC/AC:      1.07        0.96 -
 AC:      260.6  mm     G. Age:  30w 1d        4.9  %    FL/BPD:     78.0   %    71 - 87
 FL:       58.4  mm     G. Age:  30w 4d          5  %    FL/AC:      22.4   %    20 - 24
 LV:        4.4  mm

 Est. FW:    0332  gm      3 lb 7 oz    3.6  %
Gestational Age

 LMP:           32w 2d        Date:  01/19/20                 EDD:   10/25/20
 U/S Today:     30w 2d                                        EDD:   11/08/20
 Best:          32w 2d     Det. By:  LMP  (01/19/20)          EDD:   10/25/20
Anatomy

 Cranium:               Previously seen        LVOT:                   Previously seen
 Cavum:                 Appears normal         Aortic Arch:            Previously seen
 Ventricles:            Appears normal         Ductal Arch:            Previously seen
 Choroid Plexus:        Previously seen        Diaphragm:              Previously seen
 Cerebellum:            Previously seen        Stomach:                Appears normal, left
                                                                       sided
 Posterior Fossa:       Previously seen        Abdomen:                Previously seen
 Nuchal Fold:           Not applicable (>20    Abdominal Wall:         Previously seen
                        wks GA)
 Face:                  Orbits and profile     Cord Vessels:           Previously seen
                        previously seen
 Lips:                  Previously seen        Kidneys:                Previously seen
 Palate:                Previously seen        Bladder:                Appears normal
 Thoracic:              Previously seen        Spine:                  Previously seen
 Heart:                 Previously seen        Upper Extremities:      Previously seen
 RVOT:                  Previously seen
Doppler - Fetal Vessels

 Umbilical Artery
  S/D     %tile      RI    %tile
  4.19   > 97.5    0.76       96

Impression

 Follow up growth due to prior FGR ( EFW 4th% and AC 8th%)
 Normal interval growth with measurements consistent with
 fetal growth restriction (EFW 3.6% and AC 4.9%).
 Good fetal movement and the amniotic fluid was normal but
 subjectively low.
 Biophysical profile [DATE]
 UA Dopplers are elevated today without evidence of AEDF or
 REDF.

 Ms. Laliberte has known chronic hypertension with normal
 blood pressures today of 135/ 85 mmHg. She is taking
 Procardia 90 mg daily.

 She had a reactive NST yesterday with her providers office.
 Continue 2x weekly testing with NST at providers and BPP at
 our offices.
Recommendations

 Contineu twice weekly testing BPP/UA Dopplers and NST on
 separate days.
 Growth is scheduled in 3 weeks.
 Consider delivery at 37 weeks given abnormal UA Dopplers.

## 2022-07-23 ENCOUNTER — Ambulatory Visit (INDEPENDENT_AMBULATORY_CARE_PROVIDER_SITE_OTHER): Payer: Medicaid Other

## 2022-07-23 ENCOUNTER — Other Ambulatory Visit (HOSPITAL_COMMUNITY)
Admission: RE | Admit: 2022-07-23 | Discharge: 2022-07-23 | Disposition: A | Discharge status: Home / Self Care | Payer: Medicaid Other | Source: Ambulatory Visit | Attending: Licensed Practical Nurse | Admitting: Licensed Practical Nurse

## 2022-07-23 VITALS — BP 127/73 | HR 82 | Wt 297.5 lb

## 2022-07-23 DIAGNOSIS — R399 Unspecified symptoms and signs involving the genitourinary system: Secondary | ICD-10-CM | POA: Diagnosis not present

## 2022-07-23 DIAGNOSIS — N898 Other specified noninflammatory disorders of vagina: Secondary | ICD-10-CM | POA: Insufficient documentation

## 2022-07-23 LAB — POCT URINALYSIS DIPSTICK
Bilirubin, UA: NEGATIVE
Blood, UA: NEGATIVE
Glucose, UA: NEGATIVE
Ketones, UA: NEGATIVE
Leukocytes, UA: NEGATIVE
Nitrite, UA: NEGATIVE
Protein, UA: NEGATIVE
Spec Grav, UA: 1.02 (ref 1.010–1.025)
Urobilinogen, UA: 0.2 E.U./dL
pH, UA: 5 (ref 5.0–8.0)

## 2022-07-23 NOTE — Progress Notes (Signed)
    NURSE VISIT NOTE  Subjective:    Patient ID: Ashley Howell, female    DOB: [birth year], 32 y.o.   MRN: [id number]  HPI  Patient is a 32 y.o. [id number] female who presents for clear vaginal discharge with odor for 1 month(s). Denies abnormal vaginal bleeding or significant pelvic pain or fever. admits to abdominal pain and back pains.Patient has history of known exposure to STD.   Objective:    BP 127/73   Pulse 82   Wt 297 lb 8 oz (134.9 kg)   BMI 46.60 kg/m      Assessment:     Plan:   GC and chlamydia DNA  probe sent to lab. Treatment: Will call with results from swab and urine culture. Can start Monistat 7 day treatment until results are in.  ROV prn if symptoms persist or worsen.   Burtis Junes, CMA

## 2022-07-25 ENCOUNTER — Other Ambulatory Visit: Payer: Self-pay

## 2022-07-25 DIAGNOSIS — B9689 Other specified bacterial agents as the cause of diseases classified elsewhere: Secondary | ICD-10-CM

## 2022-07-25 LAB — CERVICOVAGINAL ANCILLARY ONLY
Bacterial Vaginitis (gardnerella): POSITIVE — AB
Candida Glabrata: NEGATIVE
Candida Vaginitis: NEGATIVE
Chlamydia: NEGATIVE
Comment: NEGATIVE
Comment: NEGATIVE
Comment: NEGATIVE
Comment: NEGATIVE
Comment: NEGATIVE
Comment: NORMAL
Neisseria Gonorrhea: NEGATIVE
Trichomonas: NEGATIVE

## 2022-07-25 LAB — URINE CULTURE

## 2022-07-25 MED ORDER — METRONIDAZOLE 500 MG PO TABS
500.0000 mg | ORAL_TABLET | Freq: Two times a day (BID) | ORAL | 0 refills | Status: DC
Start: 2022-07-25 — End: 2022-10-10

## 2022-07-26 ENCOUNTER — Other Ambulatory Visit: Payer: Self-pay | Admitting: Obstetrics

## 2022-07-26 NOTE — Progress Notes (Signed)
Recent aptima swab sent due to vaginal discharge. + BV noted. A Rx for Metronidazole has been sent in for her.  Mirna Mires, CNM  07/26/2022 8:26 AM

## 2022-08-06 ENCOUNTER — Ambulatory Visit
Admission: EM | Admit: 2022-08-06 | Discharge: 2022-08-06 | Disposition: A | Discharge status: Home / Self Care | Payer: Medicaid Other | Attending: Emergency Medicine | Admitting: Emergency Medicine

## 2022-08-06 ENCOUNTER — Encounter: Payer: Self-pay | Admitting: Emergency Medicine

## 2022-08-06 DIAGNOSIS — G44209 Tension-type headache, unspecified, not intractable: Secondary | ICD-10-CM

## 2022-08-06 DIAGNOSIS — R002 Palpitations: Secondary | ICD-10-CM

## 2022-08-06 MED ORDER — BUTALBITAL-APAP-CAFFEINE 50-325-40 MG PO TABS: 2.0000 | ORAL_TABLET | Freq: Three times a day (TID) | ORAL | 0 refills | Status: AC | PRN

## 2022-08-06 NOTE — ED Provider Notes (Signed)
[facility]    CSN: [id number] Arrival date & time: 08/06/22  1544      History   Chief Complaint Chief Complaint  Patient presents with   Headache   Palpitations    HPI Ashley Howell is a 32 y.o. female.   HPI  32 year old female with a history of of PFO, gestational hypertension, cardiomegaly, and essential hypertension presents for evaluation of 1 week worth of headache and palpitations.  She reports that both of these symptoms come and go.  Her headache is located in the frontal region and it improves when she takes 400 mg of ibuprofen.  She has been taking it every 4-6 hours.  The palpitations only occur when she is upset and yelling at her children but otherwise are not present when she is at rest.  There is no associated change in vision, nausea or vomiting, chest pain, or syncope.  She states she had 1 episode of dizziness yesterday that resolved when she laid down and has not had any further.  She denies any history of migraines.  Past Medical History:  Diagnosis Date   Anxiety    ASD (atrial septal defect)    Benign essential hypertension, antepartum 05/19/2018   160/99 at anatomy scan 18 weeks   Pt sent to ER on 05/15/2018 resolved with rest   Elevated at u/s visit - labetalol 100 mg bid initiated   Hx of cardiomegaly    Hypertension    PFO (patent foramen ovale)    a. 12/2014 Echo: EF 55-60%, mildly dil LA. PFO w/o significant shunting (reviewed by Dr. Mariah Milling 07/2018).   Shortness of breath dyspnea    Spinal headache     Patient Active Problem List   Diagnosis Date Noted   Iron deficiency anemia    Elevated liver function tests    Hypokalemia    Acute gallstone pancreatitis 03/27/2021   Calculus of gallbladder without cholecystitis without obstruction    History of cesarean section 10/06/2020   Encounter for sterilization    Poor fetal growth affecting management of mother in third trimester 09/08/2020   Gestational hypertension 08/23/2020    ASD (atrial septal defect) 03/16/2020   Bacterial vaginosis 06/25/2019   Dysplasia of cervix, low grade (CIN 1) 12/11/2018   Obesity, Class III, BMI 40-49.9 (morbid obesity) (HCC) 09/04/2018   Essential hypertension 05/19/2018   PFO (patent foramen ovale) 02/18/2015   Obesity in pregnancy, antepartum 02/15/2015   History of cardiomegaly 10/28/2014    Past Surgical History:  Procedure Laterality Date   CESAREAN SECTION  10/28/2008   CESAREAN SECTION N/A 02/22/2015   Procedure: CESAREAN SECTION;  Surgeon: Nadara Mustard, MD;  Location: [facility];  Service: Obstetrics;  Laterality: N/A;   CESAREAN SECTION  04/13/2010   CESAREAN SECTION N/A 10/09/2018   Procedure: CESAREAN SECTION;  Surgeon: Natale Milch, MD;  Location: [facility];  Service: Obstetrics;  Laterality: N/A;   CESAREAN SECTION WITH BILATERAL TUBAL LIGATION Bilateral 10/06/2020   Procedure: CESAREAN SECTION WITH BILATERAL TUBAL LIGATION;  Surgeon: Natale Milch, MD;  Location: [facility];  Service: Obstetrics;  Laterality: Bilateral;   CHOLECYSTECTOMY     TUBAL LIGATION      OB History     Gravida  7   Para  5   Term  5   Preterm      AB  2   Living  5      SAB  1   IAB  1   Ectopic  Multiple  0   Live Births  5            Home Medications    Prior to Admission medications   Medication Sig Start Date End Date Taking? Authorizing Provider  amLODipine (NORVASC) 5 MG tablet Take 5 mg by mouth daily.   Yes [provider]  butalbital-acetaminophen-caffeine (FIORICET) 50-325-40 MG tablet Take 2 tablets by mouth every 8 (eight) hours as needed for headache. 08/06/22 08/06/23 Yes Becky Augusta, NP  metroNIDAZOLE (FLAGYL) 500 MG tablet Take 1 tablet (500 mg total) by mouth 2 (two) times daily. 02/08/22   Dominic, [name] Heys, CNM  metroNIDAZOLE (FLAGYL) 500 MG tablet Take 1 tablet (500 mg total) by mouth 2 (two) times daily. 07/25/22   Dominic, [name] Heys, CNM    Family  History Family History  Problem Relation Age of Onset   Hypertension Mother    Stroke Paternal Aunt    Colon cancer Maternal Grandfather     Social History Social History   Tobacco Use   Smoking status: Every Day    Packs/day: 0.50    Years: 2.00    Additional pack years: 0.00    Total pack years: 1.00    Types: Cigarettes   Smokeless tobacco: Never  Vaping Use   Vaping Use: Every day  Substance Use Topics   Alcohol use: Yes    Comment: occasionally   Drug use: No     Allergies   Patient has no known allergies.   Review of Systems Review of Systems  HENT:  Negative for congestion, ear pain, rhinorrhea and sore throat.   Respiratory:  Negative for cough and shortness of breath.   Cardiovascular:  Positive for palpitations. Negative for chest pain.  Neurological:  Positive for dizziness and headaches.     Physical Exam Triage Vital Signs ED Triage Vitals  Enc Vitals Group     BP 08/06/22 1656 (!) 145/99     Pulse Rate 08/06/22 1656 81     Resp 08/06/22 1656 16     Temp 08/06/22 1656 99.1 F (37.3 C)     Temp Source 08/06/22 1656 Oral     SpO2 08/06/22 1656 100 %     Weight --      Height --      Head Circumference --      Peak Flow --      Pain Score 08/06/22 1652 9     Pain Loc --      Pain Edu? --      Excl. in GC? --    No data found.  Updated Vital Signs BP (!) 145/99 (BP Location: Right Arm)   Pulse 81   Temp 99.1 F (37.3 C) (Oral)   Resp 16   LMP 08/06/2022   SpO2 100%   Visual Acuity Right Eye Distance:   Left Eye Distance:   Bilateral Distance:    Right Eye Near:   Left Eye Near:    Bilateral Near:     Physical Exam Vitals and nursing note reviewed.  Constitutional:      Appearance: Normal appearance. She is not ill-appearing.  HENT:     Head: Normocephalic and atraumatic.     Mouth/Throat:     Mouth: Mucous membranes are moist.     Pharynx: Oropharynx is clear. No oropharyngeal exudate or posterior oropharyngeal  erythema.  Eyes:     General: No scleral icterus.    Extraocular Movements: Extraocular movements intact.  Conjunctiva/sclera: Conjunctivae normal.     Pupils: Pupils are equal, round, and reactive to light.  Cardiovascular:     Rate and Rhythm: Normal rate and regular rhythm.     Pulses: Normal pulses.     Heart sounds: Normal heart sounds. No murmur heard.    No friction rub. No gallop.  Pulmonary:     Effort: Pulmonary effort is normal.     Breath sounds: Normal breath sounds. No wheezing, rhonchi or rales.  Musculoskeletal:     Cervical back: Normal range of motion and neck supple.  Lymphadenopathy:     Cervical: No cervical adenopathy.  Skin:    General: Skin is warm and dry.     Capillary Refill: Capillary refill takes less than 2 seconds.     Findings: No erythema or rash.  Neurological:     General: No focal deficit present.     Mental Status: She is alert and oriented to person, place, and time.     Cranial Nerves: No cranial nerve deficit.     Motor: No weakness.     Coordination: Coordination normal.     Deep Tendon Reflexes: Reflexes normal.  Psychiatric:        Mood and Affect: Mood normal.        Behavior: Behavior normal.        Thought Content: Thought content normal.        Judgment: Judgment normal.      UC Treatments / Results  Labs (all labs ordered are listed, but only abnormal results are displayed) Labs Reviewed - No data to display  EKG Normal sinus rhythm with sinus arrhythmia ventricular rate of 74 bpm PR interval 152 ms QRS duration 82 ms QT/QTc 402/446 ms No ST or T wave abnormalities.   Radiology No results found.  Procedures Procedures (including critical care time)  Medications Ordered in UC Medications - No data to display  Initial Impression / Assessment and Plan / UC Course  I have reviewed the triage vital signs and the nursing notes.  Pertinent labs & imaging results that were available during my care of the patient  were reviewed by me and considered in my medical decision making (see chart for details).   Patient is a pleasant, nontoxic-appearing 32 year old female presenting for evaluation of frontal headache and intermittent palpitations x 1 week as outlined in HPI above.  She reports that she has had an increased amount of stress in the last 2 weeks.  Her palpitations only occur when she is feeling at her children.  Her headaches are in the frontal region and they resolve with taking 40 mg of ibuprofen.  Once the medication wears off the headaches to return.  She denies any upper respiratory symptoms or nasal discharge.  On exam cranial nerves II through XII are intact patient has 2+ grips, upper extremity strength, and lower extremity strength.  DTRs are 2+ globally.  She is wearing a tight fitting baseball cap and she states that that is helping her headache.  She denies any change in vision or auras.  No history of migraines.  She has been taking ibuprofen which helps but the pain returns when the medication wears off.  She does have some tension in her neck but she denies any pain with palpation.  I suspect that the patient is experiencing tension headaches as a result of her increased stress.  I will discharge patient home with Fioricet that she can take if the ibuprofen is not working.  I have also suggested that she try to decrease her stress.  If she develops any return of dizziness that comes on and stays, changes in vision, nausea or vomiting, or chest pain she needs to go to the ER for evaluation.  Patient verbalized understanding of same.   Final Clinical Impressions(s) / UC Diagnoses   Final diagnoses:  Tension headache  Palpitations     Discharge Instructions      Your EKG did not show any abnormalities and I believe your palpitations are most likely related to increased stress.  Your headache is also consistent with tension headache which is a result of stress as well.  Work on ways to  decrease stress in your life.  You may continue to use the ibuprofen as needed for mild to moderate pain.  You may use the Fioricet as needed for severe pain.  Be mindful of this medication may make you drowsy.  You can have 1 to 2 tablets every 8 hours as needed for pain.  If your symptoms continue I recommend following with your primary care provider.  If you have any increase in her headache pain, develop changes in vision, have a return of dizziness, develop chest pain, or develop nausea or vomiting you need to go to the ER for evaluation.     ED Prescriptions     Medication Sig Dispense Auth. Provider   butalbital-acetaminophen-caffeine (FIORICET) 50-325-40 MG tablet Take 2 tablets by mouth every 8 (eight) hours as needed for headache. 20 tablet Becky Augusta, NP      I have reviewed the PDMP during this encounter.   Becky Augusta, NP 08/06/22 1726

## 2022-08-06 NOTE — Discharge Instructions (Addendum)
Your EKG did not show any abnormalities and I believe your palpitations are most likely related to increased stress.  Your headache is also consistent with tension headache which is a result of stress as well.  Work on ways to decrease stress in your life.  You may continue to use the ibuprofen as needed for mild to moderate pain.  You may use the Fioricet as needed for severe pain.  Be mindful of this medication may make you drowsy.  You can have 1 to 2 tablets every 8 hours as needed for pain.  If your symptoms continue I recommend following with your primary care provider.  If you have any increase in her headache pain, develop changes in vision, have a return of dizziness, develop chest pain, or develop nausea or vomiting you need to go to the ER for evaluation.

## 2022-08-06 NOTE — ED Triage Notes (Signed)
Pt c/o headache and heart palpitations x 1 week. Pt states she has a history of hypertension and is taking amlodipine. She does not check her blood pressure at home. She has tried ibuprofen for her headaches with no relief.

## 2022-10-10 ENCOUNTER — Other Ambulatory Visit (HOSPITAL_COMMUNITY)
Admission: RE | Admit: 2022-10-10 | Discharge: 2022-10-10 | Disposition: A | Discharge status: Home / Self Care | Payer: MEDICAID | Source: Ambulatory Visit | Attending: Obstetrics and Gynecology | Admitting: Obstetrics and Gynecology

## 2022-10-10 ENCOUNTER — Ambulatory Visit: Payer: MEDICAID

## 2022-10-10 VITALS — BP 136/92 | HR 88 | Ht 67.0 in | Wt 301.0 lb

## 2022-10-10 DIAGNOSIS — Z113 Encounter for screening for infections with a predominantly sexual mode of transmission: Secondary | ICD-10-CM | POA: Insufficient documentation

## 2022-10-10 DIAGNOSIS — N898 Other specified noninflammatory disorders of vagina: Secondary | ICD-10-CM | POA: Insufficient documentation

## 2022-10-10 NOTE — Patient Instructions (Signed)
Chlamydia, Female  Chlamydia is a sexually transmitted infection (STI). This infection spreads through sexual contact. Chlamydia can occur in different areas of the body, including: The urethra. This is the part of the body that drains urine from the bladder. The cervix. This is the lowest part of the uterus. The throat. The rectum. This condition is not difficult to treat. However, if left untreated, chlamydia can lead to more serious health problems, including pelvic inflammatory disease (PID). PID can increase your risk of being unable to have children. In pregnant women, untreated chlamydia can cause serious complications during pregnancy or health problems for the baby after delivery. What are the causes? This condition is caused by a bacteria called Chlamydia trachomatis. The bacteria are spread from an infected partner during sexual activity. Chlamydia can spread through contact with the genitals, mouth, or rectum. What increases the risk? The following factors may make you more likely to develop this condition: Not using a condom the right way or not using a condom every time you have sex. Having a new sex partner or having more than one sex partner. Being sexually active before age 25. What are the signs or symptoms? In some cases, there are no symptoms, especially early in the infection. If symptoms develop, they may include: Urinating often, or a burning feeling during urination. Redness, soreness, or swelling of the vagina or rectum. Discharge coming from the vagina or rectum. Pain in the abdomen. Pain during sex. Bleeding between menstrual periods or irregular periods. How is this diagnosed? This condition may be diagnosed with: Urine tests. Swab tests. Depending on your symptoms, your health care provider may use a cotton swab to collect a fluid sample from your vagina, rectum, nose, or throat to test for the bacteria. A pelvic exam. How is this treated? This condition is  treated with antibiotic medicines. Follow these instructions at home: Sexual activity Tell your sex partner or partners about your infection. These include any partners for oral, anal, or vaginal sex that you have had within 60 days of when your symptoms started. Sex partners should also be treated, even if they have no signs of the infection. Do not have sex until you and your sex partners have completed treatment and your health care provider says it is okay. If your health care provider prescribed you a single-dose medicine as treatment, wait at least 7 days after taking the medicine before having sex. General instructions Take over-the-counter and prescription medicines as told by your health care provider. Finish all antibiotic medicine even when you start to feel better. It is up to you to get your test results. Ask your health care provider, or the department that is doing the test, when your results will be ready. Keep all follow-up visits. This is important. You may need to be tested for infection again 3 months after treatment. How is this prevented? You can lower your risk of getting chlamydia by: Using latex or polyurethane condoms correctly every time you have sex. Not having multiple sex partners. Asking if your sex partner has been tested for STIs and had negative results. Getting regular health screenings to check for STIs. Contact a health care provider if: You develop new symptoms or your symptoms are getting worse. Your symptoms do not get better after treatment. You have a fever or chills. You have pain during sex. You have irregular menstrual periods, or you have bleeding between periods or after sex. You develop flu-like symptoms, such as night sweats, sore throat,   or muscle aches. You are unable to take your antibiotic medicine as prescribed. Summary Chlamydia is a sexually transmitted infection (STI) that is caused by bacteria. This infection spreads through sexual  contact. This condition is treated with antibiotic medicines. If left untreated, chlamydia can lead to more serious health problems, including pelvic inflammatory disease (PID). Your sex partners will also need to be treated. Do not have sex until both you and your partner have been treated. Take medicines as directed by your health care provider and keep all follow-up visits to ensure your infection has been completely treated. This information is not intended to replace advice given to you by your health care provider. Make sure you discuss any questions you have with your health care provider. Document Revised: 01/16/2021 Document Reviewed: 01/16/2021 Elsevier Patient Education  2024 [employer] Bacterial Vaginosis  Bacterial vaginosis is an infection of the vagina. It happens when too many normal germs (healthy bacteria) grow in the vagina. This infection can make it easier to get other infections from sex (STIs). It is very important for pregnant women to get treated. This infection can cause babies to be born early or at a low birth weight. What are the causes? This infection is caused by an increase in certain germs that grow in the vagina. You cannot get this infection from toilet seats, bedsheets, swimming pools, or things that touch your vagina. What increases the risk? Having sex with a new person or more than one person. Having sex without protection. Douching. Having an intrauterine device (IUD). Smoking. Using drugs or drinking alcohol. These can lead you to do things that are risky. Taking certain antibiotic medicines. Being pregnant. What are the signs or symptoms? Some women have no symptoms. Symptoms may include: A discharge from your vagina. It may be gray or white. It can be watery or foamy. A fishy smell. This can happen after sex or during your menstrual period. Itching in and around your vagina. A feeling of burning or pain when you pee (urinate). How is this  treated? This infection is treated with antibiotic medicines. These may be given to you as: A pill. A cream for your vagina. A medicine that you put into your vagina (suppository). If the infection comes back after treatment, you may need more antibiotics. Follow these instructions at home: Medicines Take over-the-counter and prescription medicines as told by your doctor. Take or use your antibiotic medicine as told by your doctor. Do not stop taking or using it, even if you start to feel better. General instructions If the person you have sex with is a woman, tell her that you have this infection. She will need to follow up with her doctor. If you have a female partner, he does not need to be treated. Do not have sex until you finish treatment. Drink enough fluid to keep your pee pale yellow. Keep your vagina and butt clean. Wash the area with warm water each day. Wipe from front to back after you use the toilet. If you are breastfeeding a baby, ask your doctor if you should keep doing so during treatment. Keep all follow-up visits. How is this prevented? Self-care Do not douche. Use only warm water to wash around your vagina. Wear underwear that is cotton or lined with cotton. Do not wear tight pants and pantyhose, especially in the summer. Safe sex Use protection when you have sex. This includes: Use condoms. Use dental dams. This is a thin layer that protects the mouth during  oral sex. Limit how many people you have sex with. To prevent this infection, it is best to have sex with just one person. Get tested for STIs. The person you have sex with should also get tested. Drugs and alcohol Do not smoke or use any products that contain nicotine or tobacco. If you need help quitting, ask your doctor. Do not use drugs. Do not drink alcohol if: Your doctor tells you not to drink. You are pregnant, may be pregnant, or are planning to become pregnant. If you drink alcohol: Limit how  much you have to 0-1 drink a day. Know how much alcohol is in your drink. In the [place] one drink equals one 12 oz bottle of beer (355 mL), one 5 oz glass of wine (148 mL), or one 1 oz glass of hard liquor (44 mL). Where to find more information Centers for Disease Control and Prevention: FootballExhibition.com.br American Sexual Health Association: www.ashastd.org Office on [employer] Health: http://hoffman.com/ Contact a doctor if: Your symptoms do not get better, even after you are treated. You have more discharge or pain when you pee. You have a fever or chills. You have pain in your belly (abdomen) or in the area between your hips. You have pain with sex. You bleed from your vagina between menstrual periods. Summary This infection can happen when too many germs (bacteria) grow in the vagina. This infection can make it easier to get infections from sex (STIs). Treating this can lower that chance. Get treated if you are pregnant. This infection can cause babies to be born early. Do not stop taking or using your antibiotic medicine, even if you start to feel better. This information is not intended to replace advice given to you by your health care provider. Make sure you discuss any questions you have with your health care provider. Document Revised: 09/24/2019 Document Reviewed: 09/24/2019 Elsevier Patient Education  2024 [employer].

## 2022-10-10 NOTE — Progress Notes (Signed)
    NURSE VISIT NOTE  Subjective:    Patient ID: Ashley Howell, female    DOB: 1990/08/25, 32 y.o.   MRN: [id number]  HPI  Patient is a 32 y.o. [id number] female who presents for a self swab nurse visit. She has been having a fishy vaginal odor and some vaginal itching for about two weeks. Noticed symptoms started after her last period ended. Denies abnormal vaginal bleeding or significant pelvic pain or fever. Denies UTI symptoms. Patient has history of known exposure to STD.   Objective:    BP (!) 136/92   Pulse 88   Ht 5\' 7"  (1.702 m)   Wt (!) 301 lb (136.5 kg)   LMP 09/20/2022 (Approximate)   BMI 47.14 kg/m    Assessment:   1. Vaginal odor   2. Screening for STD (sexually transmitted disease)      Plan:   Aptima swab sent to lab. Treatment: Will wait for results to be treated if needed. ROV prn if symptoms persist or worsen.   Donnetta Hail, CMA

## 2022-10-15 ENCOUNTER — Other Ambulatory Visit: Payer: Self-pay

## 2022-10-15 DIAGNOSIS — N760 Acute vaginitis: Secondary | ICD-10-CM

## 2022-10-15 LAB — CERVICOVAGINAL ANCILLARY ONLY
Bacterial Vaginitis (gardnerella): POSITIVE — AB
Candida Glabrata: NEGATIVE
Candida Vaginitis: NEGATIVE
Chlamydia: NEGATIVE
Comment: NEGATIVE
Comment: NEGATIVE
Comment: NEGATIVE
Comment: NEGATIVE
Comment: NEGATIVE
Comment: NORMAL
Neisseria Gonorrhea: NEGATIVE
Trichomonas: NEGATIVE

## 2022-10-15 MED ORDER — METRONIDAZOLE 500 MG PO TABS
500.0000 mg | ORAL_TABLET | Freq: Two times a day (BID) | ORAL | 0 refills | Status: DC
Start: 2022-10-15 — End: 2023-02-16

## 2022-10-15 NOTE — Progress Notes (Signed)
Spoke with patient regarding results, Rx sent in.

## 2022-12-13 ENCOUNTER — Ambulatory Visit: Payer: MEDICAID | Admitting: Licensed Practical Nurse

## 2022-12-25 ENCOUNTER — Other Ambulatory Visit (HOSPITAL_COMMUNITY)
Admission: RE | Admit: 2022-12-25 | Discharge: 2022-12-25 | Disposition: A | Discharge status: Home / Self Care | Payer: MEDICAID | Source: Ambulatory Visit | Attending: Licensed Practical Nurse | Admitting: Licensed Practical Nurse

## 2022-12-25 ENCOUNTER — Encounter: Payer: Self-pay | Admitting: Licensed Practical Nurse

## 2022-12-25 ENCOUNTER — Ambulatory Visit: Payer: MEDICAID | Admitting: Licensed Practical Nurse

## 2022-12-25 VITALS — BP 142/79 | HR 93 | Wt 309.0 lb

## 2022-12-25 DIAGNOSIS — Z01419 Encounter for gynecological examination (general) (routine) without abnormal findings: Secondary | ICD-10-CM | POA: Insufficient documentation

## 2022-12-25 DIAGNOSIS — Z113 Encounter for screening for infections with a predominantly sexual mode of transmission: Secondary | ICD-10-CM | POA: Insufficient documentation

## 2022-12-25 DIAGNOSIS — N644 Mastodynia: Secondary | ICD-10-CM

## 2022-12-25 NOTE — Progress Notes (Signed)
Gynecology Annual Exam   PCP: [facility]  Chief Complaint: breast pain x 2 months  She has pain on her left breast (outer side) it is throbbing, occurs most days, it is present with or without a bra. She has not tried a different bra. She has not noticed any changes to her breasts such as lumps, inverted nipples or changes to the texture of her skin.   History of Present Illness: Patient is a 32 y.o. [id number] presents for annual exam. The patient has no complaints today.   LMP: Patient's last menstrual period was 12/21/2022 (exact date). Average Interval: regular, monthly  Duration of flow: 3 days Heavy Menses: no Clots: no Intermenstrual Bleeding: no Postcoital Bleeding: no Dysmenorrhea: no  The patient is sexually active with 1 female partner. She currently uses tubal ligation for contraception. She denies dyspareunia.  The patient does perform self breast exams.  There is no notable family history of breast or ovarian cancer in her family.  The patient wears seatbelts: yes.   The patient has regular exercise: yes.  She has recently started to increase her physical activity by walking her driveway which is in a hill multiple times a day, she has made changes to  her diet. She has spoken with her PCP regarding weight loss, her recommended she try diet and exercise before moving on to medications.   The patient denies current symptoms of depression.   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 PCP: [facility] center, recently seen Cardiologist sees next month Dentist: up to date Eye exam: has not had in a while   Review of Systems: ROS see HPI   Past Medical History:  Patient Active Problem List   Diagnosis Date Noted Date Diagnosed   Iron deficiency anemia     Elevated liver function tests     Hypokalemia     Acute gallstone pancreatitis 03/27/2021    Calculus of gallbladder without cholecystitis without  obstruction     History of cesarean section 10/06/2020    Encounter for sterilization     ASD (atrial septal defect) 03/16/2020    Bacterial vaginosis 06/25/2019    Dysplasia of cervix, low grade (CIN 1) 12/11/2018     Repeat Pap smear in 1 year    Obesity, Class III, BMI 40-49.9 (morbid obesity) (HCC) 09/04/2018    Essential hypertension 05/19/2018     [x]  Aspirin 81 mg daily after 12 weeks; discontinue after 36 weeks [x]  baseline labs with CBC, CMP, urine protein/creatinine ratio [ ]  no BP meds unless BPs become elevated - controlled with amlodipine 5 mg [ ]  ultrasound for growth at 28, 32, 36 weeks [x]  Baseline EKG - cards referral 12/8  Current antihypertensives:  amlodipine 5 mg Baseline and surveillance labs (pulled in from [facility], refresh links as needed)  Lab Results  Component Value Date   PLT 365 09/27/2019   CREATININE 0.70 05/19/2019   AST 21 05/19/2019   ALT 26 05/19/2019   PROTCRRATIO 0.14 10/09/2018    Antenatal Testing CHTN - O10.919  Group I  BP < 140/90, no preeclampsia, AGA,  nml AFV, +/- meds    Group II BP > 140/90, on meds, no preeclampsia, AGA, nml AFV  20-28-34-38  20-24-28-32-35-38  32//2 x wk  28//BPP wkly then 32//2 x wk  40 no meds; 39 meds  PRN or 37  Pre-eclampsia  GHTN - O13.9/Preeclampsia without severe features  -  O14.00   Preeclampsia with severe features - O14.10  Q 3-4wks  Q 2 wks  28//BPP wkly then 32//2 x wk  Inpatient  37  PRN or 34       PFO (patent foramen ovale) 02/18/2015     Cardiology consult placed 03/16/20    History of cardiomegaly 10/28/2014     Past Surgical History:  Past Surgical History:  Procedure Laterality Date   CESAREAN SECTION  10/28/2008   CESAREAN SECTION N/A 02/22/2015   Procedure: CESAREAN SECTION;  Surgeon: Nadara Mustard, MD;  Location: [facility] ORS;  Service: Obstetrics;  Laterality: N/A;   CESAREAN SECTION  04/13/2010   CESAREAN SECTION N/A 10/09/2018   Procedure: CESAREAN SECTION;   Surgeon: Natale Milch, MD;  Location: [facility] ORS;  Service: Obstetrics;  Laterality: N/A;   CESAREAN SECTION WITH BILATERAL TUBAL LIGATION Bilateral 10/06/2020   Procedure: CESAREAN SECTION WITH BILATERAL TUBAL LIGATION;  Surgeon: Natale Milch, MD;  Location: [facility] ORS;  Service: Obstetrics;  Laterality: Bilateral;   CHOLECYSTECTOMY     TUBAL LIGATION      Gynecologic History:  Patient's last menstrual period was 12/21/2022 (exact date). Contraception: tubal ligation Last Pap: Results were: NIL and HR HPV negative 2023  11/17/18 ASC-US, HPV pos  COLPO 12/2018 CIN1 , 09/21/19 ASC-US HPV pos   Obstetric History: [id number]  Family History:  Family History  Problem Relation Age of Onset   Hypertension Mother    Stroke Paternal Aunt    Colon cancer Maternal Grandfather     Social History:  Social History   Socioeconomic History   Marital status: Single    Spouse name: Not on file   Number of children: 5   Years of education: Not on file   Highest education level: Not on file  Occupational History   Occupation: CNA  Tobacco Use   Smoking status: Every Day    Current packs/day: 0.50    Average packs/day: 0.5 packs/day for 2.0 years (1.0 ttl pk-yrs)    Types: Cigarettes   Smokeless tobacco: Never  Vaping Use   Vaping status: Every Day  Substance and Sexual Activity   Alcohol use: Yes    Comment: occasionally   Drug use: No   Sexual activity: Yes    Birth control/protection: Surgical, Condom    Comment: Tubal Ligation  Other Topics Concern   Not on file  Social History Narrative   Not on file   Social Determinants of Health   Financial Resource Strain: Low Risk  (07/28/2018)   Overall Financial Resource Strain (CARDIA)    Difficulty of Paying Living Expenses: Not hard at all  Food Insecurity: No Food Insecurity (07/28/2018)   Hunger Vital Sign    Worried About Running Out of Food in the Last Year: Never true    Ran Out of Food in the Last Year: Never true   Transportation Needs: No Transportation Needs (08/15/2020)   Received from [facility]   PRAPARE - Transportation  Physical Activity: Insufficiently Active (07/28/2018)   Exercise Vital Sign    Days of Exercise per Week: 4 days    Minutes of Exercise per Session: 30 min  Stress: No Stress Concern Present (07/28/2018)   [employer] of Occupational Health - Occupational Stress Questionnaire    Feeling of Stress : Not at all  Social Connections: Unknown (08/06/2021)   Received from [facility]   Social Network    Social Network: Not on file  Intimate Partner  Violence: Unknown (07/14/2021)   Received from [facility], [facility]   HITS    Physically Hurt: Not on file    Insult or Talk Down To: Not on file    Threaten Physical Harm: Not on file    Scream or Curse: Not on file    Allergies:  No Known Allergies  Medications: Prior to Admission medications   Medication Sig Start Date End Date Taking? Authorizing Provider  amLODipine (NORVASC) 5 MG tablet Take 5 mg by mouth daily.    [provider]  butalbital-acetaminophen-caffeine (FIORICET) [phone] MG tablet Take 2 tablets by mouth every 8 (eight) hours as needed for headache. 08/06/22 08/06/23  Becky Augusta, NP  metroNIDAZOLE (FLAGYL) 500 MG tablet Take 1 tablet (500 mg total) by mouth 2 (two) times daily. Patient not taking: Reported on 12/25/2022 10/15/22   Ellwood Sayers, CNM  RETIN-A 0.01 % gel Apply topically. 08/28/22   [provider]    Physical Exam Vitals: Blood pressure (!) 142/79, pulse 93, weight (!) 309 lb (140.2 kg).  General: NAD HEENT: normocephalic, anicteric Thyroid: no enlargement, no palpable nodules Pulmonary: No increased work of breathing, CTAB Cardiovascular: RRR, distal pulses 2+ Breast: Breast symmetrical, no tenderness, no palpable nodules or masses, no skin or nipple retraction present, no nipple discharge.  No axillary or supraclavicular  lymphadenopathy. Abdomen: NABS, soft, non-tender, non-distended.  Umbilicus without lesions.  No hepatomegaly, splenomegaly or masses palpable. No evidence of hernia  Genitourinary:  External: Normal external female genitalia.  Normal urethral meatus, normal Bartholin's and Skene's glands.    Vagina: Normal vaginal mucosa, no evidence of prolapse.  Some tone   Cervix: Grossly normal in appearance, no bleeding  Uterus: Non-enlarged, mobile, normal contour.  No CMT  Adnexa: ovaries non-enlarged, no adnexal masses  Rectal: deferred  Lymphatic: no evidence of inguinal lymphadenopathy Extremities: no edema, erythema, or tenderness Neurologic: Grossly intact Psychiatric: mood appropriate, affect full   Assessment: 32 y.o. [id number] routine annual exam  Plan: Problem List Items Addressed This Visit   None Visit Diagnoses     Breast pain, left    -  Primary   Relevant Orders   [place] LIMITED ULTRASOUND INCLUDING AXILLA LEFT BREAST    Well woman exam       Relevant Orders   HEP, RPR, HIV Panel   Hepatitis C antibody   Cervicovaginal ancillary only   Screening examination for venereal disease       Relevant Orders   HEP, RPR, HIV Panel   Hepatitis C antibody   Cervicovaginal ancillary only       2) STI screening  wasoffered and accepted  2)  ASCCP guidelines and rational discussed.  Patient opts for every 3 years screening interval  3) Contraception - the patient is currently using  tubal ligation.  She is happy with her current form of contraception and plans to continue  4) Routine healthcare maintenance including cholesterol, diabetes screening discussed managed by PCP  5) Breast pain: [place] ordered and handout given  6) CHTN: managed by PCP   Carie Caddy, CNM  [facility] 12/25/2022, 2:28 PM

## 2022-12-27 LAB — CERVICOVAGINAL ANCILLARY ONLY
Chlamydia: NEGATIVE
Comment: NEGATIVE
Comment: NEGATIVE
Comment: NORMAL
Neisseria Gonorrhea: NEGATIVE
Trichomonas: NEGATIVE

## 2023-01-28 NOTE — Progress Notes (Deleted)
NO SHOW

## 2023-01-29 ENCOUNTER — Ambulatory Visit: Payer: MEDICAID | Attending: Cardiovascular Disease | Admitting: Cardiovascular Disease

## 2023-01-29 DIAGNOSIS — Q2112 Patent foramen ovale: Secondary | ICD-10-CM

## 2023-01-29 DIAGNOSIS — Q211 Atrial septal defect, unspecified: Secondary | ICD-10-CM

## 2023-01-29 DIAGNOSIS — I10 Essential (primary) hypertension: Secondary | ICD-10-CM

## 2023-02-05 ENCOUNTER — Other Ambulatory Visit: Payer: Self-pay

## 2023-02-05 DIAGNOSIS — B379 Candidiasis, unspecified: Secondary | ICD-10-CM

## 2023-02-05 MED ORDER — FLUCONAZOLE 150 MG PO TABS
150.0000 mg | ORAL_TABLET | ORAL | 1 refills | Status: DC
Start: 2023-02-05 — End: 2023-04-30

## 2023-02-16 ENCOUNTER — Emergency Department
Admission: EM | Admit: 2023-02-16 | Discharge: 2023-02-16 | Disposition: A | Discharge status: Home / Self Care | Payer: MEDICAID | Attending: Emergency Medicine | Admitting: Emergency Medicine

## 2023-02-16 ENCOUNTER — Other Ambulatory Visit: Payer: Self-pay

## 2023-02-16 DIAGNOSIS — N760 Acute vaginitis: Secondary | ICD-10-CM | POA: Insufficient documentation

## 2023-02-16 DIAGNOSIS — I10 Essential (primary) hypertension: Secondary | ICD-10-CM | POA: Insufficient documentation

## 2023-02-16 DIAGNOSIS — B9689 Other specified bacterial agents as the cause of diseases classified elsewhere: Secondary | ICD-10-CM

## 2023-02-16 DIAGNOSIS — R102 Pelvic and perineal pain: Secondary | ICD-10-CM | POA: Diagnosis present

## 2023-02-16 LAB — URINALYSIS, ROUTINE W REFLEX MICROSCOPIC
Bilirubin Urine: NEGATIVE
Glucose, UA: NEGATIVE mg/dL
Ketones, ur: NEGATIVE mg/dL
Nitrite: NEGATIVE
Protein, ur: NEGATIVE mg/dL
Specific Gravity, Urine: 1.027 (ref 1.005–1.030)
pH: 5 (ref 5.0–8.0)

## 2023-02-16 LAB — CHLAMYDIA/NGC RT PCR (ARMC ONLY)
Chlamydia Tr: NOT DETECTED
N gonorrhoeae: NOT DETECTED

## 2023-02-16 LAB — WET PREP, GENITAL
Sperm: NONE SEEN
Trich, Wet Prep: NONE SEEN
WBC, Wet Prep HPF POC: <10 (ref <10)
Yeast Wet Prep HPF POC: NONE SEEN

## 2023-02-16 LAB — POC URINE PREG, ED: Preg Test, Ur: NEGATIVE

## 2023-02-16 MED ORDER — METRONIDAZOLE 500 MG PO TABS: 500.0000 mg | ORAL_TABLET | Freq: Two times a day (BID) | ORAL | 0 refills | Status: AC

## 2023-02-16 MED ORDER — IBUPROFEN 800 MG PO TABS
800.0000 mg | ORAL_TABLET | Freq: Once | ORAL | Status: AC
  Administered 2023-02-16: 800 mg via ORAL
  Filled 2023-02-16: qty 1

## 2023-02-16 MED ORDER — METRONIDAZOLE 500 MG PO TABS
500.0000 mg | ORAL_TABLET | Freq: Once | ORAL | Status: AC
  Administered 2023-02-16: 500 mg via ORAL
  Filled 2023-02-16: qty 1

## 2023-02-16 NOTE — Discharge Instructions (Addendum)
Take the antibiotics as prescribed.  Follow-up with your OB/GYN as needed.  I have also attached some information about BV for you to review.

## 2023-02-16 NOTE — ED Triage Notes (Signed)
Pt to ED for vaginal pain, states took prescribed diflucan for yeast infx after abx from dental work. Also c/o dysuria. Asking for STI testing. Hx BV, trich and was treated.

## 2023-02-16 NOTE — ED Notes (Signed)
EDP in room at this time 

## 2023-02-16 NOTE — ED Provider Notes (Signed)
[facility] Provider Note    Event Date/Time   First MD Initiated Contact with Patient 02/16/23 1810     (approximate)   History   Vaginal Pain   HPI  Ashley Howell is a 32 y.o. female with PMH of anxiety, hypertension, cardiomegaly, PFO and ASD who presents for evaluation of vaginal pain.  Patient states over a week ago she had some dental work done and took an antibiotic from which she developed a yeast infection.  She completed 2 rounds of Diflucan for this and is still having symptoms which is what brought her in today.  Patient states she has dysuria, some itching and has noticed a foul-smelling odor.  She denies any vaginal discharge.      Physical Exam   Triage Vital Signs: ED Triage Vitals  Encounter Vitals Group     BP 02/16/23 1752 (!) 137/102     Systolic BP Percentile --      Diastolic BP Percentile --      Pulse Rate 02/16/23 1752 94     Resp 02/16/23 1752 16     Temp 02/16/23 1752 97.8 F (36.6 C)     Temp Source 02/16/23 1752 Oral     SpO2 02/16/23 1752 100 %     Weight 02/16/23 1753 280 lb (127 kg)     Height 02/16/23 1753 5\' 7"  (1.702 m)     Head Circumference --      Peak Flow --      Pain Score 02/16/23 1750 10     Pain Loc --      Pain Education --      Exclude from Growth Chart --     Most recent vital signs: Vitals:   02/16/23 1752  BP: (!) 137/102  Pulse: 94  Resp: 16  Temp: 97.8 F (36.6 C)  SpO2: 100%   General: Awake, no distress.  CV:  Good peripheral perfusion.  RRR. Resp:  Normal effort.  CTAB. Abd:  No distention.  Soft, mild tenderness to palpation over the umbilicus, bowel sounds appropriate. Other:  Normal-appearing vulva and labia with no lesions, white discharge in the vaginal vault, no cervical motion tenderness, no adnexal tenderness.   ED Results / Procedures / Treatments   Labs (all labs ordered are listed, but only abnormal results are displayed) Labs Reviewed  WET PREP, GENITAL -  Abnormal; Notable for the following components:      Result Value   Clue Cells Wet Prep HPF POC PRESENT (*)    All other components within normal limits  URINALYSIS, ROUTINE W REFLEX MICROSCOPIC - Abnormal; Notable for the following components:   Color, Urine YELLOW (*)    APPearance HAZY (*)    Hgb urine dipstick MODERATE (*)    Leukocytes,Ua SMALL (*)    Bacteria, UA FEW (*)    All other components within normal limits  CHLAMYDIA/NGC RT PCR ([facility] ONLY)            POC URINE PREG, ED    PROCEDURES:  Critical Care performed: No  Procedures   MEDICATIONS ORDERED IN ED: Medications  metroNIDAZOLE (FLAGYL) tablet 500 mg (has no administration in time range)  ibuprofen (ADVIL) tablet 800 mg (800 mg Oral Given 02/16/23 1949)     IMPRESSION / MDM / ASSESSMENT AND PLAN / ED COURSE  I reviewed the triage vital signs and the nursing notes.  32 year old female presents for evaluation of vaginal pain.  Patient was hypertensive in triage but does have history of hypertension, vital signs stable otherwise.  Patient NAD on exam.  Differential diagnosis includes, but is not limited to, BV, yeast, trichomonas, gonorrhea, chlamydia.  Patient's presentation is most consistent with acute complicated illness / injury requiring diagnostic workup.  Pregnancy test is negative.  Gonorrhea and Chlamydia test negative.  Wet prep showed clue cells.  Urinalysis shows moderate hemoglobin with some small leukocytes and few bacteria.  Patient is finishing up her menstrual cycle so I believe this is the explanation for the hemoglobin in her urine.  I will treat patient for bacterial vaginosis.  She was given her first dose of antibiotics while in the ED today.  She was agreeable to plan, voiced understanding and was stable at discharge.    FINAL CLINICAL IMPRESSION(S) / ED DIAGNOSES   Final diagnoses:  Bacterial vaginosis     Rx / DC Orders   ED Discharge Orders           Ordered    metroNIDAZOLE (FLAGYL) 500 MG tablet  2 times daily        02/16/23 2130             Note:  This document was prepared using Dragon voice recognition software and may include unintentional dictation errors.   Cameron Ali, PA-C 02/16/23 2139    Janith Lima, MD 02/17/23 [phone]

## 2023-02-27 ENCOUNTER — Ambulatory Visit: Payer: MEDICAID

## 2023-02-27 DIAGNOSIS — Z23 Encounter for immunization: Secondary | ICD-10-CM

## 2023-02-27 DIAGNOSIS — Z719 Counseling, unspecified: Secondary | ICD-10-CM

## 2023-02-27 NOTE — Progress Notes (Signed)
Car service provided by nurse clinic for immunizations. Patient requesting Varicella vaccine as son had recently been diagnosed with Chickenpox. Voices no concerns. VIS reviewed and given to patient. Vaccine (Varicella) tolerated well; no issues noted. NCIR updated and copy given to patient.   Abagail Kitchens, RN

## 2023-03-29 ENCOUNTER — Emergency Department
Admission: EM | Admit: 2023-03-29 | Discharge: 2023-03-29 | Disposition: A | Discharge status: Home / Self Care | Payer: MEDICAID | Attending: Emergency Medicine | Admitting: Emergency Medicine

## 2023-03-29 ENCOUNTER — Emergency Department: Payer: MEDICAID

## 2023-03-29 ENCOUNTER — Other Ambulatory Visit: Payer: Self-pay

## 2023-03-29 DIAGNOSIS — Z20822 Contact with and (suspected) exposure to covid-19: Secondary | ICD-10-CM | POA: Diagnosis not present

## 2023-03-29 DIAGNOSIS — B349 Viral infection, unspecified: Secondary | ICD-10-CM | POA: Diagnosis not present

## 2023-03-29 DIAGNOSIS — R0789 Other chest pain: Secondary | ICD-10-CM

## 2023-03-29 LAB — RESP PANEL BY RT-PCR (RSV, FLU A&B, COVID)  RVPGX2
Influenza A by PCR: NEGATIVE
Influenza B by PCR: NEGATIVE
Resp Syncytial Virus by PCR: NEGATIVE
SARS Coronavirus 2 by RT PCR: NEGATIVE

## 2023-03-29 LAB — CBC
HCT: 39.1 % (ref 36.0–46.0)
Hemoglobin: 12.2 g/dL (ref 12.0–15.0)
MCH: 25 pg — ABNORMAL LOW (ref 26.0–34.0)
MCHC: 31.2 g/dL (ref 30.0–36.0)
MCV: 80.1 fL (ref 80.0–100.0)
Platelets: 363 10*3/uL (ref 150–400)
RBC: 4.88 MIL/uL (ref 3.87–5.11)
RDW: 14.2 % (ref 11.5–15.5)
WBC: 9.9 10*3/uL (ref 4.0–10.5)
nRBC: 0 % (ref 0.0–0.2)

## 2023-03-29 LAB — BASIC METABOLIC PANEL
Anion gap: 10 (ref 5–15)
BUN: 11 mg/dL (ref 6–20)
CO2: 23 mmol/L (ref 22–32)
Calcium: 9.1 mg/dL (ref 8.9–10.3)
Chloride: 100 mmol/L (ref 98–111)
Creatinine, Ser: 0.78 mg/dL (ref 0.44–1.00)
GFR, Estimated: >60 mL/min (ref >60)
Glucose, Bld: 120 mg/dL — ABNORMAL HIGH (ref 70–99)
Potassium: 4 mmol/L (ref 3.5–5.1)
Sodium: 133 mmol/L — ABNORMAL LOW (ref 135–145)

## 2023-03-29 LAB — POC URINE PREG, ED: Preg Test, Ur: NEGATIVE

## 2023-03-29 LAB — D-DIMER, QUANTITATIVE: D-Dimer, Quant: 0.33 ug{FEU}/mL (ref 0.00–0.50)

## 2023-03-29 LAB — TROPONIN I (HIGH SENSITIVITY): Troponin I (High Sensitivity): 3 ng/L (ref <18)

## 2023-03-29 NOTE — ED Provider Notes (Signed)
[facility] Provider Note    Event Date/Time   First MD Initiated Contact with Patient 03/29/23 [phone]     (approximate)   History   Chest Pain   HPI  Ashley Howell is a 32 y.o. female for about 1 week sense of xperiencing mild achiness, slight cough, as well as discomfort in her chest.  She feels like an achy sensation in her chest that radiates towards her back.  No nausea or vomiting.  No leg swelling.  No shortness of breath.  No wheezing  Symptoms been present for about 1 week.  She has a routine follow-up with cardiology scheduled for 1 month, they have been monitoring her for a possible hole in the heart, but she relates that thus far they have never found one  Denies pregnancy.  No fever.  No history of long trips or travel.  No leg swelling.  No history of blood clots.  Does not take any birth control or estrogen.  She does vape     Physical Exam   Triage Vital Signs: ED Triage Vitals  Encounter Vitals Group     BP 03/29/23 0909 (!) 140/73     Systolic BP Percentile --      Diastolic BP Percentile --      Pulse Rate 03/29/23 0909 (!) 106     Resp 03/29/23 0909 19     Temp 03/29/23 0909 98.3 F (36.8 C)     Temp Source 03/29/23 0909 Oral     SpO2 03/29/23 0909 99 %     Weight 03/29/23 0910 279 lb 15.8 oz (127 kg)     Height 03/29/23 0910 5\' 7"  (1.702 m)     Head Circumference --      Peak Flow --      Pain Score 03/29/23 0910 8     Pain Loc --      Pain Education --      Exclude from Growth Chart --     Most recent vital signs: Vitals:   03/29/23 0909  BP: (!) 140/73  Pulse: (!) 106  Resp: 19  Temp: 98.3 F (36.8 C)  SpO2: 99%     General: Awake, no distress.  Ambulates without distress.  She is very pleasant.  Nontoxic well-appearing CV:  Good peripheral perfusion.  Normal tones heart rate approximately 90.  Careful auscultation no murmurs noted Resp:  Normal effort.  Clear bilateral with normal effort Abd:  No  distention.  Soft nontender Other:  No lower extremity edema venous cords or congestion.  Reports a slight achiness in her calf muscles bilaterally   ED Results / Procedures / Treatments   Labs (all labs ordered are listed, but only abnormal results are displayed) Labs Reviewed  BASIC METABOLIC PANEL - Abnormal; Notable for the following components:      Result Value   Sodium 133 (*)    Glucose, Bld 120 (*)    All other components within normal limits  CBC - Abnormal; Notable for the following components:   MCH 25.0 (*)    All other components within normal limits  RESP PANEL BY RT-PCR (RSV, FLU A&B, COVID)  [id number]  D-DIMER, QUANTITATIVE  POC URINE PREG, ED  TROPONIN I (HIGH SENSITIVITY)     EKG  And interpreted by me at 915 heart rate 105 QRS 80 QTc 450 Sinus tachycardia.  Suspect possible LVH with repolarization type abnormality.  Compared with previous EKG from April of this year,  no significant ST segment abnormality changes.  Rate has increased   RADIOLOGY Chest x-ray interpreted by me as negative for acute finding    PROCEDURES:  Critical Care performed: No  Procedures   MEDICATIONS ORDERED IN ED: Medications - No data to display   IMPRESSION / MDM / ASSESSMENT AND PLAN / ED COURSE  I reviewed the triage vital signs and the nursing notes.                              Differential diagnosis includes, but is not limited to, possible viral illness, myalgia, atypical chest pain.  No signs or symptoms highly suggestive of ACS, PE, dissection pneumothorax etc.  Given her clinical constellation of symptoms achiness chest discomfort as well as a very reassuring workup including normal troponin, negative D-dimer with low pretest PE probability and no notable risk factors patient appears appropriate for outpatient follow-up and careful return precautions.  Cardiology note from 2016 had question if the patient might have a patent foramen ovale.  She had a follow-up in  September 2020 which demonstrated no signs of ASD or VSD and low suspicion PFO.    Patient's presentation is most consistent with acute complicated illness / injury requiring diagnostic workup.  Patient low risk for ACS.  Appropriate for single troponin, symptoms greater than several hours with a single normal troponin seems exceedingly unlikely represent ACS  ----------------------------------------- 1:03 PM on 03/29/2023 ----------------------------------------- Patient resting comfortably at this time Return precautions and treatment recommendations and follow-up discussed with the patient who is agreeable with the plan.       FINAL CLINICAL IMPRESSION(S) / ED DIAGNOSES   Final diagnoses:  Viral syndrome  Atypical chest pain     Rx / DC Orders   ED Discharge Orders     None        Note:  This document was prepared using Dragon voice recognition software and may include unintentional dictation errors.   Sharyn Creamer, MD 03/29/23 [phone]

## 2023-03-29 NOTE — ED Triage Notes (Signed)
Pt here with cp since Monday. Pt states pain is centered and radiates to her back and has been intermittent. Pt states pain became worse last night while she was at work. Pt describes the pain as pressure. Pt states she has a cardiac hx as well. Pt denies NVD.

## 2023-04-29 NOTE — Progress Notes (Unsigned)
Cardiology Office Note  Date:  04/30/2023   ID:  Ashley Howell, DOB Apr 04, 1991, MRN [id number]  PCP:  [facility]   Chief Complaint  Patient presents with   [facility] ER follow up; chest pain     "Doing well."     HPI:  Ashley Howell is a 33 y.o. female with a hx of  gestational hypertension,  No significant PFO or ASD by echo in 2020, bubble study negative HTN,  tobacco use  presents today for follow up of hypertension   Last seen by one of our providers in clinic January 2022 Last seen by myself on video visit April 2020  Seen in the emergency room March 29, 2019 for, achiness, cough, discomfort in the chest, tired,  Workup was negative Abnormal EKG, likely lead placement  In follow-up today he reports feeling well Denies significant chest pain or shortness of breath Discussed recent evaluation in the emergency room, reports symptoms have resolved  Has 5 children, wonders if she had a viral syndrome from one of them  Blood pressure borderline high, not checking at home, works in skilled nursing facility  Denies significant lower extremity edema, no PND orthopnea Trying to lose weight through dietary changes  Uses vapor cigarette, not regular cigarettes  EKG personally reviewed by myself on todays visit EKG Interpretation Date/Time:  Tuesday April 30 2023 10:18:04 EST Ventricular Rate:  74 PR Interval:  146 QRS Duration:  78 QT Interval:  386 QTC Calculation: 428 R Axis:   40  Text Interpretation: Normal sinus rhythm Normal ECG When compared with ECG of 29-Mar-2023 09:07, No significant change was found Confirmed by Julien Nordmann [phone]) on 04/30/2023 10:19:10 AM    echo bubble study 01/01/2019 with LVEF 60 to 65%, RV normal size and function, no ASD noted, low suspicion of PFO given negative contrast study though very small PFO cannot be definitively excluded.    PMH:   has a past medical history of Anxiety, ASD (atrial  septal defect), Benign essential hypertension, antepartum (05/19/2018), cardiomegaly, Hypertension, PFO (patent foramen ovale), Shortness of breath dyspnea, and Spinal headache.  PSH:    Past Surgical History:  Procedure Laterality Date   CESAREAN SECTION  10/28/2008   CESAREAN SECTION N/A 02/22/2015   Procedure: CESAREAN SECTION;  Surgeon: Nadara Mustard, MD;  Location: [facility] ORS;  Service: Obstetrics;  Laterality: N/A;   CESAREAN SECTION  04/13/2010   CESAREAN SECTION N/A 10/09/2018   Procedure: CESAREAN SECTION;  Surgeon: Natale Milch, MD;  Location: [facility] ORS;  Service: Obstetrics;  Laterality: N/A;   CESAREAN SECTION WITH BILATERAL TUBAL LIGATION Bilateral 10/06/2020   Procedure: CESAREAN SECTION WITH BILATERAL TUBAL LIGATION;  Surgeon: Natale Milch, MD;  Location: [facility] ORS;  Service: Obstetrics;  Laterality: Bilateral;   CHOLECYSTECTOMY     TUBAL LIGATION      Current Outpatient Medications  Medication Sig Dispense Refill   amLODipine (NORVASC) 5 MG tablet Take 5 mg by mouth daily.     butalbital-acetaminophen-caffeine (FIORICET) 50-325-40 MG tablet Take 2 tablets by mouth every 8 (eight) hours as needed for headache. 20 tablet 0   RETIN-A 0.01 % gel Apply topically.     No current facility-administered medications for this visit.    Allergies:   Patient has no known allergies.   Social History:  The patient  reports that she has quit smoking. Her smoking use included cigarettes. She has a 1 pack-year smoking history. She has never used smokeless tobacco.  She reports current alcohol use. She reports that she does not use drugs.   Family History:   family history includes Colon cancer in her maternal grandfather; Hypertension in her mother; Stroke in her paternal aunt.    Review of Systems: Review of Systems  Constitutional: Negative.   HENT: Negative.    Respiratory: Negative.    Cardiovascular: Negative.   Gastrointestinal: Negative.   Musculoskeletal:  Negative.   Neurological: Negative.   Psychiatric/Behavioral: Negative.    All other systems reviewed and are negative.   PHYSICAL EXAM: VS:  BP (!) 140/78 (BP Location: Left Arm, Patient Position: Sitting, Cuff Size: Large)   Pulse 74   Ht 5\' 7"  (1.702 m)   Wt (!) 320 lb 8 oz (145.4 kg)   SpO2 98%   BMI 50.20 kg/m  , BMI Body mass index is 50.2 kg/m. GEN: Well nourished, well developed, in no acute distress HEENT: normal Neck: no JVD, carotid bruits, or masses Cardiac: RRR; no murmurs, rubs, or gallops,no edema  Respiratory:  clear to auscultation bilaterally, normal work of breathing GI: soft, nontender, nondistended, + BS MS: no deformity or atrophy Skin: warm and dry, no rash Neuro:  Strength and sensation are intact Psych: euthymic mood, full affect  Recent Labs: 03/29/2023: BUN 11; Creatinine, Ser 0.78; Hemoglobin 12.2; Platelets 363; Potassium 4.0; Sodium 133    Lipid Panel Lab Results  Component Value Date   CHOL 166 03/26/2021   HDL 50 03/26/2021   LDLCALC 101 (H) 03/26/2021   TRIG 84 03/27/2021      Wt Readings from Last 3 Encounters:  04/30/23 (!) 320 lb 8 oz (145.4 kg)  03/29/23 279 lb 15.8 oz (127 kg)  02/16/23 280 lb (127 kg)      ASSESSMENT AND PLAN:  Problem List Items Addressed This Visit       Cardiology Problems   Essential hypertension - Primary   Relevant Orders   EKG 12-Lead (Completed)   PFO (patent foramen ovale)     Other   Obesity, Class III, BMI 40-49.9 (morbid obesity) (HCC)   Essential hypertension Recommend close monitoring of blood pressure at home and call us if numbers run high For high blood pressure could increase amlodipine up to 5 twice daily or 10 daily  Morbid obesity We have encouraged continued exercise, careful diet management in an effort to lose weight.  No PFO on echocardiogram 2020, bubble study negative No further workup needed Detail that PFO, even if present, would not cause any symptoms such as  chest pain or malaise  Hospital records reviewed in preparation for today's visit   Signed, Dossie Arbour, M.D., Ph.D. [facility] [place], [place] [phone]

## 2023-04-30 ENCOUNTER — Encounter: Payer: Self-pay | Admitting: Cardiovascular Disease

## 2023-04-30 ENCOUNTER — Ambulatory Visit: Payer: MEDICAID | Attending: Cardiovascular Disease | Admitting: Cardiovascular Disease

## 2023-04-30 VITALS — BP 140/78 | HR 74 | Ht 67.0 in | Wt 320.5 lb

## 2023-04-30 DIAGNOSIS — Q2112 Patent foramen ovale: Secondary | ICD-10-CM | POA: Diagnosis not present

## 2023-04-30 DIAGNOSIS — I10 Essential (primary) hypertension: Secondary | ICD-10-CM | POA: Diagnosis not present

## 2023-04-30 NOTE — Patient Instructions (Signed)

## 2023-05-06 ENCOUNTER — Encounter: Payer: Self-pay | Admitting: Licensed Practical Nurse

## 2023-05-08 ENCOUNTER — Ambulatory Visit: Payer: MEDICAID

## 2023-05-10 ENCOUNTER — Ambulatory Visit (INDEPENDENT_AMBULATORY_CARE_PROVIDER_SITE_OTHER): Payer: MEDICAID

## 2023-05-10 ENCOUNTER — Other Ambulatory Visit (HOSPITAL_COMMUNITY)
Admission: RE | Admit: 2023-05-10 | Discharge: 2023-05-10 | Disposition: A | Discharge status: Home / Self Care | Payer: MEDICAID | Source: Ambulatory Visit | Attending: Licensed Practical Nurse | Admitting: Licensed Practical Nurse

## 2023-05-10 VITALS — BP 138/83 | HR 73 | Ht 67.0 in | Wt 315.9 lb

## 2023-05-10 DIAGNOSIS — N760 Acute vaginitis: Secondary | ICD-10-CM

## 2023-05-10 DIAGNOSIS — Z113 Encounter for screening for infections with a predominantly sexual mode of transmission: Secondary | ICD-10-CM | POA: Insufficient documentation

## 2023-05-10 DIAGNOSIS — N898 Other specified noninflammatory disorders of vagina: Secondary | ICD-10-CM

## 2023-05-10 NOTE — Progress Notes (Signed)
    NURSE VISIT NOTE  Subjective:    Patient ID: Ashley Howell, female    DOB: [birth year], 33 y.o.   MRN: [id number]  HPI  Patient is a 33 y.o. [id number] female who presents for white, creamy, and malodorous vaginal discharge for 1 week(s). Denies abnormal vaginal bleeding or significant pelvic pain or fever. denies UTI symptoms. Patient denies history of known exposure to STD.   Objective:    BP 138/83   Pulse 73   Ht 5\' 7"  (1.702 m)   Wt (!) 315 lb 14.4 oz (143.3 kg)   BMI 49.48 kg/m     Assessment:   1. Acute vaginitis   2. Screening for STD (sexually transmitted disease)   3. Vaginal discharge   4. Vaginal itching    Plan:   GC and chlamydia DNA  probe sent to lab. Treatment: await results for further follow-up ROV prn if symptoms persist or worsen.   Loman Chroman, CMA

## 2023-05-13 ENCOUNTER — Other Ambulatory Visit: Payer: Self-pay

## 2023-05-13 DIAGNOSIS — A599 Trichomoniasis, unspecified: Secondary | ICD-10-CM

## 2023-05-13 DIAGNOSIS — B9689 Other specified bacterial agents as the cause of diseases classified elsewhere: Secondary | ICD-10-CM

## 2023-05-13 LAB — CERVICOVAGINAL ANCILLARY ONLY
Bacterial Vaginitis (gardnerella): POSITIVE — AB
Candida Glabrata: NEGATIVE
Candida Vaginitis: NEGATIVE
Chlamydia: NEGATIVE
Comment: NEGATIVE
Comment: NEGATIVE
Comment: NEGATIVE
Comment: NEGATIVE
Comment: NEGATIVE
Comment: NORMAL
Neisseria Gonorrhea: NEGATIVE
Trichomonas: POSITIVE — AB

## 2023-05-13 MED ORDER — METRONIDAZOLE 500 MG PO TABS: 500.0000 mg | ORAL_TABLET | Freq: Two times a day (BID) | ORAL | 0 refills | Status: AC

## 2023-05-13 NOTE — Progress Notes (Signed)
Patient notified via phone & my chart of BV/TRICH +, treatment/STI report sent, advised to schedule 4 week TOC. Abstain from alcohol/intercourse/use condoms. Partner needs treatment.

## 2023-06-25 ENCOUNTER — Encounter: Payer: Self-pay | Admitting: Family Medicine

## 2023-06-25 ENCOUNTER — Ambulatory Visit: Payer: MEDICAID | Admitting: Family Medicine

## 2023-06-25 DIAGNOSIS — B379 Candidiasis, unspecified: Secondary | ICD-10-CM

## 2023-06-25 DIAGNOSIS — A599 Trichomoniasis, unspecified: Secondary | ICD-10-CM

## 2023-06-25 DIAGNOSIS — B9689 Other specified bacterial agents as the cause of diseases classified elsewhere: Secondary | ICD-10-CM

## 2023-06-25 DIAGNOSIS — Z113 Encounter for screening for infections with a predominantly sexual mode of transmission: Secondary | ICD-10-CM | POA: Diagnosis not present

## 2023-06-25 LAB — WET PREP FOR TRICH, YEAST, CLUE: Trichomonas Exam: NEGATIVE

## 2023-06-25 LAB — HM HIV SCREENING LAB: HM HIV Screening: NEGATIVE

## 2023-06-25 MED ORDER — FLUCONAZOLE 150 MG PO TABS: 150.0000 mg | ORAL_TABLET | ORAL | 2 refills | Status: AC

## 2023-06-25 NOTE — Patient Instructions (Signed)
 STI screening - Today we obtained a vaginal swab to screen for gonorrhea, chlamydia, and trichomonas - We also obtained a blood sample to screen for HIV and syphilis - If the results are normal, I will send you a letter or MyChart message. If the results are abnormal, I will give you a call.    Estimated time frame for results collected at the [facility] Department: Same day Trichomonas Yeast BV (bacterial vaginosis)  Within 1-2 weeks Gonorrhea Chlamydia  Within 2-3 weeks HIV Syphilis Hepatitis B Hepatitis C

## 2023-06-25 NOTE — Progress Notes (Unsigned)
 [facility] Department STI clinic [address], [address] [place] [address] Main phone: [phone]  STI screening visit  Subjective:  Ashley Howell is a 33 y.o. female being seen today for an STI screening visit. The patient reports they do have symptoms.  Patient reports that they do not desire a pregnancy in the next year.   They reported they are not interested in discussing contraception today; patient is s/p BTL 10/06/2020.  Patient's last menstrual period was 06/18/2023 (approximate).  Patient has the following medical conditions:  Patient Active Problem List   Diagnosis Date Noted   Yeast infection 06/27/2023   Iron deficiency anemia    Elevated liver function tests    Acute gallstone pancreatitis 03/27/2021   Calculus of gallbladder without cholecystitis without obstruction    History of cesarean section 10/06/2020   H/o tubal ligation 10/06/2020    ASD (atrial septal defect) 03/16/2020   Bacterial vaginosis 06/25/2019   Dysplasia of cervix, low grade (CIN 1) 12/11/2018   Obesity, Class III, BMI 40-49.9 (morbid obesity) (HCC) 09/04/2018   Essential hypertension 05/19/2018   PFO (patent foramen ovale) 02/18/2015   History of cardiomegaly 10/28/2014   Chief Complaint  Patient presents with   SEXUALLY TRANSMITTED DISEASE    Cloudy discharge    HPI Patient reports history of trichomonas, reports she was treated and that her partner told her they got treated as well. Now with return of discharge and odor, concerned for re-infection. She used amoxicillin last week for another infection; vaginal discharge, odor, and pruritus started shortly afterwards.   Does the patient using douching products? No  Last HIV test per patient/review of record was  Lab Results  Component Value Date   HMHIVSCREEN Negative - Validated 03/31/2018    Lab Results  Component Value Date   HIV Non Reactive 12/25/2022    Last HEPC test per patient/review of  record was No results found for: "HMHEPCSCREEN" No components found for: "HEPC"   Last HEPB test per patient/review of record was No components found for: "HMHEPBSCREEN"   Patient reports last pap was:      Component Value Date/Time   DIAGPAP  12/04/2021 1403    - Negative for intraepithelial lesion or malignancy (NILM)   DIAGPAP  03/16/2020 1417    - Negative for intraepithelial lesion or malignancy (NILM)   DIAGPAP (A) 09/21/2019 0839    - Atypical squamous cells of undetermined significance (ASC-US)   HPVHIGH Negative 12/04/2021 1403   HPVHIGH Positive (A) 09/21/2019 0839   ADEQPAP  12/04/2021 1403    Satisfactory for evaluation; transformation zone component PRESENT.   ADEQPAP  03/16/2020 1417    Satisfactory for evaluation; transformation zone component ABSENT.   ADEQPAP  09/21/2019 0839    Satisfactory for evaluation; transformation zone component PRESENT.   No results found for: "SPECADGYN" Result Date Procedure Results Follow-ups  12/04/2021 Cytology - PAP High risk HPV: Negative Comment: Normal Reference Range Neisseria Gonorrhea - Negative Neisseria Gonorrhea: Negative Chlamydia: Negative Trichomonas: Negative Adequacy: Satisfactory for evaluation; transformation zone component PRESENT. Diagnosis: - Negative for intraepithelial lesion or malignancy (NILM) Comment: Normal Reference Range Trichomonas - Negative Comment: Normal Reference Range HPV - Negative Comment: Normal Reference Ranger Chlamydia - Negative   03/16/2020 Cytology - PAP Adequacy: Satisfactory for evaluation; transformation zone component ABSENT. Diagnosis: - Negative for intraepithelial lesion or malignancy (NILM)   09/21/2019 Cytology - PAP High risk HPV: Positive (A) Adequacy: Satisfactory for evaluation; transformation zone component PRESENT. Diagnosis: -  Atypical squamous cells of undetermined significance (ASC-US) (A) Comment: Normal Reference Range HPV - Negative   12/11/2018 Surgical pathology     11/17/2018 Cytology - PAP Adequacy: Satisfactory for evaluation  endocervical/transformation zone component PRESENT. (A) Diagnosis: ATYPICAL SQUAMOUS CELLS OF UNDETERMINED SIGNIFICANCE (ASC-US). (A) Chlamydia: Negative Neisseria Gonorrhea: Negative HPV: DETECTED (A) Material Submitted: CervicoVaginal Pap [ThinPrep Imaged] (A) CYTOLOGY - PAP: PAP RESULT   04/11/2017 HM PAP SMEAR HM Pap smear: Negative     Screening for MPX risk: Does the patient have an unexplained rash? No Is the patient MSM? No Does the patient endorse multiple sex partners or anonymous sex partners? No Did the patient have close or sexual contact with a person diagnosed with MPX? No Has the patient traveled outside the [place] where MPX is endemic? No Is there a high clinical suspicion for MPX-- evidenced by one of the following No  -Unlikely to be chickenpox  -Lymphadenopathy  -Rash that present in same phase of evolution on any given body part See flowsheet for further details and programmatic requirements.   Immunization history:  Immunization History  Administered Date(s) Administered   Influenza-Unspecified 03/09/2020   MMR 10/08/2020   PFIZER(Purple Top)SARS-COV-2 Vaccination 02/24/2020   PPD Test 01/30/2019, 02/12/2019   Tdap 09/02/2018, 08/10/2020   Varicella 10/11/2018, 02/27/2023     The following portions of the patient's history were reviewed and updated as appropriate: allergies, current medications, past medical history, past social history, past surgical history and problem list.  Objective:  There were no vitals filed for this visit.  Physical Exam  Assessment and Plan:  Ashley Howell is a 33 y.o. female presenting to the [facility] Department for STI screening  Yeast infection Assessment & Plan: Physical and HPI consistent with yeast infection, will treat presumptively with antifungal. Discussed diflucan tablets vs clotrimazole cream, patient prefers diflucan. Rx sent.    Orders: -     Fluconazole; Take 1 tablet (150 mg total) by mouth every 3 (three) days. For two total doses.  Dispense: 2 tablet; Refill: 2  Screening examination for venereal disease -     WET PREP FOR TRICH, YEAST, CLUE -     Syphilis Serology, [place] [facility] -     Chlamydia/Gonorrhea [facility] -     HIV [place] [facility] -     Gonococcus culture  Patient accepted all screenings including oral, vaginal CT/GC and bloodwork for HIV/RPR, and wet prep. Patient meets criteria for HepB screening? No. Ordered? not applicable Patient meets criteria for HepC screening? No. Ordered? not applicable  Treat wet prep per standing order Discussed time line for [facility] results and that patient will be called with positive results and encouraged patient to call if she had not heard in 2 weeks.  Counseled to return or seek care for continued or worsening symptoms Recommended repeat testing in 3 months with positive results. Recommended condom use with all sex for STI prevention.   Patient is currently using [facility] to prevent pregnancy.    No follow-ups on file.  No future appointments.  Clydene Fake, MD

## 2023-06-25 NOTE — Progress Notes (Signed)
 Pt is here for std screening, wet prep results reviewed with pt.  Pt instructed Diflucan sent to pharmacy by provider. Condoms declined. Gaspar Garbe, RN

## 2023-06-27 DIAGNOSIS — B379 Candidiasis, unspecified: Secondary | ICD-10-CM | POA: Insufficient documentation

## 2023-06-27 NOTE — Assessment & Plan Note (Signed)
 Physical and HPI consistent with yeast infection, will treat presumptively with antifungal. Discussed diflucan tablets vs clotrimazole cream, patient prefers diflucan. Rx sent.

## 2023-07-01 LAB — GONOCOCCUS CULTURE

## 2023-08-01 ENCOUNTER — Telehealth: Payer: Self-pay | Admitting: Cardiovascular Disease

## 2023-08-01 NOTE — Telephone Encounter (Signed)
 Called patient, advised that for the last 3-4 days she has been having some coming and going sharp chest pain. She states it only last a few seconds, if she bends over or moves it will improve. Patient states she has been taking Tylenol  Extra Strength and it does help the pain and does not come back all day. It does not worsen with deep breaths or tender to touch. Patient states her BP/HR has been normal- but not sure what the last readings were. Advised patient this did sound musculoskeletal- but I would route a message to MD to review further and call back with other recommendations.   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   Routing to MD for review.

## 2023-08-01 NOTE — Telephone Encounter (Signed)
 Pt c/o of Chest Pain: STAT if active (IN THIS MOMENT) CP, including tightness, pressure, jaw pain, shoulder/upper arm/back pain, SOB, nausea, and vomiting.  1. Are you having CP right now (tightness, pressure, or discomfort)? No, but pt felt it last around 6am this morning there was a sharp pain.   2. Are you experiencing any other symptoms (ex. SOB, nausea, vomiting, sweating)? SOB only when the sharp pain comes.   3. How long have you been experiencing CP? The last 3 to 4 days  4. Is your CP continuous or coming and going? Coming and going  5. Have you taken Nitroglycerin? No  6. If CP returns before callback, please consider calling 911. ?

## 2023-08-02 NOTE — Telephone Encounter (Signed)
 Called patient and notified her of the following from Dr. Gollan.  Does not sound particularly cardiac in etiology  Sounds more musculoskeletal possibly exacerbated by heavy lifting of items on recent trip  Would try ibuprofen , hot pack, resting, no heavy lifting, light stretching  Thx  TGollan   Patient verbalizes understanding.

## 2023-08-03 ENCOUNTER — Emergency Department
Admission: EM | Admit: 2023-08-03 | Discharge: 2023-08-03 | Disposition: A | Discharge status: Home / Self Care | Payer: MEDICAID | Attending: Emergency Medicine | Admitting: Emergency Medicine

## 2023-08-03 ENCOUNTER — Emergency Department: Payer: MEDICAID

## 2023-08-03 ENCOUNTER — Encounter: Payer: Self-pay | Admitting: Medical Oncology

## 2023-08-03 ENCOUNTER — Other Ambulatory Visit: Payer: Self-pay

## 2023-08-03 DIAGNOSIS — R079 Chest pain, unspecified: Secondary | ICD-10-CM | POA: Diagnosis present

## 2023-08-03 DIAGNOSIS — I10 Essential (primary) hypertension: Secondary | ICD-10-CM | POA: Diagnosis not present

## 2023-08-03 DIAGNOSIS — R0602 Shortness of breath: Secondary | ICD-10-CM | POA: Diagnosis not present

## 2023-08-03 LAB — BASIC METABOLIC PANEL WITH GFR
Anion gap: 8 (ref 5–15)
BUN: 12 mg/dL (ref 6–20)
CO2: 23 mmol/L (ref 22–32)
Calcium: 9 mg/dL (ref 8.9–10.3)
Chloride: 105 mmol/L (ref 98–111)
Creatinine, Ser: 0.95 mg/dL (ref 0.44–1.00)
GFR, Estimated: >60 mL/min (ref >60)
Glucose, Bld: 122 mg/dL — ABNORMAL HIGH (ref 70–99)
Potassium: 3.2 mmol/L — ABNORMAL LOW (ref 3.5–5.1)
Sodium: 136 mmol/L (ref 135–145)

## 2023-08-03 LAB — D-DIMER, QUANTITATIVE: D-Dimer, Quant: 0.44 ug{FEU}/mL (ref 0.00–0.50)

## 2023-08-03 LAB — CBC
HCT: 37.9 % (ref 36.0–46.0)
Hemoglobin: 12.3 g/dL (ref 12.0–15.0)
MCH: 25.9 pg — ABNORMAL LOW (ref 26.0–34.0)
MCHC: 32.5 g/dL (ref 30.0–36.0)
MCV: 80 fL (ref 80.0–100.0)
Platelets: 367 10*3/uL (ref 150–400)
RBC: 4.74 MIL/uL (ref 3.87–5.11)
RDW: 14.6 % (ref 11.5–15.5)
WBC: 8.1 10*3/uL (ref 4.0–10.5)
nRBC: 0 % (ref 0.0–0.2)

## 2023-08-03 LAB — TROPONIN I (HIGH SENSITIVITY): Troponin I (High Sensitivity): 3 ng/L (ref <18)

## 2023-08-03 NOTE — ED Provider Notes (Signed)
 [facility] Provider Note    Event Date/Time   First MD Initiated Contact with Patient 08/03/23 1001     (approximate)  History   Chief Complaint: Chest Pain  HPI  Ashley Howell is a 33 y.o. female with a past medical history of anxiety, ASD, hypertension, presents to the emergency department for 3 days of intermittent chest pain.  According to the patient over the last 3 days or so she has been experiencing a tightness sensation to her chest with intermittent sharp pains.  Patient states at times she feels short of breath although denies any currently.  No cough congestion or fever.  No leg pain or swelling.  No abdominal pain.  Patient states a history of chest pain in the past however states she has not had any issues found previously besides an ASD for which she does see a cardiologist but is not had any procedure.  Physical Exam   Triage Vital Signs: ED Triage Vitals  Encounter Vitals Group     BP 08/03/23 0959 118/85     Systolic BP Percentile --      Diastolic BP Percentile --      Pulse Rate 08/03/23 0959 (!) 103     Resp 08/03/23 0959 18     Temp 08/03/23 0959 98.4 F (36.9 C)     Temp Source 08/03/23 0959 Oral     SpO2 08/03/23 0959 98 %     Weight 08/03/23 0956 280 lb (127 kg)     Height 08/03/23 0956 5\' 7"  (1.702 m)     Head Circumference --      Peak Flow --      Pain Score 08/03/23 0956 10     Pain Loc --      Pain Education --      Exclude from Growth Chart --     Most recent vital signs: Vitals:   08/03/23 0959  BP: 118/85  Pulse: (!) 103  Resp: 18  Temp: 98.4 F (36.9 C)  SpO2: 98%    General: Awake, no distress.  CV:  Good peripheral perfusion.  Regular rate and rhythm  Resp:  Normal effort.  Equal breath sounds bilaterally.  Mild chest wall tenderness to palpation. Abd:  No distention.  Soft, nontender.  No rebound or guarding.  Benign abdomen.  ED Results / Procedures / Treatments   EKG  EKG viewed and  interpreted by myself shows sinus tachycardia 104 bpm with a narrow QRS, normal axis, normal intervals, no concerning ST changes.  RADIOLOGY  I have reviewed and interpreted chest x-ray images.  No consolidation seen on my evaluation. Radiology is read the chest x-ray is negative   MEDICATIONS ORDERED IN ED: Medications - No data to display   IMPRESSION / MDM / ASSESSMENT AND PLAN / ED COURSE  I reviewed the triage vital signs and the nursing notes.  Patient's presentation is most consistent with acute presentation with potential threat to life or bodily function.  Patient presents emergency department for chest pain.  Patient states she has had chest pain intermittently over the last 3 days.  States tightness with occasional sharp pains.  Denies any pleuritic nature.  No cough congestion or fever.  Does state at times she feels like the pain shoots to her neck.  The pain is reproducible with central sternal palpation.  No abdominal tenderness.  We will check labs including a CBC chemistry a troponin as well as a D-dimer as a  precaution.  Vital signs are reassuring besides slight tachycardia however the patient does appear somewhat anxious.  Patient's workup is reassuring, CBC is normal, chemistry is reassuring, troponin negative.  D-dimer negative, chest x-ray is clear.  Given the patient's reassuring workup we will discharge the patient home with outpatient follow-up.  Patient agreeable to plan and reassured by today's results.  FINAL CLINICAL IMPRESSION(S) / ED DIAGNOSES   Chest pain   Note:  This document was prepared using Dragon voice recognition software and may include unintentional dictation errors.   Ruth Cove, MD 08/03/23 1147

## 2023-08-03 NOTE — ED Triage Notes (Signed)
 Pt reports central chest pain that feels tight and at times sharp that began 3-4 days ago. Pt reports pain into neck and sob. NAD noted.

## 2023-08-19 ENCOUNTER — Telehealth: Payer: Self-pay

## 2023-08-19 NOTE — Telephone Encounter (Signed)
 Patient states she needs another mammogram order. The one placed in September was cancelled and she is unable to schedule. Advised Lonzell Robin is out the office today but will return tomorrow.

## 2023-08-26 ENCOUNTER — Other Ambulatory Visit: Payer: Self-pay | Admitting: Licensed Practical Nurse

## 2023-08-26 ENCOUNTER — Telehealth: Payer: Self-pay

## 2023-08-26 DIAGNOSIS — N644 Mastodynia: Secondary | ICD-10-CM

## 2023-08-26 NOTE — Telephone Encounter (Signed)
 Called Fayrene back as she called and spoke to front staff about a breast mammogram, Lonzell Robin placed the order and I called to let her know it's been placed. Patient understood.

## 2023-08-29 ENCOUNTER — Ambulatory Visit
Admission: RE | Admit: 2023-08-29 | Discharge: 2023-08-29 | Disposition: A | Discharge status: Home / Self Care | Payer: MEDICAID | Source: Ambulatory Visit | Attending: Licensed Practical Nurse | Admitting: Licensed Practical Nurse

## 2023-08-29 DIAGNOSIS — N644 Mastodynia: Secondary | ICD-10-CM | POA: Insufficient documentation

## 2023-09-30 NOTE — Telephone Encounter (Signed)
 Donelda Burnard CROME, CMA    08/26/23 11:01 AM Note Called Seher back as she called and spoke to front staff about a breast mammogram, Jinnie placed the order and I called to let her know it's been placed. Patient understood.       08/26/23 10:59 AM Donelda Burnard CROME, CMA contacted Jerona Lucie RAMAN

## 2023-10-09 ENCOUNTER — Encounter: Payer: Self-pay | Admitting: Family Medicine

## 2023-10-09 ENCOUNTER — Ambulatory Visit: Payer: MEDICAID | Admitting: Family Medicine

## 2023-10-09 DIAGNOSIS — Z113 Encounter for screening for infections with a predominantly sexual mode of transmission: Secondary | ICD-10-CM

## 2023-10-09 LAB — WET PREP FOR TRICH, YEAST, CLUE
Clue Cell Exam: NEGATIVE
Trichomonas Exam: NEGATIVE
Yeast Exam: NEGATIVE

## 2023-10-09 LAB — HM HIV SCREENING LAB: HM HIV Screening: NEGATIVE

## 2023-10-09 NOTE — Progress Notes (Signed)
 [facility] Department STI clinic [address] [place] [address] Main phone: [phone]  STI screening visit  Subjective:  Ashley Howell is a 33 y.o. female being seen today for an STI screening visit. The patient reports they do have symptoms.  Patient reports that they do not desire a pregnancy in the next year.   They reported they are not interested in discussing contraception today.    No LMP recorded. (Menstrual status: Bilateral Tubal Ligation).  Patient has the following medical conditions:  Patient Active Problem List   Diagnosis Date Noted   Yeast infection 06/27/2023   Iron deficiency anemia    Elevated liver function tests    Acute gallstone pancreatitis 03/27/2021   Calculus of gallbladder without cholecystitis without obstruction    History of cesarean section 10/06/2020   H/o tubal ligation 10/06/2020    ASD (atrial septal defect) 03/16/2020   Bacterial vaginosis 06/25/2019   Dysplasia of cervix, low grade (CIN 1) 12/11/2018   Obesity, Class III, BMI 40-49.9 (morbid obesity) 09/04/2018   Essential hypertension 05/19/2018   PFO (patent foramen ovale) 02/18/2015   History of cardiomegaly 10/28/2014   Chief Complaint  Patient presents with   SEXUALLY TRANSMITTED DISEASE    HPI Patient reports to clinic for STI Testing- states the condom broke after sex with a new partner. Has 1 week of vaginal discharge and discomfort  Does the patient using douching products? No  See flowsheet for further details and programmatic requirements Hyperlink available at the top of the signed note in blue.  Flow sheet content below:  Pregnancy Intention Screening Does the patient want to become pregnant in the next year?: No Does the patient's partner want to become pregnant in the next year?: No Would the patient like to discuss contraceptive options today?: No All Patients Anyone smoke around pt and/or pt's children?: Yes Anyone  smoke inside pt's house?: No Anyone smoke inside car?: No Anyone smoke inside the workplace?: No Reason For STD Screen STD Screening: Has symptoms Have you ever had an STD?: No History of Antibiotic use in the past 2 weeks?: No STD Symptoms Discharge: Yes Risk Factors for Hep B Household, sexual, or needle sharing contact of a person infected with Hep B: No Sexual contact with a person who uses drugs not as prescribed?: No Currently or Ever used drugs not as prescribed: No HIV Positive: No PRep Patient: No Men who have sex with men: No Have Hepatitis C: No History of Incarceration: No History of Homeslessness?: No Anal sex following anal drug use?: No Risk Factors for Hep C Currently using drugs not as prescribed: No Sexual partner(s) currently using drugs as not prescribed: No History of drug use: No HIV Positive: No People with a history of incarceration: No People born between the years of 38 and 51: No Advise Advised client to quit or stay quit. : Yes (vapes- declines resources) Abuse History Has patient ever been abused physically?: No Has patient ever been abused sexually?: No Does patient feel they have a problem with Anxiety?: No Does patient feel they have a problem with Depression?: No Counseling Patient counseled to use condoms with all sex: Condoms declined RTC in 2-3 weeks for test results: Yes Clinic will call if test results abnormal before test result appt.: Yes Test results given to patient Patient counseled to use condoms with all sex: Condoms declined   Screening for MPX risk: Does the patient have an unexplained rash? No Is the  patient MSM? No Does the patient endorse multiple sex partners or anonymous sex partners? No Did the patient have close or sexual contact with a person diagnosed with MPX? No Has the patient traveled outside the [place]  where MPX is endemic? No Is there a high clinical suspicion for MPX-- evidenced by one of the following  No  -Unlikely to be chickenpox  -Lymphadenopathy  -Rash that present in same phase of evolution on any given body part  Screenings: Last HIV test per patient/review of record was  Lab Results  Component Value Date   HMHIVSCREEN Negative - Validated 06/25/2023    Lab Results  Component Value Date   HIV Non Reactive 12/25/2022     Last HEPC test per patient/review of record was No results found for: HMHEPCSCREEN No components found for: HEPC   Last HEPB test per patient/review of record was No components found for: HMHEPBSCREEN   Patient reports last pap was:   No results found for: SPECADGYN Result Date Procedure Results Follow-ups  12/04/2021 Cytology - PAP High risk HPV: Negative Neisseria Gonorrhea: Negative Chlamydia: Negative Trichomonas: Negative Adequacy: Satisfactory for evaluation; transformation zone component PRESENT. Diagnosis: - Negative for intraepithelial lesion or malignancy (NILM) Comment: Normal Reference Range Trichomonas - Negative Comment: Normal Reference Range HPV - Negative Comment: Normal Reference Ranger Chlamydia - Negative Comment: Normal Reference Range Neisseria Gonorrhea - Negative   03/16/2020 Cytology - PAP Adequacy: Satisfactory for evaluation; transformation zone component ABSENT. Diagnosis: - Negative for intraepithelial lesion or malignancy (NILM)   09/21/2019 Cytology - PAP High risk HPV: Positive (A) Adequacy: Satisfactory for evaluation; transformation zone component PRESENT. Diagnosis: - Atypical squamous cells of undetermined significance (ASC-US ) (A) Comment: Normal Reference Range HPV - Negative   12/11/2018 Surgical pathology    11/17/2018 Cytology - PAP Adequacy: Satisfactory for evaluation  endocervical/transformation zone component PRESENT. (A) Diagnosis: ATYPICAL SQUAMOUS CELLS OF UNDETERMINED SIGNIFICANCE (ASC-US ). (A) Chlamydia: Negative Neisseria Gonorrhea: Negative HPV: DETECTED (A) Material Submitted: CervicoVaginal  Pap [ThinPrep Imaged] (A) CYTOLOGY - PAP: PAP RESULT   04/11/2017 HM PAP SMEAR HM Pap smear: Negative     Immunization history:  Immunization History  Administered Date(s) Administered   Influenza-Unspecified 03/09/2020   MMR 10/08/2020   PFIZER(Purple Top)SARS-COV-2 Vaccination 02/24/2020   PPD Test 01/30/2019, 02/12/2019   Tdap 09/02/2018, 08/10/2020   Varicella 10/11/2018, 02/27/2023    The following portions of the patient's history were reviewed and updated as appropriate: allergies, current medications, past medical history, past social history, past surgical history and problem list.  Objective:  There were no vitals filed for this visit.  Physical Exam Vitals and nursing note reviewed. Exam conducted with a chaperone present Brett Orange CNA).  Constitutional:      Appearance: Normal appearance.  HENT:     Head: Normocephalic and atraumatic.     Mouth/Throat:     Mouth: Mucous membranes are moist.     Pharynx: Oropharynx is clear. No oropharyngeal exudate or posterior oropharyngeal erythema.  Pulmonary:     Effort: Pulmonary effort is normal.  Abdominal:     General: Abdomen is flat.     Palpations: There is no mass.     Tenderness: There is no abdominal tenderness. There is no rebound.  Genitourinary:    General: Normal vulva.     Exam position: Lithotomy position.     Pubic Area: No rash or pubic lice.      Tanner stage (genital): 5.     Labia:  Right: No rash or lesion.        Left: No rash or lesion.      Vagina: Vaginal discharge present. No erythema, bleeding or lesions.     Cervix: No cervical motion tenderness, discharge, friability, lesion or erythema.     Uterus: Normal.      Adnexa: Right adnexa normal and left adnexa normal.     Rectum: Normal.     Comments: pH = 5  Clear discharge present  Lymphadenopathy:     Head:     Right side of head: No preauricular or posterior auricular adenopathy.     Left side of head: No preauricular or  posterior auricular adenopathy.     Cervical: No cervical adenopathy.     Upper Body:     Right upper body: No supraclavicular, axillary or epitrochlear adenopathy.     Left upper body: No supraclavicular, axillary or epitrochlear adenopathy.     Lower Body: No right inguinal adenopathy. No left inguinal adenopathy.  Skin:    General: Skin is warm and dry.     Findings: No rash.  Neurological:     Mental Status: She is alert and oriented to person, place, and time.      Assessment and Plan:  SHATISHA FALTER is a 33 y.o. female presenting to the [facility] Department for STI screening  1. Screening for venereal disease (Primary)  - Chlamydia/Gonorrhea [facility] - HIV [place] [facility] - Syphilis Serology, [facility] - WET PREP FOR TRICH, YEAST, CLUE   Patient accepted the following screenings: vaginal CT/GC swab, vaginal wet prep, HIV, and RPR Patient meets criteria for HepB screening? No. Ordered? not applicable Patient meets criteria for HepC screening? No. Ordered? not applicable  Treat wet prep per standing order Discussed time line for [facility] results and that patient will be called with positive results and encouraged patient to call if she had not heard in 2 weeks.  Counseled to return or seek care for continued or worsening symptoms Recommended repeat testing in 3 months with positive results. Recommended condom use with all sex for STI prevention.   Patient is currently using Sterilization by Laparoscopy to prevent pregnancy.    Return if symptoms worsen or fail to improve, for STI screening.  No future appointments.  Verneta Bers, [place]

## 2023-10-14 ENCOUNTER — Ambulatory Visit: Payer: Self-pay | Admitting: Family Medicine

## 2023-10-24 ENCOUNTER — Encounter: Payer: Self-pay | Admitting: Family Medicine

## 2023-12-12 ENCOUNTER — Ambulatory Visit: Payer: MEDICAID

## 2023-12-17 ENCOUNTER — Ambulatory Visit: Payer: MEDICAID

## 2024-06-01 ENCOUNTER — Ambulatory Visit: Payer: MEDICAID | Admitting: Cardiovascular Disease
